# Patient Record
Sex: Female | Born: 1990 | Race: White | Hispanic: No | Marital: Married | Smoking: Current every day smoker
Health system: Southern US, Community
[De-identification: ages and names within clinical notes are randomized; demographics above are authoritative.]

## PROBLEM LIST (undated history)

## (undated) ENCOUNTER — Inpatient Hospital Stay (HOSPITAL_COMMUNITY): Payer: Self-pay

## (undated) DIAGNOSIS — F419 Anxiety disorder, unspecified: Secondary | ICD-10-CM

## (undated) DIAGNOSIS — A609 Anogenital herpesviral infection, unspecified: Secondary | ICD-10-CM

## (undated) DIAGNOSIS — N809 Endometriosis, unspecified: Secondary | ICD-10-CM

## (undated) DIAGNOSIS — F32A Depression, unspecified: Secondary | ICD-10-CM

## (undated) DIAGNOSIS — N390 Urinary tract infection, site not specified: Secondary | ICD-10-CM

## (undated) DIAGNOSIS — F4310 Post-traumatic stress disorder, unspecified: Secondary | ICD-10-CM

## (undated) DIAGNOSIS — F329 Major depressive disorder, single episode, unspecified: Secondary | ICD-10-CM

## (undated) DIAGNOSIS — F319 Bipolar disorder, unspecified: Secondary | ICD-10-CM

## (undated) HISTORY — PX: LAPAROSCOPY: SHX197

## (undated) HISTORY — PX: COLONOSCOPY: SHX174

## (undated) HISTORY — PX: DRUG INDUCED ENDOSCOPY: SHX6808

---

## 2012-11-05 HISTORY — PX: DIAGNOSTIC LAPAROSCOPY: SUR761

## 2013-07-30 ENCOUNTER — Emergency Department (HOSPITAL_COMMUNITY)
Admission: EM | Admit: 2013-07-30 | Discharge: 2013-07-30 | Disposition: A | Discharge status: Home / Self Care | Payer: BC Managed Care – PPO | Attending: Emergency Medicine | Admitting: Emergency Medicine

## 2013-07-30 ENCOUNTER — Encounter (HOSPITAL_COMMUNITY): Payer: Self-pay

## 2013-07-30 DIAGNOSIS — Z792 Long term (current) use of antibiotics: Secondary | ICD-10-CM | POA: Insufficient documentation

## 2013-07-30 DIAGNOSIS — Z79899 Other long term (current) drug therapy: Secondary | ICD-10-CM | POA: Insufficient documentation

## 2013-07-30 DIAGNOSIS — F17200 Nicotine dependence, unspecified, uncomplicated: Secondary | ICD-10-CM | POA: Insufficient documentation

## 2013-07-30 DIAGNOSIS — Z3202 Encounter for pregnancy test, result negative: Secondary | ICD-10-CM | POA: Insufficient documentation

## 2013-07-30 DIAGNOSIS — N390 Urinary tract infection, site not specified: Secondary | ICD-10-CM | POA: Insufficient documentation

## 2013-07-30 DIAGNOSIS — Z8742 Personal history of other diseases of the female genital tract: Secondary | ICD-10-CM | POA: Insufficient documentation

## 2013-07-30 HISTORY — DX: Urinary tract infection, site not specified: N39.0

## 2013-07-30 HISTORY — DX: Endometriosis, unspecified: N80.9

## 2013-07-30 LAB — BASIC METABOLIC PANEL
Calcium: 9.1 mg/dL (ref 8.4–10.5)
Creatinine, Ser: 0.74 mg/dL (ref 0.50–1.10)
GFR calc Af Amer: >90 mL/min (ref >90)
GFR calc non Af Amer: >90 mL/min (ref >90)

## 2013-07-30 LAB — URINALYSIS, ROUTINE W REFLEX MICROSCOPIC
Bilirubin Urine: NEGATIVE
Glucose, UA: NEGATIVE mg/dL
Hgb urine dipstick: NEGATIVE
Protein, ur: NEGATIVE mg/dL
Specific Gravity, Urine: 1.024 (ref 1.005–1.030)
Urobilinogen, UA: 0.2 mg/dL (ref 0.0–1.0)

## 2013-07-30 LAB — URINE MICROSCOPIC-ADD ON

## 2013-07-30 LAB — CBC WITH DIFFERENTIAL/PLATELET
Basophils Absolute: 0 10*3/uL (ref 0.0–0.1)
Basophils Relative: 1 % (ref 0–1)
Eosinophils Absolute: 0.5 10*3/uL (ref 0.0–0.7)
Eosinophils Relative: 7 % — ABNORMAL HIGH (ref 0–5)
HCT: 39.4 % (ref 36.0–46.0)
MCH: 30.5 pg (ref 26.0–34.0)
MCHC: 35 g/dL (ref 30.0–36.0)
MCV: 87 fL (ref 78.0–100.0)
Monocytes Absolute: 0.5 10*3/uL (ref 0.1–1.0)
RDW: 12 % (ref 11.5–15.5)

## 2013-07-30 LAB — POCT PREGNANCY, URINE: Preg Test, Ur: NEGATIVE

## 2013-07-30 MED ORDER — PHENAZOPYRIDINE HCL 200 MG PO TABS: 200.0000 mg | ORAL_TABLET | Freq: Three times a day (TID) | ORAL | Status: DC

## 2013-07-30 NOTE — Progress Notes (Signed)
   CARE MANAGEMENT ED NOTE 07/30/2013  Patient:  Karen [name],Karen [name]   Account Number:  [id number]  Date Initiated:  07/30/2013  Documentation initiated by:  Edd Arbour  Subjective/Objective Assessment:     Subjective/Objective Assessment Detail:     Action/Plan:   Action/Plan Detail:   [facility] ED CM noted pt without a pcp listed in EPIC.  CM spoke with pt who confirms she has bcbs coverage but no pcp Stated she recently moved to the area [facility] ED CM spoke with pt on how to obtain an in network pcp with insurance coverage via the   Anticipated DC Date:  07/30/2013     Status Recommendation to Physician:   Result of Recommendation:    Other ED Services  Consult Working Plan    DC Planning Services  Other  PCP issues  Outpatient Services - Pt will follow up    Choice offered to / List presented to:         Status of service:  Completed, signed off  ED Comments:   ED Comments Detail:  [facility] ED CM noted pt without a pcp listed in EPIC.  CM spoke with pt who confirms she has bcbs coverage but no pcp Stated she recently moved to the area [facility] ED CM spoke with pt on how to obtain an in network pcp with insurance coverage via the customer service number or web site Cm reviewed ED level of care for crisis/emergent services and community pcp level of care to manage continuous or chronic medical concerns.  The pt voiced understanding CM encouraged pt and discussed pt's responsibility to verify with pt's insurance carrier that any recommended medical provider offered by any emergency room or a hospital provider is within the carrier's network. The pt voiced understanding

## 2013-07-30 NOTE — ED Notes (Signed)
Negative CVA tenderness, pain bilateral flank. The abnormal lab that prompted urgent care to refer pt to ED was concerning for severe infection. Hematuria present as well.

## 2013-07-30 NOTE — ED Provider Notes (Signed)
CSN: [id number]     Arrival date & time 07/30/13  1135 History   First MD Initiated Contact with Patient 07/30/13 1220     Chief Complaint  Patient presents with  . Urinary Tract Infection   (Consider location/radiation/quality/duration/timing/severity/associated sxs/prior Treatment) HPI Comments: Patient presents with a chief complaint of dysuria and increased urinary frequency that has been present for the past 2 weeks.  Dysuria gradually worsening.  She was seen at Urgent Care yesterday and was diagnosed with a UTI.  She was given a IM injection of Rocephin and discharged home with a prescription for Bactrim DS, which she reports that she is taking.  She presents today because her symptoms have not improved after being on the Bactrim DS for one day.  She reports that she was told at Urgent Care to come to the ED if no improvement in symptoms.  She also reports that she was called by the staff of Urgent Care today and was told that her labs were abnormal.  She is not sure what labs were abnormal.  Patient denies fever, chills, flank pain, nausea, vomiting, or vaginal discharge.  She reports that she is not sexually active.  The history is provided by the patient.    Past Medical History  Diagnosis Date  . Chronic UTI   . Endometriosis    History reviewed. No pertinent past surgical history. History reviewed. No pertinent family history. History  Substance Use Topics  . Smoking status: Current Every Day Smoker -- 1.00 packs/day    Types: Cigarettes  . Smokeless tobacco: Never Used  . Alcohol Use: No   OB History   Grav Para Term Preterm Abortions TAB SAB Ect Mult Living                 Review of Systems  Genitourinary: Positive for dysuria and frequency.  All other systems reviewed and are negative.    Allergies  Ciprofloxacin  Home Medications   Current Outpatient Rx  Name  Route  Sig  Dispense  Refill  . norethindrone (AYGESTIN) 5 MG tablet   Oral   Take 5 mg by  mouth daily.         Marland Kitchen sulfamethoxazole-trimethoprim (BACTRIM DS) 800-160 MG per tablet   Oral   Take 1 tablet by mouth 2 (two) times daily.          BP 119/65  Pulse 81  Temp(Src) 97.9 F (36.6 C) (Oral)  Resp 16  SpO2 100% Physical Exam  Nursing note and vitals reviewed. Constitutional: She appears well-developed and well-nourished.  HENT:  Head: Normocephalic and atraumatic.  Mouth/Throat: Oropharynx is clear and moist.  Neck: Normal range of motion. Neck supple.  Cardiovascular: Normal rate, regular rhythm and normal heart sounds.   Pulmonary/Chest: Effort normal and breath sounds normal.  Abdominal: Soft. Normal appearance and bowel sounds are normal. She exhibits no distension and no mass. There is no tenderness. There is no rebound, no guarding and no CVA tenderness.  Neurological: She is alert.  Skin: Skin is warm and dry.  Psychiatric: She has a normal mood and affect.    ED Course  Procedures (including critical care time) Labs Review Labs Reviewed  URINALYSIS, ROUTINE W REFLEX MICROSCOPIC  CBC WITH DIFFERENTIAL  BASIC METABOLIC PANEL  POCT PREGNANCY, URINE   Imaging Review No results found.  MDM  No diagnosis found. Patient presenting with a chief complaint of dysuria and increased urinary frequency.  UA showing UTI.  Urine sent for culture.  Urine pregnancy negative.  Patient currently on Bactrim DS.  Patient is afebrile with no CVA tenderness.  Therefore, doubt Pyelonephritis.  Labs unremarkable.  Feel that the patient is stable for discharge.  Return precautions given.     Pascal Lux [place], PA-C 07/30/13 2146

## 2013-07-30 NOTE — ED Notes (Signed)
Pt sts she was diagnosed with a UTI and flank pain x 2 weeks ago.  Pain score 7/10.  Sts she has been seen at an Urgent care twice for same complaint.  Sts she has taken amoxicillin and was given a Rocephin injection yesterday.  Urgent care followed up today, informed her of an abnormal lab value and directed her to come to the ED, if not feeling better.

## 2013-07-31 LAB — URINE CULTURE

## 2013-08-01 NOTE — ED Provider Notes (Signed)
Medical screening examination/treatment/procedure(s) were performed by non-physician practitioner and as supervising physician I was immediately available for consultation/collaboration.   Dagmar Hait, MD 08/01/13 [phone]

## 2013-11-05 HISTORY — PX: TONSILLECTOMY: SUR1361

## 2013-12-11 ENCOUNTER — Encounter (HOSPITAL_COMMUNITY): Payer: Self-pay | Admitting: Emergency Medicine

## 2013-12-11 ENCOUNTER — Emergency Department (HOSPITAL_COMMUNITY)
Admission: EM | Admit: 2013-12-11 | Discharge: 2013-12-11 | Disposition: A | Discharge status: Home / Self Care | Payer: BC Managed Care – PPO | Source: Home / Self Care | Attending: Family Medicine | Admitting: Family Medicine

## 2013-12-11 DIAGNOSIS — E860 Dehydration: Secondary | ICD-10-CM

## 2013-12-11 DIAGNOSIS — R6889 Other general symptoms and signs: Secondary | ICD-10-CM

## 2013-12-11 DIAGNOSIS — R1110 Vomiting, unspecified: Secondary | ICD-10-CM

## 2013-12-11 DIAGNOSIS — R197 Diarrhea, unspecified: Secondary | ICD-10-CM

## 2013-12-11 LAB — POCT URINALYSIS DIP (DEVICE)
Bilirubin Urine: NEGATIVE
Glucose, UA: NEGATIVE mg/dL
Ketones, ur: NEGATIVE mg/dL
Leukocytes, UA: NEGATIVE
Nitrite: NEGATIVE
PROTEIN: NEGATIVE mg/dL
Urobilinogen, UA: 0.2 mg/dL (ref 0.0–1.0)
pH: 6.5 (ref 5.0–8.0)

## 2013-12-11 MED ORDER — SODIUM CHLORIDE 0.9 % IV BOLUS (SEPSIS)
1000.0000 mL | Freq: Once | INTRAVENOUS | Status: AC
  Administered 2013-12-11: 1000 mL via INTRAVENOUS

## 2013-12-11 MED ORDER — ONDANSETRON 4 MG PO TBDP: 4.0000 mg | ORAL_TABLET | Freq: Three times a day (TID) | ORAL | Status: DC | PRN

## 2013-12-11 MED ORDER — ONDANSETRON HCL 4 MG/2ML IJ SOLN
4.0000 mg | Freq: Four times a day (QID) | INTRAMUSCULAR | Status: DC | PRN
  Administered 2013-12-11: 4 mg via INTRAVENOUS

## 2013-12-11 MED ORDER — OSELTAMIVIR PHOSPHATE 75 MG PO CAPS: 75.0000 mg | ORAL_CAPSULE | Freq: Two times a day (BID) | ORAL | Status: DC

## 2013-12-11 MED ORDER — ONDANSETRON HCL 4 MG/2ML IJ SOLN
INTRAMUSCULAR | Status: AC
  Filled 2013-12-11: qty 2

## 2013-12-11 NOTE — Discharge Instructions (Signed)
Take Tamiflu as directed. Rest, drink plenty of liquids and use Zofran as directed for nausea. If you feel like eating keep it very bland (BRAT=Bananas, Rice, Applesauce and Toast). See other suggestions below.   Influenza, Adult Influenza (flu) is an infection in the mouth, nose, and throat (respiratory tract) caused by a virus. The flu can make you feel very ill. Influenza spreads easily from person to person (contagious).  HOME CARE   Only take medicines as told by your doctor.  Use a cool mist humidifier to make breathing easier.  Get plenty of rest until your fever goes away. This usually takes 3 to 4 days.  Drink enough fluids to keep your pee (urine) clear or pale yellow.  Cover your mouth and nose when you cough or sneeze.  Wash your hands well to avoid spreading the flu.  Stay home from work or school until your fever has been gone for at least 1 full day.  Get a flu shot every year. GET HELP RIGHT AWAY IF:   You have trouble breathing or feel short of breath.  Your skin or nails turn blue.  You have severe neck pain or stiffness.  You have a severe headache, facial pain, or earache.  Your fever gets worse or keeps coming back.  You feel sick to your stomach (nauseous), throw up (vomit), or have watery poop (diarrhea).  You have chest pain.  You have a deep cough that gets worse, or you cough up more thick spit (mucus). MAKE SURE YOU:   Understand these instructions.  Will watch your condition.  Will get help right away if you are not doing well or get worse. Document Released: 07/31/2008 Document Revised: 04/22/2012 Document Reviewed: 01/21/2012 [facility] Patient Information 2014 [place], [place].  Nausea and Vomiting Nausea means you feel sick to your stomach. Throwing up (vomiting) is a reflex where stomach contents come out of your mouth. HOME CARE   Take medicine as told by your doctor.  Do not force yourself to eat. However, you do need to drink  fluids.  If you feel like eating, eat a normal diet as told by your doctor.  Eat rice, wheat, potatoes, bread, lean meats, yogurt, fruits, and vegetables.  Avoid high-fat foods.  Drink enough fluids to keep your pee (urine) clear or pale yellow.  Ask your doctor how to replace body fluid losses (rehydrate). Signs of body fluid loss (dehydration) include:  Feeling very thirsty.  Dry lips and mouth.  Feeling dizzy.  Dark pee.  Peeing less than normal.  Feeling confused.  Fast breathing or heart rate. GET HELP RIGHT AWAY IF:   You have blood in your throw up.  You have black or bloody poop (stool).  You have a bad headache or stiff neck.  You feel confused.  You have bad belly (abdominal) pain.  You have chest pain or trouble breathing.  You do not pee at least once every 8 hours.  You have cold, clammy skin.  You keep throwing up after 24 to 48 hours.  You have a fever. MAKE SURE YOU:   Understand these instructions.  Will watch your condition.  Will get help right away if you are not doing well or get worse. Document Released: 04/09/2008 Document Revised: 01/14/2012 Document Reviewed: 03/23/2011 [facility] Patient Information 2014 [place], [place].  Dehydration, Adult Dehydration means your body does not have as much fluid as it needs. Your kidneys, brain, and heart will not work properly without the right amount of  fluids and salt.  HOME CARE  Ask your doctor how to replace body fluid losses (rehydrate).  Drink enough fluids to keep your pee (urine) clear or pale yellow.  Drink small amounts of fluids often if you feel sick to your stomach (nauseous) or throw up (vomit).  Eat like you normally do.  Avoid:  Foods or drinks high in sugar.  Bubbly (carbonated) drinks.  Juice.  Very hot or cold fluids.  Drinks with caffeine.  Fatty, greasy foods.  Alcohol.  Tobacco.  Eating too much.  Gelatin desserts.  Wash your hands to avoid  spreading germs (bacteria, viruses).  Only take medicine as told by your doctor.  Keep all doctor visits as told. GET HELP RIGHT AWAY IF:   You cannot drink something without throwing up.  You get worse even with treatment.  Your vomit has blood in it or looks greenish.  Your poop (stool) has blood in it or looks black and tarry.  You have not peed in 6 to 8 hours.  You pee a small amount of very dark pee.  You have a fever.  You pass out (faint).  You have belly (abdominal) pain that gets worse or stays in one spot (localizes).  You have a rash, stiff neck, or bad headache.  You get easily annoyed, sleepy, or are hard to wake up.  You feel weak, dizzy, or very thirsty. MAKE SURE YOU:   Understand these instructions.  Will watch your condition.  Will get help right away if you are not doing well or get worse. Document Released: 08/18/2009 Document Revised: 01/14/2012 Document Reviewed: 06/11/2011 [facility] Patient Information 2014 [place], [place].  Diarrhea Diarrhea is watery poop (stool). It can make you feel weak, tired, thirsty, or give you a dry mouth (signs of dehydration). Watery poop is a sign of another problem, most often an infection. It often lasts 2 3 days. It can last longer if it is a sign of something serious. Take care of yourself as told by your doctor. HOME CARE   Drink 1 cup (8 ounces) of fluid each time you have watery poop.  Do not drink the following fluids:  Those that contain simple sugars (fructose, glucose, galactose, lactose, sucrose, maltose).  Sports drinks.  Fruit juices.  Whole milk products.  Sodas.  Drinks with caffeine (coffee, tea, soda) or alcohol.  Oral rehydration solution may be used if the doctor says it is okay. You may make your own solution. Follow this recipe:    teaspoon table salt.   teaspoon baking soda.   teaspoon salt substitute containing potassium chloride.  1 tablespoons sugar.  1 liter (34  ounces) of water.  Avoid the following foods:  High fiber foods, such as raw fruits and vegetables.  Nuts, seeds, and whole grain breads and cereals.   Those that are sweetened with sugar alcohols (xylitol, sorbitol, mannitol).  Try eating the following foods:  Starchy foods, such as rice, toast, pasta, low-sugar cereal, oatmeal, baked potatoes, crackers, and bagels.  Bananas.  Applesauce.  Eat probiotic-rich foods, such as yogurt and milk products that are fermented.  Wash your hands well after each time you have watery poop.  Only take medicine as told by your doctor.  Take a warm bath to help lessen burning or pain from having watery poop. GET HELP RIGHT AWAY IF:   You cannot drink fluids without throwing up (vomiting).  You keep throwing up.  You have blood in your poop, or your poop looks  black and tarry.  You do not pee (urinate) in 6 8 hours, or there is only a small amount of very dark pee.  You have belly (abdominal) pain that gets worse or stays in the same spot (localizes).  You are weak, dizzy, confused, or lightheaded.  You have a very bad headache.  Your watery poop gets worse or does not get better.  You have a fever or lasting symptoms for more than 2 3 days.  You have a fever and your symptoms suddenly get worse. MAKE SURE YOU:   Understand these instructions.  Will watch your condition.  Will get help right away if you are not doing well or get worse. Document Released: 04/09/2008 Document Revised: 07/16/2012 Document Reviewed: 06/29/2012 [facility] Patient Information 2014 [place], [place].  Diet for Diarrhea, Adult Frequent, runny stools (diarrhea) may be caused or worsened by food or drink. Diarrhea may be relieved by changing your diet. Since diarrhea can last up to 7 days, it is easy for you to lose too much fluid from the body and become dehydrated. Fluids that are lost need to be replaced. Along with a modified diet, make sure you drink  enough fluids to keep your urine clear or pale yellow. DIET INSTRUCTIONS  Ensure adequate fluid intake (hydration): have 1 cup (8 oz) of fluid for each diarrhea episode. Avoid fluids that contain simple sugars or sports drinks, fruit juices, whole milk products, and sodas. Your urine should be clear or pale yellow if you are drinking enough fluids. Hydrate with an oral rehydration solution that you can purchase at pharmacies, retail stores, and online. You can prepare an oral rehydration solution at home by mixing the following ingredients together:    tsp table salt.   tsp baking soda.   tsp salt substitute containing potassium chloride.  1  tablespoons sugar.  1 L (34 oz) of water.  Certain foods and beverages may increase the speed at which food moves through the gastrointestinal (GI) tract. These foods and beverages should be avoided and include:  Caffeinated and alcoholic beverages.  High-fiber foods, such as raw fruits and vegetables, nuts, seeds, and whole grain breads and cereals.  Foods and beverages sweetened with sugar alcohols, such as xylitol, sorbitol, and mannitol.  Some foods may be well tolerated and may help thicken stool including:  Starchy foods, such as rice, toast, pasta, low-sugar cereal, oatmeal, grits, baked potatoes, crackers, and bagels.   Bananas.   Applesauce.  Add probiotic-rich foods to help increase healthy bacteria in the GI tract, such as yogurt and fermented milk products. RECOMMENDED FOODS AND BEVERAGES Starches Choose foods with less than 2 g of fiber per serving.  Recommended:  White, [place], and pita breads, plain rolls, buns, bagels. Plain muffins, matzo. Soda, saltine, or graham crackers. Pretzels, melba toast, zwieback. Cooked cereals made with water: cornmeal, farina, cream cereals. Dry cereals: refined corn, wheat, rice. Potatoes prepared any way without skins, refined macaroni, spaghetti, noodles, refined rice.  Avoid:  Bread,  rolls, or crackers made with whole wheat, multi-grains, rye, bran seeds, nuts, or coconut. Corn tortillas or taco shells. Cereals containing whole grains, multi-grains, bran, coconut, nuts, raisins. Cooked or dry oatmeal. Coarse wheat cereals, granola. Cereals advertised as "high-fiber." Potato skins. Whole grain pasta, wild or brown rice. Popcorn. Sweet potatoes, yams. Sweet rolls, doughnuts, waffles, pancakes, sweet breads. Vegetables  Recommended: Strained tomato and vegetable juices. Most well-cooked and canned vegetables without seeds. Fresh: Tender lettuce, cucumber without the skin, cabbage, spinach, bean sprouts.  Avoid: Fresh, cooked, or canned: Artichokes, baked beans, beet greens, broccoli, Brussels sprouts, corn, kale, legumes, peas, sweet potatoes. Cooked: Green or red cabbage, spinach. Avoid large servings of any vegetables because vegetables shrink when cooked, and they contain more fiber per serving than fresh vegetables. Fruit  Recommended: Cooked or canned: Apricots, applesauce, cantaloupe, cherries, fruit cocktail, grapefruit, grapes, kiwi, mandarin oranges, peaches, pears, plums, watermelon. Fresh: Apples without skin, ripe banana, grapes, cantaloupe, cherries, grapefruit, peaches, oranges, plums. Keep servings limited to  cup or 1 piece.  Avoid: Fresh: Apples with skin, apricots, mangoes, pears, raspberries, strawberries. Prune juice, stewed or dried prunes. Dried fruits, raisins, dates. Large servings of all fresh fruits. Protein  Recommended: Ground or well-cooked tender beef, ham, veal, lamb, pork, or poultry. Eggs. Fish, oysters, shrimp, lobster, other seafoods. Liver, organ meats.  Avoid: Tough, fibrous meats with gristle. Peanut butter, smooth or chunky. Cheese, nuts, seeds, legumes, dried peas, beans, lentils. Dairy  Recommended: Yogurt, lactose-free milk, kefir, drinkable yogurt, buttermilk, soy milk, or plain hard cheese.  Avoid: Milk, chocolate milk, beverages made  with milk, such as milkshakes. Soups  Recommended: Bouillon, broth, or soups made from allowed foods. Any strained soup.  Avoid: Soups made from vegetables that are not allowed, cream or milk-based soups. Desserts and Sweets  Recommended: Sugar-free gelatin, sugar-free frozen ice pops made without sugar alcohol.  Avoid: Plain cakes and cookies, pie made with fruit, pudding, custard, cream pie. Gelatin, fruit, ice, sherbet, frozen ice pops. Ice cream, ice milk without nuts. Plain hard candy, honey, jelly, molasses, syrup, sugar, chocolate syrup, gumdrops, marshmallows. Fats and Oils  Recommended: Limit fats to less than 8 tsp per day.  Avoid: Seeds, nuts, olives, avocados. Margarine, butter, cream, mayonnaise, salad oils, plain salad dressings. Plain gravy, crisp bacon without rind. Beverages  Recommended: Water, decaffeinated teas, oral rehydration solutions, sugar-free beverages not sweetened with sugar alcohols.  Avoid: Fruit juices, caffeinated beverages (coffee, tea, soda), alcohol, sports drinks, or lemon-lime soda. Condiments  Recommended: Ketchup, mustard, horseradish, vinegar, cocoa powder. Spices in moderation: allspice, basil, bay leaves, celery powder or leaves, cinnamon, cumin powder, curry powder, ginger, mace, marjoram, onion or garlic powder, oregano, paprika, parsley flakes, ground pepper, rosemary, sage, savory, tarragon, thyme, turmeric.  Avoid: Coconut, honey. Document Released: 01/12/2004 Document Revised: 07/16/2012 Document Reviewed: 03/07/2012 [facility] Patient Information 2014 [place].

## 2013-12-11 NOTE — ED Notes (Signed)
Works as a [employer] manager, w the public a lot. Was reportedly sent home from work yesterday because she could not keep down anything, and has had flu like symptoms. NAD at present

## 2013-12-11 NOTE — ED Provider Notes (Signed)
CSN: [id number]     Arrival date & time 12/11/13  1210 History   First MD Initiated Contact with Patient 12/11/13 1422     Chief Complaint  Patient presents with  . Influenza   Patient is a 23 y.o. female presenting with flu symptoms. The history is provided by the patient.  Influenza Presenting symptoms: cough, diarrhea, fatigue, headache, myalgias, nausea, sore throat and vomiting   Presenting symptoms: no fever, no rhinorrhea and no shortness of breath   Severity:  Moderate Onset quality:  Sudden Progression:  Worsening Chronicity:  New Relieved by:  Nothing Ineffective treatments:  OTC medications Associated symptoms: chills and nasal congestion   Associated symptoms: no decrease in physical activity, no ear pain, no mental status change, no neck stiffness and no witnessed syncope   Risk factors: sick contacts   Risk factors: not elderly, no diabetes problem and no kidney disease   Pt states she began to feel bad Wednesday afternoon w/ bodyaches and general malaise. Her symptoms worsened and she became very nauseated and started having multiple episodes of V/D. Reports > 10 episodes of vomiting and 2 episodes of diarrhea in the last 24 hours. She has also developed harsh dry cough, and headaches. She also reports mild sinus congestion and throat irritation from coughing and is now somewhat hoarse. She has had no known fever though has had chills. States several people where she works have had the flu. Pt has not had the flu vaccine this year.   Past Medical History  Diagnosis Date  . Chronic UTI   . Endometriosis    History reviewed. No pertinent past surgical history. History reviewed. No pertinent family history. History  Substance Use Topics  . Smoking status: Current Every Day Smoker -- 1.00 packs/day    Types: Cigarettes  . Smokeless tobacco: Never Used  . Alcohol Use: No   OB History   Grav Para Term Preterm Abortions TAB SAB Ect Mult Living                 Review of  Systems  Constitutional: Positive for chills and fatigue. Negative for fever.  HENT: Positive for congestion, sinus pressure, sore throat and voice change. Negative for ear pain, postnasal drip, rhinorrhea and trouble swallowing.   Eyes: Negative.   Respiratory: Positive for cough. Negative for shortness of breath.   Cardiovascular: Negative.  Negative for chest pain, palpitations and leg swelling.  Gastrointestinal: Positive for nausea, vomiting and diarrhea. Negative for abdominal pain.  Endocrine: Negative.   Genitourinary: Negative.   Musculoskeletal: Positive for myalgias. Negative for neck stiffness.  Skin: Negative.   Allergic/Immunologic: Negative.   Neurological: Positive for headaches.  Hematological: Negative.   Psychiatric/Behavioral: Negative.     Allergies  Ciprofloxacin  Home Medications   Current Outpatient Rx  Name  Route  Sig  Dispense  Refill  . norethindrone (AYGESTIN) 5 MG tablet   Oral   Take 5 mg by mouth daily.         . phenazopyridine (PYRIDIUM) 200 MG tablet   Oral   Take 1 tablet (200 mg total) by mouth 3 (three) times daily.   6 tablet   0   . sulfamethoxazole-trimethoprim (BACTRIM DS) 800-160 MG per tablet   Oral   Take 1 tablet by mouth 2 (two) times daily.          BP 107/67  Pulse 54  Temp(Src) 98.7 F (37.1 C) (Oral)  Resp 12  SpO2 100% Physical Exam  Constitutional: She is oriented to person, place, and time. She appears well-developed and well-nourished.  Non-toxic appearance. She has a sickly appearance. She does not appear ill. No distress.  HENT:  Head: Normocephalic and atraumatic.  Right Ear: Tympanic membrane, external ear and ear canal normal.  Left Ear: Tympanic membrane and ear canal normal.  Nose: Nose normal. Right sinus exhibits no maxillary sinus tenderness and no frontal sinus tenderness. Left sinus exhibits no maxillary sinus tenderness and no frontal sinus tenderness.  Mouth/Throat: Uvula is midline,  oropharynx is clear and moist and mucous membranes are normal.  Eyes: Conjunctivae are normal.  Neck: Neck supple.  Cardiovascular: Regular rhythm.  Bradycardia present.   W/ rate of 54  Pulmonary/Chest: Effort normal and breath sounds normal. No respiratory distress. She has no wheezes. She has no rales.  Abdominal: Soft. Bowel sounds are normal. There is no tenderness.  Musculoskeletal: Normal range of motion.  Neurological: She is alert and oriented to person, place, and time.  Skin: Skin is warm and dry.    ED Course  Procedures (including critical care time) Labs Review Labs Reviewed - No data to display Imaging Review No results found.    MDM  No diagnosis found.  Flu-like symptoms x < 48 hours that has included significant n/v/d. Pt appeared clinically dehydrated. Rec'd 1L NS here w/ Zofran was feeling better at time of d/c. Will treat w/ Tamiflu and Zofran and encourage rest and fluids. Pt agreeable w/ plan.    Jeryl Columbia, NP 12/11/13 [phone]

## 2013-12-16 NOTE — ED Provider Notes (Signed)
Medical screening examination/treatment/procedure(s) were performed by resident physician or non-physician practitioner and as supervising physician I was immediately available for consultation/collaboration.   Sarah Zerby DOUGLAS MD.   Carynn Felling D Hamsini Verrilli, MD 12/16/13 1805 

## 2014-04-01 ENCOUNTER — Emergency Department (HOSPITAL_COMMUNITY): Payer: BC Managed Care – PPO

## 2014-04-01 ENCOUNTER — Encounter (HOSPITAL_COMMUNITY): Payer: Self-pay | Admitting: Emergency Medicine

## 2014-04-01 ENCOUNTER — Emergency Department (HOSPITAL_COMMUNITY)
Admission: EM | Admit: 2014-04-01 | Discharge: 2014-04-01 | Disposition: A | Discharge status: Home / Self Care | Payer: BC Managed Care – PPO | Attending: Emergency Medicine | Admitting: Emergency Medicine

## 2014-04-01 DIAGNOSIS — Z79899 Other long term (current) drug therapy: Secondary | ICD-10-CM | POA: Insufficient documentation

## 2014-04-01 DIAGNOSIS — Y92410 Unspecified street and highway as the place of occurrence of the external cause: Secondary | ICD-10-CM | POA: Insufficient documentation

## 2014-04-01 DIAGNOSIS — Y9389 Activity, other specified: Secondary | ICD-10-CM | POA: Insufficient documentation

## 2014-04-01 DIAGNOSIS — Z87440 Personal history of urinary (tract) infections: Secondary | ICD-10-CM | POA: Insufficient documentation

## 2014-04-01 DIAGNOSIS — H9319 Tinnitus, unspecified ear: Secondary | ICD-10-CM | POA: Insufficient documentation

## 2014-04-01 DIAGNOSIS — S0990XA Unspecified injury of head, initial encounter: Secondary | ICD-10-CM

## 2014-04-01 DIAGNOSIS — Z8742 Personal history of other diseases of the female genital tract: Secondary | ICD-10-CM | POA: Insufficient documentation

## 2014-04-01 DIAGNOSIS — F17200 Nicotine dependence, unspecified, uncomplicated: Secondary | ICD-10-CM | POA: Insufficient documentation

## 2014-04-01 MED ORDER — IBUPROFEN 600 MG PO TABS: 600.0000 mg | ORAL_TABLET | Freq: Four times a day (QID) | ORAL | Status: DC | PRN
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
## 2014-04-05 NOTE — ED Provider Notes (Signed)
Medical screening examination/treatment/procedure(s) were performed by non-physician practitioner and as supervising physician I was immediately available for consultation/collaboration.   EKG Interpretation None        Tanna Furry, MD 04/05/14 1624

## 2014-04-05 NOTE — ED Provider Notes (Signed)
Medical screening examination/treatment/procedure(s) were performed by non-physician practitioner and as supervising physician I was immediately available for consultation/collaboration.   EKG Interpretation None        Tanna Furry, MD 04/05/14 2334

## 2014-08-02 ENCOUNTER — Encounter (HOSPITAL_COMMUNITY): Payer: Self-pay | Admitting: Emergency Medicine

## 2014-08-02 ENCOUNTER — Emergency Department (HOSPITAL_COMMUNITY)
Admission: EM | Admit: 2014-08-02 | Discharge: 2014-08-02 | Disposition: A | Discharge status: Home / Self Care | Payer: BC Managed Care – PPO | Source: Home / Self Care | Attending: Family Medicine | Admitting: Family Medicine

## 2014-08-02 DIAGNOSIS — N390 Urinary tract infection, site not specified: Secondary | ICD-10-CM

## 2014-08-02 LAB — POCT URINALYSIS DIP (DEVICE)
Bilirubin Urine: NEGATIVE
GLUCOSE, UA: NEGATIVE mg/dL
Hgb urine dipstick: NEGATIVE
KETONES UR: NEGATIVE mg/dL
Leukocytes, UA: NEGATIVE
Nitrite: POSITIVE — AB
PROTEIN: NEGATIVE mg/dL
SPECIFIC GRAVITY, URINE: 1.025 (ref 1.005–1.030)
Urobilinogen, UA: 0.2 mg/dL (ref 0.0–1.0)
pH: 6 (ref 5.0–8.0)

## 2014-08-02 LAB — POCT PREGNANCY, URINE: PREG TEST UR: NEGATIVE

## 2014-08-02 MED ORDER — CEPHALEXIN 500 MG PO CAPS: 500.0000 mg | ORAL_CAPSULE | Freq: Four times a day (QID) | ORAL | Status: DC

## 2014-08-02 NOTE — Discharge Instructions (Signed)
Take all of medicine as directed, drink lots of fluids, see your doctor if further problems. °

## 2014-08-02 NOTE — ED Provider Notes (Signed)
CSN: [id number]     Arrival date & time 08/02/14  1935 History   First MD Initiated Contact with Patient 08/02/14 1951     No chief complaint on file.  (Consider location/radiation/quality/duration/timing/severity/associated sxs/prior Treatment) Patient is a 23 y.o. female presenting with frequency. The history is provided by the patient.  Urinary Frequency This is a chronic problem. The current episode started more than 1 week ago (chronic problem, treated and eval by gyn and urologist, last episode was in may 2015.). The problem has been gradually worsening.    Past Medical History  Diagnosis Date  . Chronic UTI   . Endometriosis    No past surgical history on file. No family history on file. History  Substance Use Topics  . Smoking status: Current Every Day Smoker -- 1.00 packs/day    Types: Cigarettes  . Smokeless tobacco: Never Used  . Alcohol Use: No   OB History   Grav Para Term Preterm Abortions TAB SAB Ect Mult Living                 Review of Systems  Constitutional: Negative.   Gastrointestinal: Negative.   Genitourinary: Positive for dysuria, urgency, frequency and pelvic pain. Negative for vaginal discharge.    Allergies  Ciprofloxacin  Home Medications   Prior to Admission medications   Medication Sig Start Date End Date Taking? Authorizing Provider  cephALEXin (KEFLEX) 500 MG capsule Take 1 capsule (500 mg total) by mouth 4 (four) times daily. Take all of medicine and drink lots of fluids 08/02/14   Billy Fischer, MD  ibuprofen (ADVIL,MOTRIN) 600 MG tablet Take 1 tablet (600 mg total) by mouth every 6 (six) hours as needed. 04/01/14   Antonietta Breach, PA-C  norethindrone (AYGESTIN) 5 MG tablet Take 5 mg by mouth daily.    Historical Provider, MD  ondansetron (ZOFRAN ODT) 4 MG disintegrating tablet Take 1 tablet (4 mg total) by mouth every 8 (eight) hours as needed for nausea or vomiting. 12/11/13   Rhetta Mura Schorr, NP  oseltamivir (TAMIFLU) 75 MG capsule Take  1 capsule (75 mg total) by mouth every 12 (twelve) hours. 12/11/13   Rhetta Mura Schorr, NP  phenazopyridine (PYRIDIUM) 200 MG tablet Take 1 tablet (200 mg total) by mouth 3 (three) times daily. 07/30/13   Heather Laisure, PA-C  sulfamethoxazole-trimethoprim (BACTRIM DS) 800-160 MG per tablet Take 1 tablet by mouth 2 (two) times daily.    Historical Provider, MD   There were no vitals taken for this visit. Physical Exam  Nursing note and vitals reviewed. Constitutional: She is oriented to person, place, and time. She appears well-developed and well-nourished.  Abdominal: Soft. Bowel sounds are normal. She exhibits no distension and no mass. There is no tenderness. There is no rebound and no guarding.  Neurological: She is alert and oriented to person, place, and time.  Skin: Skin is warm and dry.    ED Course  Procedures (including critical care time) Labs Review Labs Reviewed  POCT URINALYSIS DIP (DEVICE) - Abnormal; Notable for the following:    Nitrite POSITIVE (*)    All other components within normal limits  POCT PREGNANCY, URINE    Imaging Review No results found.   MDM   1. UTI (lower urinary tract infection)        Billy Fischer, MD 08/02/14 2001

## 2014-08-02 NOTE — ED Notes (Signed)
UTI syx; seen by MD only

## 2014-08-05 LAB — URINE CULTURE: Special Requests: NORMAL

## 2014-08-05 NOTE — ED Notes (Signed)
Urine culture: >100,000 colonies E. Coli.  Pt. adequately treated with Keflex. Roselyn Meier 08/05/2014

## 2014-08-06 ENCOUNTER — Emergency Department (HOSPITAL_COMMUNITY): Payer: BC Managed Care – PPO

## 2014-08-06 ENCOUNTER — Encounter (HOSPITAL_COMMUNITY): Payer: Self-pay | Admitting: Emergency Medicine

## 2014-08-06 ENCOUNTER — Emergency Department (HOSPITAL_COMMUNITY)
Admission: EM | Admit: 2014-08-06 | Discharge: 2014-08-06 | Disposition: A | Discharge status: Home / Self Care | Payer: BC Managed Care – PPO | Attending: Emergency Medicine | Admitting: Emergency Medicine

## 2014-08-06 DIAGNOSIS — Z79899 Other long term (current) drug therapy: Secondary | ICD-10-CM | POA: Insufficient documentation

## 2014-08-06 DIAGNOSIS — Z720 Tobacco use: Secondary | ICD-10-CM | POA: Diagnosis not present

## 2014-08-06 DIAGNOSIS — R109 Unspecified abdominal pain: Secondary | ICD-10-CM | POA: Diagnosis present

## 2014-08-06 DIAGNOSIS — K5900 Constipation, unspecified: Secondary | ICD-10-CM

## 2014-08-06 DIAGNOSIS — Z87440 Personal history of urinary (tract) infections: Secondary | ICD-10-CM | POA: Diagnosis not present

## 2014-08-06 DIAGNOSIS — Z3202 Encounter for pregnancy test, result negative: Secondary | ICD-10-CM | POA: Insufficient documentation

## 2014-08-06 DIAGNOSIS — B9689 Other specified bacterial agents as the cause of diseases classified elsewhere: Secondary | ICD-10-CM

## 2014-08-06 DIAGNOSIS — N760 Acute vaginitis: Secondary | ICD-10-CM | POA: Insufficient documentation

## 2014-08-06 DIAGNOSIS — Z792 Long term (current) use of antibiotics: Secondary | ICD-10-CM | POA: Diagnosis not present

## 2014-08-06 LAB — CBC WITH DIFFERENTIAL/PLATELET
BASOS ABS: 0 10*3/uL (ref 0.0–0.1)
BASOS PCT: 0 % (ref 0–1)
EOS PCT: 3 % (ref 0–5)
Eosinophils Absolute: 0.4 10*3/uL (ref 0.0–0.7)
HCT: 42.1 % (ref 36.0–46.0)
Hemoglobin: 14.9 g/dL (ref 12.0–15.0)
Lymphocytes Relative: 12 % (ref 12–46)
Lymphs Abs: 1.9 10*3/uL (ref 0.7–4.0)
MCH: 30.8 pg (ref 26.0–34.0)
MCHC: 35.4 g/dL (ref 30.0–36.0)
MCV: 87.2 fL (ref 78.0–100.0)
Monocytes Absolute: 1.2 10*3/uL — ABNORMAL HIGH (ref 0.1–1.0)
Monocytes Relative: 8 % (ref 3–12)
Neutro Abs: 11.8 10*3/uL — ABNORMAL HIGH (ref 1.7–7.7)
Neutrophils Relative %: 77 % (ref 43–77)
Platelets: 291 10*3/uL (ref 150–400)
RBC: 4.83 MIL/uL (ref 3.87–5.11)
RDW: 11.7 % (ref 11.5–15.5)
WBC: 15.3 10*3/uL — ABNORMAL HIGH (ref 4.0–10.5)

## 2014-08-06 LAB — RAPID URINE DRUG SCREEN, HOSP PERFORMED
Amphetamines: NOT DETECTED
BARBITURATES: NOT DETECTED
BENZODIAZEPINES: NOT DETECTED
COCAINE: NOT DETECTED
Opiates: NOT DETECTED
Tetrahydrocannabinol: NOT DETECTED

## 2014-08-06 LAB — URINALYSIS, ROUTINE W REFLEX MICROSCOPIC
BILIRUBIN URINE: NEGATIVE
Glucose, UA: NEGATIVE mg/dL
HGB URINE DIPSTICK: NEGATIVE
Ketones, ur: NEGATIVE mg/dL
Leukocytes, UA: NEGATIVE
Nitrite: NEGATIVE
Protein, ur: NEGATIVE mg/dL
Specific Gravity, Urine: 1.027 (ref 1.005–1.030)
UROBILINOGEN UA: 0.2 mg/dL (ref 0.0–1.0)
pH: 5 (ref 5.0–8.0)

## 2014-08-06 LAB — COMPREHENSIVE METABOLIC PANEL
ALT: 26 U/L (ref 0–35)
AST: 27 U/L (ref 0–37)
Albumin: 4.5 g/dL (ref 3.5–5.2)
Alkaline Phosphatase: 66 U/L (ref 39–117)
Anion gap: 13 (ref 5–15)
BUN: 13 mg/dL (ref 6–23)
CO2: 22 mEq/L (ref 19–32)
Calcium: 9.1 mg/dL (ref 8.4–10.5)
Chloride: 103 mEq/L (ref 96–112)
Creatinine, Ser: 0.56 mg/dL (ref 0.50–1.10)
GFR calc non Af Amer: >90 mL/min (ref >90)
Glucose, Bld: 94 mg/dL (ref 70–99)
Potassium: 4.6 mEq/L (ref 3.7–5.3)
SODIUM: 138 meq/L (ref 137–147)
TOTAL PROTEIN: 7.7 g/dL (ref 6.0–8.3)
Total Bilirubin: 0.6 mg/dL (ref 0.3–1.2)

## 2014-08-06 LAB — WET PREP, GENITAL
Trich, Wet Prep: NONE SEEN
Yeast Wet Prep HPF POC: NONE SEEN

## 2014-08-06 LAB — POC URINE PREG, ED: Preg Test, Ur: NEGATIVE

## 2014-08-06 LAB — LIPASE, BLOOD: Lipase: 16 U/L (ref 11–59)

## 2014-08-06 MED ORDER — IOHEXOL 300 MG/ML  SOLN
50.0000 mL | Freq: Once | INTRAMUSCULAR | Status: AC | PRN
  Administered 2014-08-06: 50 mL via ORAL

## 2014-08-06 MED ORDER — IOHEXOL 300 MG/ML  SOLN
80.0000 mL | Freq: Once | INTRAMUSCULAR | Status: AC | PRN
  Administered 2014-08-06: 80 mL via INTRAVENOUS

## 2014-08-06 MED ORDER — METRONIDAZOLE 500 MG PO TABS: 500.0000 mg | ORAL_TABLET | Freq: Two times a day (BID) | ORAL | Status: DC

## 2014-08-06 MED ORDER — SODIUM CHLORIDE 0.9 % IV BOLUS (SEPSIS): 1000.0000 mL | Freq: Once | INTRAVENOUS | Status: DC

## 2014-08-06 MED ORDER — ONDANSETRON HCL 4 MG PO TABS: 4.0000 mg | ORAL_TABLET | Freq: Three times a day (TID) | ORAL | Status: DC | PRN

## 2014-08-06 MED ORDER — ONDANSETRON HCL 4 MG/2ML IJ SOLN: 4.0000 mg | Freq: Once | INTRAMUSCULAR | Status: DC

## 2014-08-06 MED ORDER — ONDANSETRON 4 MG PO TBDP
4.0000 mg | ORAL_TABLET | Freq: Once | ORAL | Status: AC
  Administered 2014-08-06: 4 mg via ORAL
  Filled 2014-08-06: qty 1

## 2014-08-06 MED ORDER — SODIUM CHLORIDE 0.9 % IV BOLUS (SEPSIS)
1000.0000 mL | Freq: Once | INTRAVENOUS | Status: AC
  Administered 2014-08-06: 1000 mL via INTRAVENOUS

## 2014-08-06 MED ORDER — ONDANSETRON HCL 4 MG/2ML IJ SOLN
4.0000 mg | Freq: Once | INTRAMUSCULAR | Status: DC
  Filled 2014-08-06: qty 2

## 2014-08-06 NOTE — ED Notes (Signed)
IV team paged.  

## 2014-08-06 NOTE — Discharge Instructions (Signed)
Please call your doctor for a followup appointment within 24-48 hours. When you talk to your doctor please let them know that you were seen in the emergency department and have them acquire all of your records so that they can discuss the findings with you and formulate a treatment plan to fully care for your new and ongoing problems. Please call and set-up an appointment with [facility]  Please rest and stay hydrated Please increase fiber intake for constipation  If abdominal pain continues within 24-48 hours please report back to the ED immediately  Please continue to monitor symptoms closely and if symptoms are to worsen or change (fever greater than 101, chills, sweating, nausea, vomiting, chest pain, shortness of breathe, difficulty breathing, weakness, numbness, tingling, worsening or changes to pain pattern, blood in the stools, black tarry stools, inability to keep any food or fluids down) please report back to the Emergency Department immediately.    Abdominal Pain, Women Abdominal (stomach, pelvic, or belly) pain can be caused by many things. It is important to tell your doctor:  The location of the pain.  Does it come and go or is it present all the time?  Are there things that start the pain (eating certain foods, exercise)?  Are there other symptoms associated with the pain (fever, nausea, vomiting, diarrhea)? All of this is helpful to know when trying to find the cause of the pain. CAUSES   Stomach: virus or bacteria infection, or ulcer.  Intestine: appendicitis (inflamed appendix), regional ileitis (Crohn's disease), ulcerative colitis (inflamed colon), irritable bowel syndrome, diverticulitis (inflamed diverticulum of the colon), or cancer of the stomach or intestine.  Gallbladder disease or stones in the gallbladder.  Kidney disease, kidney stones, or infection.  Pancreas infection or cancer.  Fibromyalgia (pain disorder).  Diseases of the female  organs:  Uterus: fibroid (non-cancerous) tumors or infection.  Fallopian tubes: infection or tubal pregnancy.  Ovary: cysts or tumors.  Pelvic adhesions (scar tissue).  Endometriosis (uterus lining tissue growing in the pelvis and on the pelvic organs).  Pelvic congestion syndrome (female organs filling up with blood just before the menstrual period).  Pain with the menstrual period.  Pain with ovulation (producing an egg).  Pain with an IUD (intrauterine device, birth control) in the uterus.  Cancer of the female organs.  Functional pain (pain not caused by a disease, may improve without treatment).  Psychological pain.  Depression. DIAGNOSIS  Your doctor will decide the seriousness of your pain by doing an examination.  Blood tests.  X-rays.  Ultrasound.  CT scan (computed tomography, special type of X-ray).  MRI (magnetic resonance imaging).  Cultures, for infection.  Barium enema (dye inserted in the large intestine, to better view it with X-rays).  Colonoscopy (looking in intestine with a lighted tube).  Laparoscopy (minor surgery, looking in abdomen with a lighted tube).  Major abdominal exploratory surgery (looking in abdomen with a large incision). TREATMENT  The treatment will depend on the cause of the pain.   Many cases can be observed and treated at home.  Over-the-counter medicines recommended by your caregiver.  Prescription medicine.  Antibiotics, for infection.  Birth control pills, for painful periods or for ovulation pain.  Hormone treatment, for endometriosis.  Nerve blocking injections.  Physical therapy.  Antidepressants.  Counseling with a psychologist or psychiatrist.  Minor or major surgery. HOME CARE INSTRUCTIONS   Do not take laxatives, unless directed by your caregiver.  Take over-the-counter pain medicine only if ordered by  your caregiver. Do not take aspirin because it can cause an upset stomach or  bleeding.  Try a clear liquid diet (broth or water) as ordered by your caregiver. Slowly move to a bland diet, as tolerated, if the pain is related to the stomach or intestine.  Have a thermometer and take your temperature several times a day, and record it.  Bed rest and sleep, if it helps the pain.  Avoid sexual intercourse, if it causes pain.  Avoid stressful situations.  Keep your follow-up appointments and tests, as your caregiver orders.  If the pain does not go away with medicine or surgery, you may try:  Acupuncture.  Relaxation exercises (yoga, meditation).  Group therapy.  Counseling. SEEK MEDICAL CARE IF:   You notice certain foods cause stomach pain.  Your home care treatment is not helping your pain.  You need stronger pain medicine.  You want your IUD removed.  You feel faint or lightheaded.  You develop nausea and vomiting.  You develop a rash.  You are having side effects or an allergy to your medicine. SEEK IMMEDIATE MEDICAL CARE IF:   Your pain does not go away or gets worse.  You have a fever.  Your pain is felt only in portions of the abdomen. The right side could possibly be appendicitis. The left lower portion of the abdomen could be colitis or diverticulitis.  You are passing blood in your stools (bright red or black tarry stools, with or without vomiting).  You have blood in your urine.  You develop chills, with or without a fever.  You pass out. MAKE SURE YOU:   Understand these instructions.  Will watch your condition.  Will get help right away if you are not doing well or get worse. Document Released: 08/19/2007 Document Revised: 03/08/2014 Document Reviewed: 09/08/2009 [facility] Patient Information 2015 [place], [place]. This information is not intended to replace advice given to you by your health care provider. Make sure you discuss any questions you have with your health care provider. High-Fiber Diet Fiber is found in  fruits, vegetables, and grains. A high-fiber diet encourages the addition of more whole grains, legumes, fruits, and vegetables in your diet. The recommended amount of fiber for adult males is 38 g per day. For adult females, it is 25 g per day. Pregnant and lactating women should get 28 g of fiber per day. If you have a digestive or bowel problem, ask your caregiver for advice before adding high-fiber foods to your diet. Eat a variety of high-fiber foods instead of only a select few type of foods.  PURPOSE  To increase stool bulk.  To make bowel movements more regular to prevent constipation.  To lower cholesterol.  To prevent overeating. WHEN IS THIS DIET USED?  It may be used if you have constipation and hemorrhoids.  It may be used if you have uncomplicated diverticulosis (intestine condition) and irritable bowel syndrome.  It may be used if you need help with weight management.  It may be used if you want to add it to your diet as a protective measure against atherosclerosis, diabetes, and cancer. SOURCES OF FIBER  Whole-grain breads and cereals.  Fruits, such as apples, oranges, bananas, berries, prunes, and pears.  Vegetables, such as green peas, carrots, sweet potatoes, beets, broccoli, cabbage, spinach, and artichokes.  Legumes, such split peas, soy, lentils.  Almonds. FIBER CONTENT IN FOODS Starches and Grains / Dietary Fiber (g)  Cheerios, 1 cup / 3 g  Corn  Flakes cereal, 1 cup / 0.7 g  Rice crispy treat cereal, 1 cup / 0.3 g  Instant oatmeal (cooked),  cup / 2 g  Frosted wheat cereal, 1 cup / 5.1 g  Brown, long-grain rice (cooked), 1 cup / 3.5 g  White, long-grain rice (cooked), 1 cup / 0.6 g  Enriched macaroni (cooked), 1 cup / 2.5 g Legumes / Dietary Fiber (g)  Baked beans (canned, plain, or vegetarian),  cup / 5.2 g  Kidney beans (canned),  cup / 6.8 g  Pinto beans (cooked),  cup / 5.5 g Breads and Crackers / Dietary Fiber (g)  Plain or  honey graham crackers, 2 squares / 0.7 g  Saltine crackers, 3 squares / 0.3 g  Plain, salted pretzels, 10 pieces / 1.8 g  Whole-wheat bread, 1 slice / 1.9 g  White bread, 1 slice / 0.7 g  Raisin bread, 1 slice / 1.2 g  Plain bagel, 3 oz / 2 g  Flour tortilla, 1 oz / 0.9 g  Corn tortilla, 1 small / 1.5 g  Hamburger or hotdog bun, 1 small / 0.9 g Fruits / Dietary Fiber (g)  Apple with skin, 1 medium / 4.4 g  Sweetened applesauce,  cup / 1.5 g  Banana,  medium / 1.5 g  Grapes, 10 grapes / 0.4 g  Orange, 1 small / 2.3 g  Raisin, 1.5 oz / 1.6 g  Melon, 1 cup / 1.4 g Vegetables / Dietary Fiber (g)  Green beans (canned),  cup / 1.3 g  Carrots (cooked),  cup / 2.3 g  Broccoli (cooked),  cup / 2.8 g  Peas (cooked),  cup / 4.4 g  Mashed potatoes,  cup / 1.6 g  Lettuce, 1 cup / 0.5 g  Corn (canned),  cup / 1.6 g  Tomato,  cup / 1.1 g Document Released: 10/22/2005 Document Revised: 04/22/2012 Document Reviewed: 01/24/2012 ExitCare Patient Information 2015 [place], [place]. This information is not intended to replace advice given to you by your health care provider. Make sure you discuss any questions you have with your health care provider. Constipation Constipation is when a person has fewer than three bowel movements a week, has difficulty having a bowel movement, or has stools that are dry, hard, or larger than normal. As people grow older, constipation is more common. If you try to fix constipation with medicines that make you have a bowel movement (laxatives), the problem may get worse. Long-term laxative use may cause the muscles of the colon to become weak. A low-fiber diet, not taking in enough fluids, and taking certain medicines may make constipation worse.  CAUSES   Certain medicines, such as antidepressants, pain medicine, iron supplements, antacids, and water pills.   Certain diseases, such as diabetes, irritable bowel syndrome (IBS), thyroid  disease, or depression.   Not drinking enough water.   Not eating enough fiber-rich foods.   Stress or travel.   Lack of physical activity or exercise.   Ignoring the urge to have a bowel movement.   Using laxatives too much.  SIGNS AND SYMPTOMS   Having fewer than three bowel movements a week.   Straining to have a bowel movement.   Having stools that are hard, dry, or larger than normal.   Feeling full or bloated.   Pain in the lower abdomen.   Not feeling relief after having a bowel movement.  DIAGNOSIS  Your health care provider will take a medical history and perform a physical exam.  Further testing may be done for severe constipation. Some tests may include:  A barium enema X-ray to examine your rectum, colon, and, sometimes, your small intestine.   A sigmoidoscopy to examine your lower colon.   A colonoscopy to examine your entire colon. TREATMENT  Treatment will depend on the severity of your constipation and what is causing it. Some dietary treatments include drinking more fluids and eating more fiber-rich foods. Lifestyle treatments may include regular exercise. If these diet and lifestyle recommendations do not help, your health care provider may recommend taking over-the-counter laxative medicines to help you have bowel movements. Prescription medicines may be prescribed if over-the-counter medicines do not work.  HOME CARE INSTRUCTIONS   Eat foods that have a lot of fiber, such as fruits, vegetables, whole grains, and beans.  Limit foods high in fat and processed sugars, such as french fries, hamburgers, cookies, candies, and soda.   A fiber supplement may be added to your diet if you cannot get enough fiber from foods.   Drink enough fluids to keep your urine clear or pale yellow.   Exercise regularly or as directed by your health care provider.   Go to the restroom when you have the urge to go. Do not hold it.   Only take  over-the-counter or prescription medicines as directed by your health care provider. Do not take other medicines for constipation without talking to your health care provider first.  [place] IF:   You have bright red blood in your stool.   Your constipation lasts for more than 4 days or gets worse.   You have abdominal or rectal pain.   You have thin, pencil-like stools.   You have unexplained weight loss. MAKE SURE YOU:   Understand these instructions.  Will watch your condition.  Will get help right away if you are not doing well or get worse. Document Released: 07/20/2004 Document Revised: 10/27/2013 Document Reviewed: 08/03/2013 [facility] Patient Information 2015 [place], [place]. This information is not intended to replace advice given to you by your health care provider. Make sure you discuss any questions you have with your health care provider.   Emergency Department Resource Guide 1) Find a Doctor and Pay Out of Pocket Although you won't have to find out who is covered by your insurance plan, it is a good idea to ask around and get recommendations. You will then need to call the office and see if the doctor you have chosen will accept you as a new patient and what types of options they offer for patients who are self-pay. Some doctors offer discounts or will set up payment plans for their patients who do not have insurance, but you will need to ask so you aren't surprised when you get to your appointment.  2) Contact Your Local Health Department Not all health departments have doctors that can see patients for sick visits, but many do, so it is worth a call to see if yours does. If you don't know where your local health department is, you can check in your phone book. The CDC also has a tool to help you locate your state's health department, and many state websites also have listings of all of their local health departments.  3) Find a [facility] If  your illness is not likely to be very severe or complicated, you may want to try a walk in clinic. These are popping up all over the country in pharmacies, drugstores, and shopping centers.  They're usually staffed by nurse practitioners or physician assistants that have been trained to treat common illnesses and complaints. They're usually fairly quick and inexpensive. However, if you have serious medical issues or chronic medical problems, these are probably not your best option.  No Primary Care Doctor: - Call Health Connect at  [phone] - they can help you locate a primary care doctor that  accepts your insurance, provides certain services, etc. - Physician Referral Service- [phone]  Chronic Pain Problems: Organization         Address  Phone   Notes  [facility]  [phone] Patients need to be referred by their primary care doctor.   Medication Assistance: Organization         Address  Phone   Notes  [place] Medication [facility] [address] [place], [place] [address] [phone] --Must be a resident of [facility] -- Must have NO insurance coverage whatsoever (no Medicaid/ Medicare, etc.) -- The pt. MUST have a primary care doctor that directs their care regularly and follows them in the community   MedAssist  [phone]   [employer]  [phone]    Agencies that provide inexpensive medical care: Organization         Address  Phone   Notes  [place]  [phone]   [facility]    [phone]   [facility] [place],  [address] [phone]   [place] [address], [place] [phone]   Planned Parenthood    [phone]   [facility]    [phone]   [place] and [place] [address], [place] Phone:  [phone], Fax:  [phone] Hours of  Operation:  9 am - 6 pm, M-F.  Also accepts Medicaid/Medicare and self-pay.  [facility] for [place] [employer], [address], [place] Phone: [phone], Fax: [phone]. Hours of Operation:  8:30 am - 5:30 pm, M-F.  Also accepts Medicaid and self-pay.  [facility] [place] [address], [place] Phone: [phone]   [place], [place], [place] [phone], Ext. [phone] Mondays & Thursdays: 7-9 AM.  First 15 patients are seen on a first come, first serve basis.    [place] Providers:  Organization         Address  Phone   Notes  [facility] [address], [place] [phone] Also accepts self-pay patients.  [facility] [address], [place]  [phone]   [place], [address], [place] [phone]   [facility] [address] [place] [phone]   Lucianne Lei [address], [place]   [phone] Only accepts [place] Access [place] patients after they have their name applied to their card.   Self-Pay (no insurance) in [facility]:  Organization         Address  Phone   Notes  Sickle Cell Patients, [facility] Internal [facility] [place] [phone]   [facility] Urgent Care [place] [phone]   [facility]gent [place]  [place], [address], [place] [phone]   [facility]/Dr. Osei-Bonsu  [address] [place] or [address], [place] [phone] Phone number for both [place] and [place] locations is the same.  Urgent Medical and [facility] [address], [place] [phone]   [facility] [address], [place] or [address] [phone]   [facility] [address], [place] [phone], phone; [phone], fax Sees patients 1st and 3rd Saturday of every month.  Must not qualify for public or private insurance (i.e. Medicaid, Medicare, [place] Health Choice, Veterans' Benefits)  Household income should be no more than 200% of the poverty level The clinic cannot treat you if you are pregnant or think you are pregnant  Sexually transmitted diseases are not treated at the clinic.    Dental Care: Organization         Address  Phone  Notes  [facility] of [facility] [place] [phone] Accepts children up to age 24 who are enrolled in [place] or [place]; pregnant women with a Medicaid card; and children who have applied for Medicaid or [place] Health Choice, but were declined, whose parents can pay a reduced fee at time of service.  [facility] Department of [facility]  [address], [place] [phone] Accepts children up to age 66 who are enrolled in [place] or [place]; pregnant women with a Medicaid card; and children who have applied for Medicaid or [place] Health Choice, but were declined, whose parents can pay a reduced fee at time of service.  [place] Adult Dental Access PROGRAM  [place] [phone] Patients are seen by appointment only. Walk-ins are not accepted. [place] will see patients 44 years of age and older. Monday - Tuesday (8am-5pm) Most Wednesdays (8:30-5pm) $30 per visit, cash only  [facility] Adult Dental Access PROGRAM  [address], [facility] [phone] Patients are seen by appointment only. Walk-ins are not accepted. [place] will see patients 15 years of age and older. One Wednesday Evening (Monthly: Volunteer Based).  $30 per visit, cash only  [place]  [phone] for adults; Children under age 69, call Graduate Pediatric  Dentistry at [phone]. Children aged 75-14, please call [phone] to request a pediatric application.  Dental services are provided in all areas of dental care including fillings, crowns and bridges, complete and partial dentures, implants, gum treatment, root canals, and extractions. Preventive care is also provided. Treatment is provided to both adults and children. Patients are selected via a lottery and there is often a waiting list.   [facility] [address] [place]  [phone] [email or web address]   [facility] [address] [place], [place] [phone], Ext. [phone] Second and Fourth Thursday of each month, opens at 6:30 AM; Clinic ends at 9 AM.  Patients are seen on a first-come first-served basis, and a limited number are seen during each clinic.   [facility]  [address] Hillard Danker [place], [place] [phone]   Eligibility Requirements You must have lived in [place], [place], or [place] counties for at least the last three months.   You cannot be eligible for state or federal sponsored [employer], including [employer], [place], or [employer].   You generally cannot be eligible for healthcare insurance through  your employer.    How to apply: Eligibility screenings are held every Tuesday and Wednesday afternoon from 1:00 pm until 4:00 pm. You do not need an appointment for the interview!  [facility] [address] [place], [place]   [place]  [place] Department  [place]  [phone]    Behavioral Health Resources in the Community: Intensive Outpatient Programs Organization         Address  Phone  Notes  [place]. [address] [place], [place] [phone]   [facility] [address] [place], [place]   ADS:  Alcohol & Drug Svcs [address], [place], [place]   [place] [address],  [place], [place] or [phone]   Substance Abuse Resources Organization         Address  Phone  Notes  [facility] Drug Services  [phone]   [place]  [phone]   [facility] [place]   Chinita Pester  [phone]   Residential & Outpatient Substance Abuse Program  [phone]   Psychological Services Organization         Address  Phone  Notes  [facility] [place]  [place]  [phone]   [place] [address] [place] or [phone]    Mobile Crisis Teams Organization         Address  Phone  Notes  [facility], Mobile Crisis Care Unit  [phone]   [facility] Psychotherapeutic [facility]  [address] [place], [place]   [place] [address], [place] [phone]    Self-Help/Support Groups Organization         Address  Phone             Notes  [place]. of [place] - variety of support groups  [place] Call for more information  Narcotics Anonymous (NA), [facility] [address], [employer] [place]  2 meetings at this location   Special educational needs teacher         Address  Phone  Notes  ASAP Residential Treatment [place],    [place]  [phone]   [facility]  [address] [place] [id number], [place], [place]   [place], [place] [phone] Admissions: 8am-3pm M-F  Incentives Substance [place] [address],    [place], [place] [phone]   [facility] [address] [place], [place], [place]   [facility] [address],  [place], [place]   [facility] - Intensive Outpatient [address]een [address],  [place], [place]   [facility] ([place] [address].,  [place], [place] [phone] or [phone]   [facility] Services (RTS) [address], [place], [place] Accepts Medicaid  [facility] [place] [address]  [place] [phone] Substance Abuse/Addiction Treatment   [facility] Organization         Address  Phone  Notes  [facility]  [phone]   Domenic Schwab, PhD [address] [place], [place]   [phone] or [phone]   [place], [place] [phone]  [facility] [address], [place], [place] [phone] Insurance/Medicaid/sponsorship through [facility]  °[facility] [address]                                    [place], [place] [phone] Therapy/tele-psych/case  °[facility] [address]  ° [place], [place] [phone]    °Dr. Arfeen  [phone]   °[facility] [place]  [employer] [place] [facility] 1) [address], [place] °2) [address], [place] °3)  [address], [place] [phone] °([phone]) [phone] ° °([phone]) [phone]   °[place] Child Abuse Hotline ([phone]) [phone] or ([phone]) [phone] (After Hours)    ° ° ° °

## 2014-08-06 NOTE — ED Notes (Signed)
Patient informed of orders for PIV access and labs Patient became anxious and restless while lying on the stretcher Patient then stated to this nurse, "You're going to have to go in my hands, because I don't have shit in my arms." Patient became noticeably more anxious while looking for optimal PIV site This nurse then left the patient room to get a smaller gauge IV ED tech returned to patient's room with this nurse to help locate optimal PIV site Venipuncture with 22 gauge achieved when patient began to tense up and pulled right arm back towards her Patient then began glaring at this nurse This nurse immediately left room and informed charge nurse and EDP of the above

## 2014-08-06 NOTE — ED Provider Notes (Signed)
CSN: [id number]     Arrival date & time 08/06/14  1508 History   First MD Initiated Contact with Patient 08/06/14 1522     Chief Complaint  Patient presents with  . Abdominal Pain  . Emesis     (Consider location/radiation/quality/duration/timing/severity/associated sxs/prior Treatment) The history is provided by the patient. No language interpreter was used.  Karen [name] is a 23 y/o F with PMHx of chronic UTI and endometriosis with laparoscopic surgery performed in January 2013, presenting to the ED with abdominal pain that started this morning at approximately 11:30 AM this morning. Patient reported that the abdominal pain started in her upper epigastric region at first, but then became generalized described as a constant pain with sharp intermittent discomfort with radiation to the lower back described as a sharp pain, dull aching. Reported that she has been having nausea and emesis, reported at least 3 episodes of emesis today - NB/NB. Stated that she has been having vaginal discomfort with some vaginal discharge - stated that she has been having this vaginal discomfort since she started on her birth control. Reported that she is currently on Lupron shots and Aygestin. Stated that she is sexually active and uses protection. Patient also reported that she is currently being treated for a UTI - Keflex and Bactrim, reported that she was diagnosed with UTI on 08/02/2014 at Urgent Care. Stated that she does not want narcotics - stated that she is 60 days clean. Denied fever, chills, hematuria, diarrhea, fainting, hematochezia, chest pain, shortness of breath, difficulty breathing, sick contacts. [facility]  Past Medical History  Diagnosis Date  . Chronic UTI   . Endometriosis    History reviewed. No pertinent past surgical history. History reviewed. No pertinent family history. History  Substance Use Topics  . Smoking status: Current Every Day Smoker -- 1.00 packs/day     Types: Cigarettes  . Smokeless tobacco: Never Used  . Alcohol Use: No   OB History   Grav Para Term Preterm Abortions TAB SAB Ect Mult Living                 Review of Systems  Constitutional: Positive for chills. Negative for fever.  Respiratory: Negative for chest tightness and shortness of breath.   Cardiovascular: Negative for chest pain.  Gastrointestinal: Positive for nausea, vomiting and abdominal pain. Negative for diarrhea, constipation, blood in stool and anal bleeding.  Genitourinary: Positive for dysuria, vaginal discharge and vaginal pain. Negative for urgency, decreased urine volume and vaginal bleeding.  Musculoskeletal: Positive for back pain. Negative for neck pain.  Neurological: Negative for weakness.      Allergies  Ciprofloxacin  Home Medications   Prior to Admission medications   Medication Sig Start Date End Date Taking? Authorizing Provider  cephALEXin (KEFLEX) 500 MG capsule Take 1 capsule (500 mg total) by mouth 4 (four) times daily. Take all of medicine and drink lots of fluids 08/02/14  Yes Billy Fischer, MD  ibuprofen (ADVIL,MOTRIN) 600 MG tablet Take 1,200 mg by mouth every 6 (six) hours as needed for mild pain, moderate pain or cramping (cramping).   Yes Historical Provider, MD  norethindrone (AYGESTIN) 5 MG tablet Take 5 mg by mouth daily.   Yes Historical Provider, MD  metroNIDAZOLE (FLAGYL) 500 MG tablet Take 1 tablet (500 mg total) by mouth 2 (two) times daily. 08/06/14   Suliman Termini, PA-C  ondansetron (ZOFRAN) 4 MG tablet Take 1 tablet (4 mg total) by mouth every 8 (eight)  hours as needed for nausea or vomiting. 08/06/14   Jessejames Steelman, PA-C   BP 91/45  Pulse 69  Temp(Src) 98.3 F (36.8 C) (Oral)  Resp 20  SpO2 96% Physical Exam  Nursing note and vitals reviewed. Constitutional: She is oriented to person, place, and time. She appears well-developed and well-nourished. No distress.  HENT:  Head: Normocephalic and atraumatic.   Mouth/Throat: Oropharynx is clear and moist. No oropharyngeal exudate.  Eyes: Conjunctivae and EOM are normal. Right eye exhibits no discharge. Left eye exhibits no discharge.  Neck: Normal range of motion. Neck supple. No tracheal deviation present.  Cardiovascular: Normal rate, regular rhythm and normal heart sounds.  Exam reveals no friction rub.   No murmur heard. Pulses:      Radial pulses are 2+ on the right side, and 2+ on the left side.  Pulmonary/Chest: Effort normal and breath sounds normal. No respiratory distress. She has no wheezes. She has no rales.  Abdominal: Soft. Normal appearance and bowel sounds are normal. She exhibits no distension. There is generalized tenderness. There is guarding. There is no rebound.  Negative abdominal distention Bowel sounds normal active in all 4 quadrants Abdomen soft upon palpation Discomfort upon palpation to the abdominal wall-generalized. Most discomfort upon palpation to the right lower quadrant. Voluntary guarding upon examination Negative peritoneal signs  Genitourinary: Vaginal discharge found.  Pelvic exam: negative swelling, erythema, inflammation, lesions, sores, deformities noted to the external genitalia and vaginal canal. Negative lesions, sores, polyps noted. Thick white discharge identified negative odor. Cervix identified negative friability. Negative CMT. Negative left adnexal tenderness. Right adnexal tenderness noted on exam. Exam chaperoned with tech  Musculoskeletal: Normal range of motion. She exhibits no tenderness.  Full ROM to upper and lower extremities without difficulty noted, negative ataxia noted.  Lymphadenopathy:    She has no cervical adenopathy.  Neurological: She is alert and oriented to person, place, and time. No cranial nerve deficit. She exhibits normal muscle tone. Coordination normal.  Skin: Skin is warm and dry. No rash noted. She is not diaphoretic. No erythema.  Psychiatric: She has a normal mood and  affect. Her behavior is normal. Thought content normal.    ED Course  Procedures (including critical care time)  7:49 PM This provider re-assessed the patient. Patient reported that her abdominal pain has improved. Reported that she is feeling better and would like to go home. Patient able to tolerate fluids PO without difficulty, negative episodes of emesis while in the ED setting.   Results for orders placed during the hospital encounter of 08/06/14  WET PREP, GENITAL      Result Value Ref Range   Yeast Wet Prep HPF POC NONE SEEN  NONE SEEN   Trich, Wet Prep NONE SEEN  NONE SEEN   Clue Cells Wet Prep HPF POC FEW (*) NONE SEEN   WBC, Wet Prep HPF POC FEW (*) NONE SEEN  CBC WITH DIFFERENTIAL      Result Value Ref Range   WBC 15.3 (*) 4.0 - 10.5 K/uL   RBC 4.83  3.87 - 5.11 MIL/uL   Hemoglobin 14.9  12.0 - 15.0 g/dL   HCT 42.1  36.0 - 46.0 %   MCV 87.2  78.0 - 100.0 fL   MCH 30.8  26.0 - 34.0 pg   MCHC 35.4  30.0 - 36.0 g/dL   RDW 11.7  11.5 - 15.5 %   Platelets 291  150 - 400 K/uL   Neutrophils Relative % 77  43 -  77 %   Neutro Abs 11.8 (*) 1.7 - 7.7 K/uL   Lymphocytes Relative 12  12 - 46 %   Lymphs Abs 1.9  0.7 - 4.0 K/uL   Monocytes Relative 8  3 - 12 %   Monocytes Absolute 1.2 (*) 0.1 - 1.0 K/uL   Eosinophils Relative 3  0 - 5 %   Eosinophils Absolute 0.4  0.0 - 0.7 K/uL   Basophils Relative 0  0 - 1 %   Basophils Absolute 0.0  0.0 - 0.1 K/uL  COMPREHENSIVE METABOLIC PANEL      Result Value Ref Range   Sodium 138  137 - 147 mEq/L   Potassium 4.6  3.7 - 5.3 mEq/L   Chloride 103  96 - 112 mEq/L   CO2 22  19 - 32 mEq/L   Glucose, Bld 94  70 - 99 mg/dL   BUN 13  6 - 23 mg/dL   Creatinine, Ser 0.56  0.50 - 1.10 mg/dL   Calcium 9.1  8.4 - 10.5 mg/dL   Total Protein 7.7  6.0 - 8.3 g/dL   Albumin 4.5  3.5 - 5.2 g/dL   AST 27  0 - 37 U/L   ALT 26  0 - 35 U/L   Alkaline Phosphatase 66  39 - 117 U/L   Total Bilirubin 0.6  0.3 - 1.2 mg/dL   GFR calc non Af Amer >90  >90  mL/min   GFR calc Af Amer >90  >90 mL/min   Anion gap 13  5 - 15  LIPASE, BLOOD      Result Value Ref Range   Lipase 16  11 - 59 U/L  URINALYSIS, ROUTINE W REFLEX MICROSCOPIC      Result Value Ref Range   Color, Urine AMBER (*) YELLOW   APPearance CLOUDY (*) CLEAR   Specific Gravity, Urine 1.027  1.005 - 1.030   pH 5.0  5.0 - 8.0   Glucose, UA NEGATIVE  NEGATIVE mg/dL   Hgb urine dipstick NEGATIVE  NEGATIVE   Bilirubin Urine NEGATIVE  NEGATIVE   Ketones, ur NEGATIVE  NEGATIVE mg/dL   Protein, ur NEGATIVE  NEGATIVE mg/dL   Urobilinogen, UA 0.2  0.0 - 1.0 mg/dL   Nitrite NEGATIVE  NEGATIVE   Leukocytes, UA NEGATIVE  NEGATIVE  URINE RAPID DRUG SCREEN (HOSP PERFORMED)      Result Value Ref Range   Opiates NONE DETECTED  NONE DETECTED   Cocaine NONE DETECTED  NONE DETECTED   Benzodiazepines NONE DETECTED  NONE DETECTED   Amphetamines NONE DETECTED  NONE DETECTED   Tetrahydrocannabinol NONE DETECTED  NONE DETECTED   Barbiturates NONE DETECTED  NONE DETECTED  POC URINE PREG, ED      Result Value Ref Range   Preg Test, Ur NEGATIVE  NEGATIVE    Labs Review Labs Reviewed  WET PREP, GENITAL - Abnormal; Notable for the following:    Clue Cells Wet Prep HPF POC FEW (*)    WBC, Wet Prep HPF POC FEW (*)    All other components within normal limits  CBC WITH DIFFERENTIAL - Abnormal; Notable for the following:    WBC 15.3 (*)    Neutro Abs 11.8 (*)    Monocytes Absolute 1.2 (*)    All other components within normal limits  URINALYSIS, ROUTINE W REFLEX MICROSCOPIC - Abnormal; Notable for the following:    Color, Urine AMBER (*)    APPearance CLOUDY (*)    All other components within  normal limits  GC/CHLAMYDIA PROBE AMP  COMPREHENSIVE METABOLIC PANEL  LIPASE, BLOOD  URINE RAPID DRUG SCREEN (HOSP PERFORMED)  POC URINE PREG, ED    Imaging Review US Transvaginal Non-ob  08/06/2014   CLINICAL DATA:  Acute right-sided pelvic pain. Evaluate for ovarian torsion.  EXAM: TRANSABDOMINAL  AND TRANSVAGINAL ULTRASOUND OF PELVIS  DOPPLER ULTRASOUND OF OVARIES  TECHNIQUE: Both transabdominal and transvaginal ultrasound examinations of the pelvis were performed. Transabdominal technique was performed for global imaging of the pelvis including uterus, ovaries, adnexal regions, and pelvic cul-de-sac.  It was necessary to proceed with endovaginal exam following the transabdominal exam to visualize the ovaries. Color and duplex Doppler ultrasound was utilized to evaluate blood flow to the ovaries.  COMPARISON:  None.  FINDINGS: Uterus  Measurements: 7.6 x 3.5 x 5.0 cm. Parenchyma is somewhat heterogeneous, nonspecific. No mass or fibroid.  Endometrium  Thickness: 4 mm.  No focal abnormality visualized.  Right ovary  Measurements: 3.3 x 2.0 x 2.4 cm. Normal appearance/no adnexal mass.  Left ovary  Measurements: 3.7 x 2.0 x 2.2 cm. Normal appearance/no adnexal mass.  Pulsed Doppler evaluation of both ovaries demonstrates normal low-resistance arterial and venous waveforms.  Other findings  No free fluid.  IMPRESSION: No evidence of ovarian torsion. No acute findings to explain the patient's right-sided pelvic pain.   Electronically Signed   By: Lorin Picket M.D.   On: 08/06/2014 17:55   US Pelvis Complete  08/06/2014   CLINICAL DATA:  Acute right-sided pelvic pain. Evaluate for ovarian torsion.  EXAM: TRANSABDOMINAL AND TRANSVAGINAL ULTRASOUND OF PELVIS  DOPPLER ULTRASOUND OF OVARIES  TECHNIQUE: Both transabdominal and transvaginal ultrasound examinations of the pelvis were performed. Transabdominal technique was performed for global imaging of the pelvis including uterus, ovaries, adnexal regions, and pelvic cul-de-sac.  It was necessary to proceed with endovaginal exam following the transabdominal exam to visualize the ovaries. Color and duplex Doppler ultrasound was utilized to evaluate blood flow to the ovaries.  COMPARISON:  None.  FINDINGS: Uterus  Measurements: 7.6 x 3.5 x 5.0 cm. Parenchyma is  somewhat heterogeneous, nonspecific. No mass or fibroid.  Endometrium  Thickness: 4 mm.  No focal abnormality visualized.  Right ovary  Measurements: 3.3 x 2.0 x 2.4 cm. Normal appearance/no adnexal mass.  Left ovary  Measurements: 3.7 x 2.0 x 2.2 cm. Normal appearance/no adnexal mass.  Pulsed Doppler evaluation of both ovaries demonstrates normal low-resistance arterial and venous waveforms.  Other findings  No free fluid.  IMPRESSION: No evidence of ovarian torsion. No acute findings to explain the patient's right-sided pelvic pain.   Electronically Signed   By: Lorin Picket M.D.   On: 08/06/2014 17:55   Ct Abdomen Pelvis W Contrast  08/06/2014   CLINICAL DATA:  Diffuse abdominal pain since 11:30 a.m. Patient recently treated for urinary tract infection. Additionally the patient states they have been vomiting.  EXAM: CT ABDOMEN AND PELVIS WITH CONTRAST  TECHNIQUE: Multidetector CT imaging of the abdomen and pelvis was performed using the standard protocol following bolus administration of intravenous contrast.  CONTRAST:  32mL OMNIPAQUE IOHEXOL 300 MG/ML SOLN, 56mL OMNIPAQUE IOHEXOL 300 MG/ML SOLN  COMPARISON:  Pelvic ultrasound earlier same date.  FINDINGS: Visualization of the lower thorax demonstrates no consolidative or nodular pulmonary opacities. Normal heart size.  The liver is normal in size and contour without focal hepatic lesion identified. The gallbladder is unremarkable. The spleen, pancreas and bilateral adrenal glands are unremarkable. Kidneys enhance symmetrically with contrast.  Normal caliber abdominal aorta.  Urinary bladder is unremarkable. Uterus is unremarkable.  No abnormal bowel wall thickening or evidence for bowel obstruction. There is stool throughout the colon as can be seen with constipation. The appendix is not definitely identified however there is no significant inflammatory change within the right lower quadrant. There is no free fluid or free intraperitoneal air.  No  aggressive or acute appearing osseous lesions.  IMPRESSION: Stool throughout the colon as can be seen with constipation.  The appendix is not definitely identified however there is no significant right lower quadrant inflammatory stranding.  Otherwise no cause for acute abdominal pain identified.   Electronically Signed   By: Lovey Newcomer M.D.   On: 08/06/2014 18:42   [place] Art/ven Flow Abd Pelv Doppler  08/06/2014   CLINICAL DATA:  Acute right-sided pelvic pain. Evaluate for ovarian torsion.  EXAM: TRANSABDOMINAL AND TRANSVAGINAL ULTRASOUND OF PELVIS  DOPPLER ULTRASOUND OF OVARIES  TECHNIQUE: Both transabdominal and transvaginal ultrasound examinations of the pelvis were performed. Transabdominal technique was performed for global imaging of the pelvis including uterus, ovaries, adnexal regions, and pelvic cul-de-sac.  It was necessary to proceed with endovaginal exam following the transabdominal exam to visualize the ovaries. Color and duplex Doppler ultrasound was utilized to evaluate blood flow to the ovaries.  COMPARISON:  None.  FINDINGS: Uterus  Measurements: 7.6 x 3.5 x 5.0 cm. Parenchyma is somewhat heterogeneous, nonspecific. No mass or fibroid.  Endometrium  Thickness: 4 mm.  No focal abnormality visualized.  Right ovary  Measurements: 3.3 x 2.0 x 2.4 cm. Normal appearance/no adnexal mass.  Left ovary  Measurements: 3.7 x 2.0 x 2.2 cm. Normal appearance/no adnexal mass.  Pulsed Doppler evaluation of both ovaries demonstrates normal low-resistance arterial and venous waveforms.  Other findings  No free fluid.  IMPRESSION: No evidence of ovarian torsion. No acute findings to explain the patient's right-sided pelvic pain.   Electronically Signed   By: Lorin Picket M.D.   On: 08/06/2014 17:55     EKG Interpretation None      MDM   Final diagnoses:  Abdominal pain, unspecified abdominal location  Constipation, unspecified constipation type  BV (bacterial vaginosis)    Medications  sodium  chloride 0.9 % bolus 1,000 mL (0 mLs Intravenous Stopped 08/06/14 1850)  ondansetron (ZOFRAN-ODT) disintegrating tablet 4 mg (4 mg Oral Given 08/06/14 1641)  iohexol (OMNIPAQUE) 300 MG/ML solution 50 mL (50 mLs Oral Contrast Given 08/06/14 1658)  iohexol (OMNIPAQUE) 300 MG/ML solution 80 mL (80 mLs Intravenous Contrast Given 08/06/14 1754)   Filed Vitals:   08/06/14 1520 08/06/14 1949  BP: 132/47 91/45  Pulse: 78 69  Temp: 97.8 F (36.6 C) 98.3 F (36.8 C)  TempSrc: Oral Oral  Resp: 20 20  SpO2: 100% 96%   CBC noted elevated white blood cell count of 15.3. CMP unremarkable. Lipase negative elevation. Urine pregnancy negative. Urinalysis negative for nitrites or leukocytes-negative signs of infection. Wet prep noted few clue cells a few white blood cells. Urine drug screen negative. GC Chlamydia probe pending. Pelvic ultrasound negative for acute abnormalities. No evidence of torsion. CT abdomen and pelvis with contrast noted stool throughout the colon-suspicion to be constipation. Negative stranding in the right lower quadrant, no signs of inflammatory changes. Unremarkable CT of abdomen and pelvis, no acute abnormalities noted. Doubt appendicitis. Doubt pancreatitis. Doubt cholecystitis/cholangitis. Doubt SBO. Doubt tubo-ovarian abscess. Doubt ovarian torsion. Doubt pyelonephritis. Doubt trichomonal infection. Doubt UTI - patient responded well to antibiotics by mouth. Labs noted leukocytosis with unknown source. Definitive  etiology of abdominal pain unknown. Patient stable, afebrile. Patient not septic appearing. Patient tolerated fluids by mouth-negative episodes of emesis while in ED setting. Discharged patient. Discharge patient with referral to [facility]. Discharge patient with Flagyl and Zofran. Discussed with patient to increase hydration and fiber. Discussed with patient to closely monitor symptoms and if symptoms are to worsen or change to report back to the ED -  strict return instructions given.  Patient agreed to plan of care, understood, all questions answered.   Jamse Mead, PA-C 08/06/14 2018

## 2014-08-06 NOTE — ED Notes (Signed)
Per pt, abdominal pain since 11:30 am. Vomiting.  No fever.  Pt recently treated for UTI and still taking antibiotics.

## 2014-08-06 NOTE — ED Notes (Signed)
Patient returned from [place] and then was taken to [place] Patient in NAD upon going for further testing

## 2014-08-06 NOTE — ED Notes (Signed)
EDP at bedside  

## 2014-08-07 LAB — GC/CHLAMYDIA PROBE AMP
CT PROBE, AMP APTIMA: NEGATIVE
GC PROBE AMP APTIMA: NEGATIVE

## 2014-08-07 NOTE — ED Provider Notes (Signed)
Medical screening examination/treatment/procedure(s) were performed by non-physician practitioner and as supervising physician I was immediately available for consultation/collaboration.    Dorie Rank, MD 08/07/14 [phone]

## 2015-03-10 ENCOUNTER — Encounter (HOSPITAL_COMMUNITY): Payer: Self-pay | Admitting: *Deleted

## 2015-03-10 ENCOUNTER — Inpatient Hospital Stay (HOSPITAL_COMMUNITY): Payer: BLUE CROSS/BLUE SHIELD

## 2015-03-10 ENCOUNTER — Inpatient Hospital Stay (HOSPITAL_COMMUNITY)
Admission: AD | Admit: 2015-03-10 | Discharge: 2015-03-11 | Disposition: A | Discharge status: Home / Self Care | Payer: BLUE CROSS/BLUE SHIELD | Source: Ambulatory Visit | Attending: Obstetrics and Gynecology | Admitting: Obstetrics and Gynecology

## 2015-03-10 DIAGNOSIS — Z3A08 8 weeks gestation of pregnancy: Secondary | ICD-10-CM | POA: Insufficient documentation

## 2015-03-10 DIAGNOSIS — F32A Depression, unspecified: Secondary | ICD-10-CM | POA: Diagnosis not present

## 2015-03-10 DIAGNOSIS — O99341 Other mental disorders complicating pregnancy, first trimester: Secondary | ICD-10-CM | POA: Insufficient documentation

## 2015-03-10 DIAGNOSIS — O9989 Other specified diseases and conditions complicating pregnancy, childbirth and the puerperium: Secondary | ICD-10-CM | POA: Diagnosis not present

## 2015-03-10 DIAGNOSIS — Z889 Allergy status to unspecified drugs, medicaments and biological substances status: Secondary | ICD-10-CM

## 2015-03-10 DIAGNOSIS — F1911 Other psychoactive substance abuse, in remission: Secondary | ICD-10-CM

## 2015-03-10 DIAGNOSIS — F418 Other specified anxiety disorders: Secondary | ICD-10-CM | POA: Diagnosis not present

## 2015-03-10 DIAGNOSIS — Z87440 Personal history of urinary (tract) infections: Secondary | ICD-10-CM | POA: Insufficient documentation

## 2015-03-10 DIAGNOSIS — F419 Anxiety disorder, unspecified: Secondary | ICD-10-CM | POA: Diagnosis not present

## 2015-03-10 DIAGNOSIS — F17210 Nicotine dependence, cigarettes, uncomplicated: Secondary | ICD-10-CM | POA: Insufficient documentation

## 2015-03-10 DIAGNOSIS — N390 Urinary tract infection, site not specified: Secondary | ICD-10-CM | POA: Diagnosis present

## 2015-03-10 DIAGNOSIS — O99331 Smoking (tobacco) complicating pregnancy, first trimester: Secondary | ICD-10-CM | POA: Insufficient documentation

## 2015-03-10 DIAGNOSIS — F329 Major depressive disorder, single episode, unspecified: Secondary | ICD-10-CM | POA: Diagnosis not present

## 2015-03-10 DIAGNOSIS — M549 Dorsalgia, unspecified: Secondary | ICD-10-CM | POA: Diagnosis present

## 2015-03-10 DIAGNOSIS — F319 Bipolar disorder, unspecified: Secondary | ICD-10-CM | POA: Diagnosis not present

## 2015-03-10 LAB — COMPREHENSIVE METABOLIC PANEL
ALT: 41 U/L (ref 14–54)
AST: 28 U/L (ref 15–41)
Albumin: 4.2 g/dL (ref 3.5–5.0)
Alkaline Phosphatase: 52 U/L (ref 38–126)
Anion gap: 8 (ref 5–15)
BUN: 7 mg/dL (ref 6–20)
CO2: 24 mmol/L (ref 22–32)
Calcium: 9 mg/dL (ref 8.9–10.3)
Chloride: 102 mmol/L (ref 101–111)
Creatinine, Ser: 0.43 mg/dL — ABNORMAL LOW (ref 0.44–1.00)
GFR calc Af Amer: >60 mL/min (ref >60)
Glucose, Bld: 84 mg/dL (ref 70–99)
POTASSIUM: 3.5 mmol/L (ref 3.5–5.1)
SODIUM: 134 mmol/L — AB (ref 135–145)
Total Bilirubin: 0.4 mg/dL (ref 0.3–1.2)
Total Protein: 6.7 g/dL (ref 6.5–8.1)

## 2015-03-10 LAB — CBC WITH DIFFERENTIAL/PLATELET
BASOS ABS: 0 10*3/uL (ref 0.0–0.1)
Basophils Relative: 0 % (ref 0–1)
EOS PCT: 1 % (ref 0–5)
Eosinophils Absolute: 0.1 10*3/uL (ref 0.0–0.7)
HCT: 36.5 % (ref 36.0–46.0)
Hemoglobin: 13.2 g/dL (ref 12.0–15.0)
LYMPHS PCT: 20 % (ref 12–46)
Lymphs Abs: 1.1 10*3/uL (ref 0.7–4.0)
MCH: 30.2 pg (ref 26.0–34.0)
MCHC: 36.2 g/dL — ABNORMAL HIGH (ref 30.0–36.0)
MCV: 83.5 fL (ref 78.0–100.0)
Monocytes Absolute: 0.7 10*3/uL (ref 0.1–1.0)
Monocytes Relative: 12 % (ref 3–12)
NEUTROS PCT: 67 % (ref 43–77)
Neutro Abs: 3.9 10*3/uL (ref 1.7–7.7)
PLATELETS: 220 10*3/uL (ref 150–400)
RBC: 4.37 MIL/uL (ref 3.87–5.11)
RDW: 11.8 % (ref 11.5–15.5)
WBC: 5.7 10*3/uL (ref 4.0–10.5)

## 2015-03-10 LAB — URINALYSIS, ROUTINE W REFLEX MICROSCOPIC
Bilirubin Urine: NEGATIVE
Glucose, UA: NEGATIVE mg/dL
HGB URINE DIPSTICK: NEGATIVE
Ketones, ur: 40 mg/dL — AB
LEUKOCYTES UA: NEGATIVE
Nitrite: NEGATIVE
PROTEIN: NEGATIVE mg/dL
SPECIFIC GRAVITY, URINE: 1.025 (ref 1.005–1.030)
UROBILINOGEN UA: 0.2 mg/dL (ref 0.0–1.0)
pH: 5.5 (ref 5.0–8.0)

## 2015-03-10 MED ORDER — LACTATED RINGERS IV SOLN: INTRAVENOUS | Status: DC

## 2015-03-10 MED ORDER — HYDROMORPHONE HCL 1 MG/ML IJ SOLN: 1.0000 mg | INTRAMUSCULAR | Status: DC | PRN

## 2015-03-10 MED ORDER — LACTATED RINGERS IV BOLUS (SEPSIS)
500.0000 mL | Freq: Once | INTRAVENOUS | Status: AC
  Administered 2015-03-10: 1000 mL via INTRAVENOUS

## 2015-03-10 MED ORDER — CEFTRIAXONE SODIUM IN DEXTROSE 20 MG/ML IV SOLN
1.0000 g | Freq: Once | INTRAVENOUS | Status: AC
  Administered 2015-03-11: 1 g via INTRAVENOUS
  Filled 2015-03-10: qty 50

## 2015-03-10 NOTE — MAU Provider Note (Signed)
History   24 yo G1P0 at 8 6/7 weeks presented after calling to report severe back pain and fever--has been treated x 2 for UTI, with current Macrobid since earlier today.  Had >100K Ecoli on NOB urine on 4/25--Rx'd Macrobid, but did not start until today due to pharmacy having to order the medication.  Reports onset of N/V, fever, chills, and low back pain since this am.  Hx chronic UTIs, endometriosis, chronic pelvic pain.  Had [place] at [place] at 5 6/7 weeks, with + FHR seen.  Patient in substance abuse recovery for several years, currently living in supportive group home.  Declines any use of narcotics.  Patient Active Problem List   Diagnosis Date Noted  . Bipolar 1 disorder 03/10/2015  . Chronic depression 03/10/2015  . Chronic anxiety 03/10/2015  . Recurrent UTI 03/10/2015  . Allergy history, drug--Cipro 03/10/2015  . Hx of substance abuse--staying at [facility] 03/10/2015    Chief Complaint  Patient presents with  . Back Pain  . Leg Pain   HPI:  As above  OB History    Gravida Para Term Preterm AB TAB SAB Ectopic Multiple Living   1               Past Medical History  Diagnosis Date  . Chronic UTI   . Endometriosis     No past surgical history on file.  No family history on file.  History  Substance Use Topics  . Smoking status: Current Every Day Smoker -- 1.00 packs/day    Types: Cigarettes  . Smokeless tobacco: Never Used  . Alcohol Use: No    Allergies:  Allergies  Allergen Reactions  . Ciprofloxacin Hives    Prescriptions prior to admission  Medication Sig Dispense Refill Last Dose  . acetaminophen (TYLENOL) 500 MG tablet Take 1,000 mg by mouth every 6 (six) hours as needed for moderate pain.   03/10/2015 at Unknown time  . nitrofurantoin (MACRODANTIN) 100 MG capsule Take 100 mg by mouth 2 (two) times daily.   03/10/2015 at Unknown time  . Prenatal Vit-Fe Fumarate-FA (PRENATAL MULTIVITAMIN) TABS tablet Take 1 tablet by mouth daily at 12 noon.    03/10/2015 at Unknown time  . cephALEXin (KEFLEX) 500 MG capsule Take 1 capsule (500 mg total) by mouth 4 (four) times daily. Take all of medicine and drink lots of fluids (Patient not taking: Reported on 03/10/2015) 20 capsule 0 08/06/2014 at Unknown time  . metroNIDAZOLE (FLAGYL) 500 MG tablet Take 1 tablet (500 mg total) by mouth 2 (two) times daily. (Patient not taking: Reported on 03/10/2015) 14 tablet 0   . ondansetron (ZOFRAN) 4 MG tablet Take 1 tablet (4 mg total) by mouth every 8 (eight) hours as needed for nausea or vomiting. (Patient not taking: Reported on 03/10/2015) 5 tablet 0 more than one month    ROS:  Back pain, chills Physical Exam   Blood pressure 106/43, pulse 86, temperature 98.4 F (36.9 C), temperature source Oral, resp. rate 16, height 5\' 1"  (1.549 m), weight 50.349 kg (111 lb), SpO2 100 %.   Physical Exam  Deferred at present--will be performed by Farrel Gordon, CNM  ED Course  Assessment: Early pregnancy Back pain Hx fever Hx recurrent UTI Bipolar/anxiety/depression In recovery from substance abuse  Plan: IV hydration CBC, diff CMP [place] for viability Patient declines IV med for pain--will give Tylenol 650 mg po now. Farrel Gordon, CNM, will assume care of patient.   Donnel Saxon CNM, MSN 03/10/2015  10:54 PM

## 2015-03-10 NOTE — MAU Note (Signed)
Pt statestes she woke this morning to pain about 0945 and pain has been non stop

## 2015-03-10 NOTE — MAU Note (Signed)
Pt reports lower back pain and nausea/vomiting. States temp was 100.0

## 2015-03-10 NOTE — Progress Notes (Signed)
Pt states she gets headaches from throwing up

## 2015-03-10 NOTE — MAU Note (Signed)
Pt states she has been "clean since August 2,2015" and does not use" narcotics" for pain management. Pt states she can not use anything that may be flagged on a "Drug Test"

## 2015-03-10 NOTE — MAU Note (Signed)
Pt states she is having pain that she rates a 8. Pt states she had Tylenol @ 1945

## 2015-03-11 ENCOUNTER — Ambulatory Visit (HOSPITAL_COMMUNITY): Payer: BLUE CROSS/BLUE SHIELD | Admitting: Anesthesiology

## 2015-03-11 ENCOUNTER — Encounter (HOSPITAL_COMMUNITY)
Admission: AD | Disposition: A | Discharge status: Home / Self Care | Payer: Self-pay | Source: Ambulatory Visit | Attending: Obstetrics and Gynecology

## 2015-03-11 ENCOUNTER — Encounter (HOSPITAL_COMMUNITY): Payer: Self-pay

## 2015-03-11 ENCOUNTER — Ambulatory Visit (HOSPITAL_COMMUNITY)
Admission: AD | Admit: 2015-03-11 | Discharge: 2015-03-11 | Disposition: A | Discharge status: Home / Self Care | Payer: BLUE CROSS/BLUE SHIELD | Source: Ambulatory Visit | Attending: Obstetrics & Gynecology | Admitting: Obstetrics & Gynecology

## 2015-03-11 ENCOUNTER — Encounter (HOSPITAL_COMMUNITY)
Admission: AD | Disposition: A | Discharge status: Home / Self Care | Payer: Self-pay | Source: Ambulatory Visit | Attending: Obstetrics & Gynecology

## 2015-03-11 DIAGNOSIS — Z87440 Personal history of urinary (tract) infections: Secondary | ICD-10-CM | POA: Diagnosis not present

## 2015-03-11 DIAGNOSIS — Z881 Allergy status to other antibiotic agents status: Secondary | ICD-10-CM | POA: Insufficient documentation

## 2015-03-11 DIAGNOSIS — F17210 Nicotine dependence, cigarettes, uncomplicated: Secondary | ICD-10-CM | POA: Insufficient documentation

## 2015-03-11 DIAGNOSIS — F329 Major depressive disorder, single episode, unspecified: Secondary | ICD-10-CM | POA: Insufficient documentation

## 2015-03-11 DIAGNOSIS — F419 Anxiety disorder, unspecified: Secondary | ICD-10-CM | POA: Insufficient documentation

## 2015-03-11 DIAGNOSIS — F319 Bipolar disorder, unspecified: Secondary | ICD-10-CM | POA: Diagnosis not present

## 2015-03-11 DIAGNOSIS — F1910 Other psychoactive substance abuse, uncomplicated: Secondary | ICD-10-CM | POA: Insufficient documentation

## 2015-03-11 DIAGNOSIS — O021 Missed abortion: Secondary | ICD-10-CM | POA: Insufficient documentation

## 2015-03-11 HISTORY — PX: DILATION AND EVACUATION: SHX1459

## 2015-03-11 LAB — ABO/RH: ABO/RH(D): A POS

## 2015-03-11 LAB — TYPE AND SCREEN
ABO/RH(D): A POS
ANTIBODY SCREEN: NEGATIVE

## 2015-03-11 SURGERY — DILATION AND EVACUATION, UTERUS
Anesthesia: Monitor Anesthesia Care | Site: Vagina

## 2015-03-11 SURGERY — DILATION AND EVACUATION, UTERUS
Anesthesia: Choice

## 2015-03-11 MED ORDER — MEPERIDINE HCL 25 MG/ML IJ SOLN: 6.2500 mg | INTRAMUSCULAR | Status: DC | PRN

## 2015-03-11 MED ORDER — KETOROLAC TROMETHAMINE 30 MG/ML IJ SOLN
INTRAMUSCULAR | Status: DC | PRN
  Administered 2015-03-11: 30 mg via INTRAVENOUS

## 2015-03-11 MED ORDER — LIDOCAINE HCL 2 % IJ SOLN
INTRAMUSCULAR | Status: DC | PRN
  Administered 2015-03-11: 10 mL

## 2015-03-11 MED ORDER — PROPOFOL 10 MG/ML IV BOLUS
INTRAVENOUS | Status: DC | PRN
  Administered 2015-03-11: 20 mg via INTRAVENOUS
  Administered 2015-03-11: 30 mg via INTRAVENOUS
  Administered 2015-03-11: 20 mg via INTRAVENOUS
  Administered 2015-03-11: 10 mg via INTRAVENOUS
  Administered 2015-03-11 (×3): 20 mg via INTRAVENOUS

## 2015-03-11 MED ORDER — DEXAMETHASONE SODIUM PHOSPHATE 4 MG/ML IJ SOLN
INTRAMUSCULAR | Status: AC
Start: 2015-03-11 — End: 2015-03-11
  Filled 2015-03-11: qty 1

## 2015-03-11 MED ORDER — FENTANYL CITRATE (PF) 250 MCG/5ML IJ SOLN
INTRAMUSCULAR | Status: AC
Start: 2015-03-11 — End: 2015-03-11
  Filled 2015-03-11: qty 5

## 2015-03-11 MED ORDER — DEXTROSE 5 % IV SOLN
100.0000 mg | Freq: Once | INTRAVENOUS | Status: AC
  Administered 2015-03-11: 100 mg via INTRAVENOUS
  Filled 2015-03-11: qty 100

## 2015-03-11 MED ORDER — SCOPOLAMINE 1 MG/3DAYS TD PT72
MEDICATED_PATCH | TRANSDERMAL | Status: AC
  Filled 2015-03-11: qty 1

## 2015-03-11 MED ORDER — ACETAMINOPHEN 10 MG/ML IV SOLN
1000.0000 mg | Freq: Once | INTRAVENOUS | Status: AC
  Administered 2015-03-11: 1000 mg via INTRAVENOUS
  Filled 2015-03-11: qty 100

## 2015-03-11 MED ORDER — DEXAMETHASONE SODIUM PHOSPHATE 10 MG/ML IJ SOLN
INTRAMUSCULAR | Status: DC | PRN
  Administered 2015-03-11: 4 mg via INTRAVENOUS

## 2015-03-11 MED ORDER — FENTANYL CITRATE (PF) 100 MCG/2ML IJ SOLN
INTRAMUSCULAR | Status: DC | PRN
  Administered 2015-03-11 (×2): 50 ug via INTRAVENOUS

## 2015-03-11 MED ORDER — PROMETHAZINE HCL 25 MG/ML IJ SOLN: 6.2500 mg | INTRAMUSCULAR | Status: DC | PRN

## 2015-03-11 MED ORDER — MIDAZOLAM HCL 2 MG/2ML IJ SOLN
INTRAMUSCULAR | Status: AC
  Filled 2015-03-11: qty 2

## 2015-03-11 MED ORDER — DOXYCYCLINE HYCLATE 100 MG PO TABS
200.0000 mg | ORAL_TABLET | Freq: Once | ORAL | Status: AC
  Administered 2015-03-11: 200 mg via ORAL

## 2015-03-11 MED ORDER — LIDOCAINE HCL (CARDIAC) 20 MG/ML IV SOLN
INTRAVENOUS | Status: AC
  Filled 2015-03-11: qty 5

## 2015-03-11 MED ORDER — LIDOCAINE HCL 2 % IJ SOLN
INTRAMUSCULAR | Status: AC
  Filled 2015-03-11: qty 20

## 2015-03-11 MED ORDER — METHYLERGONOVINE MALEATE 0.2 MG PO TABS: 0.2000 mg | ORAL_TABLET | Freq: Once | ORAL | Status: DC

## 2015-03-11 MED ORDER — FENTANYL CITRATE (PF) 100 MCG/2ML IJ SOLN
INTRAMUSCULAR | Status: AC
  Filled 2015-03-11: qty 2

## 2015-03-11 MED ORDER — ONDANSETRON HCL 4 MG/2ML IJ SOLN
INTRAMUSCULAR | Status: AC
  Filled 2015-03-11: qty 2

## 2015-03-11 MED ORDER — PROPOFOL 10 MG/ML IV BOLUS
INTRAVENOUS | Status: AC
  Filled 2015-03-11: qty 20

## 2015-03-11 MED ORDER — ZOLPIDEM TARTRATE 10 MG PO TABS: 10.0000 mg | ORAL_TABLET | Freq: Every evening | ORAL | Status: DC | PRN

## 2015-03-11 MED ORDER — LACTATED RINGERS IV SOLN
INTRAVENOUS | Status: DC
  Administered 2015-03-11 (×3): via INTRAVENOUS

## 2015-03-11 MED ORDER — LACTATED RINGERS IV SOLN: INTRAVENOUS | Status: DC

## 2015-03-11 MED ORDER — IBUPROFEN 800 MG PO TABS: 800.0000 mg | ORAL_TABLET | Freq: Three times a day (TID) | ORAL | Status: DC | PRN

## 2015-03-11 MED ORDER — FENTANYL CITRATE (PF) 100 MCG/2ML IJ SOLN
25.0000 ug | INTRAMUSCULAR | Status: DC | PRN
  Administered 2015-03-11: 50 ug via INTRAVENOUS

## 2015-03-11 MED ORDER — DOXYCYCLINE HYCLATE 100 MG PO TABS
ORAL_TABLET | ORAL | Status: AC
  Filled 2015-03-11: qty 2

## 2015-03-11 MED ORDER — KETOROLAC TROMETHAMINE 30 MG/ML IJ SOLN
INTRAMUSCULAR | Status: AC
  Filled 2015-03-11: qty 1

## 2015-03-11 MED ORDER — ONDANSETRON HCL 4 MG/2ML IJ SOLN
INTRAMUSCULAR | Status: DC | PRN
  Administered 2015-03-11: 4 mg via INTRAVENOUS

## 2015-03-11 MED ORDER — SCOPOLAMINE 1 MG/3DAYS TD PT72
1.0000 | MEDICATED_PATCH | Freq: Once | TRANSDERMAL | Status: DC
  Administered 2015-03-11: 1.5 mg via TRANSDERMAL

## 2015-03-11 MED ORDER — MIDAZOLAM HCL 2 MG/2ML IJ SOLN
1.0000 mg | INTRAMUSCULAR | Status: AC | PRN
  Administered 2015-03-11 (×2): 1 mg via INTRAVENOUS

## 2015-03-11 SURGICAL SUPPLY — 22 items
CATH ROBINSON RED A/P 16FR (CATHETERS) ×3 IMPLANT
CLOTH BEACON ORANGE TIMEOUT ST (SAFETY) ×3 IMPLANT
DECANTER SPIKE VIAL GLASS SM (MISCELLANEOUS) ×3 IMPLANT
GLOVE BIO SURGEON STRL SZ 6.5 (GLOVE) ×2 IMPLANT
GLOVE BIO SURGEONS STRL SZ 6.5 (GLOVE) ×1
GLOVE BIOGEL PI IND STRL 7.0 (GLOVE) ×1 IMPLANT
GLOVE BIOGEL PI INDICATOR 7.0 (GLOVE) ×2
GOWN STRL REUS W/TWL LRG LVL3 (GOWN DISPOSABLE) ×6 IMPLANT
KIT BERKELEY 1ST TRIMESTER 3/8 (MISCELLANEOUS) ×3 IMPLANT
NEEDLE SPNL 22GX3.5 QUINCKE BK (NEEDLE) ×3 IMPLANT
NS IRRIG 1000ML POUR BTL (IV SOLUTION) ×3 IMPLANT
PACK VAGINAL MINOR WOMEN LF (CUSTOM PROCEDURE TRAY) ×3 IMPLANT
PAD OB MATERNITY 4.3X12.25 (PERSONAL CARE ITEMS) ×3 IMPLANT
PAD PREP 24X48 CUFFED NSTRL (MISCELLANEOUS) ×3 IMPLANT
SET BERKELEY SUCTION TUBING (SUCTIONS) ×3 IMPLANT
SYR 20CC LL (SYRINGE) ×3 IMPLANT
SYR CONTROL 10ML LL (SYRINGE) ×3 IMPLANT
TOWEL OR 17X24 6PK STRL BLUE (TOWEL DISPOSABLE) ×6 IMPLANT
VACURETTE 10 RIGID CVD (CANNULA) IMPLANT
VACURETTE 7MM CVD STRL WRAP (CANNULA) IMPLANT
VACURETTE 8 RIGID CVD (CANNULA) IMPLANT
VACURETTE 9 RIGID CVD (CANNULA) ×3 IMPLANT

## 2015-03-11 NOTE — Anesthesia Postprocedure Evaluation (Signed)
  Anesthesia Post-op Note  Patient: Karen [name]  Procedure(s) Performed: Procedure(s): DILATATION AND EVACUATION (N/A)  Patient Location: PACU  Anesthesia Type:General  Level of Consciousness: awake, alert  and oriented  Airway and Oxygen Therapy: Patient Spontanous Breathing  Post-op Pain: mild  Post-op Assessment: Post-op Vital signs reviewed, Patient's Cardiovascular Status Stable, Respiratory Function Stable, Patent Airway and No signs of Nausea or vomiting  Post-op Vital Signs: Reviewed and stable  Last Vitals:  Filed Vitals:   03/11/15 0728  BP: 95/58  Pulse: 93  Temp: 36.7 C  Resp: 18    Complications: No apparent anesthesia complications

## 2015-03-11 NOTE — Op Note (Addendum)
Patient's name: Karen [name], Karen [name]  Date of birth: 1991/06/19  Date of procedure: 03/11/2015  Preop diagnosis: Missed abortion at [redacted] weeks EGA.  Postop diagnosis: Same as above.  Surgeon: Dr. Waymon Amato.  Assistants: Merchandiser, retail.   Anesthesia: MAC  Complications: None  Input: 1000CC LR  Output: EBL: 200cc (including products of conception)  Urine: 250cc.   Findings: 9 week size anteverted uterus. No palpable adnexal masses bilaterally. Cervix closed.   Indications: 24 year-old G1 para 0 who was found to have a missed abortion by an ultrasound that showed absent fetal heart beat in fetus that measured [redacted] weeks EGA.  The patient's desired surgical management of the missed abortion. We discussed risks benefits and alternatives of the procedure. She is known blood group A positive  Procedure:  Informed consent was obtained from the patient to undergo the procedure. She was taken to the operating room anesthesia was administered without difficulty. Doxycycline 100 mg IV was given preoperatively.  She was placed in the dorsal lithotomy position and prepped and draped in the usual sterile fashion. Straight catheterization was performed.  A speculum wAS used to view the cervix. The Allis clamp was used to hold the cervix in place.  The cervix was dilated serially with the [facility] dilators up to #27 dilator. The suction curette #9 curved was then advanced and the uterus cleared of the products of conception with 3 passes of the suction curette.  Gentle sharp curettage was then performed and the 4 quadrants were noted to be gritty.  The suction curette was once again passed into the uterus to clear it of the remaining products of conception. There was minimal bleeding at the end of this procedure. The instruments were then removed and the patient was then awoken from anesthesia she was cleaned and taken to recovery room in stable condition  Specimen: Products of conception.

## 2015-03-11 NOTE — OR Nursing (Signed)
fentnyl 50 mcg wasted in sink witness tamika hester rn

## 2015-03-11 NOTE — Brief Op Note (Signed)
03/11/2015  10:11 AM  PATIENT:  Karen [name]  24 y.o. female  PRE-OPERATIVE DIAGNOSIS:  missed abortion at [redacted] weeks EGA  POST-OPERATIVE DIAGNOSIS:  missed abortion at [redacted] weeks EGA  PROCEDURE:  Procedure(s): DILATATION AND EVACUATION (N/A)  SURGEON:  Surgeon(s) and Role:    * Waymon Amato, MD - Primary  PHYSICIAN ASSISTANT:   ASSISTANTS: Scrub Technician   ANESTHESIA:   general  EBL:  Total I/O In: 1200 [I.V.:1200] Out: 320 [Urine:300; Blood:20] EBL 250 CC (INCLUDING PRODUCTS OF CONCEPTION)  BLOOD ADMINISTERED:none  DRAINS: none   LOCAL MEDICATIONS USED:  LIDOCAINE  and NONE  SPECIMEN:  Source of Specimen:  products of conception  DISPOSITION OF SPECIMEN:  PATHOLOGY  COUNTS:  YES  TOURNIQUET:  * No tourniquets in log *  DICTATION: .Dragon Dictation  PLAN OF CARE: Discharge to home after PACU  PATIENT DISPOSITION:  PACU - hemodynamically stable.   Delay start of Pharmacological VTE agent (>24hrs) due to surgical blood loss or risk of bleeding: not applicable

## 2015-03-11 NOTE — Discharge Instructions (Signed)
DISCHARGE INSTRUCTIONS: D&C / D&E The following instructions have been prepared to help you care for yourself upon your return home.   Personal hygiene:  Use sanitary pads for vaginal drainage, not tampons.  Shower the day after your procedure.  NO tub baths, pools or Jacuzzis for 2-3 weeks.  Wipe front to back after using the bathroom.  Activity and limitations:  Do NOT drive or operate any equipment for 24 hours. The effects of anesthesia are still present and drowsiness may result.  Do NOT rest in bed all day.  Walking is encouraged.  Walk up and down stairs slowly.  You may resume your normal activity in one to two days or as indicated by your physician.  Sexual activity: NO intercourse for at least 2 weeks after the procedure, or as indicated by your physician.  Diet: Eat a light meal as desired this evening. You may resume your usual diet tomorrow.  Return to work: You may resume your work activities in one to two days or as indicated by your doctor.  What to expect after your surgery: Expect to have vaginal bleeding/discharge for 2-3 days and spotting for up to 10 days. It is not unusual to have soreness for up to 1-2 weeks. You may have a slight burning sensation when you urinate for the first day. Mild cramps may continue for a couple of days. You may have a regular period in 2-6 weeks.  NO IBUPROFEN PRODUCTS (MOTRIN, ADVIL) OR ALEVE UNTIL 3:50PM TODAY.   Call your doctor for any of the following:  Excessive vaginal bleeding, saturating and changing one pad every hour.  Inability to urinate 6 hours after discharge from hospital.  Pain not relieved by pain medication.  Fever of 100.4 F or greater.  Unusual vaginal discharge or odor.   Call for an appointment:    Patients signature: ______________________  Nurses signature ________________________  Support person's signature_______________________

## 2015-03-11 NOTE — Transfer of Care (Signed)
Immediate Anesthesia Transfer of Care Note  Patient: Karen [name]  Procedure(s) Performed: Procedure(s): DILATATION AND EVACUATION (N/A)  Patient Location: PACU  Anesthesia Type:MAC  Level of Consciousness: sedated  Airway & Oxygen Therapy: Patient Spontanous Breathing  Post-op Assessment: Report given to RN  Post vital signs: Reviewed and stable  Last Vitals:  Filed Vitals:   03/11/15 0728  BP: 95/58  Pulse: 93  Temp: 36.7 C  Resp: 18    Complications: No apparent anesthesia complications

## 2015-03-11 NOTE — Progress Notes (Signed)
Karen [name] and her boyfriend, Karen [name], were very much looking forward to having a baby and they are grieving this loss.  In addition to feelings of grief, Karen [name] is experiencing a lot of anxiety.  She reported that she has been off of her medication for bipolar and anxiety for several months.  She had been attempting to work with her doctor to change medications, but then went through a move and just as she was starting to look for a doctor to go back on her medication, she found out she was pregnant.  She asked for grief counseling referrals.  I am providing her with information on therapists that can provide her with grief counseling and support for bipolar.  I am also providing her with information for crisis mental health support.  Additionally, I am giving her information for [employer] support for her and her boyfriend together.  I consulted with Lucita Ferrara, [name] as well.  [place], Bcc Pager, [phone] 11:17 AM

## 2015-03-11 NOTE — MAU Provider Note (Signed)
Continuation of care.  Call from patient's primary care nurse advising that pt is still in pain despite Tylenol. In to examine pt when u/s called for her. In light of persistent pain, discussed briefly w/ pt that we could give her a single dose of IV antibiotics to see if that might alleviate her pain. She agreed. Informed couple that I would be back to examine her after she returned from u/s.  Farrel Gordon, CNM 03/11/15, 01:28 AM  ADDENDUM: Returned from u/s. Reports ongoing pain despite Rocephin dose - declined additional medication.  Physical exam benign. No CVAT elicited. Pelvic exam declined by pt. She continued to deny bleeding or abdominal pain.  Results of Missed AB reviewed w/ pt and boyfriend. Pt screaming hysterically (appropriate stage of grief) w/ boyfriend by side and very supportive. Allowed couple time to process before reviewing options.  ADDENDUM: After ~ 10 minutes of alone time, was called back to room by couple to discuss next step. Reviewed management options. D&C elected by pt with request that it be done as soon as possible.  Dr. Nelda Marseille notified. Pt scheduled for D&E w/ Dr. Alesia Richards at 9:15 AM today. Pt. Instructed to remain NPO and to return to the hospital at 07:30 AM. All questions answered to couple's satisfaction - both verbalized understanding of pre procedure instructions. Support to couple for loss - informed that various resources for support would be discussed post procedure.   Farrel Gordon, CNM 03/11/15, 02:30 AM

## 2015-03-11 NOTE — Anesthesia Preprocedure Evaluation (Addendum)
Anesthesia Evaluation  Patient identified by MRN, date of birth, ID band Patient awake    Reviewed: Allergy & Precautions, NPO status , Patient's Chart, lab work & pertinent test results, reviewed documented beta blocker date and time   Airway Mallampati: II   Neck ROM: Full    Dental  (+) Dental Advisory Given, Teeth Intact   Pulmonary Current Smoker,  breath sounds clear to auscultation        Cardiovascular negative cardio ROS  Rhythm:Regular     Neuro/Psych Anxiety Depression Bipolar Disorder    GI/Hepatic negative GI ROS, Neg liver ROS,   Endo/Other  negative endocrine ROS  Renal/GU negative Renal ROS     Musculoskeletal   Abdominal (+)  Abdomen: soft.    Peds  Hematology   Anesthesia Other Findings   Reproductive/Obstetrics                            Anesthesia Physical Anesthesia Plan  ASA: II  Anesthesia Plan: MAC   Post-op Pain Management:    Induction: Intravenous  Airway Management Planned: Natural Airway  Additional Equipment:   Intra-op Plan:   Post-operative Plan:   Informed Consent: I have reviewed the patients History and Physical, chart, labs and discussed the procedure including the risks, benefits and alternatives for the proposed anesthesia with the patient or authorized representative who has indicated his/her understanding and acceptance.     Plan Discussed with:   Anesthesia Plan Comments:         Anesthesia Quick Evaluation

## 2015-03-11 NOTE — H&P (Addendum)
History   24 yo G1P0 at 8 6/7 weeks presented after calling to report severe back pain and fever--has been treated x 2 for UTI, with current Macrobid since earlier today.  Had >100K Ecoli on NOB urine on 4/25--Rx'd Macrobid, but did not start until today due to pharmacy having to order the medication.  Reports onset of N/V, fever, chills, and low back pain since this am.  Hx chronic UTIs, endometriosis, chronic pelvic pain.  Had [place] at [facility] at 5 6/7 weeks, with + FHR seen.  Patient in substance abuse recovery for several years, currently living in supportive group home.  Declines any use of narcotics.  Patient Active Problem List   Diagnosis Date Noted  . Bipolar 1 disorder 03/10/2015  . Chronic depression 03/10/2015  . Chronic anxiety 03/10/2015  . Recurrent UTI 03/10/2015  . Allergy history, drug--Cipro 03/10/2015  . Hx of substance abuse--staying at [facility] 03/10/2015  . Back pain 03/10/2015    No chief complaint on file.  HPI:  As above  OB History    Gravida Para Term Preterm AB TAB SAB Ectopic Multiple Living   1               Past Medical History  Diagnosis Date  . Chronic UTI   . Endometriosis     No past surgical history on file.  No family history on file.  History  Substance Use Topics  . Smoking status: Current Every Day Smoker -- 1.00 packs/day    Types: Cigarettes  . Smokeless tobacco: Never Used  . Alcohol Use: No    Allergies:  Allergies  Allergen Reactions  . Ciprofloxacin Hives    Prescriptions prior to admission  Medication Sig Dispense Refill Last Dose  . nitrofurantoin (MACRODANTIN) 100 MG capsule Take 100 mg by mouth 2 (two) times daily.   03/10/2015 at Unknown time  . ondansetron (ZOFRAN) 4 MG tablet Take 1 tablet (4 mg total) by mouth every 8 (eight) hours as needed for nausea or vomiting. (Patient not taking: Reported on 03/10/2015) 5 tablet 0 more than one month  . Prenatal Vit-Fe Fumarate-FA (PRENATAL MULTIVITAMIN) TABS  tablet Take 1 tablet by mouth daily at 12 noon.   03/10/2015 at Unknown time    ROS:  Back pain, chills Physical Exam  CLINICAL DATA: Back pain only today  EXAM: OBSTETRIC <14 WK [place] AND TRANSVAGINAL OB US  TECHNIQUE: Both transabdominal and transvaginal ultrasound examinations were performed for complete evaluation of the gestation as well as the maternal uterus, adnexal regions, and pelvic cul-de-sac. Transvaginal technique was performed to assess early pregnancy.  COMPARISON: None.  FINDINGS: Intrauterine gestational sac: Visualized/normal in shape.  Yolk sac: Yes  Embryo: Yes  Cardiac Activity: No  CRL: 23 mm 9 w is a d [place] EDC: 10/13/2015  Maternal uterus/adnexae: Normal  IMPRESSION: Intrauterine demise- single intrauterine gestation measuring 9 weeks 0 days by crown-rump length, with no visible cardiac activity.   Electronically Signed  By: Andreas Newport M.D.  On: 03/11/2015 00:44  Results for orders placed or performed during the hospital encounter of 03/10/15 (from the past 24 hour(s))  Urinalysis, Routine w reflex microscopic     Status: Abnormal   Collection Time: 03/10/15 10:09 PM  Result Value Ref Range   Color, Urine YELLOW YELLOW   APPearance CLEAR CLEAR   Specific Gravity, Urine 1.025 1.005 - 1.030   pH 5.5 5.0 - 8.0   Glucose, UA NEGATIVE NEGATIVE mg/dL   Hgb urine  dipstick NEGATIVE NEGATIVE   Bilirubin Urine NEGATIVE NEGATIVE   Ketones, ur 40 (A) NEGATIVE mg/dL   Protein, ur NEGATIVE NEGATIVE mg/dL   Urobilinogen, UA 0.2 0.0 - 1.0 mg/dL   Nitrite NEGATIVE NEGATIVE   Leukocytes, UA NEGATIVE NEGATIVE  CBC with Differential/Platelet     Status: Abnormal   Collection Time: 03/10/15 10:31 PM  Result Value Ref Range   WBC 5.7 4.0 - 10.5 K/uL   RBC 4.37 3.87 - 5.11 MIL/uL   Hemoglobin 13.2 12.0 - 15.0 g/dL   HCT 36.5 36.0 - 46.0 %   MCV 83.5 78.0 - 100.0 fL   MCH 30.2 26.0 - 34.0 pg   MCHC 36.2 (H)  30.0 - 36.0 g/dL   RDW 11.8 11.5 - 15.5 %   Platelets 220 150 - 400 K/uL   Neutrophils Relative % 67 43 - 77 %   Neutro Abs 3.9 1.7 - 7.7 K/uL   Lymphocytes Relative 20 12 - 46 %   Lymphs Abs 1.1 0.7 - 4.0 K/uL   Monocytes Relative 12 3 - 12 %   Monocytes Absolute 0.7 0.1 - 1.0 K/uL   Eosinophils Relative 1 0 - 5 %   Eosinophils Absolute 0.1 0.0 - 0.7 K/uL   Basophils Relative 0 0 - 1 %   Basophils Absolute 0.0 0.0 - 0.1 K/uL  Comprehensive metabolic panel     Status: Abnormal   Collection Time: 03/10/15 10:31 PM  Result Value Ref Range   Sodium 134 (L) 135 - 145 mmol/L   Potassium 3.5 3.5 - 5.1 mmol/L   Chloride 102 101 - 111 mmol/L   CO2 24 22 - 32 mmol/L   Glucose, Bld 84 70 - 99 mg/dL   BUN 7 6 - 20 mg/dL   Creatinine, Ser 0.43 (L) 0.44 - 1.00 mg/dL   Calcium 9.0 8.9 - 10.3 mg/dL   Total Protein 6.7 6.5 - 8.1 g/dL   Albumin 4.2 3.5 - 5.0 g/dL   AST 28 15 - 41 U/L   ALT 41 14 - 54 U/L   Alkaline Phosphatase 52 38 - 126 U/L   Total Bilirubin 0.4 0.3 - 1.2 mg/dL   GFR calc non Af Amer >60 >60 mL/min   GFR calc Af Amer >60 >60 mL/min   Anion gap 8 5 - 15     Physical Exam: Gen: Tearful Lungs: CTAB CV: RRR w/o murmur Abdomen: soft, NT, no guarding or rebound Back: Neg CVAT bilaterally Ext: WNL  ED Course  Assessment: IUFD at 9.0 wks.  Recurrent UTI - given Rocephin H/O bipolar/anxiety/depression H/O substance abuse, currently in recovery. Declines narcotics. Pt A positive as of 02/28/15.  Plan: Reviewed options of management for IUFD. Pt strongly desires D&E. Dr. Nelda Marseille notified. Case scheduled for 03/11/15 @ 09:15 AM with Dr. Alesia Richards. Support to patient for loss.   Farrel Gordon CNM, MS 03/11/2015 7:18 AM  Patient was advised to continue her stay at [facility] where she can continue to get support that's needed through this difficult time.  She was also given at least of other alternative sources of support. Patient's here with the boyfriend and all of this  information was also given to the boyfriend.  Dr. Alesia Richards.

## 2015-03-14 ENCOUNTER — Encounter (HOSPITAL_COMMUNITY): Payer: Self-pay | Admitting: Obstetrics & Gynecology

## 2016-01-13 ENCOUNTER — Encounter (HOSPITAL_COMMUNITY): Payer: Self-pay | Admitting: *Deleted

## 2017-02-05 ENCOUNTER — Other Ambulatory Visit: Payer: Self-pay | Admitting: Sports Medicine

## 2017-02-05 DIAGNOSIS — M545 Low back pain: Secondary | ICD-10-CM

## 2017-02-05 DIAGNOSIS — M25551 Pain in right hip: Secondary | ICD-10-CM

## 2017-02-22 ENCOUNTER — Ambulatory Visit
Admission: RE | Admit: 2017-02-22 | Discharge: 2017-02-22 | Disposition: A | Discharge status: Home / Self Care | Payer: BLUE CROSS/BLUE SHIELD | Source: Ambulatory Visit | Attending: Sports Medicine | Admitting: Sports Medicine

## 2017-02-22 DIAGNOSIS — M25551 Pain in right hip: Secondary | ICD-10-CM

## 2017-02-22 DIAGNOSIS — M545 Low back pain: Secondary | ICD-10-CM

## 2017-02-22 MED ORDER — IOPAMIDOL (ISOVUE-M 200) INJECTION 41%
15.0000 mL | Freq: Once | INTRAMUSCULAR | Status: AC
  Administered 2017-02-22: 15 mL via INTRA_ARTICULAR

## 2017-03-23 ENCOUNTER — Ambulatory Visit (HOSPITAL_COMMUNITY)
Admission: EM | Admit: 2017-03-23 | Discharge: 2017-03-23 | Disposition: A | Discharge status: Home / Self Care | Payer: BLUE CROSS/BLUE SHIELD | Attending: Internal Medicine | Admitting: Internal Medicine

## 2017-03-23 ENCOUNTER — Encounter (HOSPITAL_COMMUNITY): Payer: Self-pay | Admitting: Family Medicine

## 2017-03-23 DIAGNOSIS — L237 Allergic contact dermatitis due to plants, except food: Secondary | ICD-10-CM | POA: Diagnosis not present

## 2017-03-23 NOTE — ED Provider Notes (Signed)
[facility]    CSN: [id number] Arrival date & time: 03/23/17  1557     History   Chief Complaint Chief Complaint  Patient presents with  . Rash  . Fatigue    HPI Karen [name] is a 26 y.o. female. She presents today with some very small spots around her navel, started about 3 days ago. Not itchy or sore. Works as a Industrial/product designer, and her boss was concerned that this might be shingles. Has not been exposed to ticks. She has been outside in the sun, and actually has a rash on her right lateral abdomen which she splashed lime juice on herself and was in the sun. No fever, no new malaise. Also has a flat purplish spot on her dorsal right foot, slowly enlarging. This has been treated with antifungal medications in the past, without success.      HPI  Past Medical History:  Diagnosis Date  . Chronic UTI   . Endometriosis     Patient Active Problem List   Diagnosis Date Noted  . Bipolar 1 disorder ([place]) 03/10/2015  . Chronic depression 03/10/2015  . Chronic anxiety 03/10/2015  . Recurrent UTI 03/10/2015  . Allergy history, drug--Cipro 03/10/2015  . Hx of substance abuse--staying at [facility] 03/10/2015  . Back pain 03/10/2015    Past Surgical History:  Procedure Laterality Date  . DIAGNOSTIC LAPAROSCOPY  2014  . DILATION AND EVACUATION N/A 03/11/2015   Procedure: DILATATION AND EVACUATION;  Surgeon: Waymon Amato, MD;  Location: [facility];  Service: Gynecology;  Laterality: N/A;  . TONSILLECTOMY  2015    OB History    Gravida Para Term Preterm AB Living   1             SAB TAB Ectopic Multiple Live Births                   Home Medications    Prior to Admission medications   Medication Sig Start Date End Date Taking? Authorizing Provider  hydrOXYzine (ATARAX/VISTARIL) 10 MG tablet Take 10 mg by mouth 3 (three) times daily as needed.   Yes [provider]  lamoTRIgine (LAMICTAL) 25 MG tablet Take 50 mg by mouth daily.   Yes [provider]  naproxen (NAPROSYN) 500 MG tablet Take 500 mg by mouth 2 (two) times daily with a meal.   Yes [provider]  ibuprofen (ADVIL,MOTRIN) 800 MG tablet Take 1 tablet (800 mg total) by mouth every 8 (eight) hours as needed. 03/11/15   Waymon Amato, MD  Prenatal Vit-Fe Fumarate-FA (PRENATAL MULTIVITAMIN) TABS tablet Take 1 tablet by mouth daily at 12 noon.    [provider]  zolpidem (AMBIEN) 10 MG tablet Take 1 tablet (10 mg total) by mouth at bedtime as needed for sleep. 03/11/15 04/10/15  Waymon Amato, MD    Family History History reviewed. No pertinent family history.  Social History Social History  Substance Use Topics  . Smoking status: Current Every Day Smoker    Packs/day: 1.00    Types: Cigarettes  . Smokeless tobacco: Never Used  . Alcohol use No     Allergies   Ciprofloxacin   Review of Systems Review of Systems  All other systems reviewed and are negative.    Physical Exam Triage Vital Signs ED Triage Vitals [03/23/17 1633]  Enc Vitals Group     BP (!) 100/48     Pulse Rate 60     Resp 18  Temp 98.4 F (36.9 C)     Temp Source Oral     SpO2 100 %     Weight      Height      Pain Score      Pain Loc    Updated Vital Signs BP (!) 100/48   Pulse 60   Temp 98.4 F (36.9 C) (Oral)   Resp 18   LMP 03/23/2017   SpO2 100%   Physical Exam  Constitutional: She is oriented to person, place, and time. No distress.  HENT:  Head: Atraumatic.  Eyes:  Conjugate gaze observed, no eye redness/discharge  Neck: Neck supple.  Cardiovascular: Normal rate.   Pulmonary/Chest: No respiratory distress.  Abdominal: She exhibits no distension.  Musculoskeletal: Normal range of motion.  Neurological: She is alert and oriented to person, place, and time.  Skin: Skin is warm and dry.  2 mm partially blanching papules, reddish brown, scattered around the navel. No vesicles or pustules. Not tender. Dorsal right foot with a purplish blanching 4 cm area, not  palpable. Appears vascular. No scale. Not indurated.  Nursing note and vitals reviewed.    UC Treatments / Results   Procedures Procedures (including critical care time) None today  Final Clinical Impressions(s) / UC Diagnoses   Final diagnoses:  Acute phytophotodermatitis   Rash on abdomen is suggestive of irritation from combination of sun/heat/plant juice, although insect bites (mosquito) can also play a role.  Anticipate gradual improvement over the next couple weeks.  Would not expect it to get markedly worse.  Not shingles.  Does not appear contagious.   Foot rash may be a collection of small blood vessels, like a hemangioma.  Sometimes seen as birthmarks.  A dermatologist can help with more definitive diagnosis and management.  Appearance today is not consistent with a fungal rash.      Sherlene Shams, MD 03/23/17 2134

## 2017-03-23 NOTE — Discharge Instructions (Addendum)
Rash on abdomen is suggestive of irritation from combination of sun/heat/plant juice, although insect bites (mosquito) can also play a role.  Anticipate gradual improvement over the next couple weeks.  Would not expect it to get markedly worse.  Not shingles.  Does not appear contagious.   Foot rash may be a collection of small blood vessels, like a hemangioma.  Sometimes seen as birthmarks.  A dermatologist can help with more definitive diagnosis and management.  Appearance today is not consistent with a fungal rash.

## 2017-03-23 NOTE — ED Triage Notes (Signed)
Pt here for rash top abdomen that is spreading x 3 days. Denies itching. sts some fatigue. Denies any bug bites.

## 2017-06-19 ENCOUNTER — Encounter (HOSPITAL_COMMUNITY): Payer: Self-pay | Admitting: Emergency Medicine

## 2017-06-19 ENCOUNTER — Ambulatory Visit (HOSPITAL_COMMUNITY)
Admission: EM | Admit: 2017-06-19 | Discharge: 2017-06-19 | Disposition: A | Discharge status: Home / Self Care | Payer: BLUE CROSS/BLUE SHIELD | Attending: Family Medicine | Admitting: Family Medicine

## 2017-06-19 DIAGNOSIS — Z3201 Encounter for pregnancy test, result positive: Secondary | ICD-10-CM | POA: Diagnosis not present

## 2017-06-19 LAB — POCT PREGNANCY, URINE: PREG TEST UR: POSITIVE — AB

## 2017-06-19 NOTE — ED Triage Notes (Signed)
PT is here for a pregnancy test. LMP was 7/14

## 2017-06-20 NOTE — ED Provider Notes (Signed)
  [place]   [id number] 06/19/17 Arrival Time: 1859  ASSESSMENT & PLAN:  1. Positive pregnancy test    Copy of + test given to pt. Will schedule OB follow up. May f/u here as needed.  Reviewed expectations re: course of current medical issues. Questions answered. Outlined signs and symptoms indicating need for more acute intervention. Patient verbalized understanding. After Visit Summary given.   SUBJECTIVE:  Karen [name] is a 26 y.o. female who presents requesting pregnancy test. Has been trying to become pregnant for quite some time. Miscarriage a few years ago. Positive test at home. Patient's last menstrual period was 05/18/2017. No other concerns.  ROS: As per HPI.   OBJECTIVE:  Vitals:   06/19/17 1916 06/19/17 1918 06/19/17 1920  BP:   103/62  Pulse:  73   Resp:  16   Temp:  98.5 F (36.9 C)   TempSrc:  Oral   SpO2:  99%   Weight: 104 lb (47.2 kg)    Height: 5\' 1"  (1.549 m)       General appearance: alert; no distress Psychological:  alert and cooperative; normal mood and affect  Results for orders placed or performed during the hospital encounter of 06/19/17  Pregnancy, urine POC  Result Value Ref Range   Preg Test, Ur POSITIVE (A) NEGATIVE    Labs Reviewed  POCT PREGNANCY, URINE - Abnormal; Notable for the following:       Result Value   Preg Test, Ur POSITIVE (*)    All other components within normal limits     Allergies  Allergen Reactions  . Ciprofloxacin Hives    PMHx, SurgHx, SocialHx, Medications, and Allergies were reviewed in the Visit Navigator and updated as appropriate.      Vanessa Kick, MD 06/20/17 [phone]

## 2017-07-04 LAB — OB RESULTS CONSOLE GC/CHLAMYDIA
CHLAMYDIA, DNA PROBE: NEGATIVE
GC PROBE AMP, GENITAL: NEGATIVE

## 2017-07-04 LAB — OB RESULTS CONSOLE ABO/RH: RH TYPE: POSITIVE

## 2017-07-04 LAB — OB RESULTS CONSOLE HEPATITIS B SURFACE ANTIGEN: HEP B S AG: NEGATIVE

## 2017-07-04 LAB — OB RESULTS CONSOLE RUBELLA ANTIBODY, IGM: RUBELLA: IMMUNE

## 2017-07-04 LAB — OB RESULTS CONSOLE ANTIBODY SCREEN: ANTIBODY SCREEN: NEGATIVE

## 2017-07-04 LAB — OB RESULTS CONSOLE HIV ANTIBODY (ROUTINE TESTING): HIV: NONREACTIVE

## 2017-07-04 LAB — OB RESULTS CONSOLE RPR: RPR: NONREACTIVE

## 2017-07-16 ENCOUNTER — Inpatient Hospital Stay (HOSPITAL_COMMUNITY): Payer: BLUE CROSS/BLUE SHIELD

## 2017-07-16 ENCOUNTER — Inpatient Hospital Stay (HOSPITAL_COMMUNITY)
Admission: AD | Admit: 2017-07-16 | Discharge: 2017-07-16 | Disposition: A | Discharge status: Home / Self Care | Payer: BLUE CROSS/BLUE SHIELD | Source: Ambulatory Visit | Attending: Obstetrics and Gynecology | Admitting: Obstetrics and Gynecology

## 2017-07-16 ENCOUNTER — Encounter (HOSPITAL_COMMUNITY): Payer: Self-pay | Admitting: *Deleted

## 2017-07-16 DIAGNOSIS — O26891 Other specified pregnancy related conditions, first trimester: Secondary | ICD-10-CM | POA: Diagnosis not present

## 2017-07-16 DIAGNOSIS — F17210 Nicotine dependence, cigarettes, uncomplicated: Secondary | ICD-10-CM | POA: Diagnosis not present

## 2017-07-16 DIAGNOSIS — Z3A08 8 weeks gestation of pregnancy: Secondary | ICD-10-CM | POA: Diagnosis not present

## 2017-07-16 DIAGNOSIS — R102 Pelvic and perineal pain: Secondary | ICD-10-CM | POA: Insufficient documentation

## 2017-07-16 DIAGNOSIS — Z79899 Other long term (current) drug therapy: Secondary | ICD-10-CM | POA: Insufficient documentation

## 2017-07-16 DIAGNOSIS — R109 Unspecified abdominal pain: Secondary | ICD-10-CM | POA: Diagnosis present

## 2017-07-16 DIAGNOSIS — O26899 Other specified pregnancy related conditions, unspecified trimester: Secondary | ICD-10-CM

## 2017-07-16 DIAGNOSIS — O99331 Smoking (tobacco) complicating pregnancy, first trimester: Secondary | ICD-10-CM | POA: Diagnosis not present

## 2017-07-16 LAB — CBC
HEMATOCRIT: 35.8 % — AB (ref 36.0–46.0)
Hemoglobin: 12.6 g/dL (ref 12.0–15.0)
MCH: 30.7 pg (ref 26.0–34.0)
MCHC: 35.2 g/dL (ref 30.0–36.0)
MCV: 87.1 fL (ref 78.0–100.0)
Platelets: 238 10*3/uL (ref 150–400)
RBC: 4.11 MIL/uL (ref 3.87–5.11)
RDW: 12.8 % (ref 11.5–15.5)
WBC: 9.6 10*3/uL (ref 4.0–10.5)

## 2017-07-16 LAB — HCG, QUANTITATIVE, PREGNANCY: hCG, Beta Chain, Quant, S: 103485 m[IU]/mL — ABNORMAL HIGH (ref <5)

## 2017-07-16 NOTE — MAU Note (Signed)
Having abd tightness, pain in abd  sicne early this morning.  Seen in office, they sent her here.

## 2017-07-16 NOTE — MAU Note (Signed)
Chief Complaint: Abdominal Pain   None    SUBJECTIVE HPI: Karen [name] is a 26 y.o. G2P0 at [redacted]w[redacted]d who presents to Maternity Admissions reporting abdominal cramping in pregnancy.  Denies bleeding or urinary frequency.  Was seen in office this am and cultures sent.  Currently being treated for UTI.  On Augmentin 875mg  BID last day tomorrow.  Had viability [place] a [facility] at 6.5 with heart beat.  Has hx of MAB at 9 weeks.  Location: lower abd Quality: cramping Severity: 4/10 on pain scale Duration: one day Modifying factors: Has not taken anything Associated signs and symptoms: None  Past Medical History:  Diagnosis Date  . Chronic UTI   . Endometriosis    OB History  Gravida Para Term Preterm AB Living  2            SAB TAB Ectopic Multiple Live Births               # Outcome Date GA Lbr Len/2nd Weight Sex Delivery Anes PTL Lv  2 Current           1 Gravida              Past Surgical History:  Procedure Laterality Date  . DIAGNOSTIC LAPAROSCOPY  2014  . DILATION AND EVACUATION N/A 03/11/2015   Procedure: DILATATION AND EVACUATION;  Surgeon: Waymon Amato, MD;  Location: [facility];  Service: Gynecology;  Laterality: N/A;  . TONSILLECTOMY  2015   Social History   Social History  . Marital status: Single    Spouse name: N/A  . Number of children: N/A  . Years of education: N/A   Occupational History  . Not on file.   Social History Main Topics  . Smoking status: Current Every Day Smoker    Packs/day: 1.00    Types: Cigarettes  . Smokeless tobacco: Never Used  . Alcohol use No  . Drug use: Yes    Types: Heroin, Cocaine, Marijuana     Comment: Clean since Aug 2015  . Sexual activity: Yes    Birth control/ protection: Pill, Injection   Other Topics Concern  . Not on file   Social History Narrative  . No narrative on file   History reviewed. No pertinent family history. No current facility-administered medications on file prior to encounter.    Current  Outpatient Prescriptions on File Prior to Encounter  Medication Sig Dispense Refill  . hydrOXYzine (ATARAX/VISTARIL) 10 MG tablet Take 10 mg by mouth 3 (three) times daily as needed for anxiety.     . Prenatal Vit-Fe Fumarate-FA (PRENATAL MULTIVITAMIN) TABS tablet Take 1 tablet by mouth daily at 12 noon.     Allergies  Allergen Reactions  . Ciprofloxacin Hives    I have reviewed patient's Past Medical Hx, Surgical Hx, Family Hx, Social Hx, medications and allergies.   Review of Systems  OBJECTIVE Patient Vitals for the past 24 hrs:  BP Temp Temp src Pulse Resp SpO2 Weight  07/16/17 1836 (!) 92/50 98.5 F (36.9 C) Oral 65 18 100 % 48.5 kg (107 lb)   Constitutional: Well-developed, well-nourished female in no acute distress.  Cardiovascular: normal rate Respiratory: normal rate and effort.  GI: Abd soft, non-tender MS: Extremities nontender, no edema, normal ROM Neurologic: Alert and oriented x 4.  GU: Deferred done in office LAB RESULTS Results for orders placed or performed during the hospital encounter of 07/16/17 (from the past 24 hour(s))  CBC     Status: Abnormal  Collection Time: 07/16/17  7:17 PM  Result Value Ref Range   WBC 9.6 4.0 - 10.5 K/uL   RBC 4.11 3.87 - 5.11 MIL/uL   Hemoglobin 12.6 12.0 - 15.0 g/dL   HCT 35.8 (L) 36.0 - 46.0 %   MCV 87.1 78.0 - 100.0 fL   MCH 30.7 26.0 - 34.0 pg   MCHC 35.2 30.0 - 36.0 g/dL   RDW 12.8 11.5 - 15.5 %   Platelets 238 150 - 400 K/uL    IMAGING 7.6 week IUP Small subchorionic hemorrhage  MAU COURSE Orders Placed This Encounter  Procedures  . US OB Comp Less 14 Wks  . CBC  . hCG, quantitative, pregnancy   Meds ordered this encounter  Medications  . lamoTRIgine (LAMICTAL) 200 MG tablet    Sig: Take 200 mg by mouth daily.  . ValACYclovir HCl (VALTREX PO)    Sig: Take 1 tablet by mouth daily as needed (flare up).  Marland Kitchen amoxicillin (AMOXIL) 500 MG capsule    Sig: Take 500 mg by mouth 2 (two) times daily.  Marland Kitchen  acetaminophen (TYLENOL) 500 MG tablet    Sig: Take 1,000 mg by mouth every 6 (six) hours as needed for moderate pain.    MDM Labs and [place] ASSESSMENT 1. Pelvic pain affecting pregnancy     PLAN Discharge home in stable condition. SAB precautions  PNV   Starla Link, CNM 07/16/2017  8:19 PM

## 2017-08-15 ENCOUNTER — Ambulatory Visit: Payer: BLUE CROSS/BLUE SHIELD | Attending: Sports Medicine

## 2017-08-15 DIAGNOSIS — G8929 Other chronic pain: Secondary | ICD-10-CM | POA: Diagnosis present

## 2017-08-15 DIAGNOSIS — M62830 Muscle spasm of back: Secondary | ICD-10-CM | POA: Insufficient documentation

## 2017-08-15 DIAGNOSIS — M545 Low back pain: Secondary | ICD-10-CM | POA: Insufficient documentation

## 2017-08-15 NOTE — Patient Instructions (Signed)
From cabinet posterior pelvic tilt  And clam and ball squeeze 2x/day  10-15 reps cued to og easy. Blue baND issued

## 2017-08-16 NOTE — Therapy (Signed)
[place], [place], [address] Phone: [phone]   Fax:  [phone]  Physical Therapy Evaluation  Patient Details  Name: Karen [name] MRN: [id number] Date of Birth: [birth year] Referring Provider: Vickki Hearing, MD  Encounter Date: 08/15/2017      PT End of Session - 08/15/17 1632    Visit Number 1   Number of Visits 12   Date for PT Re-Evaluation 10/11/17   Authorization Type BCBS   PT Start Time 0335   PT Stop Time 0425   PT Time Calculation (min) 50 min   Activity Tolerance Patient tolerated treatment well;No increased pain   Behavior During Therapy WFL for tasks assessed/performed      Past Medical History:  Diagnosis Date  . Chronic UTI   . Endometriosis     Past Surgical History:  Procedure Laterality Date  . DIAGNOSTIC LAPAROSCOPY  2014  . DILATION AND EVACUATION N/A 03/11/2015   Procedure: DILATATION AND EVACUATION;  Surgeon: Waymon Amato, MD;  Location: [facility];  Service: Gynecology;  Laterality: N/A;  . TONSILLECTOMY  2015    There were no vitals filed for this visit.       Subjective Assessment - 08/15/17 1537    Subjective She reports chronic back and hip pain.  PT prior to pregnancy.   She was doing traction and exercises.  She received 18 weeks of PT prior to stopping.   Wants exercise to strengthen back for added weight during pregnacy. ..   She report 3 years of LBP  and hip pain .     She felt she    Pertinent History Steroid injections in May 2018.  No benefit.       Pregnant [redacted] weeks   Limitations --  getting out of low chairs . causes tingling in  feeling like leg feel asleep/, walking stairs,  limits lifting,   limits bending   , problems sleeping   How long can you sit comfortably? 15 min    How long can you stand comfortably? 6 hours   How long can you walk comfortably? 1.5 miles   Diagnostic tests Xrays and MRI and arthrogram;   Negative for all.       Patient Stated  Goals She wants to get stronger.    Currently in Pain? Yes   Pain Score 7    Pain Location Back   Pain Orientation Right;Left;Lower   Pain Descriptors / Indicators Aching;Sharp   Pain Type Chronic pain   Pain Radiating Towards Whole RT leg    Pain Onset More than a month ago   Pain Frequency Constant   Aggravating Factors  See above   Pain Relieving Factors ice, change position   Multiple Pain Sites No            [facility] PT Assessment - 08/15/17 0001      Assessment   Medical Diagnosis Chronic hip and back pain   Referring Provider Vickki Hearing, MD   Onset Date/Surgical Date --  3 years ago   Next MD Visit 3 weeks    Prior Therapy 18 weeks of therapy within last year     Precautions   Precaution Comments PT IS [redacted] WEEKS PREGNANT     Restrictions   Weight Bearing Restrictions No     Balance Screen   Has the patient fallen in the past 6 months No   Has the patient had a decrease in activity level because of a  fear of falling?  No   Is the patient reluctant to leave their home because of a fear of falling?  No     Prior Function   Level of Independence Independent     Cognition   Overall Cognitive Status Within Functional Limits for tasks assessed     Posture/Postural Control   Posture/Postural Control No significant limitations     ROM / Strength   AROM / PROM / Strength AROM;Strength     AROM   Overall AROM Comments prone hip IR LT 45 degrees,  RT 38 degrees,    hip ext 5 inches LT   3 inches RT.    AROM Assessment Site Lumbar   Lumbar Flexion 90   Lumbar Extension 30   Lumbar - Right Side Bend 30   Lumbar - Left Side Bend 30     Strength   Overall Strength Comments RT and LT leg with verbal encouragement were WNL     Flexibility   Soft Tissue Assessment /Muscle Length yes   Hamstrings assisted to 80 degrees     Palpation   Palpation comment RT ASIS lower than Lt in supine            Objective measurements completed on examination: See above  findings.          [facility] Adult PT Treatment/Exercise - 08/15/17 0001      Lumbar Exercises: Stretches   Pelvic Tilt Limitations 5 reps x 5 sec      Lumbar Exercises: Supine   Other Supine Lumbar Exercises ball sqeeze x 5 , 5 sec then clams into blue band x 5   , 5 sec.       Manual Therapy   Manual Therapy Muscle Energy Technique;Joint mobilization   Joint Mobilization sustained with deep breathing  Pa pressure into LT sid eof sacrum  GR 4                 PT Education - 08/15/17 1632    Education provided Yes   Education Details POC, HEP   Person(s) Educated Patient   Methods Explanation;Tactile cues;Verbal cues;Handout   Comprehension Returned demonstration;Verbalized understanding          PT Short Term Goals - 08/16/17 0607      PT SHORT TERM GOAL #1   Title She will be independent with initial HEP for strength and stabilization   Time 3   Period Weeks   Status New           PT Long Term Goals - 08/16/17 0607      PT LONG TERM GOAL #1   Title She will be independent with all HEP issued    Time 3   Period Weeks   Status New     PT LONG TERM GOAL #2   Title She will report pain decreased 30% or more with sitting with support for more than 30 min   Time 6   Period Weeks   Status New     PT LONG TERM GOAL #3   Title she will report able to work with 30 % less pain.    Time 6   Period Weeks   Status New                Plan - 08/15/17 1633    Clinical Impression Statement Karen [name] presents with chronic lower back pain RT > LT with RT hip and sometimes RT LE pain and parasthesisas.  She  report whole RT leg "going to sleep".  She reports  no one knows why she is having this pain.   She has had extensive PT this past year with minimal to no benefit   She is now pregnant.  Will educatte on use of SI/pregnacy support with braceing   Clinical Presentation Stable   Clinical Presentation due to: chronic hip and back pain   Clinical Decision  Making Low   Rehab Potential Good   PT Frequency 1x / week   PT Duration 4 weeks  then 2x/week for 4 weeks if improving   PT Treatment/Interventions Cryotherapy;Patient/family education;Manual techniques;Taping;Therapeutic exercise;Therapeutic activities   PT Next Visit Plan Begin Pilates based stabilization exercies   PT Home Exercise Plan PPT and  ball squeeze and clam blue band   Consulted and Agree with Plan of Care Patient      Patient will benefit from skilled therapeutic intervention in order to improve the following deficits and impairments:  Pain, Decreased strength, Decreased activity tolerance, Decreased range of motion, Increased muscle spasms  Visit Diagnosis: Chronic right-sided low back pain, with sciatica presence unspecified - Plan: PT plan of care cert/re-cert  Muscle spasm of back - Plan: PT plan of care cert/re-cert     Problem List Patient Active Problem List   Diagnosis Date Noted  . Bipolar 1 disorder ([place]) 03/10/2015  . Chronic depression 03/10/2015  . Chronic anxiety 03/10/2015  . Recurrent UTI 03/10/2015  . Allergy history, drug--Cipro 03/10/2015  . Hx of substance abuse--staying at [facility] 03/10/2015  . Back pain 03/10/2015    Darrel Hoover  PT 08/16/2017, 6:21 AM  [facility] [address] [place], [place], [address] Phone: [phone]   Fax:  [phone]  Name: Karen [name] MRN: [id number] Date of Birth: 06/25/1991

## 2017-08-27 ENCOUNTER — Ambulatory Visit: Payer: BLUE CROSS/BLUE SHIELD

## 2017-08-27 DIAGNOSIS — M545 Low back pain: Secondary | ICD-10-CM | POA: Diagnosis not present

## 2017-08-27 DIAGNOSIS — M62830 Muscle spasm of back: Secondary | ICD-10-CM

## 2017-08-27 DIAGNOSIS — G8929 Other chronic pain: Secondary | ICD-10-CM

## 2017-08-27 NOTE — Patient Instructions (Signed)
Issued clamshell and shoulder bridge L1  10 reps for home RTT/LT

## 2017-08-27 NOTE — Therapy (Signed)
[place], [place], [address] Phone: [phone]   Fax:  [phone]  Physical Therapy Treatment  Patient Details  Name: Karen [name] MRN: [id number] Date of Birth: [birth year] Referring Provider: Vickki Hearing, MD  Encounter Date: 08/27/2017      PT End of Session - 08/27/17 1545    Visit Number 2   Number of Visits 12   Date for PT Re-Evaluation 10/11/17   Authorization Type BCBS   PT Start Time 0345   PT Stop Time 0430   PT Time Calculation (min) 45 min   Activity Tolerance Patient tolerated treatment well   Behavior During Therapy [facility] for tasks assessed/performed      Past Medical History:  Diagnosis Date  . Chronic UTI   . Endometriosis     Past Surgical History:  Procedure Laterality Date  . DIAGNOSTIC LAPAROSCOPY  2014  . DILATION AND EVACUATION N/A 03/11/2015   Procedure: DILATATION AND EVACUATION;  Surgeon: Waymon Amato, MD;  Location: [facility];  Service: Gynecology;  Laterality: N/A;  . TONSILLECTOMY  2015    There were no vitals filed for this visit.      Subjective Assessment - 08/27/17 1547    Subjective She report no changes.  Pain worse this AM Left work early. Eventually worked it out    Currently in Pain? Yes   Pain Score 5    Pain Location Back   Pain Orientation Right;Left;Lower   Pain Descriptors / Indicators Aching;Sharp   Pain Type Chronic pain   Pain Radiating Towards noo leg pain now   Pain Onset More than a month ago   Pain Frequency Constant   Multiple Pain Sites No                         [facility] Adult PT Treatment/Exercise - 08/27/17 0001      Lumbar Exercises: Stretches   Pelvic Tilt Limitations 5 reps x 5 sec      Lumbar Exercises: Supine   Bridge Limitations Shoulder bridge  x10 cued for technique   Other Supine Lumbar Exercises ball sqeeze x 5 , 5 sec then clams into blue band x 5   , 5 sec.     Other Supine Lumbar Exercises  Stabilization with hip 90 degrees abd/add      Lumbar Exercises: Sidelying   Clam 15 reps   Clam Limitations RT/Lt mixed with feet on table and in air.      Lumbar Exercises: Quadruped   Opposite Arm/Leg Raise Right arm/Left leg;Left arm/Right leg;10 reps   Plank on all 4's x 6 hold 3 sec lifting knees 2-3 inches off mat.     Knee/Hip Exercises: Aerobic   Stationary Bike L2 6 min     Manual Therapy   Manual Therapy Muscle Energy Technique;Joint mobilization   Muscle Energy Technique For anterior ilia on RT , level post technique                 PT Education - 08/27/17 1632    Education provided Yes   Education Details shoulder bridge, clamshell   Person(s) Educated Patient   Methods Explanation;Demonstration;Tactile cues;Verbal cues;Handout   Comprehension Returned demonstration;Verbalized understanding          PT Short Term Goals - 08/16/17 0607      PT SHORT TERM GOAL #1   Title She will be independent with initial HEP for strength and stabilization   Time  3   Period Weeks   Status New           PT Long Term Goals - 08/16/17 0607      PT LONG TERM GOAL #1   Title She will be independent with all HEP issued    Time 3   Period Weeks   Status New     PT LONG TERM GOAL #2   Title She will report pain decreased 30% or more with sitting with support for more than 30 min   Time 6   Period Weeks   Status New     PT LONG TERM GOAL #3   Title she will report able to work with 30 % less pain.    Time 6   Period Weeks   Status New               Plan - 08/27/17 1545    Clinical Impression Statement Asymetric pelvis leveled. She has difficulty with all exercises but was able to do all with reasonable technique.  Agreed to HEP . Progress HEP    PT Treatment/Interventions Cryotherapy;Patient/family education;Manual techniques;Taping;Therapeutic exercise;Therapeutic activities   PT Next Visit Plan Progress Pilates based stabilization exercises,  possible reformer next visit   PT Home Exercise Plan PPT and  ball squeeze and clam blue band   Consulted and Agree with Plan of Care Patient      Patient will benefit from skilled therapeutic intervention in order to improve the following deficits and impairments:  Pain, Decreased strength, Decreased activity tolerance, Decreased range of motion, Increased muscle spasms  Visit Diagnosis: Chronic right-sided low back pain, with sciatica presence unspecified  Muscle spasm of back     Problem List Patient Active Problem List   Diagnosis Date Noted  . Bipolar 1 disorder ([place]) 03/10/2015  . Chronic depression 03/10/2015  . Chronic anxiety 03/10/2015  . Recurrent UTI 03/10/2015  . Allergy history, drug--Cipro 03/10/2015  . Hx of substance abuse--staying at [facility] 03/10/2015  . Back pain 03/10/2015    Darrel Hoover  PT 08/27/2017, 4:34 PM  [facility] [address] [place], [place], [address] Phone: [phone]   Fax:  [phone]  Name: Jarelis Ehlert MRN: [id number] Date of Birth: 09-Nov-1990

## 2017-09-03 ENCOUNTER — Encounter: Payer: Self-pay | Admitting: Physical Therapy

## 2017-09-03 ENCOUNTER — Ambulatory Visit: Payer: BLUE CROSS/BLUE SHIELD | Admitting: Physical Therapy

## 2017-09-03 DIAGNOSIS — M545 Low back pain: Secondary | ICD-10-CM | POA: Diagnosis not present

## 2017-09-03 DIAGNOSIS — G8929 Other chronic pain: Secondary | ICD-10-CM

## 2017-09-03 DIAGNOSIS — M62830 Muscle spasm of back: Secondary | ICD-10-CM

## 2017-09-03 NOTE — Therapy (Signed)
[place], [place], [address] Phone: [phone]   Fax:  [phone]  Physical Therapy Treatment  Patient Details  Name: Karen [name] MRN: [id number] Date of Birth: 30-Jul-1991 Referring Provider: Vickki Hearing, MD  Encounter Date: 09/03/2017      PT End of Session - 09/03/17 1115    Visit Number 3   Number of Visits 12   Date for PT Re-Evaluation 10/11/17   Authorization Type BCBS   PT Start Time 1545   PT Stop Time 1632   PT Time Calculation (min) 47 min   Activity Tolerance Patient tolerated treatment well   Behavior During Therapy [facility] for tasks assessed/performed      Past Medical History:  Diagnosis Date  . Chronic UTI   . Endometriosis     Past Surgical History:  Procedure Laterality Date  . DIAGNOSTIC LAPAROSCOPY  2014  . DILATION AND EVACUATION N/A 03/11/2015   Procedure: DILATATION AND EVACUATION;  Surgeon: Waymon Amato, MD;  Location: [facility];  Service: Gynecology;  Laterality: N/A;  . TONSILLECTOMY  2015    There were no vitals filed for this visit.      Subjective Assessment - 09/03/17 1925    Subjective Pain continues, was very sore after last visit.  Limited to 6 hours per day at work.  Standing and exercises make her hurt more.     Patient Stated Goals Wishes she can exercise but doesnt know how to do so safely.    Currently in Pain? Yes   Pain Score 5    Pain Location Hip   Pain Orientation Right;Lateral;Anterior   Pain Descriptors / Indicators Aching;Sore   Pain Type Chronic pain   Pain Onset More than a month ago   Pain Frequency Constant   Aggravating Factors  see above   Pain Relieving Factors ice, change position , traction helped before              [facility] Adult PT Treatment/Exercise - 09/03/17 0001      Self-Care   Self-Care Posture;Heat/Ice Application;Other Self-Care Comments   Posture alignment, pelvis    Heat/Ice Application either is OK, prefer ice  post ex   Other Self-Care Comments  HEP corrections, safety and lying supine for stretching     Lumbar Exercises: Stretches   Single Knee to Chest Stretch 2 reps;30 seconds   Single Knee to Chest Stretch Limitations opposite shoulder    Lower Trunk Rotation 2 reps;30 seconds   Pelvic Tilt 5 reps;10 seconds   Piriformis Stretch 2 reps;60 seconds     Lumbar Exercises: Supine   Clam 20 reps     Knee/Hip Exercises: Stretches   Hip Flexor Stretch Right;2 reps     Knee/Hip Exercises: Supine   Hip Adduction Isometric Strengthening;Both;1 set;10 reps                PT Education - 09/03/17 1927    Education provided Yes   Education Details MET for pelvis asymmetry, safe core, avoid lifting legs up at the same time from the bed, proper stretching, HEP   Person(s) Educated Patient   Methods Explanation;Handout;Verbal cues;Tactile cues;Demonstration   Comprehension Verbalized understanding;Returned demonstration;Need further instruction          PT Short Term Goals - 09/03/17 1609      PT SHORT TERM GOAL #1   Title She will be independent with initial HEP for strength and stabilization   Baseline needed cues today  Status On-going           PT Long Term Goals - 08/16/17 0375      PT LONG TERM GOAL #1   Title She will be independent with all HEP issued    Time 3   Period Weeks   Status New     PT LONG TERM GOAL #2   Title She will report pain decreased 30% or more with sitting with support for more than 30 min   Time 6   Period Weeks   Status New     PT LONG TERM GOAL #3   Title she will report able to work with 30 % less pain.    Time 6   Period Weeks   Status New               Plan - 09/03/17 1928    Clinical Impression Statement Pain today in Rt. lower back increased with simple hip add and hip abd.  Stretching in [place] test position felt good as did lateral hip with trunk rotation and long axis distraction to Rt. LE. Pt had been continuing  her core routine from prepregnancy but that is no longer appropriate.  She needs more visits in order to learn proper body mechanics, precautions and exercise considerations for pregnancy.    PT Frequency 2x / week   PT Duration 4 weeks   PT Next Visit Plan Progress Pilates based stabilization exercises, possible reformer next visit, manual to R.t hip and lumbar    PT Home Exercise Plan PPT and  ball squeeze and clam blue band   Consulted and Agree with Plan of Care Patient      Patient will benefit from skilled therapeutic intervention in order to improve the following deficits and impairments:  Pain, Decreased strength, Decreased activity tolerance, Decreased range of motion, Increased muscle spasms  Visit Diagnosis: Chronic right-sided low back pain, with sciatica presence unspecified  Muscle spasm of back     Problem List Patient Active Problem List   Diagnosis Date Noted  . Bipolar 1 disorder ([place]) 03/10/2015  . Chronic depression 03/10/2015  . Chronic anxiety 03/10/2015  . Recurrent UTI 03/10/2015  . Allergy history, drug--Cipro 03/10/2015  . Hx of substance abuse--staying at [facility] 03/10/2015  . Back pain 03/10/2015    Genavieve Mangiapane 09/03/2017, 7:36 PM  [place], [place], [address] Phone: [phone]   Fax:  [phone]  Name: Cherith Tewell MRN: [id number] Date of Birth: 11-Apr-1991   Raeford Razor, PT 09/03/17 7:37 PM Phone: [phone] Fax: [phone]

## 2017-09-16 ENCOUNTER — Encounter: Payer: Self-pay | Admitting: Physical Therapy

## 2017-09-16 ENCOUNTER — Ambulatory Visit: Payer: BLUE CROSS/BLUE SHIELD | Attending: Sports Medicine | Admitting: Physical Therapy

## 2017-09-16 DIAGNOSIS — M62830 Muscle spasm of back: Secondary | ICD-10-CM

## 2017-09-16 DIAGNOSIS — M545 Low back pain: Secondary | ICD-10-CM | POA: Insufficient documentation

## 2017-09-16 DIAGNOSIS — G8929 Other chronic pain: Secondary | ICD-10-CM | POA: Insufficient documentation

## 2017-09-16 NOTE — Therapy (Signed)
[place], [place], [address] Phone: [phone]   Fax:  [phone]  Physical Therapy Treatment  Patient Details  Name: Karen [name] MRN: [id number] Date of Birth: Feb 18, 1991 Referring Provider: Vickki Hearing, MD   Encounter Date: 09/16/2017  PT End of Session - 09/16/17 1201    Visit Number  4    Number of Visits  12    Date for PT Re-Evaluation  10/11/17    Authorization Type  BCBS    PT Start Time  1145    PT Stop Time  1230    PT Time Calculation (min)  45 min    Activity Tolerance  Patient tolerated treatment well    Behavior During Therapy  [facility] for tasks assessed/performed       Past Medical History:  Diagnosis Date  . Chronic UTI   . Endometriosis     Past Surgical History:  Procedure Laterality Date  . DIAGNOSTIC LAPAROSCOPY  2014  . TONSILLECTOMY  2015    There were no vitals filed for this visit.  Subjective Assessment - 09/16/17 1149    Subjective  Sees dr. Delilah Shan after this.  Got a new body pillow last night so im feeling good.      Pertinent History  18 weeks     Currently in Pain?  Yes    Pain Score  3     Pain Location  Hip    Pain Orientation  Right;Lateral    Pain Descriptors / Indicators  Aching    Pain Type  Chronic pain    Pain Onset  More than a month ago    Pain Frequency  Constant    Aggravating Factors   mostly work and standing  being busy at work     Pain Relieving Factors  ice , sitting , stretching          [facility] Adult PT Treatment/Exercise - 09/16/17 0001      Lumbar Exercises: Supine   Bent Knee Raise  10 reps      Knee/Hip Exercises: Stretches   Hip Flexor Stretch  Right;1 rep;60 seconds       Pilates Reformer used for LE/core strength, postural strength, lumbopelvic disassociation and core control.  Exercises included: Footwork 2 Red 1 blue for double leg press out with variations of heels, parallel and turnout   Single leg 2 red in  parallel and hip ER x 10 each leg   Mermaid x 5 press out and add rotation for trunk/hip stretching   Sidelying legs for hip and knee strength, stability, 2 red and modified for hip ER to 1 Red 1 blue     PT Education - 09/16/17 1245    Education provided  Yes    Education Details  Pilates , HEP     Person(s) Educated  Patient    Methods  Explanation;Tactile cues;Demonstration;Verbal cues    Comprehension  Verbalized understanding       PT Short Term Goals - 09/16/17 1235      PT SHORT TERM GOAL #1   Title  She will be independent with initial HEP for strength and stabilization    Status  Achieved        PT Long Term Goals - 09/16/17 1230      PT LONG TERM GOAL #1   Title  She will be independent with all HEP issued     Status  On-going      PT LONG  TERM GOAL #2   Title  She will report pain decreased 30% or more with sitting with support for more than 30 min    Baseline  pressure in low back     Status  On-going      PT LONG TERM GOAL #3   Title  Pt will be able to stand for work for 2 hours consecutively with min pain increase in hip pain from baseline.     Status  On-going      PT LONG TERM GOAL #4   Title  Pt will report less instances of waking from hip pain (improved 50%)     Baseline  wakes q 1.5 hours and has to reposition    Status  On-going            Plan - 09/16/17 1202    Clinical Impression Statement  Initiated Pilates Reformer without increase in pain.  Med springs almost too heavy for her, very slow but decent control. Fatigued, felt a good muscle burn but no pain during exercise.      PT Treatment/Interventions  Cryotherapy;Patient/family education;Manual techniques;Taping;Therapeutic exercise;Therapeutic activities    PT Next Visit Plan  Progress Pilates based stabilization exercises, possible reformer next visit, manual to Rt hip and lumbar     PT Home Exercise Plan  PPT and  ball squeeze and clam blue band, anterior hip stretch , march and  lateral hip (mod Piriformis)     Consulted and Agree with Plan of Care  Patient       Patient will benefit from skilled therapeutic intervention in order to improve the following deficits and impairments:  Pain, Decreased strength, Decreased activity tolerance, Decreased range of motion, Increased muscle spasms  Visit Diagnosis: Chronic right-sided low back pain, with sciatica presence unspecified  Muscle spasm of back     Problem List Patient Active Problem List   Diagnosis Date Noted  . Bipolar 1 disorder ([place]) 03/10/2015  . Chronic depression 03/10/2015  . Chronic anxiety 03/10/2015  . Recurrent UTI 03/10/2015  . Allergy history, drug--Cipro 03/10/2015  . Hx of substance abuse--staying at [facility] 03/10/2015  . Back pain 03/10/2015    Karen [name] 09/16/2017, 12:47 PM  [facility] [address] [place], [place], [address] Phone: [phone]   Fax:  [phone]  Name: Karen [name] MRN: [id number] Date of Birth: 01/20/1991  Raeford Razor, PT 09/16/17 12:48 PM Phone: [phone] Fax: [phone]

## 2017-09-23 ENCOUNTER — Inpatient Hospital Stay (HOSPITAL_COMMUNITY)
Admission: AD | Admit: 2017-09-23 | Discharge: 2017-09-23 | Disposition: A | Discharge status: Home / Self Care | Payer: BLUE CROSS/BLUE SHIELD | Source: Ambulatory Visit | Attending: Obstetrics and Gynecology | Admitting: Obstetrics and Gynecology

## 2017-09-23 ENCOUNTER — Encounter (HOSPITAL_COMMUNITY): Payer: Self-pay

## 2017-09-23 ENCOUNTER — Other Ambulatory Visit: Payer: Self-pay

## 2017-09-23 DIAGNOSIS — O26899 Other specified pregnancy related conditions, unspecified trimester: Secondary | ICD-10-CM

## 2017-09-23 DIAGNOSIS — Z87891 Personal history of nicotine dependence: Secondary | ICD-10-CM | POA: Insufficient documentation

## 2017-09-23 DIAGNOSIS — Z3A18 18 weeks gestation of pregnancy: Secondary | ICD-10-CM | POA: Diagnosis not present

## 2017-09-23 DIAGNOSIS — R102 Pelvic and perineal pain: Secondary | ICD-10-CM

## 2017-09-23 DIAGNOSIS — R109 Unspecified abdominal pain: Secondary | ICD-10-CM | POA: Diagnosis present

## 2017-09-23 DIAGNOSIS — Z87440 Personal history of urinary (tract) infections: Secondary | ICD-10-CM | POA: Diagnosis not present

## 2017-09-23 DIAGNOSIS — O26892 Other specified pregnancy related conditions, second trimester: Secondary | ICD-10-CM | POA: Diagnosis not present

## 2017-09-23 DIAGNOSIS — Z881 Allergy status to other antibiotic agents status: Secondary | ICD-10-CM | POA: Insufficient documentation

## 2017-09-23 DIAGNOSIS — Z9889 Other specified postprocedural states: Secondary | ICD-10-CM | POA: Insufficient documentation

## 2017-09-23 DIAGNOSIS — Z79899 Other long term (current) drug therapy: Secondary | ICD-10-CM | POA: Diagnosis not present

## 2017-09-23 LAB — URINALYSIS, ROUTINE W REFLEX MICROSCOPIC
BILIRUBIN URINE: NEGATIVE
Glucose, UA: NEGATIVE mg/dL
HGB URINE DIPSTICK: NEGATIVE
KETONES UR: NEGATIVE mg/dL
LEUKOCYTES UA: NEGATIVE
NITRITE: NEGATIVE
PROTEIN: 30 mg/dL — AB
Specific Gravity, Urine: 1.021 (ref 1.005–1.030)
pH: 7 (ref 5.0–8.0)

## 2017-09-23 LAB — CBC WITH DIFFERENTIAL/PLATELET
Basophils Absolute: 0 10*3/uL (ref 0.0–0.1)
Basophils Relative: 0 %
Eosinophils Absolute: 0.4 10*3/uL (ref 0.0–0.7)
Eosinophils Relative: 4 %
HCT: 37.6 % (ref 36.0–46.0)
Hemoglobin: 13 g/dL (ref 12.0–15.0)
Lymphocytes Relative: 24 %
Lymphs Abs: 2.2 10*3/uL (ref 0.7–4.0)
MCH: 30.7 pg (ref 26.0–34.0)
MCHC: 34.6 g/dL (ref 30.0–36.0)
MCV: 88.7 fL (ref 78.0–100.0)
Monocytes Absolute: 0.3 10*3/uL (ref 0.1–1.0)
Monocytes Relative: 3 %
Neutro Abs: 6.3 10*3/uL (ref 1.7–7.7)
Neutrophils Relative %: 69 %
Platelets: 242 10*3/uL (ref 150–400)
RBC: 4.24 MIL/uL (ref 3.87–5.11)
RDW: 12.3 % (ref 11.5–15.5)
WBC: 9.2 10*3/uL (ref 4.0–10.5)

## 2017-09-23 MED ORDER — IBUPROFEN 400 MG PO TABS
400.0000 mg | ORAL_TABLET | Freq: Once | ORAL | Status: AC
  Administered 2017-09-23: 400 mg via ORAL
  Filled 2017-09-23: qty 1

## 2017-09-23 MED ORDER — OXYCODONE-ACETAMINOPHEN 5-325 MG PO TABS: 2.0000 | ORAL_TABLET | Freq: Once | ORAL | Status: DC

## 2017-09-23 MED ORDER — COMFORT FIT MATERNITY SUPP SM MISC: 1.0000 | Freq: Every day | 0 refills | Status: DC

## 2017-09-23 NOTE — Discharge Instructions (Signed)

## 2017-09-23 NOTE — MAU Provider Note (Signed)
Chief Complaint: Abdominal Pain   First Provider Initiated Contact with Patient 09/23/17 1155      SUBJECTIVE HPI: Karen [name] is a 26 y.o. G2P0010 at [redacted]w[redacted]d by LMP who presents to maternity admissions reporting abdominal pain. Abdominal pain is specified to left side. It is described as a sharp stabbing pain that started at 0530 this morning. She reports that it get worse when she moves and is unable to lay down horizontal whether she lays on her left side or not. Taken tylenol 1000mg  2-3 hours ago with no relief. Currently rating pain 10/10. She denies vaginal bleeding, vaginal itching/burning, urinary symptoms, h/a, dizziness, n/v, or fever/chills.      Past Medical History:  Diagnosis Date  . Chronic UTI   . Endometriosis    Past Surgical History:  Procedure Laterality Date  . DIAGNOSTIC LAPAROSCOPY  2014  . DILATATION AND EVACUATION N/A 03/11/2015   Performed by Waymon Amato, MD at [facility]  . TONSILLECTOMY  2015   Social History   Socioeconomic History  . Marital status: Single    Spouse name: Not on file  . Number of children: Not on file  . Years of education: Not on file  . Highest education level: Not on file  Social Needs  . Financial resource strain: Not on file  . Food insecurity - worry: Not on file  . Food insecurity - inability: Not on file  . Transportation needs - medical: Not on file  . Transportation needs - non-medical: Not on file  Occupational History  . Not on file  Tobacco Use  . Smoking status: Former Smoker    Packs/day: 1.00    Types: Cigarettes    Last attempt to quit: 03/05/2017    Years since quitting: 0.5  . Smokeless tobacco: Never Used  Substance and Sexual Activity  . Alcohol use: No  . Drug use: Yes    Types: Heroin, Cocaine, Marijuana    Comment: Clean since Aug 2015  . Sexual activity: Yes  Other Topics Concern  . Not on file  Social History Narrative  . Not on file   No current facility-administered medications on file prior to  encounter.    Current Outpatient Medications on File Prior to Encounter  Medication Sig Dispense Refill  . acetaminophen (TYLENOL) 500 MG tablet Take 1,000 mg by mouth every 6 (six) hours as needed for moderate pain.    . diphenhydramine-acetaminophen (TYLENOL PM) 25-500 MG TABS tablet Take 1 tablet at bedtime as needed by mouth.    . hydrOXYzine (ATARAX/VISTARIL) 10 MG tablet Take 10 mg by mouth 3 (three) times daily as needed for anxiety.     . lamoTRIgine (LAMICTAL) 200 MG tablet Take 200 mg by mouth daily.    . Prenatal Vit-Fe Fumarate-FA (PRENATAL MULTIVITAMIN) TABS tablet Take 1 tablet by mouth daily at 12 noon.    Marland Kitchen amoxicillin (AMOXIL) 500 MG capsule Take 500 mg by mouth 2 (two) times daily.    . valACYclovir (VALTREX) 500 MG tablet Take 500 mg daily as needed by mouth (flare up).     Allergies  Allergen Reactions  . Ciprofloxacin Hives    ROS:  Review of Systems  Constitutional: Negative for chills, diaphoresis and fever.  Respiratory: Negative for cough, chest tightness, shortness of breath and wheezing.   Cardiovascular: Negative for chest pain and leg swelling.  Gastrointestinal: Positive for abdominal pain. Negative for abdominal distention, constipation, diarrhea, nausea and vomiting.  Genitourinary: Negative for difficulty urinating, dysuria, flank pain,  frequency, vaginal bleeding and vaginal discharge.  Musculoskeletal: Negative for back pain and neck pain.  Neurological: Negative for headaches.   I have reviewed patient's Past Medical Hx, Surgical Hx, Family Hx, Social Hx, medications and allergies.   Physical Exam   Patient Vitals for the past 24 hrs:  BP Temp Temp src Pulse Resp SpO2 Height Weight  09/23/17 1125 (!) 97/53 98.2 F (36.8 C) Oral 78 20 99 % 5\' 1"  (1.549 m) 119 lb (54 kg)   Constitutional: Well-developed, well-nourished female in no acute distress.  Cardiovascular: normal rate Respiratory: normal effort GI: Abd soft, non-tender- patient states  it is sore with pressure from pushing. Not rigid and no guarding present. Pos BS x 4 MS: Extremities nontender, no edema, normal ROM Neurologic: Alert and oriented x 4.  GU: Neg CVAT.  Cervical Exam: Dilation: Closed Effacement (%): Thick Cervical Position: Posterior Exam by:: v [name] cnm  FHT 158 by doppler  LAB RESULTS Results for orders placed or performed during the hospital encounter of 09/23/17 (from the past 24 hour(s))  Urinalysis, Routine w reflex microscopic     Status: Abnormal   Collection Time: 09/23/17 11:35 AM  Result Value Ref Range   Color, Urine YELLOW YELLOW   APPearance CLOUDY (A) CLEAR   Specific Gravity, Urine 1.021 1.005 - 1.030   pH 7.0 5.0 - 8.0   Glucose, UA NEGATIVE NEGATIVE mg/dL   Hgb urine dipstick NEGATIVE NEGATIVE   Bilirubin Urine NEGATIVE NEGATIVE   Ketones, ur NEGATIVE NEGATIVE mg/dL   Protein, ur 30 (A) NEGATIVE mg/dL   Nitrite NEGATIVE NEGATIVE   Leukocytes, UA NEGATIVE NEGATIVE   RBC / HPF 0-5 0 - 5 RBC/hpf   WBC, UA 0-5 0 - 5 WBC/hpf   Bacteria, UA RARE (A) NONE SEEN   Squamous Epithelial / LPF 6-30 (A) NONE SEEN   Mucus PRESENT   CBC with Differential     Status: None   Collection Time: 09/23/17 12:21 PM  Result Value Ref Range   WBC 9.2 4.0 - 10.5 K/uL   RBC 4.24 3.87 - 5.11 MIL/uL   Hemoglobin 13.0 12.0 - 15.0 g/dL   HCT 37.6 36.0 - 46.0 %   MCV 88.7 78.0 - 100.0 fL   MCH 30.7 26.0 - 34.0 pg   MCHC 34.6 30.0 - 36.0 g/dL   RDW 12.3 11.5 - 15.5 %   Platelets 242 150 - 400 K/uL   Neutrophils Relative % 69 %   Neutro Abs 6.3 1.7 - 7.7 K/uL   Lymphocytes Relative 24 %   Lymphs Abs 2.2 0.7 - 4.0 K/uL   Monocytes Relative 3 %   Monocytes Absolute 0.3 0.1 - 1.0 K/uL   Eosinophils Relative 4 %   Eosinophils Absolute 0.4 0.0 - 0.7 K/uL   Basophils Relative 0 %   Basophils Absolute 0.0 0.0 - 0.1 K/uL      MAU Management/MDM: Ordered labs and reviewed results of urinalysis and CBC with diff to rule out underlying causes of  abdominal pain such as appendicitis or pyelo. Possible round ligament pain affecting pregnancy.  OB urine Culture    Consulted Dr Charlesetta Garibaldi on assessment and plan.   Treatments in MAU included 400mg  Ibuprofen- Percocet ordered by Dr Charlesetta Garibaldi but patient declined due to being a recovering addict.  Pt discharged with close follow up in office.   ASSESSMENT 1. Pain of round ligament affecting pregnancy, antepartum   2. Abdominal pain during pregnancy in second trimester  PLAN Discharge home Follow up in office in 1 to 2 weeks (already has appointment scheduled for November 30th in office) and return to MAU as needed for emergencies  OTC Tylenol as needed for pain control   RX for COMFORT FIT MATERNITY SUPP SM Misc  1 Device daily by Does not apply route.  Next dose due:   Darrol Poke  Certified Nurse-Midwife 09/23/2017  12:03 PM

## 2017-09-23 NOTE — MAU Note (Signed)
Pt presents with c/o constant left lower abdominal pain that began 0530 this morning.  Denies VB.  Reports took Tylenol @ 2-3 hours ago without relief.

## 2017-09-24 LAB — CULTURE, OB URINE: Culture: <10000 — AB

## 2017-09-30 ENCOUNTER — Ambulatory Visit: Payer: BLUE CROSS/BLUE SHIELD | Admitting: Physical Therapy

## 2017-09-30 ENCOUNTER — Encounter: Payer: Self-pay | Admitting: Physical Therapy

## 2017-09-30 DIAGNOSIS — G8929 Other chronic pain: Secondary | ICD-10-CM

## 2017-09-30 DIAGNOSIS — M545 Low back pain: Principal | ICD-10-CM

## 2017-09-30 DIAGNOSIS — M62830 Muscle spasm of back: Secondary | ICD-10-CM

## 2017-09-30 NOTE — Therapy (Signed)
[place], [place], [address] Phone: [phone]   Fax:  [phone]  Physical Therapy Treatment  Patient Details  Name: Karen [name] MRN: [id number] Date of Birth: June 10, 1991 Referring Provider: Vickki Hearing, MD   Encounter Date: 09/30/2017  PT End of Session - 09/30/17 1410    Visit Number  5    Number of Visits  12    Date for PT Re-Evaluation  10/11/17    Authorization Type  BCBS    PT Start Time  1347 pt on time, checked in late    PT Stop Time  1432    PT Time Calculation (min)  45 min    Activity Tolerance  Patient tolerated treatment well    Behavior During Therapy  [facility] for tasks assessed/performed       Past Medical History:  Diagnosis Date  . Chronic UTI   . Endometriosis     Past Surgical History:  Procedure Laterality Date  . DIAGNOSTIC LAPAROSCOPY  2014  . DILATION AND EVACUATION N/A 03/11/2015   Procedure: DILATATION AND EVACUATION;  Surgeon: Waymon Amato, MD;  Location: [facility];  Service: Gynecology;  Laterality: N/A;  . TONSILLECTOMY  2015    There were no vitals filed for this visit.  Subjective Assessment - 09/30/17 1349    Subjective  The Ortho was unable to see the note you wrote me. Had round ligament pain, had to go to [facility] the other day.  they have me a brace.      Currently in Pain?  Yes    Pain Score  7     Pain Location  Back into hips     Pain Orientation  Left;Right    Pain Descriptors / Indicators  Aching;Sharp    Pain Type  Chronic pain    Pain Onset  More than a month ago    Pain Frequency  Constant    Aggravating Factors   standing, working    Pain Relieving Factors  bracce, hot bath, ice, tylenol sitting           [facility] Adult PT Treatment/Exercise - 09/30/17 0001      Lumbar Exercises: Stretches   Lower Trunk Rotation  10 seconds    Pelvic Tilt  5 reps      Lumbar Exercises: Supine   Clam  20 reps    Bent Knee Raise  20 reps      Knee/Hip  Exercises: Stretches   Other Knee/Hip Stretches  on Reformer see note       Manual Therapy   Manual Therapy  Taping    McConnell  "X" across sacrum for stability          Pilates Reformer used for LE/core strength, postural strength, lumbopelvic disassociation and core control.  Exercises included: Quadruped 1 Red UE x 10, 1 blue for LE , cues for full hip ext and support from the front  Bird dog x 5 UE and LE for core stability  Supine hip stretching 1 Red spring hamstring and ITB   Rt side tighter in both hamstring and hip flexors     PT Education - 09/30/17 1410    Education provided  Yes    Education Details  spinal mobility , Reformer , Geographical information systems officer) Educated  Patient    Methods  Explanation    Comprehension  Verbalized understanding       PT Short Term Goals - 09/16/17 1235  PT SHORT TERM GOAL #1   Title  She will be independent with initial HEP for strength and stabilization    Status  Achieved        PT Long Term Goals - 09/30/17 1440      PT LONG TERM GOAL #1   Title  She will be independent with all HEP issued     Status  On-going      PT LONG TERM GOAL #2   Title  She will report pain decreased 30% or more with sitting with support for more than 30 min    Status  On-going      PT LONG TERM GOAL #3   Title  Pt will be able to stand for work for 2 hours consecutively with min pain increase in hip pain from baseline.     Status  On-going      PT LONG TERM GOAL #4   Title  Pt will report less instances of waking from hip pain (improved 50%)     Baseline  varies, last night was really bad, awake since 3:30     Status  Partially Met            Plan - 09/30/17 1440    Clinical Impression Statement  Patient saw Dr. Delilah Shan but he was not able to see our notes, he recommended she continue PT.  Trial of tape for SIJ support.  Less pain post session.  Rt. hip tighter than Lt LE with feet in strap stretching.     PT Next Visit Plan   Progress Pilates based stabilization exercises, possible reformer next visit, manual to Rt hip and lumbar     PT Home Exercise Plan  PPT and  ball squeeze and clam blue band, anterior hip stretch , march and lateral hip (mod Piriformis)     Consulted and Agree with Plan of Care  Patient       Patient will benefit from skilled therapeutic intervention in order to improve the following deficits and impairments:  Pain, Decreased strength, Decreased activity tolerance, Decreased range of motion, Increased muscle spasms  Visit Diagnosis: Chronic right-sided low back pain, with sciatica presence unspecified  Muscle spasm of back     Problem List Patient Active Problem List   Diagnosis Date Noted  . Bipolar 1 disorder ([place]) 03/10/2015  . Chronic depression 03/10/2015  . Chronic anxiety 03/10/2015  . Recurrent UTI 03/10/2015  . Allergy history, drug--Cipro 03/10/2015  . Hx of substance abuse--staying at [facility] 03/10/2015  . Back pain 03/10/2015    Rewa Weissberg 09/30/2017, 2:50 PM  [facility] [address] [place], [place], [address] Phone: [phone]   Fax:  [phone]  Name: Karen [name] MRN: [id number] Date of Birth: 1991-11-03  Raeford Razor, PT 09/30/17 2:50 PM Phone: [phone] Fax: [phone]

## 2017-10-07 ENCOUNTER — Ambulatory Visit: Payer: BLUE CROSS/BLUE SHIELD | Attending: Sports Medicine | Admitting: Physical Therapy

## 2017-10-07 ENCOUNTER — Encounter: Payer: Self-pay | Admitting: Physical Therapy

## 2017-10-07 DIAGNOSIS — M62830 Muscle spasm of back: Secondary | ICD-10-CM | POA: Diagnosis present

## 2017-10-07 DIAGNOSIS — M545 Low back pain: Secondary | ICD-10-CM | POA: Diagnosis not present

## 2017-10-07 DIAGNOSIS — G8929 Other chronic pain: Secondary | ICD-10-CM | POA: Diagnosis present

## 2017-10-07 NOTE — Patient Instructions (Signed)
Hamstring Stretch, Reclined (Strap, Doorframe)    Lengthen bottom leg on floor. Extend top leg along edge of doorframe or press foot up into yoga strap. Hold for __3__ breaths. Repeat __3__ times each leg.  Copyright  VHI. All rights reserved.  Use blue band for resistance, move leg up and down Also try circles to release hips

## 2017-10-07 NOTE — Therapy (Signed)
[place], [place], [address] Phone: [phone]   Fax:  [phone]  Physical Therapy Treatment  Patient Details  Name: Karen [name] MRN: [id number] Date of Birth: 06/07/1991 Referring Provider: Vickki Hearing, MD   Encounter Date: 10/07/2017  PT End of Session - 10/07/17 1229    Visit Number  6    Number of Visits  12    Date for PT Re-Evaluation  10/11/17    Authorization Type  BCBS    PT Start Time  1100    PT Stop Time  1145    PT Time Calculation (min)  45 min    Activity Tolerance  Patient tolerated treatment well    Behavior During Therapy  [facility] for tasks assessed/performed       Past Medical History:  Diagnosis Date  . Chronic UTI   . Endometriosis     Past Surgical History:  Procedure Laterality Date  . DIAGNOSTIC LAPAROSCOPY  2014  . DILATION AND EVACUATION N/A 03/11/2015   Procedure: DILATATION AND EVACUATION;  Surgeon: Waymon Amato, MD;  Location: [facility];  Service: Gynecology;  Laterality: N/A;  . TONSILLECTOMY  2015    There were no vitals filed for this visit.  Subjective Assessment - 10/07/17 1100    Subjective  Pinches constantly in L side of low back and wrapping into ant. hip. Can't get comfortable at night.      Currently in Pain?  Yes    Pain Score  6     Pain Location  Back    Pain Orientation  Left;Lower    Pain Descriptors / Indicators  Pressure    Pain Type  Chronic pain    Pain Onset  More than a month ago    Pain Frequency  Constant    Aggravating Factors   lying on back, ant. pelvic tilt     Pain Relieving Factors  not much right now, repositioning, ex feels good but does not last           [facility] Adult PT Treatment/Exercise - 10/07/17 0001      Lumbar Exercises: Sidelying   Other Sidelying Lumbar Exercises  stretching to L trunk leg off table arm overhead       Lumbar Exercises: Quadruped   Other Quadruped Lumbar Exercises  childs pose for MFR and IASTM        Knee/Hip Exercises: Stretches   Active Hamstring Stretch  Both used theraband blue     ITB Stretch  Left;2 reps    Piriformis Stretch  Left;3 reps    Other Knee/Hip Stretches  active hip flex/ext for dynamic stretching, very tight on L side , also hip circles     Other Knee/Hip Stretches  flex/abd x 3 , 30 sec       Manual Therapy   Manual Therapy  Soft tissue mobilization;Myofascial release;Taping    Joint Mobilization  LAD Lt gentle     Soft tissue mobilization  L glute med, piriformis and around gr troch. with IASTM and also rectus femoris with IASTM     Myofascial Release  L QL , L trunk in sidelying     Kinesiotex  Inhibit Muscle      Kinesiotix   Inhibit Muscle   2 longitudinal strips and 1 horizontal decompression on L. L5             PT Education - 10/07/17 1229    Education provided  Yes  Education Details  anatomy , pelvic position, K tape     Person(s) Educated  Patient    Methods  Explanation    Comprehension  Verbalized understanding       PT Short Term Goals - 09/16/17 1235      PT SHORT TERM GOAL #1   Title  She will be independent with initial HEP for strength and stabilization    Status  Achieved        PT Long Term Goals - 09/30/17 1440      PT LONG TERM GOAL #1   Title  She will be independent with all HEP issued     Status  On-going      PT LONG TERM GOAL #2   Title  She will report pain decreased 30% or more with sitting with support for more than 30 min    Status  On-going      PT LONG TERM GOAL #3   Title  Pt will be able to stand for work for 2 hours consecutively with min pain increase in hip pain from baseline.     Status  On-going      PT LONG TERM GOAL #4   Title  Pt will report less instances of waking from hip pain (improved 50%)     Baseline  varies, last night was really bad, awake since 3:30     Status  Partially Met            Plan - 10/07/17 1231    Clinical Impression Statement  Able to reduce pain after  session, focused on manual today with some dynamic stretching. Pain when spine is in extension (supine).  Pelvis appeared symmetrical , L hamstring. tighter than Rt. Glute med and piriformis.      PT Next Visit Plan  Repeat manual, how was tape. Progress Pilates based stabilization exercises, possible reformer next visit, manual to Rt hip and lumbar     PT Home Exercise Plan  PPT and  ball squeeze and clam blue band, anterior hip stretch , march and lateral hip (mod Piriformis)     Consulted and Agree with Plan of Care  Patient       Patient will benefit from skilled therapeutic intervention in order to improve the following deficits and impairments:     Visit Diagnosis: Chronic right-sided low back pain, with sciatica presence unspecified  Muscle spasm of back     Problem List Patient Active Problem List   Diagnosis Date Noted  . Bipolar 1 disorder ([place]) 03/10/2015  . Chronic depression 03/10/2015  . Chronic anxiety 03/10/2015  . Recurrent UTI 03/10/2015  . Allergy history, drug--Cipro 03/10/2015  . Hx of substance abuse--staying at [facility] 03/10/2015  . Back pain 03/10/2015    PAA,JENNIFER 10/07/2017, 12:43 PM  [facility] [address] [place], [place], [address] Phone: [phone]   Fax:  [phone]  Name: Karen [name] MRN: [id number] Date of Birth: 1991-03-23   Raeford Razor, PT 10/07/17 12:48 PM Phone: [phone] Fax: [phone]

## 2017-10-10 ENCOUNTER — Encounter: Payer: Self-pay | Admitting: Physical Therapy

## 2017-10-10 ENCOUNTER — Ambulatory Visit: Payer: BLUE CROSS/BLUE SHIELD | Admitting: Physical Therapy

## 2017-10-10 DIAGNOSIS — M545 Low back pain: Secondary | ICD-10-CM | POA: Diagnosis not present

## 2017-10-10 DIAGNOSIS — G8929 Other chronic pain: Secondary | ICD-10-CM

## 2017-10-10 DIAGNOSIS — M62830 Muscle spasm of back: Secondary | ICD-10-CM

## 2017-10-10 NOTE — Addendum Note (Signed)
Addended by: Raeford Razor L on: 10/10/2017 04:36 PM   Modules accepted: Orders

## 2017-10-10 NOTE — Therapy (Signed)
[place], [place], [address] Phone: [phone]   Fax:  [phone]  Physical Therapy Treatment and Renewal  Patient Details  Name: Karen [name] MRN: [id number] Date of Birth: [birth year] Referring Provider: Vickki Hearing, MD   Encounter Date: 10/10/2017  PT End of Session - 10/10/17 1547    Visit Number  7    Number of Visits  15    Date for PT Re-Evaluation  11/07/17    PT Start Time  4854    PT Stop Time  1624    PT Time Calculation (min)  39 min    Activity Tolerance  Patient tolerated treatment well    Behavior During Therapy  [facility] for tasks assessed/performed       Past Medical History:  Diagnosis Date  . Chronic UTI   . Endometriosis     Past Surgical History:  Procedure Laterality Date  . DIAGNOSTIC LAPAROSCOPY  2014  . DILATION AND EVACUATION N/A 03/11/2015   Procedure: DILATATION AND EVACUATION;  Surgeon: Waymon Amato, MD;  Location: [facility];  Service: Gynecology;  Laterality: N/A;  . TONSILLECTOMY  2015    There were no vitals filed for this visit.  Subjective Assessment - 10/10/17 1549    Subjective  Whatever you did last time really helped.  Stood today for 7 hours and my pain is 3/10.  Sees Dr. Delilah Shan 12/18    Currently in Pain?  Yes    Pain Score  3     Pain Location  Back    Pain Descriptors / Indicators  Aching    Pain Type  Chronic pain    Pain Onset  More than a month ago         [facility] PT Assessment - 10/10/17 0001      AROM   Lumbar Flexion  WFL goes over Rt. hip     Lumbar Extension  discomfort    Lumbar - Right Side [facility] with stretch on L     Lumbar - Left Side Bend  [facility]        [facility] Adult PT Treatment/Exercise - 10/10/17 0001      Lumbar Exercises: Supine   Bridge  10 reps    Other Supine Lumbar Exercises  Stability ball exercises: pelvic tilts FW and back, lateral.  rotation, toe tape, knee lifts and alternating arm/leg lifts (eyes open and closed)    Other Supine Lumbar Exercises  red band diagonal pull and horiz pull x 10 each       Manual Therapy   Soft tissue mobilization  L glute med, piriformis and around gr troch. with IASTM and also rectus femoris with IASTM     Myofascial Release  L QL , L trunk in sidelying              PT Education - 10/10/17 1609    Education provided  Yes    Education Details  ball exercises     Person(s) Educated  Patient    Methods  Explanation    Comprehension  Verbalized understanding       PT Short Term Goals - 09/16/17 1235      PT SHORT TERM GOAL #1   Title  She will be independent with initial HEP for strength and stabilization    Status  Achieved        PT Long Term Goals - 10/10/17 1548      PT LONG TERM GOAL #1  Title  She will be independent with all HEP issued     Status  On-going      PT LONG TERM GOAL #2   Title  She will report pain decreased 30% or more with sitting with support for more than 30 min      PT LONG TERM GOAL #3   Title  Pt will be able to stand for work for 2 hours consecutively with min pain increase in hip pain from baseline.     Baseline  improving     Status  Partially Met      PT LONG TERM GOAL #4   Title  Pt will report less instances of waking from hip pain (improved 50%)     Baseline  slept great this week     Status  On-going            Plan - 10/10/17 1632    Clinical Impression Statement  Patient feeling much better since last session.  She has slept well this week.  She will benefit from more time in PT to maintain improvements.  Cont with L sided tightness in lateal hip and lumbar.      PT Frequency  2x / week    PT Duration  4 weeks    PT Treatment/Interventions  Cryotherapy;Patient/family education;Manual techniques;Taping;Therapeutic exercise;Therapeutic activities;Moist Heat    PT Next Visit Plan  Repeat manual, Progress Pilates based stabilization exercises, possible reformer next visit, manual to Rt hip and lumbar     PT  Home Exercise Plan  PPT and  ball squeeze and clam blue band, anterior hip stretch , march and lateral hip (mod Piriformis)     Consulted and Agree with Plan of Care  Patient       Patient will benefit from skilled therapeutic intervention in order to improve the following deficits and impairments:  Pain, Decreased strength, Decreased activity tolerance, Decreased range of motion, Increased muscle spasms  Visit Diagnosis: Chronic right-sided low back pain, with sciatica presence unspecified  Muscle spasm of back     Problem List Patient Active Problem List   Diagnosis Date Noted  . Bipolar 1 disorder ([place]) 03/10/2015  . Chronic depression 03/10/2015  . Chronic anxiety 03/10/2015  . Recurrent UTI 03/10/2015  . Allergy history, drug--Cipro 03/10/2015  . Hx of substance abuse--staying at [facility] 03/10/2015  . Back pain 03/10/2015    Tai Skelly 10/10/2017, 4:34 PM  [facility] [address] [place], [place], [address] Phone: [phone]   Fax:  [phone]  Name: Karen [name] MRN: [id number] Date of Birth: [birth year]  Raeford Razor, PT 10/10/17 4:34 PM Phone: [phone] Fax: [phone]

## 2017-10-22 ENCOUNTER — Ambulatory Visit: Payer: BLUE CROSS/BLUE SHIELD | Admitting: Physical Therapy

## 2017-10-22 ENCOUNTER — Encounter: Payer: Self-pay | Admitting: Physical Therapy

## 2017-10-22 DIAGNOSIS — M545 Low back pain: Principal | ICD-10-CM

## 2017-10-22 DIAGNOSIS — M62830 Muscle spasm of back: Secondary | ICD-10-CM

## 2017-10-22 DIAGNOSIS — G8929 Other chronic pain: Secondary | ICD-10-CM

## 2017-10-22 NOTE — Patient Instructions (Signed)
Bird Dog Pose - Intermediate    Keep pelvis stable. From table pose, step one leg back to plank pose. Reach opposite arm forward, back foot off floor.  Hold for __5 sec __ breaths. Return arm and leg at same rate. Repeat __10__ times, alternating sides.  Back flat, head in line with your spine  Can also do just 1 leg reaching back, hips level   Copyright  VHI. All rights reserved.

## 2017-10-22 NOTE — Therapy (Signed)
Karen [name], [place], [address] Phone: [phone]   Fax:  [phone]  Physical Therapy Treatment  Patient Details  Name: Karen [name] MRN: [id number] Date of Birth: February 16, 1991 Referring Provider: Vickki Hearing, MD   Encounter Date: 10/22/2017  PT End of Session - 10/22/17 1421    Visit Number  8    Number of Visits  15    Date for PT Re-Evaluation  11/07/17    PT Start Time  1331    PT Stop Time  1420    PT Time Calculation (min)  49 min    Activity Tolerance  Patient tolerated treatment well    Behavior During Therapy  [facility] for tasks assessed/performed       Past Medical History:  Diagnosis Date  . Chronic UTI   . Endometriosis     Past Surgical History:  Procedure Laterality Date  . DIAGNOSTIC LAPAROSCOPY  2014  . DILATION AND EVACUATION N/A 03/11/2015   Procedure: DILATATION AND EVACUATION;  Surgeon: Waymon Amato, MD;  Location: [facility];  Service: Gynecology;  Laterality: N/A;  . TONSILLECTOMY  2015    There were no vitals filed for this visit.  Subjective Assessment - 10/22/17 1331    Subjective  Sees Dr. Delilah Shan in about 3 weeks. Missed an appt due to work. Had been sleeping well but then the past 2 nights were not good.     Currently in Pain?  Yes    Pain Score  7     Pain Location  Back    Pain Orientation  Right;Lower    Pain Descriptors / Indicators  Aching;Sore    Pain Type  Chronic pain    Pain Radiating Towards  lateral hip     Pain Onset  More than a month ago    Pain Frequency  Intermittent    Aggravating Factors   standing, work    Pain Relieving Factors  reposition, massage, heat and stretching          Pilates Reformer used for LE/core strength, postural strength, lumbopelvic disassociation and core control.  Exercises included:  Footwork in seated on short box, 2 red 1 blue, cues for spine elongation.  Parallel and heels, wide in turn out.  Stretch calf unilaterally.   Seated Arms facing back 1 red extension x 10, row, x 10  Quadruped short box:  LE press out 1 Red x 10 each leg     [facility] Adult PT Treatment/Exercise - 10/22/17 0001      Lumbar Exercises: Supine   Heel Slides  10 reps used therapy ball     Other Supine Lumbar Exercises  lower trunk rotation x 10 each       Lumbar Exercises: Sidelying   Other Sidelying Lumbar Exercises  stretching to R trunk leg off table arm overhead       Lumbar Exercises: Quadruped   Madcat/Old Horse  5 reps    Straight Leg Raise  5 reps    Opposite Arm/Leg Raise  Right arm/Left leg;Left arm/Right leg;5 reps;5 seconds      Knee/Hip Exercises: Stretches   ITB Stretch  Left;3 reps    Piriformis Stretch  Left;3 reps      Manual Therapy   Soft tissue mobilization  R glute med, piriformis and around gr troch. with IASTM and also rectus femoris with IASTM     Myofascial Release  Rt. lumbar , trunk  PT Education - 10/22/17 2028    Education provided  Yes    Education Details  quadruped    Person(s) Educated  Patient    Methods  Explanation;Handout    Comprehension  Returned demonstration;Verbalized understanding       PT Short Term Goals - 09/16/17 1235      PT SHORT TERM GOAL #1   Title  She will be independent with initial HEP for strength and stabilization    Status  Achieved        PT Long Term Goals - 10/22/17 2028      PT LONG TERM GOAL #1   Title  She will be independent with all HEP issued     Status  On-going      PT LONG TERM GOAL #2   Title  She will report pain decreased 30% or more with sitting with support for more than 30 min    Baseline  improved 30-50%     Status  Partially Met      PT LONG TERM GOAL #3   Title  Pt will be able to stand for work for 2 hours consecutively with min pain increase in hip pain from baseline.     Baseline  improving     Status  Partially Met      PT LONG TERM GOAL #4   Title  Pt will report less instances of waking from hip  pain (improved 50%)     Baseline  continued to sleep great until about 2 nights ago hip pain returns    Status  On-going            Plan - 10/22/17 2029    Clinical Impression Statement  Pt has had almost 2 weeks between appts and has been doing well for about 10 days post last session.  Pain intermittent, interrupted sleep the past 2 nights.  She uses pillows to support.  She had increased tightness in glute med and piriformis today, pain less when she left.  Declined heat.     PT Next Visit Plan  Repeat manual, Progress Pilates based stabilization exercises, possible reformer next visit, manual to Rt hip and lumbar     PT Home Exercise Plan  PPT and  ball squeeze and clam blue band, anterior hip stretch , march and lateral hip (mod Piriformis) , quadruped     Consulted and Agree with Plan of Care  Patient       Patient will benefit from skilled therapeutic intervention in order to improve the following deficits and impairments:  Pain, Decreased strength, Decreased activity tolerance, Decreased range of motion, Increased muscle spasms  Visit Diagnosis: Chronic right-sided low back pain, with sciatica presence unspecified  Muscle spasm of back     Problem List Patient Active Problem List   Diagnosis Date Noted  . Bipolar 1 disorder ([place]) 03/10/2015  . Chronic depression 03/10/2015  . Chronic anxiety 03/10/2015  . Recurrent UTI 03/10/2015  . Allergy history, drug--Cipro 03/10/2015  . Hx of substance abuse--staying at [facility] 03/10/2015  . Back pain 03/10/2015    Karen [name] 10/22/2017, 8:34 PM  [facility] [address] [place], [place], [address] Phone: [phone]   Fax:  [phone]  Name: Karen [name] MRN: [id number] Date of Birth: 06/06/1991   Raeford Razor, PT 10/22/17 8:35 PM Phone: [phone] Fax: [phone]

## 2017-10-24 ENCOUNTER — Ambulatory Visit: Payer: BLUE CROSS/BLUE SHIELD | Admitting: Physical Therapy

## 2017-10-24 DIAGNOSIS — G8929 Other chronic pain: Secondary | ICD-10-CM

## 2017-10-24 DIAGNOSIS — M62830 Muscle spasm of back: Secondary | ICD-10-CM

## 2017-10-24 DIAGNOSIS — M545 Low back pain: Principal | ICD-10-CM

## 2017-10-24 NOTE — Therapy (Signed)
[place], [place], [address] Phone: [phone]   Fax:  [phone]  Physical Therapy Treatment  Patient Details  Name: Karen [name] MRN: [id number] Date of Birth: 10-01-1991 Referring Provider: Vickki Hearing, MD   Encounter Date: 10/24/2017  PT End of Session - 10/24/17 1526    Visit Number  9    Number of Visits  15    Date for PT Re-Evaluation  11/07/17    Authorization Type  [employer]    PT Start Time  1520    PT Stop Time  1608    PT Time Calculation (min)  48 min    Activity Tolerance  Patient tolerated treatment well    Behavior During Therapy  [facility] for tasks assessed/performed       Past Medical History:  Diagnosis Date  . Chronic UTI   . Endometriosis     Past Surgical History:  Procedure Laterality Date  . DIAGNOSTIC LAPAROSCOPY  2014  . DILATION AND EVACUATION N/A 03/11/2015   Procedure: DILATATION AND EVACUATION;  Surgeon: Waymon Amato, MD;  Location: [facility];  Service: Gynecology;  Laterality: N/A;  . TONSILLECTOMY  2015    There were no vitals filed for this visit.  Subjective Assessment - 10/24/17 1524    Subjective  was a 2/10 this am.  Has not sat down since 8:30 this AM.  Plans to do prenatal yoga class on New Yr Day.     Currently in Pain?  Yes    Pain Score  8     Pain Location  Back    Pain Orientation  Right;Lower    Pain Radiating Towards  lateral hip                       [facility] Adult PT Treatment/Exercise - 10/24/17 0001      Self-Care   Heat/Ice Application  heat vs ice, heat will not harm if applied correctly    Other Self-Care Comments   Yoga, QL stretch       Lumbar Exercises: Stretches   Piriformis Stretch  3 reps;30 seconds      Lumbar Exercises: Quadruped   Madcat/Old Horse  10 reps    Other Quadruped Lumbar Exercises  childs pose forward and lateral each side x 3 , 30 sec     Other Quadruped Lumbar Exercises  flex/ext, sidebending ("wags")        Knee/Hip Exercises: Aerobic   Stationary Bike  6 min , L2       Modalities   Modalities  Moist Heat      Moist Heat Therapy   Number Minutes Moist Heat  8 Minutes    Moist Heat Location  Lumbar Spine;Hip      Manual Therapy   Soft tissue mobilization  Rt. quadratus lumborum, Rt glute med, lateral hip     Myofascial Release  Rt. trunk /hip              PT Education - 10/24/17 1526    Education provided  Yes    Education Details  Free Prenatal yoga class     Person(s) Educated  Patient    Methods  Explanation;Handout    Comprehension  Verbalized understanding       PT Short Term Goals - 09/16/17 1235      PT SHORT TERM GOAL #1   Title  She will be independent with initial HEP for strength and stabilization  Status  Achieved        PT Long Term Goals - 10/22/17 2028      PT LONG TERM GOAL #1   Title  She will be independent with all HEP issued     Status  On-going      PT LONG TERM GOAL #2   Title  She will report pain decreased 30% or more with sitting with support for more than 30 min    Baseline  improved 30-50%     Status  Partially Met      PT LONG TERM GOAL #3   Title  Pt will be able to stand for work for 2 hours consecutively with min pain increase in hip pain from baseline.     Baseline  improving     Status  Partially Met      PT LONG TERM GOAL #4   Title  Pt will report less instances of waking from hip pain (improved 50%)     Baseline  continued to sleep great until about 2 nights ago hip pain returns    Status  On-going          FOTO next visit   Plan - 10/24/17 1559    Clinical Impression Statement  Pt with 8/10 pain in Rt. back up into ribs.  She was happy fro info on Prenatal yoga class.  Rt. rib attachment of QL muscle very tender and painful.  Able to report pain ,3/10 to 4/10 after treatment.     PT Next Visit Plan  check goals, repeat manual ,cont stabilization, consider tape FOTO   PT Home Exercise Plan  PPT and  ball  squeeze and clam blue band, anterior hip stretch , march and lateral hip (mod Piriformis) , quadruped     Consulted and Agree with Plan of Care  Patient       Patient will benefit from skilled therapeutic intervention in order to improve the following deficits and impairments:  Pain, Decreased strength, Decreased activity tolerance, Decreased range of motion, Increased muscle spasms  Visit Diagnosis: Chronic right-sided low back pain, with sciatica presence unspecified  Muscle spasm of back     Problem List Patient Active Problem List   Diagnosis Date Noted  . Bipolar 1 disorder ([place]) 03/10/2015  . Chronic depression 03/10/2015  . Chronic anxiety 03/10/2015  . Recurrent UTI 03/10/2015  . Allergy history, drug--Cipro 03/10/2015  . Hx of substance abuse--staying at [facility] 03/10/2015  . Back pain 03/10/2015    Karen [name] 10/24/2017, 4:11 PM  [facility] [address] [place], [place], [address] Phone: [phone]   Fax:  [phone]  Name: Karen [name] MRN: [id number] Date of Birth: December 05, 1990  Raeford Razor, PT 10/24/17 4:12 PM Phone: [phone] Fax: [phone]

## 2017-10-24 NOTE — Patient Instructions (Signed)
Issued sidelying stretch for Rt. Trunk, hip from cabinet

## 2017-11-08 ENCOUNTER — Encounter: Payer: Self-pay | Admitting: Physical Therapy

## 2017-11-08 ENCOUNTER — Ambulatory Visit: Payer: BLUE CROSS/BLUE SHIELD | Attending: Sports Medicine | Admitting: Physical Therapy

## 2017-11-08 DIAGNOSIS — G8929 Other chronic pain: Secondary | ICD-10-CM | POA: Diagnosis present

## 2017-11-08 DIAGNOSIS — M545 Low back pain: Secondary | ICD-10-CM | POA: Insufficient documentation

## 2017-11-08 DIAGNOSIS — M62830 Muscle spasm of back: Secondary | ICD-10-CM | POA: Insufficient documentation

## 2017-11-08 NOTE — Therapy (Signed)
[place], [place], [address] Phone: [phone]   Fax:  [phone]  Physical Therapy Treatment  Patient Details  Name: Karen [name] MRN: [id number] Date of Birth: 12/24/1990 Referring Provider: Vickki Hearing, MD   Encounter Date: 11/08/2017  PT End of Session - 11/08/17 1105    Visit Number  10    Number of Visits  15    Date for PT Re-Evaluation  12/06/17    PT Start Time  1104    PT Stop Time  1150    PT Time Calculation (min)  46 min    Activity Tolerance  Patient tolerated treatment well;Patient limited by pain    Behavior During Therapy  [facility] for tasks assessed/performed       Past Medical History:  Diagnosis Date  . Chronic UTI   . Endometriosis     Past Surgical History:  Procedure Laterality Date  . DIAGNOSTIC LAPAROSCOPY  2014  . DILATION AND EVACUATION N/A 03/11/2015   Procedure: DILATATION AND EVACUATION;  Surgeon: Waymon Amato, MD;  Location: [facility];  Service: Gynecology;  Laterality: N/A;  . TONSILLECTOMY  2015    There were no vitals filed for this visit.  Subjective Assessment - 11/08/17 1104    Subjective  Hasnt been here in a couple weeks.  Went to Prenatal yoga.  Will start going Tuesdays .  Last night pain was 9/10.  I just cried.     Pertinent History  25 weeks     Currently in Pain?  Yes    Pain Score  7     Pain Location  Back    Pain Orientation  Right;Lower    Pain Descriptors / Indicators  Aching;Tiring    Pain Onset  More than a month ago    Pain Frequency  Intermittent    Aggravating Factors   standing, work, PM, sleep    Pain Relieving Factors  pillows, heat, ice, massage and stretching         [facility] Adult PT Treatment/Exercise - 11/08/17 0001      Lumbar Exercises: Quadruped   Madcat/Old Horse  10 reps    Other Quadruped Lumbar Exercises  childs pose forward and lateral each side x 3 , 30 sec     Other Quadruped Lumbar Exercises  sidelying Rt. trunk  stretching       Moist Heat Therapy   Number Minutes Moist Heat  10 Minutes    Moist Heat Location  Lumbar Spine;Hip      Manual Therapy   Manual therapy comments  IASTM to thoracolumabr fascia in childs pose     Soft tissue mobilization  Rt. quadratus lumborum, Rt glute med, lateral hip     Myofascial Release  Rt. trunk /hip              PT Education - 11/08/17 1143    Education provided  Yes    Education Details  tips for nighttime pain, repositioning     Person(s) Educated  Patient    Methods  Explanation    Comprehension  Verbalized understanding       PT Short Term Goals - 11/08/17 1106      PT SHORT TERM GOAL #1   Title  She will be independent with initial HEP for strength and stabilization    Status  Achieved        PT Long Term Goals - 11/08/17 1106      PT LONG TERM  GOAL #1   Title  She will be independent with all HEP issued     Status  On-going      PT LONG TERM GOAL #2   Title  She will report pain decreased 30% or more with sitting with support for more than 30 min    Baseline  was better, now back to severe at times     Status  Partially Met      PT LONG TERM GOAL #3   Title  Pt will be able to stand for work for 2 hours consecutively with min pain increase in hip pain from baseline.     Baseline  improving has been more difficult lately     Status  Partially Met      PT LONG TERM GOAL #4   Title  Pt will report less instances of waking from hip pain (improved 50%)     Baseline  pain severe last night, varies .  Up since 3 am     Status  On-going            Plan - 11/08/17 1111    Clinical Impression Statement  Patient has had an increase in pain, unsure if It is because she has not been in PT.  Sees MD Monday.  Would like to finish POC. Advised her to try her maternity belt, ice/heat.     PT Frequency  2x / week 5 more visits     PT Duration  3 weeks    PT Treatment/Interventions  Cryotherapy;Patient/family education;Manual  techniques;Taping;Therapeutic exercise;Therapeutic activities;Moist Heat    PT Next Visit Plan  Quadruped stretching, gentle core ,tape vs belt     PT Home Exercise Plan  PPT and  ball squeeze and clam blue band, anterior hip stretch , march and lateral hip (mod Piriformis) , quadruped     Consulted and Agree with Plan of Care  Patient       Patient will benefit from skilled therapeutic intervention in order to improve the following deficits and impairments:  Pain, Decreased strength, Decreased activity tolerance, Decreased range of motion, Increased muscle spasms  Visit Diagnosis: Chronic right-sided low back pain, with sciatica presence unspecified  Muscle spasm of back     Problem List Patient Active Problem List   Diagnosis Date Noted  . Bipolar 1 disorder ([place]) 03/10/2015  . Chronic depression 03/10/2015  . Chronic anxiety 03/10/2015  . Recurrent UTI 03/10/2015  . Allergy history, drug--Cipro 03/10/2015  . Hx of substance abuse--staying at [facility] 03/10/2015  . Back pain 03/10/2015    PAA,JENNIFER 11/08/2017, 11:47 AM  [facility] [address] [place], [place], [address] Phone: [phone]   Fax:  [phone]  Name: Karen [name] MRN: [id number] Date of Birth: [birth year]   Raeford Razor, PT 11/08/17 11:56 AM Phone: [phone] Fax: [phone]

## 2017-11-08 NOTE — Addendum Note (Signed)
Addended by: Raeford Razor L on: 11/08/2017 12:23 PM   Modules accepted: Orders

## 2017-11-11 ENCOUNTER — Ambulatory Visit: Payer: BLUE CROSS/BLUE SHIELD | Admitting: Physical Therapy

## 2017-11-11 DIAGNOSIS — G8929 Other chronic pain: Secondary | ICD-10-CM

## 2017-11-11 DIAGNOSIS — M545 Low back pain: Secondary | ICD-10-CM | POA: Diagnosis not present

## 2017-11-11 DIAGNOSIS — M62830 Muscle spasm of back: Secondary | ICD-10-CM

## 2017-11-11 NOTE — Therapy (Signed)
[place], [place], [address] Phone: [phone]   Fax:  [phone]  Physical Therapy Treatment  Patient Details  Name: Karen [name] MRN: [id number] Date of Birth: [birth year] Referring Provider: Vickki Hearing, MD   Encounter Date: 11/11/2017  PT End of Session - 11/11/17 1356    Visit Number  11    Number of Visits  15    Date for PT Re-Evaluation  12/06/17    Authorization Type  BCBS    PT Start Time  1332    PT Stop Time  1411    PT Time Calculation (min)  39 min    Activity Tolerance  Patient tolerated treatment well    Behavior During Therapy  [facility] for tasks assessed/performed       Past Medical History:  Diagnosis Date  . Chronic UTI   . Endometriosis     Past Surgical History:  Procedure Laterality Date  . DIAGNOSTIC LAPAROSCOPY  2014  . DILATION AND EVACUATION N/A 03/11/2015   Procedure: DILATATION AND EVACUATION;  Surgeon: Waymon Amato, MD;  Location: [facility];  Service: Gynecology;  Laterality: N/A;  . TONSILLECTOMY  2015    There were no vitals filed for this visit.  Subjective Assessment - 11/11/17 1338    Subjective  Has been off today, pain is better.  Belt helped her.      Currently in Pain?  Yes    Pain Score  5     Pain Location  Back          [facility] Adult PT Treatment/Exercise - 11/11/17 0001      Lumbar Exercises: Standing   Wall Slides  15 reps    Other Standing Lumbar Exercises  single arm lat pull down blue band x 10     Other Standing Lumbar Exercises  squat hold (work towards 1 min) ableto do 20 sec x 3 low deep squat       Lumbar Exercises: Supine   Clam  10 reps bilateral and unilateral     Heel Slides  10 reps      Lumbar Exercises: Sidelying   Clam  20 reps    Hip Abduction  20 reps      Knee/Hip Exercises: Stretches   Piriformis Stretch  3 reps seated     Gastroc Stretch  3 reps slant board       Knee/Hip Exercises: Supine   Hip Adduction Isometric   Strengthening;Both;1 set;10 reps               PT Short Term Goals - 11/11/17 1357      PT SHORT TERM GOAL #1   Title  She will be independent with initial HEP for strength and stabilization    Status  Achieved        PT Long Term Goals - 11/08/17 1106      PT LONG TERM GOAL #1   Title  She will be independent with all HEP issued     Status  On-going      PT LONG TERM GOAL #2   Title  She will report pain decreased 30% or more with sitting with support for more than 30 min    Baseline  was better, now back to severe at times     Status  Partially Met      PT LONG TERM GOAL #3   Title  Pt will be able to stand for work for 2 hours  consecutively with min pain increase in hip pain from baseline.     Baseline  improving has been more difficult lately     Status  Partially Met      PT LONG TERM GOAL #4   Title  Pt will report less instances of waking from hip pain (improved 50%)     Baseline  pain severe last night, varies .  Up since 3 am     Status  On-going            Plan - 11/11/17 1358    Clinical Impression Statement  Saw MD today. Will follow up with him if needed if pain persists post delivery. Patient was able to exercise for the majority of the session.  Worked on squat hold for up to 20 sec at a time.     PT Next Visit Plan  Quadruped stretching, gentle core ,tape vs belt     PT Home Exercise Plan  PPT and  ball squeeze and clam blue band, anterior hip stretch , march and lateral hip (mod Piriformis) , quadruped     Consulted and Agree with Plan of Care  Patient       Patient will benefit from skilled therapeutic intervention in order to improve the following deficits and impairments:  Pain, Decreased strength, Decreased activity tolerance, Decreased range of motion, Increased muscle spasms  Visit Diagnosis: Chronic right-sided low back pain, with sciatica presence unspecified  Muscle spasm of back     Problem List Patient Active Problem List    Diagnosis Date Noted  . Bipolar 1 disorder ([place]) 03/10/2015  . Chronic depression 03/10/2015  . Chronic anxiety 03/10/2015  . Recurrent UTI 03/10/2015  . Allergy history, drug--Cipro 03/10/2015  . Hx of substance abuse--staying at [facility] 03/10/2015  . Back pain 03/10/2015    Kynan Peasley 11/11/2017, 2:13 PM  [facility] [address] [place], [place], [address] Phone: [phone]   Fax:  [phone]  Name: Karen [name] MRN: [id number] Date of Birth: 02-Dec-1990  Raeford Razor, PT 11/11/17 2:13 PM Phone: [phone] Fax: [phone]

## 2017-11-15 ENCOUNTER — Encounter: Payer: Self-pay | Admitting: Physical Therapy

## 2017-11-15 ENCOUNTER — Ambulatory Visit: Payer: BLUE CROSS/BLUE SHIELD | Admitting: Physical Therapy

## 2017-11-15 DIAGNOSIS — M545 Low back pain: Secondary | ICD-10-CM | POA: Diagnosis not present

## 2017-11-15 DIAGNOSIS — M62830 Muscle spasm of back: Secondary | ICD-10-CM

## 2017-11-15 DIAGNOSIS — G8929 Other chronic pain: Secondary | ICD-10-CM

## 2017-11-15 NOTE — Therapy (Signed)
[place], [place], [address] Phone: [phone]   Fax:  [phone]  Physical Therapy Treatment  Patient Details  Name: Karen [name] MRN: [id number] Date of Birth: 16-Jan-1991 Referring Provider: Vickki Hearing, MD   Encounter Date: 11/15/2017  PT End of Session - 11/15/17 1144    Visit Number  12    Number of Visits  15    Date for PT Re-Evaluation  12/06/17    Authorization Type  BCBS    PT Start Time  1105    PT Stop Time  1150    PT Time Calculation (min)  45 min    Activity Tolerance  Patient tolerated treatment well    Behavior During Therapy  [facility] for tasks assessed/performed       Past Medical History:  Diagnosis Date  . Chronic UTI   . Endometriosis     Past Surgical History:  Procedure Laterality Date  . DIAGNOSTIC LAPAROSCOPY  2014  . DILATION AND EVACUATION N/A 03/11/2015   Procedure: DILATATION AND EVACUATION;  Surgeon: Waymon Amato, MD;  Location: [facility];  Service: Gynecology;  Laterality: N/A;  . TONSILLECTOMY  2015    There were no vitals filed for this visit.  Subjective Assessment - 11/15/17 1109    Subjective  Has been unable to release her Rt. middle back.  Went to yoga Tues.     Currently in Pain?  Yes    Pain Score  6     Pain Location  Back    Pain Orientation  Right    Pain Type  Chronic pain    Pain Onset  More than a month ago    Pain Frequency  Intermittent    Aggravating Factors   sleeping, positioning     Pain Relieving Factors  pillows, heat, massage                      [facility] Adult PT Treatment/Exercise - 11/15/17 0001      Self-Care   Other Self-Care Comments   lumbar brace, options for stretching at work       Lumbar Exercises: Stretches   Hip Flexor Stretch  3 reps    Pelvic Tilt  10 reps    Piriformis Stretch  2 reps      Lumbar Exercises: Sidelying   Other Sidelying Lumbar Exercises  sidelying trunk stretch over bolster       Lumbar Exercises: Quadruped   Madcat/Old Horse  5 reps    Opposite Arm/Leg Raise  Right arm/Left leg;Left arm/Right leg;10 reps    Other Quadruped Lumbar Exercises  childs pose forward and lateral each side x 3 , 30 sec       Knee/Hip Exercises: Aerobic   Stationary Bike  5 min L1 for conditioning       Moist Heat Therapy   Number Minutes Moist Heat  8 Minutes    Moist Heat Location  Lumbar Spine      Manual Therapy   Soft tissue mobilization  Rt. quadratus lumborum, Rt glute med, lateral hip     Myofascial Release  Rt. trunk /hip              PT Education - 11/15/17 1144    Education provided  Yes    Education Details  stretching     Person(s) Educated  Patient    Methods  Explanation    Comprehension  Verbalized understanding;Returned demonstration  PT Short Term Goals - 11/11/17 1357      PT SHORT TERM GOAL #1   Title  She will be independent with initial HEP for strength and stabilization    Status  Achieved        PT Long Term Goals - 11/15/17 1145      PT LONG TERM GOAL #1   Title  She will be independent with all HEP issued     Status  On-going      PT LONG TERM GOAL #2   Title  She will report pain decreased 30% or more with sitting with support for more than 30 min    Baseline  was better, now back to severe at times     Status  Partially Met      PT LONG TERM GOAL #3   Title  Pt will be able to stand for work for 2 hours consecutively with min pain increase in hip pain from baseline.     Baseline  improving has been more difficult lately     Status  Partially Met      PT LONG TERM GOAL #4   Title  Pt will report less instances of waking from hip pain (improved 50%)     Status  On-going            Plan - 11/15/17 1145    Clinical Impression Statement  Pt with Rt. sided pain in middle nad low back today.  Options given for self care, stretching , using tennis ball and positioning.  Rt. QL spasm and very painful , improved post session  with [facility] .Marland Kitchen Plans to get a therapy ball for home and use prior to delivery. Recommend she wear the pregnancy brace for work     PT Next Visit Plan  Quadruped stretching, gentle core manual, check HEP from inital HEP     PT Home Exercise Plan  PPT and  ball squeeze and clam blue band, anterior hip stretch , march and lateral hip (mod Piriformis) , quadruped     Consulted and Agree with Plan of Care  Patient       Patient will benefit from skilled therapeutic intervention in order to improve the following deficits and impairments:  Pain, Decreased strength, Decreased activity tolerance, Decreased range of motion, Increased muscle spasms  Visit Diagnosis: Chronic right-sided low back pain, with sciatica presence unspecified  Muscle spasm of back     Problem List Patient Active Problem List   Diagnosis Date Noted  . Bipolar 1 disorder ([place]) 03/10/2015  . Chronic depression 03/10/2015  . Chronic anxiety 03/10/2015  . Recurrent UTI 03/10/2015  . Allergy history, drug--Cipro 03/10/2015  . Hx of substance abuse--staying at [facility] 03/10/2015  . Back pain 03/10/2015    Laurelyn Terrero 11/15/2017, 11:48 AM  [facility] [address] [place], [place], [address] Phone: [phone]   Fax:  [phone]  Name: Karen [name] MRN: [id number] Date of Birth: 07/04/1991  Raeford Razor, PT 11/15/17 11:48 AM Phone: [phone] Fax: [phone]

## 2017-11-18 ENCOUNTER — Encounter: Payer: Self-pay | Admitting: Physical Therapy

## 2017-11-18 ENCOUNTER — Ambulatory Visit: Payer: BLUE CROSS/BLUE SHIELD | Admitting: Physical Therapy

## 2017-11-18 DIAGNOSIS — M62830 Muscle spasm of back: Secondary | ICD-10-CM

## 2017-11-18 DIAGNOSIS — G8929 Other chronic pain: Secondary | ICD-10-CM

## 2017-11-18 DIAGNOSIS — M545 Low back pain: Principal | ICD-10-CM

## 2017-11-18 NOTE — Therapy (Signed)
[place], [place], [address] Phone: [phone]   Fax:  [phone]  Physical Therapy Treatment  Patient Details  Name: Karen [name] MRN: [id number] Date of Birth: Jun 11, 1991 Referring Provider: Vickki Hearing, MD   Encounter Date: 11/18/2017  PT End of Session - 11/18/17 1105    Visit Number  13    Number of Visits  15    Date for PT Re-Evaluation  12/06/17    Authorization Type  BCBS    PT Start Time  1102    PT Stop Time  1146    PT Time Calculation (min)  44 min    Activity Tolerance  Patient tolerated treatment well    Behavior During Therapy  [facility] for tasks assessed/performed       Past Medical History:  Diagnosis Date  . Chronic UTI   . Endometriosis     Past Surgical History:  Procedure Laterality Date  . DIAGNOSTIC LAPAROSCOPY  2014  . DILATION AND EVACUATION N/A 03/11/2015   Procedure: DILATATION AND EVACUATION;  Surgeon: Waymon Amato, MD;  Location: [facility];  Service: Gynecology;  Laterality: N/A;  . TONSILLECTOMY  2015    There were no vitals filed for this visit.  Subjective Assessment - 11/18/17 1110    Subjective  MOved in this weekend , stood to unpack. Rt. side 8/10.     Currently in Pain?  Yes    Pain Score  8     Pain Location  Hip    Pain Orientation  Right    Pain Descriptors / Indicators  Aching;Sore    Pain Type  Chronic pain    Pain Onset  More than a month ago    Pain Frequency  Intermittent    Aggravating Factors   standing, sleeping on R, overactivity     Pain Relieving Factors  Tylenol PM, heat, massage stretching         [facility] Adult PT Treatment/Exercise - 11/18/17 0001      Lumbar Exercises: Quadruped   Madcat/Old Horse  5 reps    Opposite Arm/Leg Raise  Right arm/Left leg;Left arm/Right leg;10 reps      Knee/Hip Exercises: Stretches   Active Hamstring Stretch  2 reps    ITB Stretch  2 reps    Piriformis Stretch  Both;2 reps;30 seconds      Knee/Hip  Exercises: Aerobic   Stationary Bike  5 min L1 for conditioning       Manual Therapy   Manual therapy comments  IASTM along R lumbar paraspinals and ITB     Soft tissue mobilization  Rt. quadratus lumborum, Rt glute med, lateral hip     Myofascial Release  Rt. trunk /hip              PT Education - 11/18/17 1115    Education provided  Yes    Education Details  HEP guidance     Person(s) Educated  Patient    Methods  Explanation    Comprehension  Verbalized understanding       PT Short Term Goals - 11/11/17 1357      PT SHORT TERM GOAL #1   Title  She will be independent with initial HEP for strength and stabilization    Status  Achieved        PT Long Term Goals - 11/15/17 1145      PT LONG TERM GOAL #1   Title  She will be independent with  all HEP issued     Status  On-going      PT LONG TERM GOAL #2   Title  She will report pain decreased 30% or more with sitting with support for more than 30 min    Baseline  was better, now back to severe at times     Status  Partially Met      PT LONG TERM GOAL #3   Title  Pt will be able to stand for work for 2 hours consecutively with min pain increase in hip pain from baseline.     Baseline  improving has been more difficult lately     Status  Partially Met      PT LONG TERM GOAL #4   Title  Pt will report less instances of waking from hip pain (improved 50%)     Status  On-going            Plan - 11/18/17 1321    Clinical Impression Statement  Pt with intense pain last night and today which she attributes to prolonged standing, bending, squatting as she unpacked her new home.  She was I with HEP today.  She will be ready for DC and knows she "just needs to do  her stuff at home".     PT Next Visit Plan  Quadruped stretching, gentle core manual, check HEP from inital HEP     PT Home Exercise Plan  PPT and  ball squeeze and clam blue band, anterior hip stretch , march and lateral hip (mod Piriformis) , quadruped      Consulted and Agree with Plan of Care  Patient       Patient will benefit from skilled therapeutic intervention in order to improve the following deficits and impairments:  Pain, Decreased strength, Decreased activity tolerance, Decreased range of motion, Increased muscle spasms  Visit Diagnosis: Chronic right-sided low back pain, with sciatica presence unspecified  Muscle spasm of back     Problem List Patient Active Problem List   Diagnosis Date Noted  . Bipolar 1 disorder ([place]) 03/10/2015  . Chronic depression 03/10/2015  . Chronic anxiety 03/10/2015  . Recurrent UTI 03/10/2015  . Allergy history, drug--Cipro 03/10/2015  . Hx of substance abuse--staying at [facility] 03/10/2015  . Back pain 03/10/2015    Claudie Brickhouse 11/18/2017, 1:29 PM  [facility] [address] [place], [place], [address] Phone: [phone]   Fax:  [phone]  Name: Karen [name] MRN: [id number] Date of Birth: [birth year]  Raeford Razor, PT 11/18/17 1:29 PM Phone: [phone] Fax: [phone]

## 2017-11-22 ENCOUNTER — Encounter: Payer: Self-pay | Admitting: Physical Therapy

## 2017-11-22 ENCOUNTER — Ambulatory Visit: Payer: BLUE CROSS/BLUE SHIELD | Admitting: Physical Therapy

## 2017-11-22 DIAGNOSIS — M545 Low back pain: Principal | ICD-10-CM

## 2017-11-22 DIAGNOSIS — G8929 Other chronic pain: Secondary | ICD-10-CM

## 2017-11-22 DIAGNOSIS — M62830 Muscle spasm of back: Secondary | ICD-10-CM

## 2017-11-22 NOTE — Therapy (Addendum)
[place], [place], [address] Phone: [phone]   Fax:  [phone]  Physical Therapy Treatment/Discharge  Patient Details  Name: Karen [name] MRN: [id number] Date of Birth: [birth year] Referring Provider: Vickki Hearing, MD   Encounter Date: 11/22/2017  PT End of Session - 11/22/17 0929    Visit Number  14    Authorization Type  BCBS    PT Start Time  0845    PT Stop Time  0938    PT Time Calculation (min)  53 min    Activity Tolerance  Patient tolerated treatment well    Behavior During Therapy  [facility] for tasks assessed/performed       Past Medical History:  Diagnosis Date  . Chronic UTI   . Endometriosis     Past Surgical History:  Procedure Laterality Date  . DIAGNOSTIC LAPAROSCOPY  2014  . DILATION AND EVACUATION N/A 03/11/2015   Procedure: DILATATION AND EVACUATION;  Surgeon: Waymon Amato, MD;  Location: [facility];  Service: Gynecology;  Laterality: N/A;  . TONSILLECTOMY  2015    There were no vitals filed for this visit.  Subjective Assessment - 11/22/17 0846    Subjective  Its early in the day so its a 4/10.  New mattress has helped.  its my last day.     Currently in Pain?  Yes    Pain Score  4          [facility] PT Assessment - 11/22/17 0001      AROM   Lumbar Flexion  WFL    Lumbar Extension  WFL pulling anteriorly    Lumbar - Right Side Bend  [facility]    Lumbar - Left Side Bend  [facility]         [facility] Adult PT Treatment/Exercise - 11/22/17 0001      Lumbar Exercises: Stretches   Pelvic Tilt  10 reps    Piriformis Stretch  Right;2 reps      Lumbar Exercises: Supine   Ab Set  10 reps    Clam  20 reps blue band     Bent Knee Raise  10 reps      Knee/Hip Exercises: Stretches   Active Hamstring Stretch  2 reps      Moist Heat Therapy   Number Minutes Moist Heat  10 Minutes    Moist Heat Location  Lumbar Spine      Manual Therapy   Manual therapy comments  IASTM along R lumbar  paraspinals and ITB     Soft tissue mobilization  Rt. quadratus lumborum, Rt glute med, lateral hip     Myofascial Release  Rt. trunk /hip              PT Education - 11/22/17 0929    Education provided  No       PT Short Term Goals - 11/22/17 0859      PT SHORT TERM GOAL #1   Title  She will be independent with initial HEP for strength and stabilization    Status  Achieved        PT Long Term Goals - 11/22/17 0859      PT LONG TERM GOAL #1   Title  She will be independent with all HEP issued     Status  Achieved      PT LONG TERM GOAL #2   Title  She will report pain decreased 30% or more with sitting with support for more  than 30 min    Status  Achieved      PT LONG TERM GOAL #3   Title  Pt will be able to stand for work for 2 hours consecutively with min pain increase in hip pain from baseline.     Baseline  met, relatively better considering pregnancy status    Status  Achieved      PT LONG TERM GOAL #4   Title  Pt will report less instances of waking from hip pain (improved 50%)     Baseline  not necessarily due to hip pain , improved but not 50%    Status  Partially Met            Plan - 11/22/17 0924    Clinical Impression Statement  Patient is ready for discharge.  She knows how to reduce her hip and back pain when she needs to.  The natural progression of pregnancy is causing her general discomfort especially with sleeping and working.  She will continue HEP and weekly yoga class, may return if needed post partum if symptoms worsen.      PT Next Visit Plan  NA     PT Home Exercise Plan  PPT and  ball squeeze and clam blue band, anterior hip stretch , march and lateral hip (mod Piriformis) , quadruped     Consulted and Agree with Plan of Care  Patient       Patient will benefit from skilled therapeutic intervention in order to improve the following deficits and impairments:  Pain, Decreased strength, Decreased activity tolerance, Decreased range of  motion, Increased muscle spasms  Visit Diagnosis: Chronic right-sided low back pain, with sciatica presence unspecified  Muscle spasm of back     Problem List Patient Active Problem List   Diagnosis Date Noted  . Bipolar 1 disorder ([place]) 03/10/2015  . Chronic depression 03/10/2015  . Chronic anxiety 03/10/2015  . Recurrent UTI 03/10/2015  . Allergy history, drug--Cipro 03/10/2015  . Hx of substance abuse--staying at [facility] 03/10/2015  . Back pain 03/10/2015    Jeromy Borcherding 11/22/2017, 9:30 AM  [facility] [address] [place], [place], [address] Phone: [phone]   Fax:  [phone]  Name: Karen [name] MRN: [id number] Date of Birth: 1991-09-23  PHYSICAL THERAPY DISCHARGE SUMMARY  Visits from Start of Care: 14  Current functional level related to goals / functional outcomes: See above    Remaining deficits: Hip and back discomfort   Education / Equipment: HEP, posture and self care, prenatal care   Plan: Patient agrees to discharge.  Patient goals were partially met. Patient is being discharged due to being pleased with the current functional level.  ?????    Raeford Razor, PT 11/22/17 9:31 AM Phone: [phone] Fax: [phone]

## 2018-01-27 LAB — OB RESULTS CONSOLE GBS: STREP GROUP B AG: NEGATIVE

## 2018-01-28 ENCOUNTER — Other Ambulatory Visit: Payer: Self-pay | Admitting: Obstetrics & Gynecology

## 2018-02-04 ENCOUNTER — Encounter (HOSPITAL_COMMUNITY): Payer: Self-pay

## 2018-02-05 ENCOUNTER — Encounter (HOSPITAL_COMMUNITY): Payer: Self-pay

## 2018-02-13 NOTE — Patient Instructions (Addendum)
Louanna Vanliew  02/13/2018   Your procedure is scheduled on:  02/17/2018  Enter through the Main Entrance of [facility] up the phone at the desk and dial [phone]  Call this number if you have problems the morning of [phone]  Remember:   Do not eat food:(After Midnight) Desps de medianoche.  Do not drink clear liquids: (After Midnight) Desps de medianoche.  Take these medicines the morning of surgery with A SIP OF WATER: lamictal and valtrex   Do not wear jewelry, make-up or nail polish.  Do not wear lotions, powders, or perfumes. Do not wear deodorant.  Do not shave 48 hours prior to surgery.  Do not bring valuables to the hospital.  [facility] is not   responsible for any belongings or valuables brought to the hospital.  Contacts, dentures or bridgework may not be worn into surgery.  Leave suitcase in the car. After surgery it may be brought to your room.  For patients admitted to the hospital, checkout time is 11:00 AM the day of              discharge.    N/A   Please read over the following fact sheets that you were given:   Surgical Site Infection Prevention

## 2018-02-14 ENCOUNTER — Encounter (HOSPITAL_COMMUNITY)
Admission: RE | Admit: 2018-02-14 | Discharge: 2018-02-14 | Disposition: A | Discharge status: Home / Self Care | Payer: Medicaid Other | Source: Ambulatory Visit | Attending: Obstetrics & Gynecology | Admitting: Obstetrics & Gynecology

## 2018-02-14 HISTORY — DX: Depression, unspecified: F32.A

## 2018-02-14 HISTORY — DX: Anxiety disorder, unspecified: F41.9

## 2018-02-14 HISTORY — DX: Major depressive disorder, single episode, unspecified: F32.9

## 2018-02-14 HISTORY — DX: Bipolar disorder, unspecified: F31.9

## 2018-02-14 HISTORY — DX: Anogenital herpesviral infection, unspecified: A60.9

## 2018-02-14 LAB — TYPE AND SCREEN
ABO/RH(D): A POS
ANTIBODY SCREEN: NEGATIVE

## 2018-02-14 LAB — CBC
HCT: 35.1 % — ABNORMAL LOW (ref 36.0–46.0)
HEMOGLOBIN: 12.1 g/dL (ref 12.0–15.0)
MCH: 29.4 pg (ref 26.0–34.0)
MCHC: 34.5 g/dL (ref 30.0–36.0)
MCV: 85.2 fL (ref 78.0–100.0)
PLATELETS: 261 10*3/uL (ref 150–400)
RBC: 4.12 MIL/uL (ref 3.87–5.11)
RDW: 12.7 % (ref 11.5–15.5)
WBC: 6.8 10*3/uL (ref 4.0–10.5)

## 2018-02-15 LAB — RPR: RPR: NONREACTIVE

## 2018-02-16 NOTE — Anesthesia Preprocedure Evaluation (Addendum)
Anesthesia Evaluation  Patient identified by MRN, date of birth, ID band Patient awake    Reviewed: Allergy & Precautions, NPO status , Patient's Chart, lab work & pertinent test results  History of Anesthesia Complications (+) history of anesthetic complications  Airway Mallampati: II  TM Distance: >3 FB Neck ROM: Full    Dental no notable dental hx. (+) Dental Advisory Given   Pulmonary former smoker,    Pulmonary exam normal        Cardiovascular negative cardio ROS Normal cardiovascular exam     Neuro/Psych PSYCHIATRIC DISORDERS Anxiety Depression Bipolar Disorder negative neurological ROS     GI/Hepatic negative GI ROS, Neg liver ROS,   Endo/Other  negative endocrine ROS  Renal/GU negative Renal ROS     Musculoskeletal negative musculoskeletal ROS (+)   Abdominal   Peds  Hematology negative hematology ROS (+)   Anesthesia Other Findings Day of surgery medications reviewed with the patient.  Reproductive/Obstetrics (+) Pregnancy                            Anesthesia Physical Anesthesia Plan  ASA: II  Anesthesia Plan: Spinal   Post-op Pain Management:    Induction:   PONV Risk Score and Plan: 3 and Ondansetron, Dexamethasone and Scopolamine patch - Pre-op  Airway Management Planned: Natural Airway  Additional Equipment:   Intra-op Plan:   Post-operative Plan:   Informed Consent: I have reviewed the patients History and Physical, chart, labs and discussed the procedure including the risks, benefits and alternatives for the proposed anesthesia with the patient or authorized representative who has indicated his/her understanding and acceptance.   Dental advisory given  Plan Discussed with: CRNA, Anesthesiologist and Surgeon  Anesthesia Plan Comments:        Anesthesia Quick Evaluation

## 2018-02-17 ENCOUNTER — Encounter (HOSPITAL_COMMUNITY)
Admission: AD | Disposition: A | Discharge status: Home / Self Care | Payer: Self-pay | Source: Ambulatory Visit | Attending: Obstetrics & Gynecology

## 2018-02-17 ENCOUNTER — Inpatient Hospital Stay (HOSPITAL_COMMUNITY)
Admission: AD | Admit: 2018-02-17 | Discharge: 2018-02-20 | DRG: 788 | Disposition: A | Discharge status: Home / Self Care | Payer: Medicaid Other | Source: Ambulatory Visit | Attending: Obstetrics & Gynecology | Admitting: Obstetrics & Gynecology

## 2018-02-17 ENCOUNTER — Encounter (HOSPITAL_COMMUNITY): Payer: Self-pay | Admitting: *Deleted

## 2018-02-17 ENCOUNTER — Inpatient Hospital Stay (HOSPITAL_COMMUNITY): Payer: Medicaid Other | Admitting: Anesthesiology

## 2018-02-17 DIAGNOSIS — F329 Major depressive disorder, single episode, unspecified: Secondary | ICD-10-CM | POA: Diagnosis present

## 2018-02-17 DIAGNOSIS — Z87891 Personal history of nicotine dependence: Secondary | ICD-10-CM | POA: Diagnosis not present

## 2018-02-17 DIAGNOSIS — A6000 Herpesviral infection of urogenital system, unspecified: Secondary | ICD-10-CM | POA: Diagnosis present

## 2018-02-17 DIAGNOSIS — F419 Anxiety disorder, unspecified: Secondary | ICD-10-CM | POA: Diagnosis present

## 2018-02-17 DIAGNOSIS — Z3A39 39 weeks gestation of pregnancy: Secondary | ICD-10-CM | POA: Diagnosis not present

## 2018-02-17 DIAGNOSIS — O99344 Other mental disorders complicating childbirth: Secondary | ICD-10-CM | POA: Diagnosis present

## 2018-02-17 DIAGNOSIS — F32A Depression, unspecified: Secondary | ICD-10-CM | POA: Diagnosis present

## 2018-02-17 DIAGNOSIS — O9832 Other infections with a predominantly sexual mode of transmission complicating childbirth: Secondary | ICD-10-CM | POA: Diagnosis present

## 2018-02-17 DIAGNOSIS — Z98891 History of uterine scar from previous surgery: Secondary | ICD-10-CM

## 2018-02-17 DIAGNOSIS — F319 Bipolar disorder, unspecified: Secondary | ICD-10-CM | POA: Diagnosis present

## 2018-02-17 SURGERY — Surgical Case
Anesthesia: Spinal

## 2018-02-17 MED ORDER — SIMETHICONE 80 MG PO CHEW
80.0000 mg | CHEWABLE_TABLET | Freq: Three times a day (TID) | ORAL | Status: DC
  Administered 2018-02-18 – 2018-02-19 (×6): 80 mg via ORAL
  Filled 2018-02-17 (×6): qty 1

## 2018-02-17 MED ORDER — LACTATED RINGERS IV SOLN
INTRAVENOUS | Status: DC | PRN
  Administered 2018-02-17: 12:00:00 via INTRAVENOUS

## 2018-02-17 MED ORDER — SIMETHICONE 80 MG PO CHEW
80.0000 mg | CHEWABLE_TABLET | ORAL | Status: DC
  Administered 2018-02-18 – 2018-02-19 (×3): 80 mg via ORAL
  Filled 2018-02-17 (×3): qty 1

## 2018-02-17 MED ORDER — PROMETHAZINE HCL 25 MG/ML IJ SOLN: 6.2500 mg | INTRAMUSCULAR | Status: DC | PRN

## 2018-02-17 MED ORDER — MORPHINE SULFATE (PF) 0.5 MG/ML IJ SOLN
INTRAMUSCULAR | Status: AC
  Filled 2018-02-17: qty 10

## 2018-02-17 MED ORDER — LAMOTRIGINE 200 MG PO TABS
400.0000 mg | ORAL_TABLET | Freq: Every day | ORAL | Status: DC
  Administered 2018-02-17 – 2018-02-19 (×3): 400 mg via ORAL
  Filled 2018-02-17 (×3): qty 2

## 2018-02-17 MED ORDER — FENTANYL CITRATE (PF) 100 MCG/2ML IJ SOLN
INTRAMUSCULAR | Status: AC
  Filled 2018-02-17: qty 2

## 2018-02-17 MED ORDER — MORPHINE SULFATE (PF) 0.5 MG/ML IJ SOLN
INTRAMUSCULAR | Status: DC | PRN
  Administered 2018-02-17: .2 mg via INTRATHECAL

## 2018-02-17 MED ORDER — ONDANSETRON HCL 4 MG/2ML IJ SOLN
INTRAMUSCULAR | Status: AC
  Filled 2018-02-17: qty 2

## 2018-02-17 MED ORDER — NALOXONE HCL 4 MG/10ML IJ SOLN
1.0000 ug/kg/h | INTRAVENOUS | Status: DC | PRN
  Filled 2018-02-17: qty 5

## 2018-02-17 MED ORDER — FENTANYL CITRATE (PF) 100 MCG/2ML IJ SOLN
INTRAMUSCULAR | Status: DC | PRN
  Administered 2018-02-17: 10 ug via INTRATHECAL

## 2018-02-17 MED ORDER — COCONUT OIL OIL: 1.0000 "application " | TOPICAL_OIL | Status: DC | PRN

## 2018-02-17 MED ORDER — BUPIVACAINE HCL 0.25 % IJ SOLN
INTRAMUSCULAR | Status: DC | PRN
  Administered 2018-02-17: 30 mL

## 2018-02-17 MED ORDER — DIPHENHYDRAMINE HCL 25 MG PO CAPS: 25.0000 mg | ORAL_CAPSULE | ORAL | Status: DC | PRN

## 2018-02-17 MED ORDER — SODIUM CHLORIDE 0.9% FLUSH: 3.0000 mL | INTRAVENOUS | Status: DC | PRN

## 2018-02-17 MED ORDER — ZOLPIDEM TARTRATE 5 MG PO TABS: 5.0000 mg | ORAL_TABLET | Freq: Every evening | ORAL | Status: DC | PRN

## 2018-02-17 MED ORDER — OXYTOCIN 40 UNITS IN LACTATED RINGERS INFUSION - SIMPLE MED: 2.5000 [IU]/h | INTRAVENOUS | Status: AC

## 2018-02-17 MED ORDER — DIPHENHYDRAMINE HCL 50 MG/ML IJ SOLN: 12.5000 mg | INTRAMUSCULAR | Status: DC | PRN

## 2018-02-17 MED ORDER — TETANUS-DIPHTH-ACELL PERTUSSIS 5-2.5-18.5 LF-MCG/0.5 IM SUSP: 0.5000 mL | Freq: Once | INTRAMUSCULAR | Status: DC

## 2018-02-17 MED ORDER — LACTATED RINGERS IV SOLN
INTRAVENOUS | Status: DC
  Administered 2018-02-17 – 2018-02-18 (×2): via INTRAVENOUS

## 2018-02-17 MED ORDER — IBUPROFEN 600 MG PO TABS
600.0000 mg | ORAL_TABLET | Freq: Four times a day (QID) | ORAL | Status: DC
  Administered 2018-02-18 – 2018-02-20 (×10): 600 mg via ORAL
  Filled 2018-02-17 (×11): qty 1

## 2018-02-17 MED ORDER — PRENATAL MULTIVITAMIN CH
1.0000 | ORAL_TABLET | Freq: Every day | ORAL | Status: DC
  Administered 2018-02-18 – 2018-02-19 (×2): 1 via ORAL
  Filled 2018-02-17 (×2): qty 1

## 2018-02-17 MED ORDER — ONDANSETRON HCL 4 MG/2ML IJ SOLN: 4.0000 mg | Freq: Three times a day (TID) | INTRAMUSCULAR | Status: DC | PRN

## 2018-02-17 MED ORDER — CEFAZOLIN SODIUM-DEXTROSE 2-4 GM/100ML-% IV SOLN
2.0000 g | INTRAVENOUS | Status: AC
  Administered 2018-02-17: 2 g via INTRAVENOUS

## 2018-02-17 MED ORDER — PHENYLEPHRINE 8 MG IN D5W 100 ML (0.08MG/ML) PREMIX OPTIME
INJECTION | INTRAVENOUS | Status: AC
  Filled 2018-02-17: qty 100

## 2018-02-17 MED ORDER — MENTHOL 3 MG MT LOZG: 1.0000 | LOZENGE | OROMUCOSAL | Status: DC | PRN

## 2018-02-17 MED ORDER — HYDROXYZINE HCL 10 MG PO TABS
20.0000 mg | ORAL_TABLET | Freq: Two times a day (BID) | ORAL | Status: DC | PRN
  Filled 2018-02-17: qty 2

## 2018-02-17 MED ORDER — SIMETHICONE 80 MG PO CHEW
80.0000 mg | CHEWABLE_TABLET | ORAL | Status: DC | PRN
Start: 2018-02-17 — End: 2018-02-20

## 2018-02-17 MED ORDER — NALBUPHINE HCL 10 MG/ML IJ SOLN: 5.0000 mg | INTRAMUSCULAR | Status: DC | PRN

## 2018-02-17 MED ORDER — ONDANSETRON HCL 4 MG/2ML IJ SOLN
INTRAMUSCULAR | Status: DC | PRN
  Administered 2018-02-17: 4 mg via INTRAVENOUS

## 2018-02-17 MED ORDER — OXYCODONE-ACETAMINOPHEN 5-325 MG PO TABS
2.0000 | ORAL_TABLET | ORAL | Status: DC | PRN
Start: 2018-02-17 — End: 2018-02-20
  Administered 2018-02-18 (×4): 2 via ORAL
  Filled 2018-02-17 (×4): qty 2

## 2018-02-17 MED ORDER — LACTATED RINGERS IV SOLN
INTRAVENOUS | Status: DC | PRN
  Administered 2018-02-17 (×3): via INTRAVENOUS

## 2018-02-17 MED ORDER — DIPHENHYDRAMINE HCL 25 MG PO CAPS: 25.0000 mg | ORAL_CAPSULE | Freq: Four times a day (QID) | ORAL | Status: DC | PRN

## 2018-02-17 MED ORDER — NALOXONE HCL 0.4 MG/ML IJ SOLN: 0.4000 mg | INTRAMUSCULAR | Status: DC | PRN

## 2018-02-17 MED ORDER — SCOPOLAMINE 1 MG/3DAYS TD PT72
MEDICATED_PATCH | TRANSDERMAL | Status: AC
  Filled 2018-02-17: qty 1

## 2018-02-17 MED ORDER — NALBUPHINE HCL 10 MG/ML IJ SOLN: 5.0000 mg | Freq: Once | INTRAMUSCULAR | Status: DC | PRN

## 2018-02-17 MED ORDER — ACETAMINOPHEN 325 MG PO TABS
650.0000 mg | ORAL_TABLET | ORAL | Status: DC | PRN
  Administered 2018-02-19 – 2018-02-20 (×3): 650 mg via ORAL
  Filled 2018-02-17 (×5): qty 2

## 2018-02-17 MED ORDER — DIBUCAINE 1 % RE OINT: 1.0000 "application " | TOPICAL_OINTMENT | RECTAL | Status: DC | PRN

## 2018-02-17 MED ORDER — MEPERIDINE HCL 25 MG/ML IJ SOLN: 6.2500 mg | INTRAMUSCULAR | Status: DC | PRN

## 2018-02-17 MED ORDER — FENTANYL CITRATE (PF) 100 MCG/2ML IJ SOLN
25.0000 ug | INTRAMUSCULAR | Status: DC | PRN
  Administered 2018-02-17: 50 ug via INTRAVENOUS
  Administered 2018-02-17: 25 ug via INTRAVENOUS

## 2018-02-17 MED ORDER — SCOPOLAMINE 1 MG/3DAYS TD PT72
MEDICATED_PATCH | TRANSDERMAL | Status: AC
  Filled 2018-02-17: qty 2

## 2018-02-17 MED ORDER — DEXAMETHASONE SODIUM PHOSPHATE 4 MG/ML IJ SOLN
INTRAMUSCULAR | Status: AC
  Filled 2018-02-17: qty 1

## 2018-02-17 MED ORDER — OXYCODONE-ACETAMINOPHEN 5-325 MG PO TABS
1.0000 | ORAL_TABLET | ORAL | Status: DC | PRN
  Administered 2018-02-18 – 2018-02-19 (×2): 1 via ORAL
  Filled 2018-02-17 (×2): qty 1

## 2018-02-17 MED ORDER — DEXAMETHASONE SODIUM PHOSPHATE 10 MG/ML IJ SOLN
INTRAMUSCULAR | Status: DC | PRN
  Administered 2018-02-17: 10 mg via INTRAVENOUS

## 2018-02-17 MED ORDER — LACTATED RINGERS IV SOLN
INTRAVENOUS | Status: DC
  Administered 2018-02-17: 50 mL/h via INTRAVENOUS
  Administered 2018-02-17: 11:00:00 via INTRAVENOUS

## 2018-02-17 MED ORDER — BUPIVACAINE HCL (PF) 0.25 % IJ SOLN
INTRAMUSCULAR | Status: AC
  Filled 2018-02-17: qty 30

## 2018-02-17 MED ORDER — OXYTOCIN 10 UNIT/ML IJ SOLN
INTRAVENOUS | Status: DC | PRN
  Administered 2018-02-17: 40 [IU] via INTRAVENOUS

## 2018-02-17 MED ORDER — OXYTOCIN 10 UNIT/ML IJ SOLN
INTRAMUSCULAR | Status: AC
  Filled 2018-02-17: qty 4

## 2018-02-17 MED ORDER — MEPERIDINE HCL 25 MG/ML IJ SOLN
INTRAMUSCULAR | Status: AC
  Filled 2018-02-17: qty 1

## 2018-02-17 MED ORDER — SCOPOLAMINE 1 MG/3DAYS TD PT72
1.0000 | MEDICATED_PATCH | Freq: Once | TRANSDERMAL | Status: DC
  Filled 2018-02-17: qty 1

## 2018-02-17 MED ORDER — SENNOSIDES-DOCUSATE SODIUM 8.6-50 MG PO TABS
2.0000 | ORAL_TABLET | ORAL | Status: DC
  Administered 2018-02-18 – 2018-02-19 (×3): 2 via ORAL
  Filled 2018-02-17 (×3): qty 2

## 2018-02-17 MED ORDER — SODIUM CHLORIDE 0.9 % IR SOLN
Status: DC | PRN
  Administered 2018-02-17: 1

## 2018-02-17 MED ORDER — HYDROXYZINE HCL 10 MG PO TABS
20.0000 mg | ORAL_TABLET | Freq: Once | ORAL | Status: AC
  Administered 2018-02-17: 20 mg via ORAL
  Filled 2018-02-17: qty 2

## 2018-02-17 MED ORDER — WITCH HAZEL-GLYCERIN EX PADS: 1.0000 "application " | MEDICATED_PAD | CUTANEOUS | Status: DC | PRN

## 2018-02-17 MED ORDER — BUPIVACAINE IN DEXTROSE 0.75-8.25 % IT SOLN
INTRATHECAL | Status: DC | PRN
  Administered 2018-02-17: 1.4 mL via INTRATHECAL

## 2018-02-17 MED ORDER — SCOPOLAMINE 1 MG/3DAYS TD PT72
MEDICATED_PATCH | TRANSDERMAL | Status: DC | PRN
  Administered 2018-02-17: 1 via TRANSDERMAL

## 2018-02-17 MED ORDER — MEPERIDINE HCL 25 MG/ML IJ SOLN
INTRAMUSCULAR | Status: DC | PRN
  Administered 2018-02-17: 12.5 mg via INTRAVENOUS

## 2018-02-17 MED ORDER — HYDROXYZINE HCL 10 MG PO TABS: 10.0000 mg | ORAL_TABLET | Freq: Two times a day (BID) | ORAL | Status: DC | PRN

## 2018-02-17 MED ORDER — PHENYLEPHRINE 8 MG IN D5W 100 ML (0.08MG/ML) PREMIX OPTIME
INJECTION | INTRAVENOUS | Status: DC | PRN
  Administered 2018-02-17: 60 ug/min via INTRAVENOUS

## 2018-02-17 SURGICAL SUPPLY — 39 items
BENZOIN TINCTURE PRP APPL 2/3 (GAUZE/BANDAGES/DRESSINGS) ×2 IMPLANT
CHLORAPREP W/TINT 26ML (MISCELLANEOUS) ×2 IMPLANT
CLAMP CORD UMBIL (MISCELLANEOUS) IMPLANT
CLOTH BEACON ORANGE TIMEOUT ST (SAFETY) ×2 IMPLANT
DRSG OPSITE POSTOP 4X10 (GAUZE/BANDAGES/DRESSINGS) ×2 IMPLANT
DRSG PAD ABDOMINAL 8X10 ST (GAUZE/BANDAGES/DRESSINGS) ×2 IMPLANT
ELECT REM PT RETURN 9FT ADLT (ELECTROSURGICAL) ×2
ELECTRODE REM PT RTRN 9FT ADLT (ELECTROSURGICAL) ×1 IMPLANT
EXTRACTOR VACUUM BELL STYLE (SUCTIONS) ×2 IMPLANT
EXTRACTOR VACUUM KIWI (MISCELLANEOUS) IMPLANT
GLOVE BIO SURGEON STRL SZ 6.5 (GLOVE) ×2 IMPLANT
GLOVE BIOGEL PI IND STRL 7.0 (GLOVE) ×2 IMPLANT
GLOVE BIOGEL PI INDICATOR 7.0 (GLOVE) ×2
GOWN STRL REUS W/TWL LRG LVL3 (GOWN DISPOSABLE) ×6 IMPLANT
KIT ABG SYR 3ML LUER SLIP (SYRINGE) IMPLANT
NEEDLE HYPO 22GX1.5 SAFETY (NEEDLE) ×4 IMPLANT
NEEDLE HYPO 25X5/8 SAFETYGLIDE (NEEDLE) IMPLANT
NS IRRIG 1000ML POUR BTL (IV SOLUTION) ×2 IMPLANT
PACK C SECTION WH (CUSTOM PROCEDURE TRAY) ×2 IMPLANT
PAD OB MATERNITY 4.3X12.25 (PERSONAL CARE ITEMS) ×2 IMPLANT
PENCIL SMOKE EVAC W/HOLSTER (ELECTROSURGICAL) ×2 IMPLANT
RTRCTR C-SECT PINK 25CM LRG (MISCELLANEOUS) ×2 IMPLANT
SPONGE GAUZE 4X4 12PLY STER LF (GAUZE/BANDAGES/DRESSINGS) ×4 IMPLANT
SPONGE LAP 18X18 X RAY DECT (DISPOSABLE) ×2 IMPLANT
STRIP CLOSURE SKIN 1/2X4 (GAUZE/BANDAGES/DRESSINGS) ×2 IMPLANT
SUT CHROMIC 2 0 CT 1 (SUTURE) ×4 IMPLANT
SUT MNCRL 0 VIOLET CTX 36 (SUTURE) ×2 IMPLANT
SUT MONOCRYL 0 CTX 36 (SUTURE) ×2
SUT PDS AB 0 CTX 36 PDP370T (SUTURE) IMPLANT
SUT PLAIN 2 0 (SUTURE)
SUT PLAIN ABS 2-0 CT1 27XMFL (SUTURE) IMPLANT
SUT VIC AB 0 CT1 27 (SUTURE) ×1
SUT VIC AB 0 CT1 27XBRD ANBCTR (SUTURE) ×1 IMPLANT
SUT VIC AB 0 CTX 36 (SUTURE) ×2
SUT VIC AB 0 CTX36XBRD ANBCTRL (SUTURE) ×2 IMPLANT
SUT VIC AB 4-0 KS 27 (SUTURE) ×2 IMPLANT
SYR CONTROL 10ML LL (SYRINGE) ×2 IMPLANT
TOWEL OR 17X24 6PK STRL BLUE (TOWEL DISPOSABLE) ×2 IMPLANT
TRAY FOLEY W/BAG SLVR 14FR LF (SET/KITS/TRAYS/PACK) ×2 IMPLANT

## 2018-02-17 NOTE — Anesthesia Postprocedure Evaluation (Signed)
Anesthesia Post Note  Patient: Karen [name]  Procedure(s) Performed: CESAREAN SECTION (N/A )     Patient location during evaluation: PACU Anesthesia Type: Spinal Level of consciousness: awake and alert Pain management: pain level controlled Vital Signs Assessment: post-procedure vital signs reviewed and stable Respiratory status: spontaneous breathing and respiratory function stable Cardiovascular status: blood pressure returned to baseline and stable Postop Assessment: spinal receding Anesthetic complications: no    Last Vitals:  Vitals:   02/17/18 1330 02/17/18 1345  BP: 115/73   Pulse: 61 69  Resp: 19 16  Temp:  36.6 C  SpO2: 98%     Last Pain:  Vitals:   02/17/18 1345  TempSrc: Oral  PainSc: 3    Pain Goal:                 Kue Fox DANIEL

## 2018-02-17 NOTE — Anesthesia Procedure Notes (Signed)
Spinal  Patient location during procedure: OR Start time: 02/17/2018 11:29 AM End time: 02/17/2018 11:29 AM Staffing Anesthesiologist: Duane Boston, MD Performed: other anesthesia staff  Preanesthetic Checklist Completed: patient identified, site marked, surgical consent, pre-op evaluation, timeout performed, IV checked, risks and benefits discussed and monitors and equipment checked Spinal Block Patient position: sitting Prep: ChloraPrep Patient monitoring: heart rate, continuous pulse ox, blood pressure and cardiac monitor Approach: midline Location: L3-4 Injection technique: single-shot Needle Needle type: Pencan  Needle gauge: 24 G Needle length: 10 cm Assessment Sensory level: T6 Additional Notes Spinal performed by Tawanna Sat. Dr. Tobias Alexander at bedside

## 2018-02-17 NOTE — H&P (Signed)
Karen [name] is a 27 y.o. female G2P0010 at 39 weeks 2 days presents for scheduled primary cesarean section.  Patient requested to have a primary cesarean section as she has had several bad HSV outbreaks during the pregnancy and she is anxious about transmission to the fetus.   OB History    Gravida  2   Para      Term      Preterm      AB  1   Living        SAB  1   TAB      Ectopic      Multiple      Live Births             Past Medical History:  Diagnosis Date  . Anxiety   . Bipolar disorder ([place])   . Chronic UTI   . Depression   . Endometriosis   . HSV (herpes simplex virus) anogenital infection    Past Surgical History:  Procedure Laterality Date  . DIAGNOSTIC LAPAROSCOPY  2014  . DILATION AND EVACUATION N/A 03/11/2015   Procedure: DILATATION AND EVACUATION;  Surgeon: Waymon Amato, MD;  Location: [facility];  Service: Gynecology;  Laterality: N/A;  . TONSILLECTOMY  2015   Family History: family history is not on file. Social History:  reports that she quit smoking about a year ago. Her smoking use included cigarettes. She smoked 1.00 pack per day. She has never used smokeless tobacco. She reports that she has current or past drug history. Drugs: Heroin, Cocaine, and Marijuana. She reports that she does not drink alcohol.     Maternal Diabetes: No Genetic Screening: Normal Maternal Ultrasounds/Referrals: Normal Fetal Ultrasounds or other Referrals:  None Maternal Substance Abuse:  No Significant Maternal Medications:  Meds include: Protonix Other: Lamictal, Valtrex Significant Maternal Lab Results:  Lab values include: Group B Strep negative Other Comments:  None  Review of Systems  Constitutional: Negative.   Eyes: Negative.   Gastrointestinal: Negative.   Skin: Negative.   All other systems reviewed and are negative.  History   Last menstrual period 05/18/2017, unknown if currently breastfeeding. Exam Physical Exam  Prenatal labs: ABO, Rh:  --/--/A POS (04/12 1039) Antibody: NEG (04/12 1039) Rubella: Immune (08/30 0000) RPR: Non Reactive (04/12 1039)  HBsAg: Negative (08/30 0000)  HIV: Non-reactive (08/30 0000)  GBS: Negative (03/25 0000)   Assessment/Plan: 27 yo G2P0010 for elective primary cesarean delivery Ancef 2GM IV on call to OR Discussed R/B/A/I for the surgery with the patient in the office   [place], [place] 02/17/2018, 9:23 AM

## 2018-02-17 NOTE — Transfer of Care (Signed)
Immediate Anesthesia Transfer of Care Note  Patient: Karen [name]  Procedure(s) Performed: CESAREAN SECTION (N/A )  Patient Location: PACU  Anesthesia Type:Spinal  Level of Consciousness: awake, alert  and oriented  Airway & Oxygen Therapy: Patient Spontanous Breathing  Post-op Assessment: Report given to RN and Post -op Vital signs reviewed and stable  Post vital signs: Reviewed and stable   HR 68 resp 14 sats 100% BP 114/71  Last Vitals:  Vitals Value Taken Time  BP 114/71 02/17/2018 12:53 PM  Temp    Pulse 66 02/17/2018 12:57 PM  Resp 12 02/17/2018 12:57 PM  SpO2 100 % 02/17/2018 12:57 PM  Vitals shown include unvalidated device data.  Last Pain:  Vitals:   02/17/18 0951  PainSc: 0-No pain         Complications: No apparent anesthesia complications

## 2018-02-17 NOTE — Op Note (Signed)
Cesarean Section Procedure Note  Karen [name]  DOB:    Apr 04, 1991  MRN:    [id number]  Date of Surgery:  02/17/2018  Indication: 38 week 2 day SIUP brought to operating room for elective primary cesarean section due to maternal request for fear of HSV transmission  Pre-operative Diagnosis: Elective primary cesarean section  Post-operative Diagnosis: same  Procedure:  Primary cesarean delivery                         Surgeon: Karen Damme, MD  Assistants: none  Anesthesia: Spinal anesthesia  ASA Class: 2  Procedure Details   The patient was counseled about the risks, benefits, complications of the cesarean section. The patient concurred with the proposed plan, giving informed consent.   The patient was taken to Operating Room # 9, identified as Karen [name] and the procedure verified as C-Section Delivery. A Time Out was held and the above information confirmed.  After spinal was found to adequate , the patient was placed in the dorsal supine position with a leftward tilt, draped and prepped in the usual sterile manner. A Pfannenstiel incision was made with a 10 blade scalpel and the incision carried down through the subcutaneous tissue to the fascia.  The fascia was incised in the midline and the fascial incision was extended laterally with Mayo scissors. The superior aspect of the fascial incision was grasped with Coker clamps x2, tented up and the rectus muscles dissected off sharply with the bovie.  The rectus was then dissected off with blunt dissection and the bovie inferiorly. The rectus muscles were separated in the midline. The abdominal peritoneum was identified, and bluntly entered using surgeons fingers. The peritoneal opening was bluntly extended with gentle pulling.   The Alexis retractor was then deployed. The vesicouterine peritoneum was identified, tented up, entered sharply with Metzenbaum scissors, and the bladder flap was created digitally. Scalpel was then  used to make a low transverse incision on the uterus which was extended laterally with  blunt dissection. There was difficulty delivering the fetal vertex so a Mity Vac vacuum was placed twice and the uterine and skin incisions extended. The [employer] was removed and the fetal vertex was finally delivered from cephalic presentation Female with Apgar scores of 8 at one minute and 9 at five minutes.   After the umbilical cord was clamped and cut the baby was handed off to the waiting Neonatal team. The cord blood was obtained for evaluation. The placenta was spontaneously removed intact.   The uterus was cleared of all clot and debris. The bladder blade was placed inferiorly and the Richardson retractor anteriorly. The uterine incision was repaired with #0 Monocryl in running locked fashion. A second imbricating suture was performed using the same suture. The incision was hemostatic. Ovaries and tubes were inspected and normal. The abdominal cavity was cleared of all clot and debris. The abdominal peritoneum was reapproximated with 2-0 chromic  in a running fashion, the rectus muscles was reapproximated with #2 chromic in interrupted fashion. The fascia was closed with 0 Vicryl in a running fashion. The subcuticular layer was irrigated and all bleeders cauterized.  30 mL of 0.25% Marcaine was injected into the subcutaneous layer.  The Scarpas fascia was re-approximated with interrupted sutures of 2-0 plain.   The skin was closed with 4-0 vicryl in a subcuticular fashion using a Lanny Hurst needle. The incision was dressed with benzoine, steri strips and pressure dressing. All sponge lap  and needle counts were correct x3.   Patient tolerated the procedure well and recovered in stable condition following the procedure.  Instrument, sponge, and needle counts were correct prior the abdominal closure and at the conclusion of the case.   Findings: Live female infant, Apgars 8/9, clear amniotic fluid, placenta normal  3 vessels, normal uterus, bilateral tubes and ovaries  Estimated Blood Loss: 1413 mL  IVF:  3400 mL LR         Drains: Foley catheter  Urine output: 750 mL clear         Specimens: Placenta to L&D          Implants: none         Complications:  None; patient tolerated the procedure well.         Disposition: PACU - hemodynamically stable.   Karen [name]

## 2018-02-18 LAB — CBC
HEMATOCRIT: 29.2 % — AB (ref 36.0–46.0)
HEMOGLOBIN: 10.2 g/dL — AB (ref 12.0–15.0)
MCH: 29.8 pg (ref 26.0–34.0)
MCHC: 34.9 g/dL (ref 30.0–36.0)
MCV: 85.4 fL (ref 78.0–100.0)
Platelets: 254 10*3/uL (ref 150–400)
RBC: 3.42 MIL/uL — ABNORMAL LOW (ref 3.87–5.11)
RDW: 13 % (ref 11.5–15.5)
WBC: 13.7 10*3/uL — AB (ref 4.0–10.5)

## 2018-02-18 MED ORDER — ONDANSETRON 4 MG PO TBDP
4.0000 mg | ORAL_TABLET | Freq: Three times a day (TID) | ORAL | Status: DC | PRN
  Administered 2018-02-18: 4 mg via ORAL
  Filled 2018-02-18 (×2): qty 1

## 2018-02-18 NOTE — Addendum Note (Signed)
Addendum  created 02/18/18 1331 by Flossie Dibble, CRNA   Sign clinical note

## 2018-02-18 NOTE — Progress Notes (Signed)
Subjective: Postpartum Day 1: Cesarean Delivery elective primary due to concern about HSV transmission Patient reports incisional pain, tolerating PO and + flatus.  Pt would like her anxiety medication  Pt would like Equertro restarted.  States on 100mg  once per day.  Objective: Vital signs in last 24 hours: Temp:  [97.6 F (36.4 C)-98.9 F (37.2 C)] 98.1 F (36.7 C) (04/16 0524) Pulse Rate:  [61-77] 77 (04/16 0524) Resp:  [16-20] 16 (04/16 0524) BP: (105-123)/(53-90) 114/90 (04/16 0524) SpO2:  [97 %-100 %] 97 % (04/16 0524) Weight:  [69.9 kg (154 lb)] 69.9 kg (154 lb) (04/15 1410)  Physical Exam:  General: alert, cooperative and no distress Lochia: appropriate Uterine Fundus: firm Incision: covered with pressure dressing DVT Evaluation: No evidence of DVT seen on physical exam.  Recent Labs    02/18/18 0600  HGB 10.2*  HCT 29.2*    Assessment/Plan: Status post Cesarean section.  Need to verify medication before ordering Equetro. Continue current care.  Plans on out pt circ.  Pleas Koch Kelia Gibbon 02/18/2018, 8:38 AM

## 2018-02-18 NOTE — Clinical Social Work Maternal (Signed)
CLINICAL SOCIAL WORK MATERNAL/CHILD NOTE  Patient Details  Name: Karen [name] MRN: [id number] Date of Birth: 04/18/1991  Date:  2018-02-20  Clinical Social Worker Initiating Note:  Terri Piedra, [name] Date/Time: Initiated:  02/18/18/1430     Child's Name:  Cameron Sprang   Biological Parents:  Mother, Father(Karen [name] and Alroy Bailiff)   Need for Interpreter:  None   Reason for Referral:  Behavioral Health Concerns   Address:  [place] [address]    Phone number:  [phone] (home)     Additional phone number:   Household Members/Support Persons (HM/SP):   Household Member/Support Person 1   HM/SP Name Relationship DOB or Age  HM/SP -1 Evan Dicola FOB/boyfriend 40  HM/SP -2        HM/SP -3        HM/SP -4        HM/SP -5        HM/SP -6        HM/SP -7        HM/SP -8          Natural Supports (not living in the home):  Friends, Parent(MOB reports that they have good supports.  She states her father is here from [place] now and her mother will come for a month from [facility] once her father leaves.  )   Professional Supports: Therapist, Other (Comment)(Psychiatrist: Dr. Beryle Lathe.  Counselor: Posey Rea)   Employment:     Type of Work:     Education:      Homebound arranged:    Museum/gallery curator Resources:  Medicaid   Other Resources:      Cultural/Religious Considerations Which May Impact Care: None stated.  Strengths:  Ability to meet basic needs , Home prepared for child , Psychotropic Medications, Pediatrician chosen   Psychotropic Medications:  Lamictal, Other meds(Vistaril and Landscape architect)      Pediatrician:    [place] area  Pediatrician List:   [facility]  [place]      Pediatrician Fax Number:    Risk Factors/Current Problems:  Mental Health Concerns    Cognitive State:  Able to Concentrate ,  Alert , Linear Thinking , Insightful , Goal Oriented    Mood/Affect:  Calm , Interested    CSW Assessment: CSW met with MOB in her first floor room/148 to offer support and complete assessment due to dx of Bipolar.  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  68 UDS has already been collected and sent and is negative, but CSW feels CDS should be cancelled.  There is no documented substance use during pregnancy. MOB was pleasant and welcoming of CSW's visit.  Baby had just had his bath and RN student placed baby skin to skin on MOB's chest.  She appeared happy to hold baby, though slightly awkward in her positioning.  She felt baby needed to eat, though he didn't appear to be showing signs of hunger once MOB attempted to give him a bottle.  He spit up and then became very calm and sleepy.  She insisted that he was hungry and CSW was concerned that she was actually force feeding  him.  CSW suggested that he may be tired from the bath and may just want to rest on her chest for a while before trying to eat.  She stated it had been a few hours since he last ate and he was probably hungry.  (After completing assessment, CSW discussed concerns with bedside RN and asked that she help assist with feeding education).   MOB states the c-section went well and that this was the plan for delivery.  She and FOB are excited about baby.  She states they have a good relationship and live together in a home they just moved into on [address]  She reports that FOB has a 15 year old son and that this is her first child.  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  She states she has almost 4 years of sobriety and FOB has over two.   MOB states "we have too much to lost to go back to that (drugs)."  She states they both go to NA meetings and support each other in recovery.  She states that they went to couple's counseling "not because we were having problems, but just to better prepare us for having a baby."   FOB came in to the room and CSW introduced self to him.  He offered to step back out for MOB's privacy, but MOB welcomed him to join the conversation.  CSW provided education regarding PMADs, to which both parents were engaged and attentive.  MOB reports that she is currently in mental health treatment with Dr. Brewington, whom she calls Leslie, and with Latasha Carter for counseling.  She takes Lamictal and Vistaril and states these medications work well for her.  She stopped taking Equetro when she got pregnant because it is not approved for pregnancy and breast feeding, but she states it helps stabilize her mood better than the Lamictal alone, so she allowed baby to breast feed yesterday to get some colostrum, but plans to restart this medication now that she has delivered.  CSW commends her for making her mental health care of utmost importance.  Parents seemed appreciative of the PMAD information and for CSW's concern for their mental health and emotional wellbeing.  CSW Plan/Description:  No Further Intervention Required/No Barriers to Discharge, Perinatal Mood and Anxiety Disorder (PMADs) Education    Serina Nichter Elizabeth, [name] 02/18/2018, 4:23 PM 

## 2018-02-18 NOTE — Anesthesia Postprocedure Evaluation (Signed)
Anesthesia Post Note  Patient: Charlaine Eppard  Procedure(s) Performed: CESAREAN SECTION (N/A )     Patient location during evaluation: Mother Baby Anesthesia Type: Spinal Level of consciousness: awake and alert and oriented Pain management: satisfactory to patient Vital Signs Assessment: post-procedure vital signs reviewed and stable Respiratory status: respiratory function stable and spontaneous breathing Cardiovascular status: blood pressure returned to baseline Postop Assessment: no headache, no backache, spinal receding, patient able to bend at knees and adequate PO intake Anesthetic complications: no    Last Vitals:  Vitals:   02/18/18 0524 02/18/18 1014  BP: 114/90 119/72  Pulse: 77 69  Resp: 16 14  Temp: 36.7 C 37.1 C  SpO2: 97% 99%    Last Pain:  Vitals:   02/18/18 1211  TempSrc:   PainSc: 7    Pain Goal:                 Etherine Mackowiak

## 2018-02-19 NOTE — Progress Notes (Signed)
Subjective: Postpartum Day 2: Cesarean Delivery elective for concern with HSV treatment Patient reports tolerating PO, + flatus and no problems voiding.    Objective: Vital signs in last 24 hours: Temp:  [98.2 F (36.8 C)-98.7 F (37.1 C)] 98.4 F (36.9 C) (04/17 0529) Pulse Rate:  [69-75] 75 (04/17 0529) Resp:  [14-16] 16 (04/16 1855) BP: (115-128)/(61-77) 115/61 (04/17 0529) SpO2:  [98 %-99 %] 98 % (04/16 1855)  Physical Exam:  General: alert, cooperative and no distress Lochia: appropriate Uterine Fundus: firm Incision: healing well DVT Evaluation: No evidence of DVT seen on physical exam. Negative Homan's sign.  Recent Labs    02/18/18 0600  HGB 10.2*  HCT 29.2*    Assessment/Plan: Status post Cesarean section. Doing well postoperatively.  Continue current care.  Pt wishes to stay until tomorrow.  Plans on vasectomy for birth control.  Pleas Koch Locklan Canoy 02/19/2018, 8:15 AM

## 2018-02-20 ENCOUNTER — Other Ambulatory Visit: Payer: Self-pay

## 2018-02-20 MED ORDER — IBUPROFEN 600 MG PO TABS: 600.0000 mg | ORAL_TABLET | Freq: Four times a day (QID) | ORAL | 2 refills | Status: DC | PRN

## 2018-02-20 NOTE — Discharge Summary (Addendum)
OB Discharge Summary     Patient Name: Karen [name] DOB: Jun 14, 1991 MRN: [id number]  Date of admission: 02/17/2018 Delivering MD: Sanjuana Kava   Date of discharge: 02/20/2018  Admitting diagnosis: Anogenital Herpes Intrauterine pregnancy: [redacted]w[redacted]d     Secondary diagnosis:  Principal Problem:   S/P cesarean section Active Problems:   Bipolar 1 disorder ([employer])   Chronic depression   Chronic anxiety  Additional problems: HSV exposure     Discharge diagnosis: Term Pregnancy Delivered and Primary LTCS due to HSV exposure                                                                                                Post partum procedures:NA  Augmentation: NA  Complications: None  Hospital course:  Sceduled C/S   27 y.o. yo G2P1011 at [redacted]w[redacted]d was admitted to the hospital 02/17/2018 for scheduled cesarean section with the following indication:Elective Primary due to HSV exposure. Membrane Rupture Time/Date: 11:53 AM ,02/17/2018   Patient delivered a Viable infant.02/17/2018  Details of operation can be found in separate operative note.  Pateint had an uncomplicated postpartum course.  She is ambulating, tolerating a regular diet, passing flatus, and urinating well. Patient is discharged home in stable condition on  02/20/18  She will restart her anxiolytic medication, Equetro, and has continued her Lamictal during her hospitalization.  She is bottlefeeding and plans outpatient circumcision.  Mood is stable, and patient denies any acute issues.         Physical exam  Vitals:   02/18/18 1855 02/19/18 0529 02/19/18 1852 02/20/18 0621  BP: 128/77 115/61 112/71 116/70  Pulse: 74 75 67 66  Resp: 16  18 16   Temp: 98.2 F (36.8 C) 98.4 F (36.9 C) 97.9 F (36.6 C) 97.6 F (36.4 C)  TempSrc: Oral Oral Oral Oral  SpO2: 98%     Weight:      Height:       General: alert Lochia: appropriate Uterine Fundus: firm Incision: Healing well, old drainage noted on [place]comb, with new dressing  applied prior to d/c. DVT Evaluation: No evidence of DVT seen on physical exam. Negative Homan's sign. Labs: Lab Results  Component Value Date   WBC 13.7 (H) 02/18/2018   HGB 10.2 (L) 02/18/2018   HCT 29.2 (L) 02/18/2018   MCV 85.4 02/18/2018   PLT 254 02/18/2018   CMP Latest Ref Rng & Units 03/10/2015  Glucose 70 - 99 mg/dL 84  BUN 6 - 20 mg/dL 7  Creatinine 0.44 - 1.00 mg/dL 0.43(L)  Sodium 135 - 145 mmol/L 134(L)  Potassium 3.5 - 5.1 mmol/L 3.5  Chloride 101 - 111 mmol/L 102  CO2 22 - 32 mmol/L 24  Calcium 8.9 - 10.3 mg/dL 9.0  Total Protein 6.5 - 8.1 g/dL 6.7  Total Bilirubin 0.3 - 1.2 mg/dL 0.4  Alkaline Phos 38 - 126 U/L 52  AST 15 - 41 U/L 28  ALT 14 - 54 U/L 41    Discharge instruction: per After Visit Summary and "Baby and Me Booklet".  After visit meds:  Allergies as of 02/20/2018  Reactions   Adhesive [tape] Hives   Ciprofloxacin Hives      Medication List    STOP taking these medications   valACYclovir 500 MG tablet Commonly known as:  VALTREX     TAKE these medications   acetaminophen 500 MG tablet Commonly known as:  TYLENOL Take 500-1,000 mg by mouth 2 (two) times daily as needed for moderate pain.   COMFORT FIT MATERNITY SUPP SM Misc 1 Device daily by Does not apply route.   diphenhydramine-acetaminophen 25-500 MG Tabs tablet Commonly known as:  TYLENOL PM Take 1 tablet by mouth at bedtime as needed (sleep).   EQUETRO 100 MG Cp12 12 hr capsule Generic drug:  Carbamazepine Take 100 mg by mouth daily.   hydrOXYzine 10 MG tablet Commonly known as:  ATARAX/VISTARIL Take 10-20 mg by mouth 2 (two) times daily as needed for anxiety.   ibuprofen 600 MG tablet Commonly known as:  ADVIL,MOTRIN Take 1 tablet (600 mg total) by mouth every 6 (six) hours as needed.   lamoTRIgine 200 MG tablet Commonly known as:  LAMICTAL Take 400 mg by mouth every evening.   prenatal multivitamin Tabs tablet Take 1 tablet by mouth daily.      Restart  Equetro (already has RX).  Diet: routine diet  Activity: Advance as tolerated. Pelvic rest for 6 weeks.   Outpatient follow up:6 weeks Follow up Appt:No future appointments. Follow up Visit:No follow-ups on file.  Postpartum contraception: Vasectomy  Newborn Data: Live born female  Birth Weight: 6 lb 13.4 oz (3100 g) APGAR: 8, 9  Newborn Delivery   Birth date/time:  02/17/2018 11:56:00 Delivery type:  C-Section, Low Transverse Trial of labor:  No C-section categorization:  Primary     Baby Feeding: Bottle Disposition:home with mother   02/20/2018 Donnel Saxon, CNM

## 2018-02-20 NOTE — Discharge Instructions (Signed)

## 2018-02-20 NOTE — Lactation Note (Signed)
This note was copied from a baby's chart. Lactation Consultation Note Mom has decided to formula feed only. Patient Name: Karen [name] [id number] Date: 02/20/2018 Reason for consult: Follow-up assessment   Maternal Data    Feeding Feeding Type: Bottle Fed - Formula Nipple Type: Other(NICU green nipple)  LATCH Score                   Interventions    Lactation Tools Discussed/Used     Consult Status Consult Status: Complete Date: 02/20/18    Theodoro Kalata 02/20/2018, 1:17 AM

## 2018-03-29 ENCOUNTER — Encounter (HOSPITAL_COMMUNITY): Payer: Self-pay | Admitting: Obstetrics & Gynecology

## 2018-12-19 ENCOUNTER — Ambulatory Visit: Payer: Medicaid Other | Admitting: Family

## 2019-01-10 ENCOUNTER — Encounter (HOSPITAL_COMMUNITY): Payer: Self-pay | Admitting: Family Medicine

## 2019-01-10 ENCOUNTER — Ambulatory Visit (HOSPITAL_COMMUNITY)
Admission: EM | Admit: 2019-01-10 | Discharge: 2019-01-10 | Disposition: A | Discharge status: Home / Self Care | Payer: Self-pay | Attending: Family Medicine | Admitting: Family Medicine

## 2019-01-10 DIAGNOSIS — J111 Influenza due to unidentified influenza virus with other respiratory manifestations: Secondary | ICD-10-CM | POA: Insufficient documentation

## 2019-01-10 LAB — POCT RAPID STREP A: Streptococcus, Group A Screen (Direct): NEGATIVE

## 2019-01-10 MED ORDER — OSELTAMIVIR PHOSPHATE 75 MG PO CAPS: 75.0000 mg | ORAL_CAPSULE | Freq: Two times a day (BID) | ORAL | 0 refills | Status: DC

## 2019-01-10 MED ORDER — ACETAMINOPHEN 325 MG PO TABS
650.0000 mg | ORAL_TABLET | Freq: Once | ORAL | Status: AC
  Administered 2019-01-10: 650 mg via ORAL

## 2019-01-10 MED ORDER — ACETAMINOPHEN 325 MG PO TABS
ORAL_TABLET | ORAL | Status: AC
  Filled 2019-01-10: qty 2

## 2019-01-10 MED ORDER — IBUPROFEN 800 MG PO TABS: 800.0000 mg | ORAL_TABLET | Freq: Three times a day (TID) | ORAL | 0 refills | Status: DC

## 2019-01-10 NOTE — ED Provider Notes (Signed)
[place]    CSN: [id number] Arrival date & time: 01/10/19  1310     History   Chief Complaint Chief Complaint  Patient presents with  . Back Pain    HPI Karen [name] is a 28 y.o. female.   28 yo woman with flu symptoms and back pain.  The myalgias are worsening over the past 12 hours.  Patient's 56 month old was recently febrile but is better now three days later.  Patient complains of sore throat as well.     Past Medical History:  Diagnosis Date  . Anxiety   . Bipolar disorder ([place])   . Chronic UTI   . Depression   . Endometriosis   . HSV (herpes simplex virus) anogenital infection     Patient Active Problem List   Diagnosis Date Noted  . S/P cesarean section 02/17/2018  . Bipolar 1 disorder ([place]) 03/10/2015  . Chronic depression 03/10/2015  . Chronic anxiety 03/10/2015  . Recurrent UTI 03/10/2015  . Allergy history, drug--Cipro 03/10/2015  . Hx of substance abuse--staying at [facility] 03/10/2015  . Back pain 03/10/2015    Past Surgical History:  Procedure Laterality Date  . CESAREAN SECTION N/A 02/17/2018   Procedure: CESAREAN SECTION;  Surgeon: Sanjuana Kava, MD;  Location: [place];  Service: Obstetrics;  Laterality: N/A;  . DIAGNOSTIC LAPAROSCOPY  2014  . DILATION AND EVACUATION N/A 03/11/2015   Procedure: DILATATION AND EVACUATION;  Surgeon: Waymon Amato, MD;  Location: [facility];  Service: Gynecology;  Laterality: N/A;  . TONSILLECTOMY  2015    OB History    Gravida  2   Para  1   Term  1   Preterm      AB  1   Living  1     SAB  1   TAB      Ectopic      Multiple  0   Live Births  1            Home Medications    Prior to Admission medications   Medication Sig Start Date End Date Taking? Authorizing Provider  Carbamazepine (EQUETRO) 100 MG CP12 12 hr capsule Take 100 mg by mouth daily.   Yes [provider]  hydrOXYzine (ATARAX/VISTARIL) 10 MG tablet Take 10-20 mg by mouth 2 (two) times  daily as needed for anxiety.    Yes [provider]  lamoTRIgine (LAMICTAL) 200 MG tablet Take 400 mg by mouth every evening.    Yes [provider]  acetaminophen (TYLENOL) 500 MG tablet Take 500-1,000 mg by mouth 2 (two) times daily as needed for moderate pain.     [provider]  diphenhydramine-acetaminophen (TYLENOL PM) 25-500 MG TABS tablet Take 1 tablet by mouth at bedtime as needed (sleep).     [provider]  Elastic Bandages & Supports (COMFORT FIT MATERNITY SUPP SM) MISC 1 Device daily by Does not apply route. 09/23/17   Lajean Manes, CNM  ibuprofen (ADVIL,MOTRIN) 800 MG tablet Take 1 tablet (800 mg total) by mouth 3 (three) times daily. 01/10/19   Robyn Haber, MD  oseltamivir (TAMIFLU) 75 MG capsule Take 1 capsule (75 mg total) by mouth every 12 (twelve) hours. 01/10/19   Robyn Haber, MD  Prenatal Vit-Fe Fumarate-FA (PRENATAL MULTIVITAMIN) TABS tablet Take 1 tablet by mouth daily.     [provider]    Family History History reviewed. No pertinent family history.  Social History Social History   Tobacco  Use  . Smoking status: Former Smoker    Packs/day: 1.00    Types: Cigarettes    Last attempt to quit: 03/05/2017    Years since quitting: 1.8  . Smokeless tobacco: Never Used  Substance Use Topics  . Alcohol use: No  . Drug use: Yes    Types: Heroin, Cocaine, Marijuana    Comment: Clean since Aug 2015     Allergies   Adhesive [tape] and Ciprofloxacin   Review of Systems Review of Systems   Physical Exam Triage Vital Signs ED Triage Vitals  Enc Vitals Group     BP      Pulse      Resp      Temp      Temp src      SpO2      Weight      Height      Head Circumference      Peak Flow      Pain Score      Pain Loc      Pain Edu?      Excl. in [place]?    No data found.  Updated Vital Signs BP 111/73 (BP Location: Right Arm)   Pulse 95   Temp (!) 102.6 F (39.2 C) (Oral)   Resp 20   SpO2 97%     Physical Exam Vitals signs and nursing note reviewed.  Constitutional:      General: She is in acute distress.     Appearance: Normal appearance. She is normal weight. She is not ill-appearing, toxic-appearing or diaphoretic.  HENT:     Head: Normocephalic.     Right Ear: Tympanic membrane and external ear normal.     Left Ear: Tympanic membrane and external ear normal.     Nose: Nose normal.     Mouth/Throat:     Mouth: Mucous membranes are moist.     Pharynx: Posterior oropharyngeal erythema present. No oropharyngeal exudate.  Eyes:     Conjunctiva/sclera: Conjunctivae normal.  Neck:     Musculoskeletal: Normal range of motion and neck supple.  Cardiovascular:     Rate and Rhythm: Normal rate and regular rhythm.     Heart sounds: Normal heart sounds.  Pulmonary:     Effort: Pulmonary effort is normal.     Breath sounds: Normal breath sounds.  Musculoskeletal: Normal range of motion.  Skin:    General: Skin is warm and dry.  Neurological:     General: No focal deficit present.     Mental Status: She is alert.  Psychiatric:     Comments: Anxious with rapid mood swings during interview.      UC Treatments / Results  Labs (all labs ordered are listed, but only abnormal results are displayed) Labs Reviewed  CULTURE, GROUP A STREP [facility])  POCT RAPID STREP A    EKG None  Radiology No results found.  Procedures Procedures (including critical care time)  Medications Ordered in UC Medications  acetaminophen (TYLENOL) tablet 650 mg (650 mg Oral Given 01/10/19 1350)    Initial Impression / Assessment and Plan / UC Course  I have reviewed the triage vital signs and the nursing notes.  Pertinent labs & imaging results that were available during my care of the patient were reviewed by me and considered in my medical decision making (see chart for details).    Final Clinical Impressions(s) / UC Diagnoses   Final diagnoses:  Influenza   Discharge Instructions    None  ED Prescriptions    Medication Sig Dispense Auth. Provider   oseltamivir (TAMIFLU) 75 MG capsule Take 1 capsule (75 mg total) by mouth every 12 (twelve) hours. 10 capsule Robyn Haber, MD   ibuprofen (ADVIL,MOTRIN) 800 MG tablet Take 1 tablet (800 mg total) by mouth 3 (three) times daily. 21 tablet Robyn Haber, MD     Controlled Substance Prescriptions [place] Controlled Substance Registry consulted? Not Applicable   Robyn Haber, MD 01/10/19 [phone]

## 2019-01-10 NOTE — ED Triage Notes (Signed)
Back pain and bilateral leg pain onset 2 days ago.  At the same time has had general aches, sore throat, right ear pain, headache.    Patient has seen a physical therapist for back issues.

## 2019-01-12 LAB — CULTURE, GROUP A STREP (THRC)

## 2019-01-13 ENCOUNTER — Encounter: Payer: Self-pay | Admitting: Family

## 2019-03-11 ENCOUNTER — Ambulatory Visit: Payer: Self-pay | Admitting: Family

## 2019-08-19 ENCOUNTER — Other Ambulatory Visit: Payer: Self-pay

## 2019-08-19 DIAGNOSIS — Z20822 Contact with and (suspected) exposure to covid-19: Secondary | ICD-10-CM

## 2019-08-20 LAB — NOVEL CORONAVIRUS, NAA: SARS-CoV-2, NAA: NOT DETECTED

## 2019-11-11 ENCOUNTER — Other Ambulatory Visit: Payer: Self-pay

## 2019-11-12 ENCOUNTER — Inpatient Hospital Stay (HOSPITAL_COMMUNITY)
Admission: RE | Admit: 2019-11-12 | Discharge: 2019-11-16 | DRG: 885 | Disposition: A | Discharge status: Home / Self Care | Payer: 59 | Attending: Psychiatry | Admitting: Psychiatry

## 2019-11-12 ENCOUNTER — Ambulatory Visit: Payer: 59 | Admitting: Internal Medicine

## 2019-11-12 ENCOUNTER — Encounter: Payer: Self-pay | Admitting: Internal Medicine

## 2019-11-12 VITALS — BP 92/62 | HR 97 | Temp 98.4°F | Ht 61.0 in | Wt 102.7 lb

## 2019-11-12 DIAGNOSIS — F419 Anxiety disorder, unspecified: Secondary | ICD-10-CM | POA: Diagnosis not present

## 2019-11-12 DIAGNOSIS — Z888 Allergy status to other drugs, medicaments and biological substances status: Secondary | ICD-10-CM

## 2019-11-12 DIAGNOSIS — F17290 Nicotine dependence, other tobacco product, uncomplicated: Secondary | ICD-10-CM | POA: Diagnosis present

## 2019-11-12 DIAGNOSIS — F319 Bipolar disorder, unspecified: Secondary | ICD-10-CM | POA: Diagnosis not present

## 2019-11-12 DIAGNOSIS — R45851 Suicidal ideations: Secondary | ICD-10-CM | POA: Diagnosis present

## 2019-11-12 DIAGNOSIS — Z818 Family history of other mental and behavioral disorders: Secondary | ICD-10-CM

## 2019-11-12 DIAGNOSIS — Z79899 Other long term (current) drug therapy: Secondary | ICD-10-CM

## 2019-11-12 DIAGNOSIS — F3130 Bipolar disorder, current episode depressed, mild or moderate severity, unspecified: Principal | ICD-10-CM | POA: Diagnosis present

## 2019-11-12 DIAGNOSIS — Z20822 Contact with and (suspected) exposure to covid-19: Secondary | ICD-10-CM | POA: Diagnosis present

## 2019-11-12 DIAGNOSIS — G4700 Insomnia, unspecified: Secondary | ICD-10-CM | POA: Diagnosis present

## 2019-11-12 LAB — RESPIRATORY PANEL BY RT PCR (FLU A&B, COVID)
Influenza A by PCR: NEGATIVE
Influenza B by PCR: NEGATIVE
SARS Coronavirus 2 by RT PCR: NEGATIVE

## 2019-11-12 MED ORDER — ACETAMINOPHEN 325 MG PO TABS
650.0000 mg | ORAL_TABLET | Freq: Four times a day (QID) | ORAL | Status: DC | PRN
  Administered 2019-11-15 – 2019-11-16 (×2): 650 mg via ORAL
  Filled 2019-11-12 (×2): qty 2

## 2019-11-12 MED ORDER — LAMOTRIGINE 200 MG PO TABS: 400.0000 mg | ORAL_TABLET | Freq: Every evening | ORAL | 0 refills | Status: DC

## 2019-11-12 MED ORDER — ALUM & MAG HYDROXIDE-SIMETH 200-200-20 MG/5ML PO SUSP: 30.0000 mL | ORAL | Status: DC | PRN

## 2019-11-12 MED ORDER — TRAZODONE HCL 50 MG PO TABS
50.0000 mg | ORAL_TABLET | Freq: Every evening | ORAL | Status: DC | PRN
  Administered 2019-11-14: 01:00:00 50 mg via ORAL
  Filled 2019-11-12: qty 1

## 2019-11-12 MED ORDER — BUSPIRONE HCL 15 MG PO TABS: 15.0000 mg | ORAL_TABLET | Freq: Two times a day (BID) | ORAL | 0 refills | Status: DC

## 2019-11-12 MED ORDER — HYDROXYZINE HCL 25 MG PO TABS: 25.0000 mg | ORAL_TABLET | Freq: Three times a day (TID) | ORAL | Status: DC | PRN

## 2019-11-12 MED ORDER — MAGNESIUM HYDROXIDE 400 MG/5ML PO SUSP: 30.0000 mL | Freq: Every day | ORAL | Status: DC | PRN

## 2019-11-12 NOTE — H&P (Signed)
[facility] Provider Admission PAA/H&P  Patient Identification: Karen [name] MRN:  [id number] Date of Evaluation:  11/12/2019 Chief Complaint:  Bipolar 1 disorder ([place]) [F31.9] Principal Diagnosis: Bipolar 1 disorder ([place]) Diagnosis:  Principal Problem:   Bipolar 1 disorder ([place])  History of Present Illness:   Karen [name] is an 29 y.o. female who presents voluntarily to [facility] with complaints of passive suicidal ideation for the past few days. Pt reports she has been having worsening depression and anxiety with some passing thoughts to kill herself. States she had appointment with her PCP today for psychiatric medication change due to ineffectiveness but was unable to get her request. Pt reports she also went to [facility] for the same reason but was told the earliest appointment was on 12/10/2019. Pt sates she feels helpless, worthless, anhedonia, irritable, and anxious. She states she sees a therapist Posey Rea and through [facility]. 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 She takes Lamictal, Buspar and Mirapex. She has had 1 inpatient  in 2013 at [facility] for self cutting and overdose. Pt states she feels she is getting to the point where she was before her past hospitalization. She denies HI, AVH or self harm. Pt states she has lost 15lbs in the past 2 months. She gets about 4-8 hours of sleep. Pt states she does not know if she can contract for safety. Collateral was obtained from Pt's husband Pauleen Danton [id number] per pt's request. Husband states that patient has been declining mentally and he is concerned for her safety and her ability to continue to care for their 69 month old son. Per husband, he is unable to contract for patient's safety.  During evaluation pt is sitting;she is alert/oriented x 4; anxious/cooperative; and mood depressed congruent with affect.  Patient is speaking in a clear tone at moderate volume, and normal pace; with fair eye contact. Her thought process is coherent and relevant; There is no indication that she is currently responding to internal/external stimuli or experiencing delusional thought content.  Patient has answered questions appropriately.     Associated Signs/Symptoms: Depression Symptoms:  depressed mood, anhedonia, feelings of worthlessness/guilt, hopelessness, weight loss, (Hypo) Manic Symptoms:  Irritable Mood, Anxiety Symptoms:  Excessive Worry, Psychotic Symptoms:  NA PTSD Symptoms: NA Total Time spent with patient: 30 minutes  Past Psychiatric History: Yes  Is the patient at risk to self? Yes.    Has the patient been a risk to self in the past 6 months? No.  Has the patient been a risk to self within the distant past? No.  Is the patient a risk to others? No.  Has the patient been a risk to others in the past 6 months? No.  Has the patient been a risk to others within the distant past? No.   Prior Inpatient Therapy: Prior Inpatient Therapy: Yes Prior Therapy Dates: 2013 Prior Therapy Facilty/Provider(s): [facility] Reason for Treatment: SI Prior Outpatient Therapy: Prior Outpatient Therapy: Yes Prior Therapy Dates: Since 2017 Prior Therapy Facilty/Provider(s): [facility] Reason for Treatment: med management & counseling Does patient have an ACCT team?: No Does patient have Intensive In-House Services?  : No Does patient have [facility] services? : No Does patient have P4CC services?: No  Alcohol Screening:   Substance Abuse History in the last 12 months:  No. Consequences of Substance Abuse: NA Previous Psychotropic Medications: Lamictal, Buspar and Mirapex  Psychological Evaluations: Yes  Past Medical History:  Past Medical History:  Diagnosis Date  . Anxiety   . Bipolar disorder ([place])   . Chronic UTI   . Depression   . Endometriosis   . HSV (herpes simplex  virus) anogenital infection     Past Surgical History:  Procedure Laterality Date  . CESAREAN SECTION N/A 02/17/2018   Procedure: CESAREAN SECTION;  Surgeon: Sanjuana Kava, MD;  Location: [place];  Service: Obstetrics;  Laterality: N/A;  . DIAGNOSTIC LAPAROSCOPY  2014  . DILATION AND EVACUATION N/A 03/11/2015   Procedure: DILATATION AND EVACUATION;  Surgeon: Waymon Amato, MD;  Location: [facility];  Service: Gynecology;  Laterality: N/A;  . TONSILLECTOMY  2015   Family History: No family history on file. Family Psychiatric History: Unknown Tobacco Screening:   Social History:  Social History   Substance and Sexual Activity  Alcohol Use No     Social History   Substance and Sexual Activity  Drug Use Yes  . Types: Heroin, Cocaine, Marijuana   Comment: Clean since Aug 2015    Additional Social History: Marital status: Married    Pain Medications: None Prescriptions: Mirapex .75mg  in PM; Lamictal 200mg  (using every other day to not run out); Buspar 15mg  2x/ D (ran out a month ago).  Got a refill this AM on Lamictal, Buspar and Mirapex but has not gone to pharmacy yet. 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 Adhesive [Tape] Hives  . Ciprofloxacin Hives   Lab Results: No results found for this or any previous visit (from the past 48 hour(s)).  Blood Alcohol level:  No results found for: [facility]  Metabolic Disorder Labs:  No results found for: HGBA1C, MPG No results found for: PROLACTIN No results found for: CHOL, TRIG, HDL, CHOLHDL, VLDL, LDLCALC  Current Medications: Current Facility-Administered Medications  Medication Dose Route Frequency Provider Last Rate Last Admin  . acetaminophen (TYLENOL) tablet 650 mg  650 mg Oral Q6H PRN Nimra Puccinelli C, NP      . alum & mag  hydroxide-simeth (MAALOX/MYLANTA) 200-200-20 MG/5ML suspension 30 mL  30 mL Oral Q4H PRN Kiondre Grenz C, NP      . hydrOXYzine (ATARAX/VISTARIL) tablet 25 mg  25 mg Oral TID PRN Savir Blanke C, NP      . magnesium hydroxide (MILK OF MAGNESIA) suspension 30 mL  30 mL Oral Daily PRN Arena Lindahl C, NP      . traZODone (DESYREL) tablet 50 mg  50 mg Oral QHS PRN Rakin Lemelle C, NP       PTA Medications: Medications Prior to Admission  Medication Sig Dispense Refill Last Dose  . busPIRone (BUSPAR) 15 MG tablet Take 1 tablet (15 mg total) by mouth 2 (two) times daily. 60 tablet 0   . lamoTRIgine (LAMICTAL) 200 MG tablet Take 2 tablets (400 mg total) by mouth every evening. 60 tablet 0   . pramipexole (MIRAPEX) 0.75 MG tablet Take 0.75 mg by mouth daily.     . valACYclovir (VALTREX) 500 MG tablet Take 500 mg by mouth 2 (two) times daily as needed.       Musculoskeletal: Strength & Muscle Tone: within normal limits Gait &  Station: normal Patient leans: N/A  Psychiatric Specialty Exam: Physical Exam  Constitutional: She is oriented to person, place, and time. She appears well-developed.  HENT:  Head: Normocephalic.  Eyes: Pupils are equal, round, and reactive to light.  Respiratory: Effort normal.  Musculoskeletal:        General: Normal range of motion.     Cervical back: Normal range of motion.  Neurological: She is alert and oriented to person, place, and time.  Skin: Skin is warm and dry.  Psychiatric: Her speech is normal and behavior is normal. Judgment normal. Her mood appears anxious. Cognition and memory are normal. She exhibits a depressed mood. She expresses suicidal (passive) ideation.    Review of Systems  Psychiatric/Behavioral: Positive for dysphoric mood and suicidal ideas. Negative for decreased concentration and hallucinations. The patient is nervous/anxious. The patient is not hyperactive.   All other systems reviewed and are negative.   Blood pressure 111/69, pulse 80,  temperature 98.1 F (36.7 C), temperature source Oral, resp. rate 16, last menstrual period 11/05/2019, SpO2 100 %, not currently breastfeeding.There is no height or weight on file to calculate BMI.  General Appearance: Casual  Eye Contact:  Fair  Speech:  Normal Rate  Volume:  Normal  Mood:  Depressed, Hopeless and Worthless  Affect:  Congruent and Depressed  Thought Process:  Coherent and Descriptions of Associations: Intact  Orientation:  Full (Time, Place, and Person)  Thought Content:  WDL  Suicidal Thoughts:  Patient says its passive  Homicidal Thoughts:  No  Memory:  Recent;   Good  Judgement:  Fair  Insight:  Fair  Psychomotor Activity:  Normal  Concentration:  Concentration: Good  Recall:  Good  Fund of Knowledge:  Good  Language:  Good  Akathisia:  No  Handed:  Right  AIMS (if indicated):     Assets:  Communication Skills Desire for Improvement Financial Resources/Insurance Housing Intimacy Social Support  ADL's:  Intact  Cognition:  WNL  Sleep:       Disposition: Supportive therapy provided about ongoing stressors. Recommend overniht observation for monitoring and stabilization  Treatment Plan Summary: Daily contact with patient to assess and evaluate symptoms and progress in treatment and Medication management  Observation Level/Precautions:  15 minute checks Laboratory:  Chemistry Profile UDS Psychotherapy:   Medications:   Consultations:   Discharge Concerns:   Estimated LOS: Other:      Mliss Fritz, NP 1/7/202111:10 PM

## 2019-11-12 NOTE — Progress Notes (Signed)
New Patient Office Visit     This visit occurred during the SARS-CoV-2 public health emergency.  Safety protocols were in place, including screening questions prior to the visit, additional usage of staff PPE, and extensive cleaning of exam room while observing appropriate contact time as indicated for disinfecting solutions.    CC/Reason for Visit: Discuss psychiatric medication, discuss anxiety Previous PCP: Has not had one in over 10 years which would have made her previous PCP a pediatrician Last Visit: Remotely  HPI: Karen [name] is a 29 y.o. female who is coming in today for the above mentioned reasons. Past Medical History is significant for: History of bipolar disorder followed by psychiatry currently on Lamictal 400 mg daily, BuSpar 50 mg twice daily, Mirapex 0.25 mg 3 tablets daily.  I am not exactly sure what has happened, but she tells me that she went to seek a second opinion from another psychiatrist but it was too expensive and by the time she returned to her original psychiatrist they refused to prescribe her any medication.  She has now been out of Lamictal and BuSpar for 2 weeks.  She displays visible signs of anxiety today, she is very tearful, she has full body tremors.   Past Medical/Surgical History: Past Medical History:  Diagnosis Date  . Anxiety   . Bipolar disorder ([place])   . Chronic UTI   . Depression   . Endometriosis   . HSV (herpes simplex virus) anogenital infection     Past Surgical History:  Procedure Laterality Date  . CESAREAN SECTION N/A 02/17/2018   Procedure: CESAREAN SECTION;  Surgeon: Sanjuana Kava, MD;  Location: [place] [facility];  Service: Obstetrics;  Laterality: N/A;  . DIAGNOSTIC LAPAROSCOPY  2014  . DILATION AND EVACUATION N/A 03/11/2015   Procedure: DILATATION AND EVACUATION;  Surgeon: Waymon Amato, MD;  Location: [facility];  Service: Gynecology;  Laterality: N/A;  . TONSILLECTOMY  2015    Social History:  reports that she quit smoking  about 2 years ago. Her smoking use included cigarettes. She smoked 1.00 pack per day. She has never used smokeless tobacco. She reports current drug use. Drugs: Heroin, Cocaine, and Marijuana. She reports that she does not drink alcohol.  Allergies: Allergies  Allergen Reactions  . Adhesive [Tape] Hives  . Ciprofloxacin Hives    Family History:  No history of heart disease, cancer, stroke that she is aware of.  Current Outpatient Medications:  .  busPIRone (BUSPAR) 15 MG tablet, Take 1 tablet (15 mg total) by mouth 2 (two) times daily., Disp: 60 tablet, Rfl: 0 .  lamoTRIgine (LAMICTAL) 200 MG tablet, Take 2 tablets (400 mg total) by mouth every evening., Disp: 60 tablet, Rfl: 0 .  pramipexole (MIRAPEX) 0.75 MG tablet, Take 0.75 mg by mouth daily., Disp: , Rfl:  .  valACYclovir (VALTREX) 500 MG tablet, Take 500 mg by mouth 2 (two) times daily as needed., Disp: , Rfl:   Review of Systems:  Constitutional: Denies fever, chills, diaphoresis, appetite change and fatigue.  HEENT: Denies photophobia, eye pain, redness, hearing loss, ear pain, congestion, sore throat, rhinorrhea, sneezing, mouth sores, trouble swallowing, neck pain, neck stiffness and tinnitus.   Respiratory: Denies SOB, DOE, cough, chest tightness,  and wheezing.   Cardiovascular: Denies chest pain, palpitations and leg swelling.  Gastrointestinal: Denies nausea, vomiting, abdominal pain, diarrhea, constipation, blood in stool and abdominal distention.  Genitourinary: Denies dysuria, urgency, frequency, hematuria, flank pain and difficulty urinating.  Endocrine: Denies: hot or cold intolerance,  sweats, changes in hair or nails, polyuria, polydipsia. Musculoskeletal: Denies myalgias, back pain, joint swelling, arthralgias and gait problem.  Skin: Denies pallor, rash and wound.  Neurological: Denies dizziness, seizures, syncope, weakness, light-headedness, numbness and headaches.  Hematological: Denies adenopathy. Easy bruising,  personal or family bleeding history  Psychiatric/Behavioral: Denies suicidal ideation, positive for mood changes,  nervousness, sleep changes.    Physical Exam: Vitals:   11/12/19 0855  BP: 92/62  Pulse: 97  Temp: 98.4 F (36.9 C)  TempSrc: Temporal  SpO2: 98%  Weight: 102 lb 11.2 oz (46.6 kg)  Height: 5\' 1"  (1.549 m)   Body mass index is 19.4 kg/m.   Constitutional: NAD, calm, comfortable Eyes: PERRL, lids and conjunctivae normal ENMT: Mucous membranes are moist.  Respiratory: clear to auscultation bilaterally, no wheezing, no crackles. Normal respiratory effort. No accessory muscle use.  Cardiovascular: Regular rate and rhythm, no murmurs / rubs / gallops. No extremity edema.  Neurologic: Grossly intact and nonfocal Psychiatric: Normal judgment and insight. Alert and oriented x 3.  Mood is anxious and tearful     Office Visit from 11/12/2019 in [place] at [facility]  PHQ-9 Total Score  12       Impression and Plan:  Bipolar 1 disorder ([place])  Chronic anxiety  -I have advised her that I will fill her Lamictal and BuSpar for 1 month but that I do not feel comfortable treating bipolar disorder without the help of a mental health professional. -We have given her information on psychiatric clinics that can see her.  We have also given her the contact information for the [facility] walk-in clinic which she assures that she will visit today.  -She will schedule return visit for CPE.       Lelon Frohlich, MD [facility]
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
## 2019-11-13 ENCOUNTER — Other Ambulatory Visit: Payer: Self-pay

## 2019-11-13 ENCOUNTER — Encounter (HOSPITAL_COMMUNITY): Payer: Self-pay | Admitting: Behavioral Health

## 2019-11-13 DIAGNOSIS — F419 Anxiety disorder, unspecified: Secondary | ICD-10-CM | POA: Diagnosis present

## 2019-11-13 DIAGNOSIS — F319 Bipolar disorder, unspecified: Secondary | ICD-10-CM

## 2019-11-13 DIAGNOSIS — Z888 Allergy status to other drugs, medicaments and biological substances status: Secondary | ICD-10-CM | POA: Diagnosis not present

## 2019-11-13 DIAGNOSIS — Z79899 Other long term (current) drug therapy: Secondary | ICD-10-CM | POA: Diagnosis not present

## 2019-11-13 DIAGNOSIS — F3130 Bipolar disorder, current episode depressed, mild or moderate severity, unspecified: Secondary | ICD-10-CM | POA: Diagnosis present

## 2019-11-13 DIAGNOSIS — R45851 Suicidal ideations: Secondary | ICD-10-CM | POA: Diagnosis present

## 2019-11-13 DIAGNOSIS — G4700 Insomnia, unspecified: Secondary | ICD-10-CM | POA: Diagnosis present

## 2019-11-13 DIAGNOSIS — Z20822 Contact with and (suspected) exposure to covid-19: Secondary | ICD-10-CM | POA: Diagnosis present

## 2019-11-13 DIAGNOSIS — Z818 Family history of other mental and behavioral disorders: Secondary | ICD-10-CM | POA: Diagnosis not present

## 2019-11-13 DIAGNOSIS — F17290 Nicotine dependence, other tobacco product, uncomplicated: Secondary | ICD-10-CM | POA: Diagnosis present

## 2019-11-13 LAB — LIPID PANEL
Cholesterol: 136 mg/dL (ref 0–200)
HDL: 39 mg/dL — ABNORMAL LOW (ref >40)
LDL Cholesterol: 90 mg/dL (ref 0–99)
Total CHOL/HDL Ratio: 3.5 RATIO
Triglycerides: 35 mg/dL (ref <150)
VLDL: 7 mg/dL (ref 0–40)

## 2019-11-13 LAB — HEPATIC FUNCTION PANEL
ALT: 14 U/L (ref 0–44)
AST: 12 U/L — ABNORMAL LOW (ref 15–41)
Albumin: 4.7 g/dL (ref 3.5–5.0)
Alkaline Phosphatase: 54 U/L (ref 38–126)
Bilirubin, Direct: 0.1 mg/dL (ref 0.0–0.2)
Indirect Bilirubin: 0.5 mg/dL (ref 0.3–0.9)
Total Bilirubin: 0.6 mg/dL (ref 0.3–1.2)
Total Protein: 7.5 g/dL (ref 6.5–8.1)

## 2019-11-13 LAB — COMPREHENSIVE METABOLIC PANEL
ALT: 12 U/L (ref 0–44)
AST: 13 U/L — ABNORMAL LOW (ref 15–41)
Albumin: 4.5 g/dL (ref 3.5–5.0)
Alkaline Phosphatase: 53 U/L (ref 38–126)
Anion gap: 9 (ref 5–15)
BUN: 11 mg/dL (ref 6–20)
CO2: 26 mmol/L (ref 22–32)
Calcium: 9.2 mg/dL (ref 8.9–10.3)
Chloride: 104 mmol/L (ref 98–111)
Creatinine, Ser: 0.71 mg/dL (ref 0.44–1.00)
GFR calc Af Amer: >60 mL/min (ref >60)
GFR calc non Af Amer: >60 mL/min (ref >60)
Glucose, Bld: 107 mg/dL — ABNORMAL HIGH (ref 70–99)
Potassium: 4.1 mmol/L (ref 3.5–5.1)
Sodium: 139 mmol/L (ref 135–145)
Total Bilirubin: 0.5 mg/dL (ref 0.3–1.2)
Total Protein: 7.6 g/dL (ref 6.5–8.1)

## 2019-11-13 LAB — ETHANOL: Alcohol, Ethyl (B): <10 mg/dL (ref <10)

## 2019-11-13 LAB — TSH: TSH: 0.616 u[IU]/mL (ref 0.350–4.500)

## 2019-11-13 LAB — MAGNESIUM: Magnesium: 2.4 mg/dL (ref 1.7–2.4)

## 2019-11-13 MED ORDER — BREXPIPRAZOLE 0.25 MG PO TABS
0.5000 mg | ORAL_TABLET | Freq: Two times a day (BID) | ORAL | Status: DC
  Administered 2019-11-13 – 2019-11-16 (×7): 0.5 mg via ORAL
  Filled 2019-11-13 (×11): qty 2

## 2019-11-13 MED ORDER — MAGNESIUM HYDROXIDE 400 MG/5ML PO SUSP: 30.0000 mL | Freq: Every day | ORAL | Status: DC | PRN

## 2019-11-13 MED ORDER — LORAZEPAM 1 MG PO TABS
1.0000 mg | ORAL_TABLET | ORAL | Status: DC | PRN
  Administered 2019-11-13: 1 mg via ORAL
  Filled 2019-11-13: qty 1

## 2019-11-13 MED ORDER — TEMAZEPAM 15 MG PO CAPS
30.0000 mg | ORAL_CAPSULE | Freq: Every day | ORAL | Status: DC
  Administered 2019-11-13: 30 mg via ORAL
  Filled 2019-11-13: qty 2

## 2019-11-13 MED ORDER — ACETAMINOPHEN 325 MG PO TABS: 650.0000 mg | ORAL_TABLET | Freq: Four times a day (QID) | ORAL | Status: DC | PRN

## 2019-11-13 MED ORDER — LAMOTRIGINE 100 MG PO TABS
100.0000 mg | ORAL_TABLET | Freq: Two times a day (BID) | ORAL | Status: DC
  Administered 2019-11-13 – 2019-11-16 (×7): 100 mg via ORAL
  Filled 2019-11-13 (×11): qty 1

## 2019-11-13 MED ORDER — FLUOXETINE HCL 20 MG PO CAPS
20.0000 mg | ORAL_CAPSULE | Freq: Every day | ORAL | Status: DC
  Administered 2019-11-13 – 2019-11-16 (×4): 20 mg via ORAL
  Filled 2019-11-13 (×6): qty 1

## 2019-11-13 MED ORDER — ALUM & MAG HYDROXIDE-SIMETH 200-200-20 MG/5ML PO SUSP
30.0000 mL | ORAL | Status: DC | PRN
  Administered 2019-11-14: 30 mL via ORAL

## 2019-11-13 MED ORDER — ENSURE ENLIVE PO LIQD: 237.0000 mL | ORAL | Status: DC

## 2019-11-13 MED ORDER — PRENATAL MULTIVITAMIN CH
1.0000 | ORAL_TABLET | Freq: Every day | ORAL | Status: DC
  Administered 2019-11-13 – 2019-11-16 (×4): 1 via ORAL
  Filled 2019-11-13 (×5): qty 1

## 2019-11-13 MED ORDER — FOLIC ACID 1 MG PO TABS
1.0000 mg | ORAL_TABLET | Freq: Every day | ORAL | Status: DC
  Administered 2019-11-13 – 2019-11-16 (×4): 1 mg via ORAL
  Filled 2019-11-13 (×6): qty 1

## 2019-11-13 MED ORDER — BOOST / RESOURCE BREEZE PO LIQD CUSTOM
1.0000 | Freq: Two times a day (BID) | ORAL | Status: DC
  Filled 2019-11-13 (×10): qty 1

## 2019-11-13 NOTE — Progress Notes (Signed)
NUTRITION ASSESSMENT RD working remotely.   Pt identified as at risk on the Malnutrition Screen Tool  INTERVENTION: - will order Boost Breeze BID, each supplement provides 250 kcal and 9 grams of protein. - will order Ensure Enlive once/day, each supplement provides 350 kcal and 20 grams of protein. - will order daily multivitamin with minerals.    NUTRITION DIAGNOSIS: Unintentional weight loss related to sub-optimal intake as evidenced by pt report.   Goal: Pt to meet >/= 90% of their estimated nutrition needs.  Monitor:  PO intake  Assessment: Patient admitted for medication adjustment. She has hx of bipolar disorder and has been in a manic episode for the past 1 week.   Per chart review, current weight is 101 lb. PTA, the most recently recorded weight was on 02/17/18 when she weighed 154 lb. This indicates 53 lb weight loss (34% body weight) in the past 22 months.    29 y.o. female  Height: Ht Readings from Last 1 Encounters:  11/13/19 5' 2.21" (1.58 m)    Weight: Wt Readings from Last 1 Encounters:  11/13/19 46 kg    Weight Hx: Wt Readings from Last 10 Encounters:  11/13/19 46 kg  11/12/19 46.6 kg  02/17/18 69.9 kg  02/05/18 65.3 kg  09/23/17 54 kg  07/16/17 48.5 kg  06/19/17 47.2 kg  03/10/15 50.3 kg    BMI:  Body mass index is 18.43 kg/m. Pt meets criteria for underweight based on current BMI.  Estimated Nutritional Needs: Kcal: 25-30 kcal/kg Protein: > 1 gram protein/kg Fluid: 1 ml/kcal  Diet Order:  Diet Order            Diet regular Room service appropriate? Yes; Fluid consistency: Thin  Diet effective now             Pt is also offered choice of unit snacks mid-morning and mid-afternoon.  Pt is eating as desired.   Lab results and medications reviewed.     Jarome Matin, MS, RD, LDN, [facility] Inpatient Clinical Dietitian Pager # [phone] After hours/weekend pager # [phone]

## 2019-11-13 NOTE — Tx Team (Signed)
Initial Treatment Plan 11/13/2019 1:06 PM Karen [name] [id number]    PATIENT STRESSORS: Medication change or noncompliance     PATIENT STRENGTHS: Average or above average intelligence Capable of independent living Communication skills General fund of knowledge Motivation for treatment/growth Supportive family/friends   PATIENT IDENTIFIED PROBLEMS:   " medication adjustment"       "panic attacks"     " I want to be able to function normally"         DISCHARGE CRITERIA:  Improved stabilization in mood, thinking, and/or behavior Reduction of life-threatening or endangering symptoms to within safe limits Verbal commitment to aftercare and medication compliance  PRELIMINARY DISCHARGE PLAN: Outpatient therapy Return to previous living arrangement  PATIENT/FAMILY INVOLVEMENT: This treatment plan has been presented to and reviewed with the patient, Karen [name],  The patient has been given the opportunity to ask questions and make suggestions.  Waymond Cera, RN 11/13/2019, 1:06 PM

## 2019-11-13 NOTE — BH Assessment (Signed)
[place] Assessment Progress Note  Per Johnn Hai, MD, this voluntary pt requires psychiatric hospitalization at this time.  Kathalene Frames, RN has assigned pt to [facility] Rm [id number].  Pt's nurse, Benjamine Mola, has been notified.  Jalene Mullet, [place] Coordinator [phone]
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
## 2019-11-13 NOTE — Progress Notes (Signed)
[place] Note: Patient presents with a flat affect and depressed mood.  Currently denies suicidal ideation while in the hospital.  Medications given as prescribed. Routine safety checks maintained every 15 minutes.  Patient is safe on the unit.

## 2019-11-13 NOTE — Plan of Care (Signed)
[place] Observation Crisis Plan  Reason for Crisis Plan:  Medication Management   Plan of Care:  Referral for Inpatient Hospitalization  Family Support:      Current Living Environment:  Living Arrangements: Spouse/significant other, Children  Insurance:   Hospital Account    Name Acct ID Class Status Primary Coverage   Trinidy, Kernell [id number] [place]        Guarantor Account (for Hospital Account [id number])    Name Relation to Pt Service Area Active? Acct Type   Philomena Course Self [facility] Yes [facility]   Address Phone       [place], [place] [address] [phone])          Coverage Information (for Hospital Account [id number])    F/O Payor/Plan Precert #   [place] #   Aerie, [place] [id number]   [place]   [address] [place] [address]       Legal Guardian:     Primary Care Provider:  Isaac Bliss, Rayford Halsted, MD  Current Outpatient Providers:  Beverly Sessions  Psychiatrist:  Name of Psychiatrist: Ottie Glazier [facility]  Counselor/Therapist:  Name of Therapist: Lucillie Garfinkel   Compliant with Medications:  Yes  Additional Information:   Wolfgang Phoenix 1/8/20217:09 AM

## 2019-11-13 NOTE — H&P (Signed)
Psychiatric Admission Assessment Adult  Patient Identification: Karen [name] MRN:  [id number] Date of Evaluation:  11/13/2019 Chief Complaint:  Bipolar 1 disorder ([place]) [F31.9] Principal Diagnosis: Bipolar 1 disorder ([place]) Diagnosis:  Principal Problem:   Bipolar 1 disorder ([place]) Active Problems:   Bipolar depression ([place])  History of Present Illness:   This is the second lifetime psychiatric admission (the first at age 29) for [name], she is 29 she is married she is employed as a Theatre manager and she has been diagnosed with a bipolar type condition.  She presented hoping for medication adjustments, as she reports the past week she had some manic episodes involving excessive spending, "self-defeating behaviors", poor sleep, then followed by more depression this week.  Her usual medication list include lamotrigine, Mirapex for irritability, and BuSpar.  She was unable to be seen yesterday by her regular clinicians and came in for further evaluation and made arranged with her husband to watch their son.  Past medication trials included Abilify and carbamazepine, she felt the carbamazepine made her more irritable and further caused alopecia and bruxism damaging her teeth.  She believes she was on lithium when she got pregnant and that of course had to be discontinued.  She has been on Latuda as well there was not particularly successful-  Current mental status exam is one of being alert, fully oriented cooperative and pleasant, endorsing passive suicidal thoughts without active plans or intent, denying any psychotic symptoms.  According to the nurse practitioner assessment of yesterday, [name] an 29 y.o.female who presents voluntarily to [facility] with complaints of passive suicidal ideation for the past few days. Pt reports she has been having worsening depression and anxiety with some passing thoughts to kill herself. States she had appointment with her PCP today for psychiatric  medication change due to ineffectiveness but was unable to get her request. Pt reports she also went to [facility] for the same reason but was told the earliest appointment was on 12/10/2019. Pt sates she feels helpless, worthless, anhedonia, irritable, and anxious. She states she sees a therapist Posey Rea and through [facility]. 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 She takes Lamictal, Buspar and Mirapex. She has had 1 inpatient  in 2013 at [place] for self cutting and overdose. Pt states she feels she is getting to the point where she was before her past hospitalization. She denies HI, AVH or self harm. Pt states she has lost 15lbs in the past 2 months. She gets about 4-8 hours of sleep. Pt states she does not know if she can contract for safety. Collateral was obtained from Pt's husband [name] [id number] per pt's request. Husband states that patient has been declining mentally and he is concerned for her safety and her ability to continue to care for their 16 month old son. Per husband, he is unable to contract for patient's safety.  During evaluation pt is sitting;she is alert/oriented x 4; anxious/cooperative; and mood depressed congruent with affect. Patient is speaking in a clear tone at moderate volume, and normal pace; with fair eye contact. Her thought process is coherent and relevant; There is no indication that she is currently responding to internal/external stimuli or experiencing delusional thought content.  Patient has answered questions appropriately.    Associated Signs/Symptoms: Depression Symptoms:  depressed mood, anhedonia, insomnia, fatigue, feelings of worthlessness/guilt, difficulty concentrating, hopelessness, suicidal thoughts without plan, anxiety, disturbed sleep,  decreased appetite, (Hypo) Manic Symptoms:  Impulsivity, Anxiety Symptoms:   Excessive Worry, Panic Symptoms, Psychotic Symptoms:  n/a PTSD Symptoms: NA Total Time spent with patient: 45 minutes  Past Psychiatric History: First admission at age 29 "or 29" at [facility] in which the diagnosis of bipolar type I was made.  Is the patient at risk to self? Yes.    Has the patient been a risk to self in the past 6 months? No.  Has the patient been a risk to self within the distant past? Yes.    Is the patient a risk to others? No.  Has the patient been a risk to others in the past 6 months? No.  Has the patient been a risk to others within the distant past? No.   Prior Inpatient Therapy: Prior Inpatient Therapy: Yes Prior Therapy Dates: 2013 Prior Therapy Facilty/Provider(s): [facility] Reason for Treatment: SI Prior Outpatient Therapy: Prior Outpatient Therapy: Yes Prior Therapy Dates: Since 2017 Prior Therapy Facilty/Provider(s): [facility] [facility] Reason for Treatment: med management & counseling Does patient have an ACCT team?: No Does patient have Intensive In-House Services?  : No Does patient have [facility] services? : No Does patient have P4CC services?: No  Alcohol Screening: 1. How often do you have a drink containing alcohol?: Never 2. How many drinks containing alcohol do you have on a typical day when you are drinking?: 1 or 2 3. How often do you have six or more drinks on one occasion?: Never AUDIT-C Score: 0 4. How often during the last year have you found that you were not able to stop drinking once you had started?: Never 5. How often during the last year have you failed to do what was normally expected from you becasue of drinking?: Never 6. How often during the last year have you needed a first drink in the morning to get yourself going after a heavy drinking session?: Never 7. How often during the last year have you had a feeling of guilt of remorse after drinking?: Never 8. How often during the last year have you been unable  to remember what happened the night before because you had been drinking?: Never 9. Have you or someone else been injured as a result of your drinking?: No 10. Has a relative or friend or a doctor or another health worker been concerned about your drinking or suggested you cut down?: No Alcohol Use Disorder Identification Test Final Score (AUDIT): 0 Substance Abuse History in the last 12 months:  No. Consequences of Substance Abuse: NA Previous Psychotropic Medications: Yes  Psychological Evaluations: No  Past Medical History:  Past Medical History:  Diagnosis Date  . Anxiety   . Bipolar disorder ([place])   . Chronic UTI   . Depression   . Endometriosis   . HSV (herpes simplex virus) anogenital infection     Past Surgical History:  Procedure Laterality Date  . CESAREAN SECTION N/A 02/17/2018   Procedure: CESAREAN SECTION;  Surgeon: Sanjuana Kava, MD;  Location: [place];  Service: Obstetrics;  Laterality: N/A;  . DIAGNOSTIC LAPAROSCOPY  2014  . DILATION AND EVACUATION N/A 03/11/2015   Procedure: DILATATION AND EVACUATION;  Surgeon: Waymon Amato, MD;  Location: [facility];  Service: Gynecology;  Laterality: N/A;  . TONSILLECTOMY  2015   Family History: History reviewed. No pertinent family history. Family Psychiatric  History: States she has both an uncle and an aunt who have bipolar disorder that have both also been diagnosed  as "progressing to schizoaffective" which the patient fears  Tobacco Screening:   Social History:  Social History   Substance and Sexual Activity  Alcohol Use No     Social History   Substance and Sexual Activity  Drug Use Yes  . Types: Heroin, Cocaine, Marijuana   Comment: Clean since Aug 2015    Additional Social History: Marital status: Married    Pain Medications: None Prescriptions: Mirapex .75mg  in PM; Lamictal 200mg  (using every other day to not run out); Buspar 15mg  2x/ D (ran out a month ago).  Got a refill this AM on Lamictal, Buspar and  Mirapex but has not gone to pharmacy yet. 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 Adhesive [Tape] Hives  . Ciprofloxacin Hives   Lab Results:  Results for orders placed or performed during the hospital encounter of 11/12/19 (from the past 48 hour(s))  Respiratory Panel by RT PCR (Flu A&B, Covid) - Nasopharyngeal Swab     Status: None   Collection Time: 11/12/19  9:37 PM   Specimen: Nasopharyngeal Swab  Result Value Ref Range   SARS Coronavirus 2 by RT PCR NEGATIVE NEGATIVE    Comment: (NOTE) SARS-CoV-2 target nucleic acids are NOT DETECTED. The SARS-CoV-2 RNA is generally detectable in upper respiratoy specimens during the acute phase of infection. The lowest concentration of SARS-CoV-2 viral copies this assay can detect is 131 copies/mL. A negative result does not preclude SARS-Cov-2 infection and should not be used as the sole basis for treatment or other patient management decisions. A negative result may occur with  improper specimen collection/handling, submission of specimen other than nasopharyngeal swab, presence of viral mutation(s) within the areas targeted by this assay, and inadequate number of viral copies (<131 copies/mL). A negative result must be combined with clinical observations, patient history, and epidemiological information. The expected result is Negative. Fact Sheet for Patients:  PinkCheek.be Fact Sheet for Healthcare Providers:  GravelBags.it This test is not yet ap proved or cleared by the [place] FDA and  has been authorized for detection and/or diagnosis of SARS-CoV-2 by FDA under an Emergency Use Authorization (EUA). This EUA will remain  in effect (meaning this test can be used) for  the duration of the COVID-19 declaration under Section 564(b)(1) of the Act, 21 U.S.C. section 360bbb-3(b)(1), unless the authorization is terminated or revoked sooner.    Influenza A by PCR NEGATIVE NEGATIVE   Influenza B by PCR NEGATIVE NEGATIVE    Comment: (NOTE) The Xpert Xpress SARS-CoV-2/FLU/RSV assay is intended as an aid in  the diagnosis of influenza from Nasopharyngeal swab specimens and  should not be used as a sole basis for treatment. Nasal washings and  aspirates are unacceptable for Xpert Xpress SARS-CoV-2/FLU/RSV  testing. Fact Sheet for Patients: PinkCheek.be Fact Sheet for Healthcare Providers: GravelBags.it This test is not yet approved or cleared by the [place] FDA and  has been authorized for detection and/or diagnosis of SARS-CoV-2 by  FDA under an Emergency Use Authorization (EUA). This EUA will remain  in effect (meaning this test can be used) for the duration of the  Covid-19 declaration under Section 564(b)(1) of the Act, 21  U.S.C. section 360bbb-3(b)(1), unless the  authorization is  terminated or revoked. Performed at [facility], [place] [address], [place], [place] [address]   Comprehensive metabolic panel     Status: Abnormal   Collection Time: 11/13/19  6:59 AM  Result Value Ref Range   Sodium 139 135 - 145 mmol/L   Potassium 4.1 3.5 - 5.1 mmol/L   Chloride 104 98 - 111 mmol/L   CO2 26 22 - 32 mmol/L   Glucose, Bld 107 (H) 70 - 99 mg/dL   BUN 11 6 - 20 mg/dL   Creatinine, Ser 0.71 0.44 - 1.00 mg/dL   Calcium 9.2 8.9 - 10.3 mg/dL   Total Protein 7.6 6.5 - 8.1 g/dL   Albumin 4.5 3.5 - 5.0 g/dL   AST 13 (L) 15 - 41 U/L   ALT 12 0 - 44 U/L   Alkaline Phosphatase 53 38 - 126 U/L   Total Bilirubin 0.5 0.3 - 1.2 mg/dL   GFR calc non Af Amer >60 >60 mL/min   GFR calc Af Amer >60 >60 mL/min   Anion gap 9 5 - 15    Comment: Performed at [facility],  [place] [address], [place], [place] [address]  Magnesium     Status: None   Collection Time: 11/13/19  6:59 AM  Result Value Ref Range   Magnesium 2.4 1.7 - 2.4 mg/dL    Comment: Performed at [facility], [place] [address], [place], [place] [address]  Ethanol     Status: None   Collection Time: 11/13/19  6:59 AM  Result Value Ref Range   Alcohol, Ethyl (B) <10 <10 mg/dL    Comment: (NOTE) Lowest detectable limit for serum alcohol is 10 mg/dL. For medical purposes only. Performed at [facility], [place] [address], [place], [place] [address]   Lipid panel     Status: Abnormal   Collection Time: 11/13/19  6:59 AM  Result Value Ref Range   Cholesterol 136 0 - 200 mg/dL   Triglycerides 35 <150 mg/dL   HDL 39 (L) >40 mg/dL   Total CHOL/HDL Ratio 3.5 RATIO   VLDL 7 0 - 40 mg/dL   LDL Cholesterol 90 0 - 99 mg/dL    Comment:        Total Cholesterol/HDL:CHD Risk Coronary Heart Disease Risk Table                     Men   Women  1/2 Average Risk   3.4   3.3  Average Risk       5.0   4.4  2 X Average Risk   9.6   7.1  3 X Average Risk  23.4   11.0        Use the calculated Patient Ratio above and the CHD Risk Table to determine the patient's CHD Risk.        ATP III CLASSIFICATION (LDL):  <100     mg/dL   Optimal  100-129  mg/dL   Near or Above                    Optimal  130-159  mg/dL   Borderline  160-189  mg/dL   High  >190     mg/dL   Very High Performed at [place] [address], [place], [place] [address]   Hepatic function panel     Status: Abnormal   Collection Time: 11/13/19  6:59 AM  Result Value Ref Range   Total Protein 7.5 6.5 - 8.1 g/dL   Albumin 4.7 3.5 - 5.0 g/dL   AST 12 (L) 15 - 41 U/L   ALT 14 0 - 44 U/L   Alkaline Phosphatase 54 38 - 126 U/L   Total Bilirubin 0.6 0.3 - 1.2 mg/dL   Bilirubin, Direct 0.1 0.0 - 0.2 mg/dL   Indirect Bilirubin 0.5 0.3 - 0.9 mg/dL    Comment: Performed at [facility], [place] [address], [place], [place] [address]  TSH     Status: None   Collection Time: 11/13/19  6:59 AM  Result Value Ref Range   TSH 0.616 0.350 - 4.500 uIU/mL    Comment: Performed by a 3rd Generation assay with a functional sensitivity of <=0.01 uIU/mL. Performed at [facility], [place] [address] [place], [place] [address]     Blood Alcohol level:  Lab Results  Component Value Date   ETH <10 99991111    Metabolic Disorder Labs:  No results found for: HGBA1C, MPG No results found for: PROLACTIN Lab Results  Component Value Date   CHOL 136 11/13/2019   TRIG 35 11/13/2019   HDL 39 (L) 11/13/2019   CHOLHDL 3.5 11/13/2019   VLDL 7 11/13/2019   LDLCALC 90 11/13/2019    Current Medications: Current Facility-Administered Medications  Medication Dose Route Frequency Provider Last Rate Last Admin  . acetaminophen (TYLENOL) tablet 650 mg  650 mg Oral Q6H PRN Anike, Adaku C, NP      . acetaminophen (TYLENOL) tablet 650 mg  650 mg Oral Q6H PRN Johnn Hai, MD      . alum & mag hydroxide-simeth (MAALOX/MYLANTA) 200-200-20 MG/5ML suspension 30 mL  30 mL Oral Q4H PRN Anike, Adaku C, NP      . alum & mag hydroxide-simeth (MAALOX/MYLANTA) 200-200-20 MG/5ML suspension 30 mL  30 mL Oral Q4H PRN Johnn Hai, MD      . brexpiprazole (REXULTI) tablet 0.5 mg  0.5 mg Oral BID Johnn Hai, MD      . FLUoxetine (PROZAC) capsule 20 mg  20 mg Oral Daily Johnn Hai, MD      . hydrOXYzine (ATARAX/VISTARIL) tablet 25 mg  25 mg Oral TID PRN Anike, Adaku C, NP      . lamoTRIgine (LAMICTAL) tablet 100 mg  100 mg Oral BID Johnn Hai, MD      . LORazepam (ATIVAN) tablet 1 mg  1 mg Oral Q4H PRN Johnn Hai, MD      . magnesium hydroxide (MILK OF MAGNESIA) suspension 30 mL  30 mL Oral Daily PRN Anike, Adaku C, NP      . magnesium hydroxide (MILK OF MAGNESIA) suspension 30 mL  30 mL Oral Daily PRN Johnn Hai, MD      . prenatal multivitamin tablet 1 tablet   1 tablet Oral Q1200 Johnn Hai, MD      . temazepam (RESTORIL) capsule 30 mg  30 mg Oral QHS Johnn Hai, MD      . traZODone (DESYREL) tablet 50 mg  50 mg Oral QHS PRN Anike, Adaku C, NP       PTA Medications: Medications Prior to Admission  Medication Sig Dispense Refill Last Dose  . busPIRone (BUSPAR) 15 MG tablet Take 1 tablet (15 mg total) by mouth 2 (two) times daily. 60 tablet 0   . lamoTRIgine (LAMICTAL) 200 MG tablet Take 2 tablets (400 mg total) by mouth every evening. 60 tablet 0   .  pramipexole (MIRAPEX) 0.75 MG tablet Take 0.75 mg by mouth daily.     . valACYclovir (VALTREX) 500 MG tablet Take 500 mg by mouth 2 (two) times daily as needed.       Musculoskeletal: Strength & Muscle Tone: within normal limits Gait & Station: normal Patient leans: N/A  Psychiatric Specialty Exam: Physical Exam  Nursing note and vitals reviewed. Constitutional: She appears well-developed and well-nourished.  Cardiovascular: Normal rate and regular rhythm.    Review of Systems  Constitutional: Negative.   HENT: Negative.   Eyes: Negative.   Cardiovascular: Negative.   Endocrine: Negative.   Genitourinary: Negative.   Allergic/Immunologic: Negative.   Neurological: Negative.     Blood pressure 101/66, pulse 85, temperature 97.9 F (36.6 C), temperature source Oral, resp. rate 16, height 5' 2.21" (1.58 m), weight 46 kg, last menstrual period 11/05/2019, SpO2 99 %, not currently breastfeeding.Body mass index is 18.43 kg/m.  General Appearance: Casual  Eye Contact:  Good  Speech:  Clear and Coherent  Volume:  Normal  Mood:  Anxious and Dysphoric  Affect:  Appropriate and Congruent  Thought Process:  Coherent and Descriptions of Associations: Intact  Orientation:  Full (Time, Place, and Person)  Thought Content:  Logical and Rumination  Suicidal Thoughts:  Yes.  without intent/plan  Homicidal Thoughts:  No  Memory:  Immediate;   Good Recent;   Good  Judgement:  Fair  Insight:   Good  Psychomotor Activity:  nl  Concentration:  Concentration: Fair and Attention Span: Fair  Recall:  [employer] of Knowledge:  Fair  Language:  Fair  Akathisia:  Negative  Handed:  Right  AIMS (if indicated):     Assets:  Communication Skills Desire for Improvement Housing Intimacy Leisure Time Physical Health Resilience Social Support Talents/Skills Transportation Vocational/Educational  ADL's:  Intact  Cognition:  WNL  Sleep:       Treatment Plan Summary: Daily contact with patient to assess and evaluate symptoms and progress in treatment and Medication management  Observation Level/Precautions:  15 minute checks  Laboratory:  UDS  Psychotherapy: Cognitive-based  Medications: See adjustments add Rexulti lower lamotrigine supplement folate  Consultations: None necessary  Discharge Concerns: Correct medication regimen  Estimated LOS: 5-7  Other:   Axis I bipolar depressed, recent manic symptoms last week/in need of inpatient stabilization Add low-dose Rexulti lower the dose of lamotrigine supplement folate add antidepressant monitor for worsening of mania Axis II deferred Axis III medically stable    Physician Treatment Plan for Primary Diagnosis: Bipolar 1 disorder ([place]) Long Term Goal(s): Improvement in symptoms so as ready for discharge  Short Term Goals: Ability to verbalize feelings will improve, Ability to disclose and discuss suicidal ideas, Ability to demonstrate self-control will improve, Ability to identify and develop effective coping behaviors will improve, Ability to maintain clinical measurements within normal limits will improve, Compliance with prescribed medications will improve and Ability to identify triggers associated with substance abuse/mental health issues will improve  Physician Treatment Plan for Secondary Diagnosis: Principal Problem:   Bipolar 1 disorder ([place]) Active Problems:   Bipolar depression ([place])  Long Term Goal(s): Improvement  in symptoms so as ready for discharge  Short Term Goals: Ability to identify changes in lifestyle to reduce recurrence of condition will improve, Ability to verbalize feelings will improve, Ability to disclose and discuss suicidal ideas, Ability to demonstrate self-control will improve, Ability to identify and develop effective coping behaviors will improve and Ability to maintain clinical measurements within normal limits  will improve  I certify that inpatient services furnished can reasonably be expected to improve the patient's condition.    Johnn Hai, MD 1/8/20219:02 AM

## 2019-11-13 NOTE — Tx Team (Signed)
Initial Treatment Plan 11/13/2019 1:52 PM Karen [name] [id number]    PATIENT STRESSORS: Financial difficulties Health problems Medication change or noncompliance   PATIENT STRENGTHS: Ability for insight Average or above average intelligence Communication skills Supportive family/friends   PATIENT IDENTIFIED PROBLEMS: Suicidal ideation  Depression  Anxiety                 DISCHARGE CRITERIA:  Ability to meet basic life and health needs Adequate post-discharge living arrangements Motivation to continue treatment in a less acute level of care  PRELIMINARY DISCHARGE PLAN: Attend aftercare/continuing care group Outpatient therapy Return to previous living arrangement  PATIENT/FAMILY INVOLVEMENT: This treatment plan has been presented to and reviewed with the patient, Karen [name], and/or family member.  The patient and family have been given the opportunity to ask questions and make suggestions.  Vela Prose, RN 11/13/2019, 1:52 PM
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
## 2019-11-13 NOTE — Progress Notes (Signed)
Patient is a 29 year old female transferred from the OBs unit- presents as friendly, yet extremely anxious and tearful at times, stating "I just want to be able to sleep and function normally". Pt denies SI/HI and A/V H  Admission paperwork completed and signed. Pt oriented to unit. Q 15 min checks initiated

## 2019-11-14 LAB — URINALYSIS, COMPLETE (UACMP) WITH MICROSCOPIC
Bilirubin Urine: NEGATIVE
Glucose, UA: NEGATIVE mg/dL
Hgb urine dipstick: NEGATIVE
Ketones, ur: NEGATIVE mg/dL
Leukocytes,Ua: NEGATIVE
Nitrite: NEGATIVE
Protein, ur: NEGATIVE mg/dL
Specific Gravity, Urine: 1.021 (ref 1.005–1.030)
pH: 7 (ref 5.0–8.0)

## 2019-11-14 LAB — RAPID URINE DRUG SCREEN, HOSP PERFORMED
Amphetamines: NOT DETECTED
Barbiturates: NOT DETECTED
Benzodiazepines: POSITIVE — AB
Cocaine: NOT DETECTED
Opiates: NOT DETECTED
Tetrahydrocannabinol: NOT DETECTED

## 2019-11-14 LAB — PREGNANCY, URINE: Preg Test, Ur: NEGATIVE

## 2019-11-14 MED ORDER — TRAZODONE HCL 50 MG PO TABS: 25.0000 mg | ORAL_TABLET | Freq: Every evening | ORAL | Status: DC | PRN

## 2019-11-14 MED ORDER — TEMAZEPAM 15 MG PO CAPS
15.0000 mg | ORAL_CAPSULE | Freq: Every day | ORAL | Status: DC
  Administered 2019-11-14 – 2019-11-15 (×2): 15 mg via ORAL
  Filled 2019-11-14 (×2): qty 1

## 2019-11-14 MED ORDER — NICOTINE POLACRILEX 2 MG MT GUM
2.0000 mg | CHEWING_GUM | OROMUCOSAL | Status: DC | PRN
  Administered 2019-11-14 – 2019-11-15 (×3): 2 mg via ORAL
  Filled 2019-11-14: qty 10

## 2019-11-14 MED ORDER — LORAZEPAM 0.5 MG PO TABS
0.5000 mg | ORAL_TABLET | ORAL | Status: DC | PRN
  Administered 2019-11-14 – 2019-11-15 (×3): 0.5 mg via ORAL
  Filled 2019-11-14 (×3): qty 1

## 2019-11-14 NOTE — B[facility] Group Notes (Signed)
[name] Group Therapy Note  11/14/2019   10:00-11:00am   Type of Therapy and Topic:  Group Therapy: Anger Cues and Responses  Participation Level:  Active   Description of Group:   In this group, patients learned how to recognize the physical, cognitive, emotional, and behavioral responses they have to anger-provoking situations.  They identified a recent time they became angry and how they reacted.  They analyzed how their reaction was possibly beneficial and how it was possibly unhelpful.  The group discussed a variety of healthier coping skills that could help with such a situation in the future.  Focus was placed on how helpful it is to recognize the underlying emotions to our anger, because working on those can lead to a more permanent solution as well as our ability to focus on the important rather than the urgent.  Therapeutic Goals: Patients will remember their last incident of anger and how they felt emotionally and physically, what their thoughts were at the time, and how they behaved. Patients will identify how their behavior at that time worked for them, as well as how it worked against them. Patients will explore possible new behaviors to use in future anger situations. Patients will learn that anger itself is normal and cannot be eliminated, and that healthier reactions can assist with resolving conflict rather than worsening situations.  Summary of Patient Progress:  The patient shared that her most recent time of anger was yesterday morning and said her psychiatrist she saw is "the worst psychiatrist ever who got angry because patient got a second opinion.  She went to [facility] to try to get help, and from there was told she could not see a psychiatric provider until Feb. 4.  She was very frustrated that nobody could help her.  She was also "pissed" that she has been clean for 6 years, but "did not get clean to be this miserable."  She was able to identify that she was embarrassed as one  underlying emotion.  She participated actively throughout group, spoke frequently and revealed a lot about herself.  Therapeutic Modalities:   Cognitive Behavioral Therapy  Maretta Los

## 2019-11-14 NOTE — Progress Notes (Signed)
Patient ID: Karen [name], female   DOB: [birth year], 29 y.o.   MRN: [id number]   D: Patient pleasant on approach tonight. Obtained EKG that was ordered. Patient reports increased depression and anxiety lately. Reports that she has not been sleeping. Refused Ensure when offered. Took two sleep aids tonight. No active SI. A: Staff will monitor on q 15 minute checks, follow treatment plan, and give medications as prescribed. R: Cooperative on the unit.

## 2019-11-14 NOTE — Progress Notes (Signed)
[facility] MD Progress Note  11/14/2019 3:58 PM Karen [name]  MRN:  [id number] Subjective: Patient reports "I guess I am feeling a little bit better".  Describes significant depression and anxiety leading up to admission.  Today denies suicidal ideations. Reports she felt excessively sedated in morning , which she attributes to being too sedated and having lingering AM effects from night time medications  Objective : I have reviewed chart notes and have met with patient. 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  Presented reporting worsening depression, fluctuating/labile  mood, significant anxiety including both anxious ruminations and intermittent panic symptoms. Endorses passive SI and neuro-vegetative symptoms, including poor appetite with unspecified weight loss, poor sleep, low energy level, anhedonia.  Reports she has been diagnosed with Bipolar Disorder and describes history of mood instability and episodes of increased energy/impulsivity.  Reports stressors related to parenting and a subjective sense of not taking enough care" of me, always being the support for everyone else" Denies drug or alcohol abuse - admission UDS positive for BZD, BAL negative.  Today patient reports partial improvement compared to how she felt prior to admission and states " I am better today" but describes persistent anxiety described as a vague but significant sense of apprehension and depression. She expresses anxiety that her improvement here on unit is because she is removed from her stressors and is unsure if symptoms will return after she is discharged.  Today presents with less mood lability, but still vaguely anxious and constricted. Not tearful today, and smiled briefly /appropriately during session. No disruptive or agitated behavior on unit, cooperative on approach. Labs reviewed - unremarkable. TSH 0.616 Was started on Rexulti, Prozac , Ativan PRN for anxiety. Thus far tolerating well  .As above, endorses feeling excessively sedated in AM following QHS administration of Temazepam.   Principal Problem: Bipolar 1 disorder (HCC) Diagnosis: Principal Problem:   Bipolar 1 disorder ([place]) Active Problems:   Bipolar depression ([place])  Total Time spent with patient: 20 minutes  Past Psychiatric History:   Past Medical History:  Past Medical History:  Diagnosis Date  . Anxiety   . Bipolar disorder ([place])   . Chronic UTI   . Depression   . Endometriosis   . HSV (herpes simplex virus) anogenital infection     Past Surgical History:  Procedure Laterality Date  . CESAREAN SECTION N/A 02/17/2018   Procedure: CESAREAN SECTION;  Surgeon: Sanjuana Kava, MD;  Location: [facility];  Service: Obstetrics;  Laterality: N/A;  . DIAGNOSTIC LAPAROSCOPY  2014  . DILATION AND EVACUATION N/A 03/11/2015   Procedure: DILATATION AND EVACUATION;  Surgeon: Waymon Amato, MD;  Location: [facility];  Service: Gynecology;  Laterality: N/A;  . TONSILLECTOMY  2015   Family History: History reviewed. No pertinent family history. Family Psychiatric  History:  Social History:  Social History   Substance and Sexual Activity  Alcohol Use No     Social History   Substance and Sexual Activity  Drug Use Yes  . Types: Heroin, Cocaine, Marijuana   Comment: Clean since Aug 2015    Social History   Socioeconomic History  . Marital status: Married    Spouse name: Not on file  . Number of children: Not on file  . Years of education: Not on file  . Highest education level: Not on file  Occupational History  . Not on file  Tobacco Use  . Smoking status: Former Smoker    Packs/day: 1.00  Types: Cigarettes    Quit date: 03/05/2017    Years since quitting: 2.6  . Smokeless tobacco: Current User  Substance and Sexual Activity  . Alcohol use: No  . Drug use: Yes    Types: Heroin, Cocaine, Marijuana    Comment: Clean since Aug 2015  . Sexual activity: Yes  Other Topics Concern  . Not on file   Social History Narrative  . Not on file   Social Determinants of Health   Financial Resource Strain:   . Difficulty of Paying Living Expenses: Not on file  Food Insecurity:   . Worried About Charity fundraiser in the Last Year: Not on file  . Ran Out of Food in the Last Year: Not on file  Transportation Needs:   . Lack of Transportation (Medical): Not on file  . Lack of Transportation (Non-Medical): Not on file  Physical Activity:   . Days of Exercise per Week: Not on file  . Minutes of Exercise per Session: Not on file  Stress:   . Feeling of Stress : Not on file  Social Connections:   . Frequency of Communication with Friends and Family: Not on file  . Frequency of Social Gatherings with Friends and Family: Not on file  . Attends Religious Services: Not on file  . Active Member of Clubs or Organizations: Not on file  . Attends Archivist Meetings: Not on file  . Marital Status: Not on file   Additional Social History:    Pain Medications: None Prescriptions: Mirapex .66m in PM; Lamictal 2066m(using every other day to not run out); Buspar 1540mx/ D (ran out a month ago).  Got a refill this AM on Lamictal, Buspar and Mirapex but has not gone to pharmacy yet. 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 acetaminophen (TYLENOL) tablet 650 mg  650 mg Oral Q6H PRN Anike, Adaku C, NP      . alum & mag hydroxide-simeth (MAALOX/MYLANTA) 200-200-20 MG/5ML suspension 30 mL  30 mL Oral Q4H PRN Anike, Adaku C, NP      . alum & mag hydroxide-simeth (MAALOX/MYLANTA) 200-200-20 MG/5ML suspension 30 mL  30 mL Oral Q4H PRN FarJohnn HaiD   30 mL at 11/14/19 0848  . brexpiprazole  (REXULTI) tablet 0.5 mg  0.5 mg Oral BID FarJohnn HaiD   0.5 mg at 11/14/19 0827048 feeding supplement (BOOST / RESOURCE BREEZE) liquid 1 Container  1 Container Oral BID BM Coyt Govoni A, MD      . feeding supplement (ENSURE ENLIVE) (ENSURE ENLIVE) liquid 237 mL  237 mL Oral Q24H Zayveon Raschke A, MD      . FLUoxetine (PROZAC) capsule 20 mg  20 mg Oral Daily FarJohnn HaiD   20 mg at 11/14/19 0828891 folic acid (FOLVITE) tablet 1 mg  1 mg Oral Daily FarJohnn HaiD   1 mg at 11/14/19 [id number] lamoTRIgine (LAMICTAL) tablet 100 mg  100 mg Oral BID FarJohnn HaiD   100 mg at 11/14/19 [id number] LORazepam (ATIVAN) tablet 0.5 mg  0.5 mg Oral Q4H PRN Kaylie Ritter, FerMyer PeerD      . magnesium hydroxide (  MILK OF MAGNESIA) suspension 30 mL  30 mL Oral Daily PRN Anike, Adaku C, NP      . magnesium hydroxide (MILK OF MAGNESIA) suspension 30 mL  30 mL Oral Daily PRN Johnn Hai, MD      . nicotine polacrilex (NICORETTE) gum 2 mg  2 mg Oral PRN Kawthar Ennen, Myer Peer, MD   2 mg at 11/14/19 1551  . prenatal multivitamin tablet 1 tablet  1 tablet Oral Q1200 Johnn Hai, MD   1 tablet at 11/14/19 1224  . temazepam (RESTORIL) capsule 15 mg  15 mg Oral QHS Valori Hollenkamp, Myer Peer, MD      . traZODone (DESYREL) tablet 25 mg  25 mg Oral QHS PRN Jessicamarie Amiri, Myer Peer, MD        Lab Results:  Results for orders placed or performed during the hospital encounter of 11/12/19 (from the past 48 hour(s))  Respiratory Panel by RT PCR (Flu A&B, Covid) - Nasopharyngeal Swab     Status: None   Collection Time: 11/12/19  9:37 PM   Specimen: Nasopharyngeal Swab  Result Value Ref Range   SARS Coronavirus 2 by RT PCR NEGATIVE NEGATIVE    Comment: (NOTE) SARS-CoV-2 target nucleic acids are NOT DETECTED. The SARS-CoV-2 RNA is generally detectable in upper respiratoy specimens during the acute phase of infection. The lowest concentration of SARS-CoV-2 viral copies this assay can detect is 131 copies/mL. A negative result does not  preclude SARS-Cov-2 infection and should not be used as the sole basis for treatment or other patient management decisions. A negative result may occur with  improper specimen collection/handling, submission of specimen other than nasopharyngeal swab, presence of viral mutation(s) within the areas targeted by this assay, and inadequate number of viral copies (<131 copies/mL). A negative result must be combined with clinical observations, patient history, and epidemiological information. The expected result is Negative. Fact Sheet for Patients:  PinkCheek.be Fact Sheet for Healthcare Providers:  GravelBags.it This test is not yet ap proved or cleared by the [place] FDA and  has been authorized for detection and/or diagnosis of SARS-CoV-2 by FDA under an Emergency Use Authorization (EUA). This EUA will remain  in effect (meaning this test can be used) for the duration of the COVID-19 declaration under Section 564(b)(1) of the Act, 21 U.S.C. section 360bbb-3(b)(1), unless the authorization is terminated or revoked sooner.    Influenza A by PCR NEGATIVE NEGATIVE   Influenza B by PCR NEGATIVE NEGATIVE    Comment: (NOTE) The Xpert Xpress SARS-CoV-2/FLU/RSV assay is intended as an aid in  the diagnosis of influenza from Nasopharyngeal swab specimens and  should not be used as a sole basis for treatment. Nasal washings and  aspirates are unacceptable for Xpert Xpress SARS-CoV-2/FLU/RSV  testing. Fact Sheet for Patients: PinkCheek.be Fact Sheet for Healthcare Providers: GravelBags.it This test is not yet approved or cleared by the [place] FDA and  has been authorized for detection and/or diagnosis of SARS-CoV-2 by  FDA under an Emergency Use Authorization (EUA). This EUA will remain  in effect (meaning this test can be used) for the duration of the  Covid-19  declaration under Section 564(b)(1) of the Act, 21  U.S.C. section 360bbb-3(b)(1), unless the authorization is  terminated or revoked. Performed at [facility], [place] [address], [place], [place] [address]   Urinalysis, Complete w Microscopic     Status: Abnormal   Collection Time: 11/12/19 11:03 PM  Result Value Ref Range   Color, Urine YELLOW YELLOW  APPearance HAZY (A) CLEAR   Specific Gravity, Urine 1.021 1.005 - 1.030   pH 7.0 5.0 - 8.0   Glucose, UA NEGATIVE NEGATIVE mg/dL   Hgb urine dipstick NEGATIVE NEGATIVE   Bilirubin Urine NEGATIVE NEGATIVE   Ketones, ur NEGATIVE NEGATIVE mg/dL   Protein, ur NEGATIVE NEGATIVE mg/dL   Nitrite NEGATIVE NEGATIVE   Leukocytes,Ua NEGATIVE NEGATIVE   RBC / HPF 0-5 0 - 5 RBC/hpf   WBC, UA 0-5 0 - 5 WBC/hpf   Bacteria, UA RARE (A) NONE SEEN   Squamous Epithelial / LPF 0-5 0 - 5   Mucus PRESENT     Comment: Performed at [facility], [place] [address] [place], [place] [address]  Urine rapid drug screen (hosp performed)not at [facility]     Status: Abnormal   Collection Time: 11/12/19 11:03 PM  Result Value Ref Range   Opiates NONE DETECTED NONE DETECTED   Cocaine NONE DETECTED NONE DETECTED   Benzodiazepines POSITIVE (A) NONE DETECTED   Amphetamines NONE DETECTED NONE DETECTED   Tetrahydrocannabinol NONE DETECTED NONE DETECTED   Barbiturates NONE DETECTED NONE DETECTED    Comment: (NOTE) DRUG SCREEN FOR MEDICAL PURPOSES ONLY.  IF CONFIRMATION IS NEEDED FOR ANY PURPOSE, NOTIFY LAB WITHIN 5 DAYS. LOWEST DETECTABLE LIMITS FOR URINE DRUG SCREEN Drug Class                     Cutoff (ng/mL) Amphetamine and metabolites    1000 Barbiturate and metabolites    200 Benzodiazepine                 604 Tricyclics and metabolites     300 Opiates and metabolites        300 Cocaine and metabolites        300 THC                            50 Performed at [facility], [place] [address], [place], [place] [address]   Comprehensive metabolic panel     Status: Abnormal   Collection Time: 11/13/19  6:59 AM  Result Value Ref Range   Sodium 139 135 - 145 mmol/L   Potassium 4.1 3.5 - 5.1 mmol/L   Chloride 104 98 - 111 mmol/L   CO2 26 22 - 32 mmol/L   Glucose, Bld 107 (H) 70 - 99 mg/dL   BUN 11 6 - 20 mg/dL   Creatinine, Ser 0.71 0.44 - 1.00 mg/dL   Calcium 9.2 8.9 - 10.3 mg/dL   Total Protein 7.6 6.5 - 8.1 g/dL   Albumin 4.5 3.5 - 5.0 g/dL   AST 13 (L) 15 - 41 U/L   ALT 12 0 - 44 U/L   Alkaline Phosphatase 53 38 - 126 U/L   Total Bilirubin 0.5 0.3 - 1.2 mg/dL   GFR calc non Af Amer >60 >60 mL/min   GFR calc Af Amer >60 >60 mL/min   Anion gap 9 5 - 15    Comment: Performed at [facility], [place] [address], [place], [place] [address]  Magnesium     Status: None   Collection Time: 11/13/19  6:59 AM  Result Value Ref Range   Magnesium 2.4 1.7 - 2.4 mg/dL    Comment: Performed at [facility], [place] [address] [place], [place] [address]  Ethanol     Status: None   Collection Time: 11/13/19  6:59  AM  Result Value Ref Range   Alcohol, Ethyl (B) <10 <10 mg/dL    Comment: (NOTE) Lowest detectable limit for serum alcohol is 10 mg/dL. For medical purposes only. Performed at [facility], [place] [address] [place], [place] [address]   Lipid panel     Status: Abnormal   Collection Time: 11/13/19  6:59 AM  Result Value Ref Range   Cholesterol 136 0 - 200 mg/dL   Triglycerides 35 <150 mg/dL   HDL 39 (L) >40 mg/dL   Total CHOL/HDL Ratio 3.5 RATIO   VLDL 7 0 - 40 mg/dL   LDL Cholesterol 90 0 - 99 mg/dL    Comment:        Total Cholesterol/HDL:CHD Risk Coronary Heart Disease Risk Table                     Men   Women  1/2 Average Risk   3.4   3.3  Average Risk       5.0   4.4  2 X Average Risk   9.6   7.1  3 X Average Risk  23.4   11.0        Use the calculated Patient Ratio above and the CHD Risk Table to  determine the patient's CHD Risk.        ATP III CLASSIFICATION (LDL):  <100     mg/dL   Optimal  100-129  mg/dL   Near or Above                    Optimal  130-159  mg/dL   Borderline  160-189  mg/dL   High  >190     mg/dL   Very High Performed at [place] [address] [place], [place] [address]   Hepatic function panel     Status: Abnormal   Collection Time: 11/13/19  6:59 AM  Result Value Ref Range   Total Protein 7.5 6.5 - 8.1 g/dL   Albumin 4.7 3.5 - 5.0 g/dL   AST 12 (L) 15 - 41 U/L   ALT 14 0 - 44 U/L   Alkaline Phosphatase 54 38 - 126 U/L   Total Bilirubin 0.6 0.3 - 1.2 mg/dL   Bilirubin, Direct 0.1 0.0 - 0.2 mg/dL   Indirect Bilirubin 0.5 0.3 - 0.9 mg/dL    Comment: Performed at [facility], [place] [address] [place], [place] [address]  TSH     Status: None   Collection Time: 11/13/19  6:59 AM  Result Value Ref Range   TSH 0.616 0.350 - 4.500 uIU/mL    Comment: Performed by a 3rd Generation assay with a functional sensitivity of <=0.01 uIU/mL. Performed at [facility], [place] [address] [place], [place] [address]     Blood Alcohol level:  Lab Results  Component Value Date   ETH <10 56/31/4970    Metabolic Disorder Labs: No results found for: HGBA1C, MPG No results found for: PROLACTIN Lab Results  Component Value Date   CHOL 136 11/13/2019   TRIG 35 11/13/2019   HDL 39 (L) 11/13/2019   CHOLHDL 3.5 11/13/2019   VLDL 7 11/13/2019   LDLCALC 90 11/13/2019    Physical Findings: AIMS: Facial and Oral Movements Muscles of Facial Expression: None, normal Lips and Perioral Area: None, normal Jaw: None, normal Tongue: None, normal,Extremity Movements Upper (arms, wrists, hands, fingers): None, normal Lower (legs, knees, ankles, toes): None, normal, Trunk  Movements Neck, shoulders, hips: None, normal, Overall Severity Severity of abnormal movements (highest score from questions above): None,  normal Incapacitation due to abnormal movements: None, normal Patient's awareness of abnormal movements (rate only patient's report): No Awareness, Dental Status Current problems with teeth and/or dentures?: No Does patient usually wear dentures?: No  CIWA:    COWS:     Musculoskeletal: Strength & Muscle Tone: within normal limits no current psychomotor agitation or restlessness  Gait & Station: normal Patient leans: N/A  Psychiatric Specialty Exam: Physical Exam  Review of Systems denies chest pain, no shortness of breath, no vomiting , no fever or chills , no rash  Blood pressure 101/63, pulse 88, temperature 97.8 F (36.6 C), temperature source Oral, resp. rate 16, height 5' 2.21" (1.58 m), weight 46 kg, last menstrual period 11/05/2019, SpO2 99 %, not currently breastfeeding.Body mass index is 18.43 kg/m.  General Appearance: Fairly Groomed  Eye Contact:  Good  Speech:  Normal Rate  Volume:  Decreased  Mood:  Anxious and Depressed- states feeling partially better today  Affect:  labile, improves partially during session  Thought Process:  Linear and Descriptions of Associations: Intact  Orientation:  Full (Time, Place, and Person)  Thought Content:  no hallucinations, no delusions, not internally preoccupied   Suicidal Thoughts:  No today denies suicidal or self injurious ideations and contracts for safety at this time, denies homicidal or violent ideations  Homicidal Thoughts:  No  Memory:  recent and remote grossly intac t  Judgement:  Fair/ improving  Insight:  Fair/ improving  Psychomotor Activity:  no current psychomotor agitation or restlessness   Concentration:  Concentration: improving  and Attention Span: improving  Recall:  Good  Fund of Knowledge:  Good  Language:  Good  Akathisia:  Negative  Handed:  Right  AIMS (if indicated):     Assets:  Desire for Improvement Resilience  ADL's:  Intact  Cognition:  WNL  Sleep:  Number of Hours: 5.75   Assessment -   29 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  Presented reporting worsening depression, fluctuating/labile  mood, significant anxiety including both anxious ruminations and intermittent panic symptoms. Endorses passive SI and neuro-vegetative symptoms, including poor appetite with unspecified weight loss, poor sleep, low energy level, anhedonia.  Reports she has been diagnosed with Bipolar Disorder and describes history of mood instability and episodes of increased energy/impulsivity.  Reports stressors related to parenting and a subjective sense of not taking enough care" of me, always being the support for everyone else"  Patient reports partial improvement compared to how she felt at admission, but remains anxious, depressed and vaguely labile in affect, although noted to be more reactive today. Denies suicidal ideations at this time and contracts for safety. No psychotic symptoms. Tolerating medications ( Rexulti, Lamictal, Prozac) well thus far, but reports feeling excessively sedated in AM following Temazepam QHS administration. Does state she slept better. Currently presents fully alert and attentive .  Treatment Plan Summary: Daily contact with patient to assess and evaluate symptoms and progress in treatment, Medication management, Plan inpatient treatment and medications as below Encourage group and milieu participation to work on coping skills and symptom reduction Decrease Temazepam to 15 mgrs QHS for insomnia Decrease Trazodone to 25 mgrs QHS PRN for insomnia if needed  Continue Lamictal 100 mgrs BID for mood disorder Continue Rexulti 0.5 mgrs BID for mood disorder Continue Prozac 20 mgrs QDAY for depression, anxiety Continue Ativan 0.5 mgrs Q 6  hours PRN for anxiety as needed  Will order pregnancy test ( routine)  Treatment team working on disposition planning options  Jenne Campus, MD 11/14/2019, 3:58 PM

## 2019-11-14 NOTE — Progress Notes (Signed)
   11/14/19 1300  Psych Admission Type (Psych Patients Only)  Admission Status Voluntary  Psychosocial Assessment  Patient Complaints Anxiety  Eye Contact Fair  Facial Expression Flat  Affect Appropriate to circumstance  Speech Logical/coherent  Interaction Assertive  Motor Activity Other (Comment) (wdl)  Appearance/Hygiene Unremarkable  Behavior Characteristics Cooperative;Appropriate to situation  Mood Anxious;Pleasant  Thought Process  Coherency WDL  Content WDL  Delusions WDL  Perception WDL  Hallucination None reported or observed  Judgment WDL  Confusion WDL  Danger to Self  Current suicidal ideation? Denies  Danger to Others  Danger to Others None reported or observed

## 2019-11-14 NOTE — Progress Notes (Signed)
[facility] Notes:  (Nursing/MHT/Case Management/Adjunct)  Date:  11/14/2019  Time: 2030  Type of Therapy:  wrap up group  Participation Level:  Active  Participation Quality:  Appropriate, Sharing and Supportive  Affect:  Blunted and Depressed  Cognitive:  Appropriate  Insight:  Improving  Engagement in Group:  Engaged  Modes of Intervention:  Clarification, Education and Support   Summary of Progress/Problems: Pt shared that she had a good visit with her sponsor this evening. Pt has plans to start seeing a therapist on a regular schedule again . Pt reports social worker making an appointment with a new therapist for her. Pt his grateful for her two year old child.   Karen [name] 11/14/2019, 9:31 PM

## 2019-11-15 NOTE — Progress Notes (Signed)
[facility] MD Progress Note  11/15/2019 5:39 PM Karen [name]  MRN:  [id number] Subjective: Patient reports she is feeling better today.  Denies suicidal ideations.  Currently hoping for discharge soon. Objective : I have reviewed chart notes and have met with patient. 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  Presented reporting worsening depression, fluctuating/labile  mood, significant anxiety including both anxious ruminations and intermittent panic symptoms. Endorses passive SI and neuro-vegetative symptoms, including poor appetite with unspecified weight loss, poor sleep, low energy level, anhedonia.  Reports she has been diagnosed with Bipolar Disorder and describes history of mood instability and episodes of increased energy/impulsivity.  Reports stressors related to parenting and a subjective sense of not taking enough care" of me, always being the support for everyone else"   Patient reported feeling better than on admission.  Described improving mood and decreased anxiety levels.  As she improves she is becoming more future oriented and hopeful for discharge soon. She describes improving appetite. Denies suicidal ideations. Currently denies medication side effects. Nursing staff reports she presented with panic symptoms this a.m., which responded gradually to verbal reassurance and support from staff. Labs reviewed-pregnancy test negative    Principal Problem: Bipolar 1 disorder (HCC) Diagnosis: Principal Problem:   Bipolar 1 disorder ([place]) Active Problems:   Bipolar depression ([facility])  Total Time spent with patient: 15 minutes  Past Psychiatric History:   Past Medical History:  Past Medical History:  Diagnosis Date  . Anxiety   . Bipolar disorder ([place])   . Chronic UTI   . Depression   . Endometriosis   . HSV (herpes simplex virus) anogenital infection     Past Surgical History:  Procedure Laterality Date  . CESAREAN SECTION N/A 02/17/2018    Procedure: CESAREAN SECTION;  Surgeon: Sanjuana Kava, MD;  Location: [facility];  Service: Obstetrics;  Laterality: N/A;  . DIAGNOSTIC LAPAROSCOPY  2014  . DILATION AND EVACUATION N/A 03/11/2015   Procedure: DILATATION AND EVACUATION;  Surgeon: Waymon Amato, MD;  Location: [facility];  Service: Gynecology;  Laterality: N/A;  . TONSILLECTOMY  2015   Family History: History reviewed. No pertinent family history. Family Psychiatric  History:  Social History:  Social History   Substance and Sexual Activity  Alcohol Use No     Social History   Substance and Sexual Activity  Drug Use Yes  . Types: Heroin, Cocaine, Marijuana   Comment: Clean since Aug 2015    Social History   Socioeconomic History  . Marital status: Married    Spouse name: Not on file  . Number of children: Not on file  . Years of education: Not on file  . Highest education level: Not on file  Occupational History  . Not on file  Tobacco Use  . Smoking status: Former Smoker    Packs/day: 1.00    Types: Cigarettes    Quit date: 03/05/2017    Years since quitting: 2.6  . Smokeless tobacco: Current User  Substance and Sexual Activity  . Alcohol use: No  . Drug use: Yes    Types: Heroin, Cocaine, Marijuana    Comment: Clean since Aug 2015  . Sexual activity: Yes  Other Topics Concern  . Not on file  Social History Narrative  . Not on file   Social Determinants of Health   Financial Resource Strain:   . Difficulty of Paying Living Expenses: Not on file  Food Insecurity:   . Worried About [employer] of  Food in the Last Year: Not on file  . Ran Out of Food in the Last Year: Not on file  Transportation Needs:   . Lack of Transportation (Medical): Not on file  . Lack of Transportation (Non-Medical): Not on file  Physical Activity:   . Days of Exercise per Week: Not on file  . Minutes of Exercise per Session: Not on file  Stress:   . Feeling of Stress : Not on file  Social Connections:   . Frequency of  Communication with Friends and Family: Not on file  . Frequency of Social Gatherings with Friends and Family: Not on file  . Attends Religious Services: Not on file  . Active Member of Clubs or Organizations: Not on file  . Attends Archivist Meetings: Not on file  . Marital Status: Not on file   Additional Social History:    Pain Medications: None Prescriptions: Mirapex .83m in PM; Lamictal 2036m(using every other day to not run out); Buspar 1538mx/ D (ran out a month ago).  Got a refill this AM on Lamictal, Buspar and Mirapex but has not gone to pharmacy yet. 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 acetaminophen (TYLENOL) tablet 650 mg  650 mg Oral Q6H PRN Anike, Adaku C, NP      . alum & mag hydroxide-simeth (MAALOX/MYLANTA) 200-200-20 MG/5ML suspension 30 mL  30 mL Oral Q4H PRN Anike, Adaku C, NP      . alum & mag hydroxide-simeth (MAALOX/MYLANTA) 200-200-20 MG/5ML suspension 30 mL  30 mL Oral Q4H PRN FarJohnn HaiD   30 mL at 11/14/19 0848  . brexpiprazole (REXULTI) tablet 0.5 mg  0.5 mg Oral BID FarJohnn HaiD   0.5 mg at 11/15/19 0819  . feeding supplement (BOOST / RESOURCE BREEZE) liquid 1 Container  1 Container Oral BID BM Makeisha Jentsch A, MD      . feeding supplement (ENSURE ENLIVE) (ENSURE ENLIVE) liquid 237 mL  237 mL Oral Q24H Adonai Helzer A, MD      . FLUoxetine (PROZAC) capsule 20 mg  20 mg Oral Daily FarJohnn HaiD   20 mg at 11/15/19 0754  . folic acid (FOLVITE) tablet 1 mg  1 mg Oral Daily FarJohnn HaiD   1 mg at 11/15/19 0754  . lamoTRIgine (LAMICTAL) tablet 100 mg  100 mg Oral BID FarJohnn HaiD   100 mg at 11/15/19 1700  . LORazepam (ATIVAN)  tablet 0.5 mg  0.5 mg Oral Q4H PRN Sharaya Boruff, FerMyer PeerD   0.5 mg at 11/15/19 1124  . magnesium hydroxide (MILK OF MAGNESIA) suspension 30 mL  30 mL Oral Daily PRN Anike, Adaku C, NP      . magnesium hydroxide (MILK OF MAGNESIA) suspension 30 mL  30 mL Oral Daily PRN FarJohnn HaiD      . nicotine polacrilex (NICORETTE) gum 2 mg  2 mg Oral PRN Koben Daman, FerMyer PeerD   2 mg at 11/15/19 1234  . prenatal multivitamin tablet 1 tablet  1 tablet Oral Q1200 FarJohnn HaiD   1 tablet at 11/15/19 1204  . temazepam (RESTORIL) capsule 15 mg  15 mg Oral QHS Breawna Montenegro, FerMyer PeerD  15 mg at 11/14/19 2108  . traZODone (DESYREL) tablet 25 mg  25 mg Oral QHS PRN Carmel Garfield, Myer Peer, MD        Lab Results:  Results for orders placed or performed during the hospital encounter of 11/12/19 (from the past 48 hour(s))  Pregnancy, urine     Status: None   Collection Time: 11/14/19  4:23 PM  Result Value Ref Range   Preg Test, Ur NEGATIVE NEGATIVE    Comment:        THE SENSITIVITY OF THIS METHODOLOGY IS >20 mIU/mL. Performed at [facility], [place] [address], [place], [place] [address]     Blood Alcohol level:  Lab Results  Component Value Date   ETH <10 82/95/6213    Metabolic Disorder Labs: No results found for: HGBA1C, MPG No results found for: PROLACTIN Lab Results  Component Value Date   CHOL 136 11/13/2019   TRIG 35 11/13/2019   HDL 39 (L) 11/13/2019   CHOLHDL 3.5 11/13/2019   VLDL 7 11/13/2019   LDLCALC 90 11/13/2019    Physical Findings: AIMS: Facial and Oral Movements Muscles of Facial Expression: None, normal Lips and Perioral Area: None, normal Jaw: None, normal Tongue: None, normal,Extremity Movements Upper (arms, wrists, hands, fingers): None, normal Lower (legs, knees, ankles, toes): None, normal, Trunk Movements Neck, shoulders, hips: None, normal, Overall Severity Severity of abnormal movements (highest score from questions above): None,  normal Incapacitation due to abnormal movements: None, normal Patient's awareness of abnormal movements (rate only patient's report): No Awareness, Dental Status Current problems with teeth and/or dentures?: No Does patient usually wear dentures?: No  CIWA:  CIWA-Ar Total: 1 COWS:     Musculoskeletal: Strength & Muscle Tone: within normal limits no current psychomotor agitation or restlessness  Gait & Station: normal Patient leans: N/A  Psychiatric Specialty Exam: Physical Exam  Review of Systems denies chest pain, no shortness of breath, no vomiting , no fever or chills , no rash  Blood pressure 92/65, pulse 85, temperature 97.8 F (36.6 C), temperature source Oral, resp. rate 16, height 5' 2.21" (1.58 m), weight 46 kg, last menstrual period 11/05/2019, SpO2 99 %, not currently breastfeeding.Body mass index is 18.43 kg/m.  General Appearance: Improved grooming  Eye Contact:  Good  Speech:  Normal Rate  Volume:  Normal  Mood:  Reports she is feeling better-describes improved mood  Affect:  Improved range of affect, remains vaguely anxious  Thought Process:  Linear and Descriptions of Associations: Intact  Orientation:  Full (Time, Place, and Person)  Thought Content:  no hallucinations, no delusions, not internally preoccupied   Suicidal Thoughts:  No today denies suicidal or self injurious ideations and contracts for safety at this time, denies homicidal or violent ideations  Homicidal Thoughts:  No  Memory:  recent and remote grossly intac t  Judgement:   improving  Insight:  Fair/ improving  Psychomotor Activity: Normal, no psychomotor agitation  Concentration:  Concentration: improving  and Attention Span: improving  Recall:  Good  Fund of Knowledge:  Good  Language:  Good  Akathisia:  Negative  Handed:  Right  AIMS (if indicated):     Assets:  Desire for Improvement Resilience  ADL's:  Intact  Cognition:  WNL  Sleep:  Number of Hours: 6.25   Assessment -  29 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  Presented reporting worsening depression, fluctuating/labile  mood, significant anxiety including both anxious ruminations and intermittent panic symptoms.  Endorses passive SI and neuro-vegetative symptoms, including poor appetite with unspecified weight loss, poor sleep, low energy level, anhedonia.  Reports she has been diagnosed with Bipolar Disorder and describes history of mood instability and episodes of increased energy/impulsivity.  Reports stressors related to parenting and a subjective sense of not taking enough care" of me, always being the support for everyone else"  Patient reports improvement compared to admission with improving mood and anxiety symptoms.  Staff reports she did have panic symptoms earlier today which she responded to verbal support and reassurance/deep breathing.  Currently she is future oriented and hopeful for discharge soon.  Denies medication side effects at this time.  Treatment Plan Summary: Daily contact with patient to assess and evaluate symptoms and progress in treatment, Medication management, Plan inpatient treatment and medications as below  Treatment plan reviewed as below today 1/10 Encourage group and milieu participation to work on coping skills and symptom reduction Continue Temazepam  15 mgrs QHS for insomnia Continue Trazodone  25 mgrs QHS PRN for insomnia if needed  Continue Lamictal 100 mgrs BID for mood disorder Continue Rexulti 0.5 mgrs BID for mood disorder Continue Prozac 20 mgrs QDAY for depression, anxiety Continue Ativan 0.5 mgrs Q 6 hours PRN for anxiety as needed  Treatment team working on disposition planning options  Jenne Campus, MD 11/15/2019, 5:39 PM   Patient ID: Karen [name], female   DOB: 01/20/1991, 29 y.o.   MRN: [id number]

## 2019-11-15 NOTE — Progress Notes (Addendum)
Pt reported decreased depression today. Pt rated depression 0/10. Pt reported having lots of anxiety this morning regarding her plans for discharge. Pt rated anxiety 6/10. Pt denied SI/HI. Pt reported fair sleep last night.   11/15/19 0800  Psych Admission Type (Psych Patients Only)  Admission Status Voluntary  Psychosocial Assessment  Patient Complaints Anxiety  Eye Contact Fair  Facial Expression Flat  Affect Appropriate to circumstance  Speech Logical/coherent  Interaction Assertive  Motor Activity Fidgety  Appearance/Hygiene Unremarkable  Behavior Characteristics Appropriate to situation  Mood Anxious  Aggressive Behavior  Effect No apparent injury  Thought Process  Coherency WDL  Content WDL  Delusions None reported or observed  Perception WDL  Hallucination None reported or observed  Judgment WDL  Confusion None  Danger to Self  Current suicidal ideation? Denies  Danger to Others  Danger to Others None reported or observed      11/15/19 0800  Psych Admission Type (Psych Patients Only)  Admission Status Voluntary  Psychosocial Assessment  Patient Complaints Anxiety  Eye Contact Fair  Facial Expression Flat  Affect Appropriate to circumstance  Speech Logical/coherent  Interaction Assertive  Motor Activity Fidgety  Appearance/Hygiene Unremarkable  Behavior Characteristics Appropriate to situation  Mood Anxious  Aggressive Behavior  Effect No apparent injury  Thought Process  Coherency WDL  Content WDL  Delusions None reported or observed  Perception WDL  Hallucination None reported or observed  Judgment WDL  Confusion None  Danger to Self  Current suicidal ideation? Denies  Danger to Others  Danger to Others None reported or observed

## 2019-11-15 NOTE — [facility] Group Notes (Signed)
[facility] [name] Group Therapy Note  11/15/2019  10:00-11:00AM  Type of Therapy and Topic:  Group Therapy:  A Hero Worthy of Support  Participation Level:  Active   Description of Group:  Patients in this group were introduced to the concept that additional supports including self-support are an essential part of recovery.  Matching needs with supports to help fulfill those needs was explained.  Establishing boundaries that can gradually be increased or decreased was described, with patients giving their own examples of establishing appropriate boundaries in their lives.  A song entitled "My Own Hero" was played and a group discussion ensued in which patients stated it inspired them to help themselves in order to succeed, because other people cannot achieve their goals such as sobriety or stability for them.  A song was played called "I Am Enough" which led to a discussion about being willing to believe we are worth the effort of being a self-support.   Therapeutic Goals: 1)  demonstrate the importance of being a key part of one's own support system 2)  discuss various available supports 3)  encourage patient to use music as part of their self-support and focus on goals 4)  elicit ideas from patients about supports that need to be added   Summary of Patient Progress:  The patient expressed that her husband, 2 best friends, and mother-in-law are all healthy for her.  She has put up firm boundaries with a toxic friend recently.  She stated her mother has mental health struggles of her own so is often not able to be supportive, while her father does not understand mental health issues and minimizes them.  Therapeutic Modalities:   Motivational Interviewing Activity  Maretta Los

## 2019-11-15 NOTE — Progress Notes (Signed)
[facility] Group Notes: (Nursing/MHT/Case Management/Adjunct)  Date:11/15/2019 Time:1300  Type of Therapy:Nurse Educationdiscussed goals for hospitalization  Participation Level:Did Not Attend

## 2019-11-15 NOTE — Progress Notes (Signed)
Pt did attend the evening wrap up group. Pt was attentive and sharing. Positive thinking and positive change were discussed.

## 2019-11-16 MED ORDER — LORAZEPAM 0.5 MG PO TABS: 0.5000 mg | ORAL_TABLET | ORAL | 0 refills | Status: DC | PRN

## 2019-11-16 MED ORDER — TEMAZEPAM 15 MG PO CAPS: 15.0000 mg | ORAL_CAPSULE | Freq: Every day | ORAL | 0 refills | Status: DC

## 2019-11-16 MED ORDER — BREXPIPRAZOLE 0.5 MG PO TABS: 0.5000 mg | ORAL_TABLET | Freq: Two times a day (BID) | ORAL | 0 refills | Status: DC

## 2019-11-16 MED ORDER — NICOTINE POLACRILEX 2 MG MT GUM: 2.0000 mg | CHEWING_GUM | OROMUCOSAL | 0 refills | Status: DC | PRN

## 2019-11-16 MED ORDER — FLUOXETINE HCL 20 MG PO CAPS: 20.0000 mg | ORAL_CAPSULE | Freq: Every day | ORAL | 0 refills | Status: DC

## 2019-11-16 MED ORDER — PRENATAL MULTIVITAMIN CH: 1.0000 | ORAL_TABLET | Freq: Every day | ORAL | Status: DC

## 2019-11-16 MED ORDER — LAMOTRIGINE 100 MG PO TABS: 100.0000 mg | ORAL_TABLET | Freq: Two times a day (BID) | ORAL | 0 refills | Status: DC

## 2019-11-16 NOTE — Progress Notes (Signed)
Patient ID: Karen [name], female   DOB: 07/04/1991, 29 y.o.   MRN: [id number] 11/16/2019 at [place] PM Patient discharged earlier today. Contacted unit and spoke with Probation officer. Reports [facility] will need prior authorization.  Will have pharmacy send over prior auth to unit for completion.  This Probation officer will not be on unit tomorrow so have signed out to [place] NP and to Pharmacist , [place], to expect /complete above . Gabriel Earing MD

## 2019-11-16 NOTE — Progress Notes (Signed)
  [facility] Adult Case Management Discharge Plan :  Will you be returning to the same living situation after discharge:  Yes,  pt's home At discharge, do you have transportation home?: Yes,  pt's friend Do you have the ability to pay for your medications: Yes,  private insurance  Release of information consent forms completed and in the chart;  Patient's signature needed at discharge.  Patient to Follow up at: Follow-up Information    [facility] Follow up on 11/25/2019.   Why: Your hospital discharge appointment will be held by phone on 01/20 at 1:00pm. Your provider will call you. Please have access to your hospital discharge paperwork for this appointment.  Contact information: [address] [place] [address] [phone]           Next level of care provider has access to [place] and Suicide Prevention discussed: Yes,  pt's husband     Has patient been referred to the Quitline?: N/A patient is not a smoker  Patient has been referred for addiction treatment: Yes  Trecia Rogers, [name] 11/16/2019, 10:19 AM

## 2019-11-16 NOTE — Discharge Summary (Addendum)
Physician Discharge Summary Note  Patient:  Karen [name] is an 29 y.o., female MRN:  [id number] DOB:  [birth year] Patient phone:  [phone] (home)  Patient address:   [place] [address],  Total Time spent with patient: 15 minutes  Date of Admission:  11/12/2019 Date of Discharge: 11/16/19  Reason for Admission: mood instability  Principal Problem: Bipolar 1 disorder [facility]) Discharge Diagnoses: Principal Problem:   Bipolar 1 disorder ([place]) Active Problems:   Bipolar depression ([place])   Past Psychiatric History: First admission at age 66 or 70 at [facility] in which the diagnosis of bipolar type I was made.  Past Medical History:  Past Medical History:  Diagnosis Date  . Anxiety   . Bipolar disorder ([place])   . Chronic UTI   . Depression   . Endometriosis   . HSV (herpes simplex virus) anogenital infection     Past Surgical History:  Procedure Laterality Date  . CESAREAN SECTION N/A 02/17/2018   Procedure: CESAREAN SECTION;  Surgeon: Sanjuana Kava, MD;  Location: [facility];  Service: Obstetrics;  Laterality: N/A;  . DIAGNOSTIC LAPAROSCOPY  2014  . DILATION AND EVACUATION N/A 03/11/2015   Procedure: DILATATION AND EVACUATION;  Surgeon: Waymon Amato, MD;  Location: [facility];  Service: Gynecology;  Laterality: N/A;  . TONSILLECTOMY  2015   Family History: History reviewed. No pertinent family history. Family Psychiatric  History: States she has both an uncle and an aunt who have bipolar disorder that have both also been diagnosed as "progressing to schizoaffective" which the patient fears  Social History:  Social History   Substance and Sexual Activity  Alcohol Use No     Social History   Substance and Sexual Activity  Drug Use Yes  . Types: Heroin, Cocaine, Marijuana   Comment: Clean since Aug 2015    Social History   Socioeconomic History  . Marital status: Married    Spouse name: Not on file  . Number of children: Not on file  . Years of  education: Not on file  . Highest education level: Not on file  Occupational History  . Not on file  Tobacco Use  . Smoking status: Former Smoker    Packs/day: 1.00    Types: Cigarettes    Quit date: 03/05/2017    Years since quitting: 2.7  . Smokeless tobacco: Current User  Substance and Sexual Activity  . Alcohol use: No  . Drug use: Yes    Types: Heroin, Cocaine, Marijuana    Comment: Clean since Aug 2015  . Sexual activity: Yes  Other Topics Concern  . Not on file  Social History Narrative  . Not on file   Social Determinants of Health   Financial Resource Strain:   . Difficulty of Paying Living Expenses: Not on file  Food Insecurity:   . Worried About Charity fundraiser in the Last Year: Not on file  . Ran Out of Food in the Last Year: Not on file  Transportation Needs:   . Lack of Transportation (Medical): Not on file  . Lack of Transportation (Non-Medical): Not on file  Physical Activity:   . Days of Exercise per Week: Not on file  . Minutes of Exercise per Session: Not on file  Stress:   . Feeling of Stress : Not on file  Social Connections:   . Frequency of Communication with Friends and Family: Not on file  . Frequency of Social Gatherings with Friends and Family:  Not on file  . Attends Religious Services: Not on file  . Active Member of Clubs or Organizations: Not on file  . Attends Archivist Meetings: Not on file  . Marital Status: Not on file    Hospital Course:  From observation H&P 11/11/2018: Karen [name] an 29 y.o.female who presents voluntarily to [facility] with complaints of passive suicidal ideation for the past few days. Pt reports she has been having worsening depression and anxiety with some passing thoughts to kill herself. States she had appointment with her PCP today for psychiatric medication change due to ineffectiveness but was unable to get her request. Pt reports she also went to [facility] for the same reason but was told the earliest  appointment was on 12/10/2019. Pt sates she feels helpless, worthless, anhedonia, irritable, and anxious. She states she sees a therapist Karen [name] and through therapeutic connections. 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 She takes Lamictal, Buspar and Mirapex. She has had 1 inpatient  in 2013 at [facility] for self cutting and overdose. Pt states she feels she is getting to the point where she was before her past hospitalization. She denies HI, AVH or self harm. Pt states she has lost 15lbs in the past 2 months. She gets about 4-8 hours of sleep. Pt states she does not know if she can contract for safety. Collateral was obtained from Pt's husband Karen [name] [id number] per pt's request. Husband states that patient has been declining mentally and he is concerned for her safety and her ability to continue to care for their 63 month old son. Per husband, he is unable to contract for patient's safety.  From admission H&P 11/12/2018: This is the second lifetime psychiatric admission (the first at age 29) for Karen [name], she is 39 she is married she is employed as a Theatre manager and she has been diagnosed with a bipolar type condition. She presented hoping for medication adjustments, as she reports the past week she had some manic episodes involving excessive spending, "self-defeating behaviors", poor sleep, then followed by more depression this week. Her usual medication list include lamotrigine, Mirapex for irritability, and BuSpar.  She was unable to be seen yesterday by her regular clinicians and came in for further evaluation and made arranged with her husband to watch their son. Past medication trials included Abilify and carbamazepine, she felt the carbamazepine made her more irritable and further caused alopecia and bruxism damaging her teeth.  She believes she was on lithium when she got pregnant and  that of course had to be discontinued.  She has been on Latuda as well there was not particularly successful- Current mental status exam is one of being alert, fully oriented cooperative and pleasant, endorsing passive suicidal thoughts without active plans or intent, denying any psychotic symptoms.  Ms. Okabe was admitted for mood instability with passive suicidal ideation. She remained on the [facility] unit for four days. Lamictal was continued. Prozac and Rexulti were started. She participated in group therapy on the unit. She responded well to treatment with no adverse effects reported. She has shown improved mood, affect, sleep, and interaction. She denies any SI/HI/AVH and contracts for safety. She is discharging on the medications listed below. She agrees to follow up at [facility] (see below). Patient is provided with prescriptions for medications upon discharge. Her friend is picking her up for discharge home.  Physical Findings: AIMS:  Facial and Oral Movements Muscles of Facial Expression: None, normal Lips and Perioral Area: None, normal Jaw: None, normal Tongue: None, normal,Extremity Movements Upper (arms, wrists, hands, fingers): None, normal Lower (legs, knees, ankles, toes): None, normal, Trunk Movements Neck, shoulders, hips: None, normal, Overall Severity Severity of abnormal movements (highest score from questions above): None, normal Incapacitation due to abnormal movements: None, normal Patient's awareness of abnormal movements (rate only patient's report): No Awareness, Dental Status Current problems with teeth and/or dentures?: No Does patient usually wear dentures?: No  CIWA:  CIWA-Ar Total: 1 COWS:     Musculoskeletal: Strength & Muscle Tone: within normal limits Gait & Station: normal Patient leans: N/A  Psychiatric Specialty Exam: Physical Exam  Nursing note and vitals reviewed. Constitutional: She is oriented to person, place, and time. She appears well-developed and  well-nourished.  Cardiovascular: Normal rate.  Respiratory: Effort normal.  Neurological: She is alert and oriented to person, place, and time.    Review of Systems  Constitutional: Negative.   Respiratory: Negative for cough and shortness of breath.   Psychiatric/Behavioral: Negative for agitation, behavioral problems, dysphoric mood, hallucinations, self-injury, sleep disturbance and suicidal ideas. The patient is not nervous/anxious.     Blood pressure 100/67, pulse 96, temperature 98.1 F (36.7 C), temperature source Oral, resp. rate 16, height 5' 2.21" (1.58 m), weight 46 kg, last menstrual period 11/05/2019, SpO2 98 %, not currently breastfeeding.Body mass index is 18.43 kg/m.  See MD's discharge SRA       Has this patient used any form of tobacco in the last 30 days? (Cigarettes, Smokeless Tobacco, Cigars, and/or Pipes) Yes, a prescription for an FDA-approved medication for tobacco cessation was offered at discharge.   Blood Alcohol level:  Lab Results  Component Value Date   ETH <10 [id number]    Metabolic Disorder Labs:  No results found for: HGBA1C, MPG No results found for: PROLACTIN Lab Results  Component Value Date   CHOL 136 11/13/2019   TRIG 35 11/13/2019   HDL 39 (L) 11/13/2019   CHOLHDL 3.5 11/13/2019   VLDL 7 11/13/2019   [place] 90 11/13/2019    See Psychiatric Specialty Exam and Suicide Risk Assessment completed by Attending Physician prior to discharge.  Discharge destination:  Home  Is patient on multiple antipsychotic therapies at discharge:  No   Has Patient had three or more failed trials of antipsychotic monotherapy by history:  No  Recommended Plan for Multiple Antipsychotic Therapies: NA  Discharge Instructions    Discharge instructions   Complete by: As directed    Patient is instructed to take all prescribed medications as recommended. Report any side effects or adverse reactions to your outpatient psychiatrist. Patient is instructed  to abstain from alcohol and illegal drugs while on prescription medications. In the event of worsening symptoms, patient is instructed to call the crisis hotline, 911, or go to the nearest emergency department for evaluation and treatment.     Allergies as of 11/16/2019      Reactions   Adhesive [tape] Hives   Ciprofloxacin Hives      Medication List    STOP taking these medications   busPIRone 15 MG tablet Commonly known as: BUSPAR   ibuprofen 400 MG tablet Commonly known as: ADVIL   Melatonin 5 MG Tabs   Mirapex 0.75 MG tablet Generic drug: pramipexole   valACYclovir 500 MG tablet Commonly known as: VALTREX     TAKE these medications     Indication  Brexpiprazole 0.5 MG Tabs  Take 1 tablet (0.5 mg total) by mouth 2 (two) times daily.  Indication: Major Depressive Disorder   FLUoxetine 20 MG capsule Commonly known as: PROZAC Take 1 capsule (20 mg total) by mouth daily. Start taking on: November 17, 2019  Indication: Depression   lamoTRIgine 100 MG tablet Commonly known as: LAMICTAL Take 1 tablet (100 mg total) by mouth 2 (two) times daily. What changed:   medication strength  how much to take  when to take this  Indication: Manic-Depression   LORazepam 0.5 MG tablet Commonly known as: ATIVAN Take 1 tablet (0.5 mg total) by mouth every 4 (four) hours as needed for anxiety or sleep.  Indication: Feeling Anxious   nicotine polacrilex 2 MG gum Commonly known as: NICORETTE Take 1 each (2 mg total) by mouth as needed for smoking cessation.  Indication: Nicotine Addiction   prenatal multivitamin Tabs tablet Take 1 tablet by mouth daily at 12 noon.  Indication: Supplementation      Follow-up Information    [facility] Follow up on 11/25/2019.   Why: Your hospital discharge appointment will be held by phone on 01/20 at 1:00pm. Your provider will call you. Please have access to your hospital discharge paperwork for this appointment.  Contact information: [address] [place] [address] [phone]           Follow-up recommendations: Activity as tolerated. Diet as recommended by primary care physician. Keep all scheduled follow-up appointments as recommended.   Comments:   Patient is instructed to take all prescribed medications as recommended. Report any side effects or adverse reactions to your outpatient psychiatrist. Patient is instructed to abstain from alcohol and illegal drugs while on prescription medications. In the event of worsening symptoms, patient is instructed to call the crisis hotline, 911, or go to the nearest emergency department for evaluation and treatment.  Signed: Connye Burkitt, NP 11/16/2019, 1:08 PM   Patient seen, Suicide Assessment Completed.  Disposition Plan Reviewed

## 2019-11-16 NOTE — [facility] Suicide Risk Assessment (Signed)
[name] Discharge Suicide Risk Assessment   Principal Problem: Anxiety Discharge Diagnoses: Principal Problem:   Bipolar 1 disorder ([place]) Active Problems:   Bipolar depression ([place])   Total Time spent with patient: 30 minutes  Musculoskeletal: Strength & Muscle Tone: within normal limits Gait & Station: normal Patient leans: N/A  Psychiatric Specialty Exam: Review of Systems denies headache, no chest pain, no shortness of breath, no nausea or vomiting, no rash  Blood pressure 100/67, pulse 96, temperature 98.1 F (36.7 C), temperature source Oral, resp. rate 16, height 5' 2.21" (1.58 m), weight 46 kg, last menstrual period 11/05/2019, SpO2 98 %, not currently breastfeeding.Body mass index is 18.43 kg/m.  General Appearance: improving grooming   Eye Contact::  Good  Speech:  Normal [name]  Volume:  Normal  Mood:  reports she is feeling better today. Mood presents improved   Affect:  less anxious today, affect vaguely constricted but more reactive   Thought Process:  Linear and Descriptions of Associations: Intact  Orientation:  Full (Time, Place, and Person)  Thought Content:  no hallucinations, no delusions, not internally preoccupied   Suicidal Thoughts:  No denies suicidal or self injurious ideations, denies homicidal or violent ideations  Homicidal Thoughts:  No  Memory:  recent and remote grossly intac t  Judgement:  Other:  improving  Insight:  fair- improving   Psychomotor Activity:  Normal- no psychomotor agitation or restlessness at this time  Concentration:  Good  Recall:  Good  Fund of Knowledge:Good  Language: Good  Akathisia:  Negative  Handed:  Right  AIMS (if indicated):     Assets:  Communication Skills Desire for Improvement Social Support  Sleep:  Number of Hours: 4.25  Cognition: WNL  ADL's:  Intact   Mental Status Per Nursing Assessment::   On Admission:  Suicidal ideation indicated by patient  Demographic Factors:  29 year old married, female,  homemaker, has two year old child  Loss Factors: Financial/family stressors. Death of a good friend  Historical Factors: History of prior psychiatric admissions, past history of Bipolar Disorder diagnosis, reports anxiety has been a main concern as well   Risk Reduction Factors:   Responsible for children under 62 years of age, Sense of responsibility to family, Living with another person, especially a relative, Positive social support and Positive coping skills or problem solving skills  Continued Clinical Symptoms:  Currently patient is alert, attentive, calm and in no acute distress at this time, reports feeling better, with improved mood, affect is less anxious and more reactive, smiles at times appropriately during session. No thought disorder, no suicidal or self injurious ideations, no homicidal or violent ideations, denies any hallucinations,no delusions, not internally preoccupied. Denies medication side effects. We reviewed medication side effect profile, including potential for akathisia /motor side effects on brexipiprazole and risk of severe rash on lamotrigine .  With patient's express consent I spoke with her husband on phone, who corroborates patient presents with improvement and is in agreement with discharge plan.  Patient reports history of severe /debilitating anxiety/panic attacks and requests to be prescribed a limited/small number of Ativan to use as PRN in the event of severe anxiety state. Side effects to include abuse potential and sedation have been reviewed .  Restoril has been discontinued prior to her discharge.  Cognitive Features That Contribute To Risk:  No gross cognitive deficits noted upon discharge. Is alert , attentive, and oriented x 3   Suicide Risk:  Mild:  Suicidal ideation of limited frequency,  intensity, duration, and specificity.  There are no identifiable plans, no associated intent, mild dysphoria and related symptoms, good self-control (both  objective and subjective assessment), few other risk factors, and identifiable protective factors, including available and accessible social support.  Follow-up Information    [facility] Follow up on 11/25/2019.   Why: Your hospital discharge appointment will be held by phone on 01/20 at 1:00pm. Your provider will call you. Please have access to your hospital discharge paperwork for this appointment.  Contact information: [address] [place] [address] [phone]           Plan Of Care/Follow-up recommendations:  Activity:  as tolerated  Diet:  regular Tests:  NA Other:  See below  Patient is expressing readiness for discharge and there are no current grounds for involuntary commitment . Plans to return home. Follow up as above .   Jenne Campus, MD 11/16/2019, 12:44 PM

## 2019-11-16 NOTE — Progress Notes (Signed)
Recreation Therapy Notes  Date:  1.11.21 Time: 0930 Location: [address] Dayroom  Group Topic: Stress Management  Goal Area(s) Addresses:  Patient will identify positive stress management techniques. Patient will identify benefits of using stress management post d/c.  Behavioral Response: Engaged  Intervention: Stress Management  Activity : Meditation.  LRT played a meditation that focused on making the most of your day and seeing each day as a new opportunity to accomplish something.  Patients were to listen and follow along as meditation played to engage.  Education:  Stress Management, Discharge Planning.   Education Outcome: Acknowledges Education  Clinical Observations/Feedback: Pt attended and participated in activity.    Victorino Sparrow, LRT/CTRS         Victorino Sparrow A 11/16/2019 11:55 AM

## 2019-11-16 NOTE — Tx Team (Signed)
Interdisciplinary Treatment and Diagnostic Plan Update  11/16/2019 Time of Session:  Karen [name] MRN: [id number]  Principal Diagnosis: Bipolar 1 disorder [facility])  Secondary Diagnoses: Principal Problem:   Bipolar 1 disorder ([place]) Active Problems:   Bipolar depression ([place])   Current Medications:  Current Facility-Administered Medications  Medication Dose Route Frequency Provider Last Rate Last Admin  . acetaminophen (TYLENOL) tablet 650 mg  650 mg Oral Q6H PRN Anike, Adaku C, NP   650 mg at 11/16/19 0543  . alum & mag hydroxide-simeth (MAALOX/MYLANTA) 200-200-20 MG/5ML suspension 30 mL  30 mL Oral Q4H PRN Anike, Adaku C, NP      . alum & mag hydroxide-simeth (MAALOX/MYLANTA) 200-200-20 MG/5ML suspension 30 mL  30 mL Oral Q4H PRN Johnn Hai, MD   30 mL at 11/14/19 0848  . brexpiprazole (REXULTI) tablet 0.5 mg  0.5 mg Oral BID Johnn Hai, MD   0.5 mg at 11/16/19 [id number]  . feeding supplement (BOOST / RESOURCE BREEZE) liquid 1 Container  1 Container Oral BID BM Cobos, Fernando A, MD      . feeding supplement (ENSURE ENLIVE) (ENSURE ENLIVE) liquid 237 mL  237 mL Oral Q24H Cobos, Fernando A, MD      . FLUoxetine (PROZAC) capsule 20 mg  20 mg Oral Daily Johnn Hai, MD   20 mg at 11/16/19 0823  . folic acid (FOLVITE) tablet 1 mg  1 mg Oral Daily Johnn Hai, MD   1 mg at 11/16/19 [id number]  . lamoTRIgine (LAMICTAL) tablet 100 mg  100 mg Oral BID Johnn Hai, MD   100 mg at 11/16/19 0823  . LORazepam (ATIVAN) tablet 0.5 mg  0.5 mg Oral Q4H PRN Cobos, Myer Peer, MD   0.5 mg at 11/15/19 2100  . magnesium hydroxide (MILK OF MAGNESIA) suspension 30 mL  30 mL Oral Daily PRN Anike, Adaku C, NP      . magnesium hydroxide (MILK OF MAGNESIA) suspension 30 mL  30 mL Oral Daily PRN Johnn Hai, MD      . nicotine polacrilex (NICORETTE) gum 2 mg  2 mg Oral PRN Cobos, Myer Peer, MD   2 mg at 11/15/19 1803  . prenatal multivitamin tablet 1 tablet  1 tablet Oral Q1200 Johnn Hai, MD   1 tablet at  11/15/19 1204  . temazepam (RESTORIL) capsule 15 mg  15 mg Oral QHS Cobos, Myer Peer, MD   15 mg at 11/15/19 2100  . traZODone (DESYREL) tablet 25 mg  25 mg Oral QHS PRN Cobos, Myer Peer, MD       PTA Medications: Medications Prior to Admission  Medication Sig Dispense Refill Last Dose  . ibuprofen (ADVIL) 400 MG tablet Take 400 mg by mouth every 6 (six) hours as needed for headache or mild pain.     . Melatonin 5 MG TABS Take 5 mg by mouth at bedtime as needed.     . busPIRone (BUSPAR) 15 MG tablet Take 1 tablet (15 mg total) by mouth 2 (two) times daily. 60 tablet 0   . lamoTRIgine (LAMICTAL) 200 MG tablet Take 2 tablets (400 mg total) by mouth every evening. 60 tablet 0   . pramipexole (MIRAPEX) 0.75 MG tablet Take 0.75 mg by mouth daily.     . valACYclovir (VALTREX) 500 MG tablet Take 500 mg by mouth 2 (two) times daily as needed.       Patient Stressors: Financial difficulties Health problems Medication change or noncompliance  Patient Strengths: Ability for insight Average or above  average intelligence Communication skills Supportive family/friends  Treatment Modalities: Medication Management, Group therapy, Case management,  1 to 1 session with clinician, Psychoeducation, Recreational therapy.   Physician Treatment Plan for Primary Diagnosis: Bipolar 1 disorder ([place]) Long Term Goal(s): Improvement in symptoms so as ready for discharge Improvement in symptoms so as ready for discharge   Short Term Goals: Ability to verbalize feelings will improve Ability to disclose and discuss suicidal ideas Ability to demonstrate self-control will improve Ability to identify and develop effective coping behaviors will improve Ability to maintain clinical measurements within normal limits will improve Compliance with prescribed medications will improve Ability to identify triggers associated with substance abuse/mental health issues will improve Ability to identify changes in lifestyle  to reduce recurrence of condition will improve Ability to verbalize feelings will improve Ability to disclose and discuss suicidal ideas Ability to demonstrate self-control will improve Ability to identify and develop effective coping behaviors will improve Ability to maintain clinical measurements within normal limits will improve  Medication Management: Evaluate patient's response, side effects, and tolerance of medication regimen.  Therapeutic Interventions: 1 to 1 sessions, Unit Group sessions and Medication administration.  Evaluation of Outcomes: Adequate for Discharge  Physician Treatment Plan for Secondary Diagnosis: Principal Problem:   Bipolar 1 disorder ([place]) Active Problems:   Bipolar depression ([place])  Long Term Goal(s): Improvement in symptoms so as ready for discharge Improvement in symptoms so as ready for discharge   Short Term Goals: Ability to verbalize feelings will improve Ability to disclose and discuss suicidal ideas Ability to demonstrate self-control will improve Ability to identify and develop effective coping behaviors will improve Ability to maintain clinical measurements within normal limits will improve Compliance with prescribed medications will improve Ability to identify triggers associated with substance abuse/mental health issues will improve Ability to identify changes in lifestyle to reduce recurrence of condition will improve Ability to verbalize feelings will improve Ability to disclose and discuss suicidal ideas Ability to demonstrate self-control will improve Ability to identify and develop effective coping behaviors will improve Ability to maintain clinical measurements within normal limits will improve     Medication Management: Evaluate patient's response, side effects, and tolerance of medication regimen.  Therapeutic Interventions: 1 to 1 sessions, Unit Group sessions and Medication administration.  Evaluation of Outcomes: Adequate  for Discharge   RN Treatment Plan for Primary Diagnosis: Bipolar 1 disorder ([place]) Long Term Goal(s): Knowledge of disease and therapeutic regimen to maintain health will improve  Short Term Goals: Ability to verbalize feelings will improve, Ability to disclose and discuss suicidal ideas, Ability to identify and develop effective coping behaviors will improve and Compliance with prescribed medications will improve  Medication Management: RN will administer medications as ordered by provider, will assess and evaluate patient's response and provide education to patient for prescribed medication. RN will report any adverse and/or side effects to prescribing provider.  Therapeutic Interventions: 1 on 1 counseling sessions, Psychoeducation, Medication administration, Evaluate responses to treatment, Monitor vital signs and CBGs as ordered, Perform/monitor CIWA, COWS, AIMS and Fall Risk screenings as ordered, Perform wound care treatments as ordered.  Evaluation of Outcomes: Adequate for Discharge   [name] Treatment Plan for Primary Diagnosis: Bipolar 1 disorder ([place]) Long Term Goal(s): Safe transition to appropriate next level of care at discharge, Engage patient in therapeutic group addressing interpersonal concerns.  Short Term Goals: Engage patient in aftercare planning with referrals and resources  Therapeutic Interventions: Assess for all discharge needs, 1 to 1 time with Education officer, museum, Explore  available resources and support systems, Assess for adequacy in community support network, Educate family and significant other(s) on suicide prevention, Complete Psychosocial Assessment, Interpersonal group therapy.  Evaluation of Outcomes: Adequate for Discharge   Progress in Treatment: Attending groups: Yes. Participating in groups: Yes. Taking medication as prescribed: Yes. Toleration medication: Yes. Family/Significant other contact made: Yes, individual(s) contacted:  the patient's  husband Patient understands diagnosis: Yes. Discussing patient identified problems/goals with staff: Yes. Medical problems stabilized or resolved: Yes. Denies suicidal/homicidal ideation: Yes. Issues/concerns per patient self-inventory: No. Other:   New problem(s) identified: None   New Short Term/Long Term Goal(s): medication stabilization, elimination of SI thoughts, development of comprehensive mental wellness plan.    Patient Goals:    Discharge Plan or Barriers: Patient plans to return home with her husband and child. She will follow up with [facility] for outpatient medication management and therapy services at discharge.   Reason for Continuation of Hospitalization: None   Estimated Length of Stay: 11/16/2019  Attendees: Patient: 11/16/2019 10:18 AM  Physician: Dr. Neita Garnet, MD 11/16/2019 10:18 AM  Nursing: Will. Darnell Level RN 11/16/2019 10:18 AM  RN Care Manager: 11/16/2019 10:18 AM  Social Worker: Radonna Ricker, [name] 11/16/2019 10:18 AM  Recreational Therapist:  11/16/2019 10:18 AM  Other:  11/16/2019 10:18 AM  Other:  11/16/2019 10:18 AM  Other: 11/16/2019 10:18 AM    Scribe for Treatment Team: Marylee Floras, [place] 11/16/2019 10:18 AM

## 2019-11-16 NOTE — [facility] Suicide Risk Assessment (Signed)
[place] INPATIENT:  Family/Significant Other Suicide Prevention Education  Suicide Prevention Education:  Education Completed; husband, Mileidy Grandinetti [phone] has been identified by the patient as the family member/significant other with whom the patient will be residing, and identified as the person(s) who will aid the patient in the event of a mental health crisis (suicidal ideations/suicide attempt).  With written consent from the patient, the family member/significant other has been provided the following suicide prevention education, prior to the and/or following the discharge of the patient.  The suicide prevention education provided includes the following:  Suicide risk factors  Suicide prevention and interventions  National Suicide Hotline telephone number  [facility] assessment telephone number  [facility] Emergency Assistance [place] and/or Residential Mobile Crisis Unit telephone number  Request made of family/significant other to:  Remove weapons (e.g., guns, rifles, knives), all items previously/currently identified as safety concern.    Remove drugs/medications (over-the-counter, prescriptions, illicit drugs), all items previously/currently identified as a safety concern.  The family member/significant other verbalizes understanding of the suicide prevention education information provided.  The family member/significant other agrees to remove the items of safety concern listed above.  Joellen Jersey 11/16/2019, 9:43 AM

## 2019-11-16 NOTE — Progress Notes (Signed)
RN met with pt and reviewed pt's discharge instructions.  Pt verbalized understanding of discharge instructions and pt did not have any questions. RN returned pt's belongings  to pt.   Prescriptions were given to pt.  Pt denies SI/HI/AVH and voiced no concerns.  Pt was appreciative of the care pt received at [facility].  Patient discharged to the lobby.

## 2020-01-28 ENCOUNTER — Ambulatory Visit: Payer: 59 | Attending: Internal Medicine

## 2020-01-28 DIAGNOSIS — Z23 Encounter for immunization: Secondary | ICD-10-CM

## 2020-01-28 NOTE — Progress Notes (Signed)
   Covid-19 Vaccination Clinic  Name:  Karen [name]    MRN: [id number] DOB: 08/23/1991  01/28/2020  [name] was observed post Covid-19 immunization for 15 minutes without incident. She was provided with Vaccine Information Sheet and instruction to access the V-Safe system.   [name] was instructed to call 911 with any severe reactions post vaccine: [place] Kitchen Difficulty breathing  . Swelling of face and throat  . A fast heartbeat  . A bad rash all over body  . Dizziness and weakness   Immunizations Administered    Name Date Dose VIS Date Route   Pfizer COVID-19 Vaccine 01/28/2020  3:53 PM 0.3 mL 10/16/2019 Intramuscular   Manufacturer: [place]   Lot: [id number]   [place]: [id number]

## 2020-02-15 ENCOUNTER — Other Ambulatory Visit: Payer: Self-pay

## 2020-02-16 ENCOUNTER — Encounter: Payer: Self-pay | Admitting: Internal Medicine

## 2020-02-16 ENCOUNTER — Ambulatory Visit (INDEPENDENT_AMBULATORY_CARE_PROVIDER_SITE_OTHER): Payer: 59 | Admitting: Internal Medicine

## 2020-02-16 VITALS — BP 94/62 | HR 77 | Temp 96.0°F | Ht 63.5 in | Wt 99.4 lb

## 2020-02-16 DIAGNOSIS — F319 Bipolar disorder, unspecified: Secondary | ICD-10-CM | POA: Diagnosis not present

## 2020-02-16 DIAGNOSIS — M2550 Pain in unspecified joint: Secondary | ICD-10-CM

## 2020-02-16 NOTE — Progress Notes (Signed)
Established Patient Office Visit     This visit occurred during the SARS-CoV-2 public health emergency.  Safety protocols were in place, including screening questions prior to the visit, additional usage of staff PPE, and extensive cleaning of exam room while observing appropriate contact time as indicated for disinfecting solutions.    CC/Reason for Visit: Annual preventive exam, discuss joint pain  HPI: Karen [name] is a 29 y.o. female who is coming in today for the above mentioned reasons. Past Medical History is significant for: bipolar disorder followed closely by psychiatry. She had a brief psychiatric admission in January. Her mood has been stable, she has been feeling quite well. She has eye and dental care. She had her first COVID vaccine. Has been having multiple joint pain; is concerned as RA a psoriatric arthritis "runs in the family".   Past Medical/Surgical History: Past Medical History:  Diagnosis Date  . Anxiety   . Bipolar disorder ([place])   . Chronic UTI   . Depression   . Endometriosis   . HSV (herpes simplex virus) anogenital infection     Past Surgical History:  Procedure Laterality Date  . CESAREAN SECTION N/A 02/17/2018   Procedure: CESAREAN SECTION;  Surgeon: Sanjuana Kava, MD;  Location: [facility];  Service: Obstetrics;  Laterality: N/A;  . DIAGNOSTIC LAPAROSCOPY  2014  . DILATION AND EVACUATION N/A 03/11/2015   Procedure: DILATATION AND EVACUATION;  Surgeon: Waymon Amato, MD;  Location: [facility];  Service: Gynecology;  Laterality: N/A;  . TONSILLECTOMY  2015    Social History:  reports that she quit smoking about 2 years ago. Her smoking use included cigarettes. She smoked 1.00 pack per day. She uses smokeless tobacco. She reports current drug use. Drugs: Heroin, Cocaine, and Marijuana. She reports that she does not drink alcohol.  Allergies: Allergies  Allergen Reactions  . Adhesive [Tape] Hives  . Ciprofloxacin Hives    Family  History:  No CAD, CVA, cancer.  Current Outpatient Medications:  .  brexpiprazole 0.5 MG TABS, Take 1 tablet (0.5 mg total) by mouth 2 (two) times daily., Disp: 60 tablet, Rfl: 0 .  FLUoxetine (PROZAC) 20 MG capsule, Take 1 capsule (20 mg total) by mouth daily., Disp: 30 capsule, Rfl: 0 .  folic acid (FOLVITE) 1 MG tablet, Take 1 mg by mouth daily., Disp: , Rfl:  .  lamoTRIgine (LAMICTAL) 100 MG tablet, Take 1 tablet (100 mg total) by mouth 2 (two) times daily., Disp: 60 tablet, Rfl: 0 .  LORazepam (ATIVAN) 0.5 MG tablet, Take 1 tablet (0.5 mg total) by mouth every 4 (four) hours as needed for anxiety or sleep., Disp: 10 tablet, Rfl: 0 .  NON FORMULARY, CBD OIL, Disp: , Rfl:   Review of Systems:  Constitutional: Denies fever, chills, diaphoresis, appetite change and fatigue.  HEENT: Denies photophobia, eye pain, redness, hearing loss, ear pain, congestion, sore throat, rhinorrhea, sneezing, mouth sores, trouble swallowing, neck pain, neck stiffness and tinnitus.   Respiratory: Denies SOB, DOE, cough, chest tightness,  and wheezing.   Cardiovascular: Denies chest pain, palpitations and leg swelling.  Gastrointestinal: Denies nausea, vomiting, abdominal pain, diarrhea, constipation, blood in stool and abdominal distention.  Genitourinary: Denies dysuria, urgency, frequency, hematuria, flank pain and difficulty urinating.  Endocrine: Denies: hot or cold intolerance, sweats, changes in hair or nails, polyuria, polydipsia. Musculoskeletal: Denies myalgias, back pain, joint swelling  and gait problem.  Skin: Denies pallor, rash and wound.  Neurological: Denies dizziness, seizures, syncope, weakness, light-headedness,  numbness and headaches.  Hematological: Denies adenopathy. Easy bruising, personal or family bleeding history  Psychiatric/Behavioral: Denies suicidal ideation, mood changes, confusion, nervousness, sleep disturbance and agitation    Physical Exam: Vitals:   02/16/20 1035  BP:  94/62  Pulse: 77  Temp: (!) 96 F (35.6 C)  TempSrc: Temporal  SpO2: 99%  Weight: 99 lb 6.4 oz (45.1 kg)  Height: 5' 3.5" (1.613 m)    Body mass index is 17.33 kg/m.   Constitutional: NAD, calm, comfortable Eyes: PERRL, lids and conjunctivae normal ENMT: Mucous membranes are moist. Tympanic membrane is pearly white, no erythema or bulging. Neck: normal, supple, no masses, no thyromegaly Respiratory: clear to auscultation bilaterally, no wheezing, no crackles. Normal respiratory effort. No accessory muscle use.  Cardiovascular: Regular rate and rhythm, no murmurs / rubs / gallops. No extremity edema. Abdomen: no tenderness, no masses palpated. No hepatosplenomegaly. Bowel sounds positive.  Musculoskeletal: no clubbing / cyanosis. No joint deformity upper and lower extremities. Good ROM, no contractures. Normal muscle tone.  Skin: no rashes, lesions, ulcers. No induration Neurologic: CN 2-12 grossly intact. Sensation intact, DTR normal. Strength 5/5 in all 4.  Psychiatric: Normal judgment and insight. Alert and oriented x 3. Normal mood.    Impression and Plan:  Bipolar depression ([place]) -Mood stable, followed closely by psychiatry.   Multiple joint pain  - Plan: Ambulatory referral to Rheumatology; no visible joint abnormalities    Patient Instructions  -Nice seeing you today!!  -No need for labs today.  -Will schedule referral to see rheumatologist.  -Schedule follow up in 1 year or sooner as needed.   Preventive Care 43-79 Years Old, Female Preventive care refers to visits with your health care provider and lifestyle choices that can promote health and wellness. This includes:  A yearly physical exam. This may also be called an annual well check.  Regular dental visits and eye exams.  Immunizations.  Screening for certain conditions.  Healthy lifestyle choices, such as eating a healthy diet, getting regular exercise, not using drugs or products that contain  nicotine and tobacco, and limiting alcohol use. What can I expect for my preventive care visit? Physical exam Your health care provider will check your:  Height and weight. This may be used to calculate body mass index (BMI), which tells if you are at a healthy weight.  Heart rate and blood pressure.  Skin for abnormal spots. Counseling Your health care provider may ask you questions about your:  Alcohol, tobacco, and drug use.  Emotional well-being.  Home and relationship well-being.  Sexual activity.  Eating habits.  Work and work Statistician.  Method of birth control.  Menstrual cycle.  Pregnancy history. What immunizations do I need?  Influenza (flu) vaccine  This is recommended every year. Tetanus, diphtheria, and pertussis (Tdap) vaccine  You may need a Td booster every 10 years. Varicella (chickenpox) vaccine  You may need this if you have not been vaccinated. Human papillomavirus (HPV) vaccine  If recommended by your health care provider, you may need three doses over 6 months. Measles, mumps, and rubella (MMR) vaccine  You may need at least one dose of MMR. You may also need a second dose. Meningococcal conjugate (MenACWY) vaccine  One dose is recommended if you are age 56-21 years and a first-year college student living in a residence hall, or if you have one of several medical conditions. You may also need additional booster doses. Pneumococcal conjugate (PCV13) vaccine  You may need this if you  have certain conditions and were not previously vaccinated. Pneumococcal polysaccharide (PPSV23) vaccine  You may need one or two doses if you smoke cigarettes or if you have certain conditions. Hepatitis A vaccine  You may need this if you have certain conditions or if you travel or work in places where you may be exposed to hepatitis A. Hepatitis B vaccine  You may need this if you have certain conditions or if you travel or work in places where you  may be exposed to hepatitis B. Haemophilus influenzae type b (Hib) vaccine  You may need this if you have certain conditions. You may receive vaccines as individual doses or as more than one vaccine together in one shot (combination vaccines). Talk with your health care provider about the risks and benefits of combination vaccines. What tests do I need?  Blood tests  Lipid and cholesterol levels. These may be checked every 5 years starting at age 53.  Hepatitis C test.  Hepatitis B test. Screening  Diabetes screening. This is done by checking your blood sugar (glucose) after you have not eaten for a while (fasting).  Sexually transmitted disease (STD) testing.  BRCA-related cancer screening. This may be done if you have a family history of breast, ovarian, tubal, or peritoneal cancers.  Pelvic exam and Pap test. This may be done every 3 years starting at age 79. Starting at age 70, this may be done every 5 years if you have a Pap test in combination with an HPV test. Talk with your health care provider about your test results, treatment options, and if necessary, the need for more tests. Follow these instructions at home: Eating and drinking   Eat a diet that includes fresh fruits and vegetables, whole grains, lean protein, and low-fat dairy.  Take vitamin and mineral supplements as recommended by your health care provider.  Do not drink alcohol if: ? Your health care provider tells you not to drink. ? You are pregnant, may be pregnant, or are planning to become pregnant.  If you drink alcohol: ? Limit how much you have to 0-1 drink a day. ? Be aware of how much alcohol is in your drink. In the [place] one drink equals one 12 oz bottle of beer (355 mL), one 5 oz glass of wine (148 mL), or one 1 oz glass of hard liquor (44 mL). Lifestyle  Take daily care of your teeth and gums.  Stay active. Exercise for at least 30 minutes on 5 or more days each week.  Do not use any  products that contain nicotine or tobacco, such as cigarettes, e-cigarettes, and chewing tobacco. If you need help quitting, ask your health care provider.  If you are sexually active, practice safe sex. Use a condom or other form of birth control (contraception) in order to prevent pregnancy and STIs (sexually transmitted infections). If you plan to become pregnant, see your health care provider for a preconception visit. What's next?  Visit your health care provider once a year for a well check visit.  Ask your health care provider how often you should have your eyes and teeth checked.  Stay up to date on all vaccines. This information is not intended to replace advice given to you by your health care provider. Make sure you discuss any questions you have with your health care provider. Document Revised: 07/03/2018 Document Reviewed: 07/03/2018 Elsevier Patient Education  2020 [place], MD [facility]

## 2020-02-16 NOTE — Patient Instructions (Signed)
-Nice seeing you today!!  -No need for labs today.  -Will schedule referral to see rheumatologist.  -Schedule follow up in 1 year or sooner as needed.   Preventive Care 57-27 Years Old, Female Preventive care refers to visits with your health care provider and lifestyle choices that can promote health and wellness. This includes:  A yearly physical exam. This may also be called an annual well check.  Regular dental visits and eye exams.  Immunizations.  Screening for certain conditions.  Healthy lifestyle choices, such as eating a healthy diet, getting regular exercise, not using drugs or products that contain nicotine and tobacco, and limiting alcohol use. What can I expect for my preventive care visit? Physical exam Your health care provider will check your:  Height and weight. This may be used to calculate body mass index (BMI), which tells if you are at a healthy weight.  Heart rate and blood pressure.  Skin for abnormal spots. Counseling Your health care provider may ask you questions about your:  Alcohol, tobacco, and drug use.  Emotional well-being.  Home and relationship well-being.  Sexual activity.  Eating habits.  Work and work Statistician.  Method of birth control.  Menstrual cycle.  Pregnancy history. What immunizations do I need?  Influenza (flu) vaccine  This is recommended every year. Tetanus, diphtheria, and pertussis (Tdap) vaccine  You may need a Td booster every 10 years. Varicella (chickenpox) vaccine  You may need this if you have not been vaccinated. Human papillomavirus (HPV) vaccine  If recommended by your health care provider, you may need three doses over 6 months. Measles, mumps, and rubella (MMR) vaccine  You may need at least one dose of MMR. You may also need a second dose. Meningococcal conjugate (MenACWY) vaccine  One dose is recommended if you are age 7-21 years and a first-year college student living in a  residence hall, or if you have one of several medical conditions. You may also need additional booster doses. Pneumococcal conjugate (PCV13) vaccine  You may need this if you have certain conditions and were not previously vaccinated. Pneumococcal polysaccharide (PPSV23) vaccine  You may need one or two doses if you smoke cigarettes or if you have certain conditions. Hepatitis A vaccine  You may need this if you have certain conditions or if you travel or work in places where you may be exposed to hepatitis A. Hepatitis B vaccine  You may need this if you have certain conditions or if you travel or work in places where you may be exposed to hepatitis B. Haemophilus influenzae type b (Hib) vaccine  You may need this if you have certain conditions. You may receive vaccines as individual doses or as more than one vaccine together in one shot (combination vaccines). Talk with your health care provider about the risks and benefits of combination vaccines. What tests do I need?  Blood tests  Lipid and cholesterol levels. These may be checked every 5 years starting at age 36.  Hepatitis C test.  Hepatitis B test. Screening  Diabetes screening. This is done by checking your blood sugar (glucose) after you have not eaten for a while (fasting).  Sexually transmitted disease (STD) testing.  BRCA-related cancer screening. This may be done if you have a family history of breast, ovarian, tubal, or peritoneal cancers.  Pelvic exam and Pap test. This may be done every 3 years starting at age 40. Starting at age 28, this may be done every 5 years if you  have a Pap test in combination with an HPV test. Talk with your health care provider about your test results, treatment options, and if necessary, the need for more tests. Follow these instructions at home: Eating and drinking   Eat a diet that includes fresh fruits and vegetables, whole grains, lean protein, and low-fat dairy.  Take vitamin  and mineral supplements as recommended by your health care provider.  Do not drink alcohol if: ? Your health care provider tells you not to drink. ? You are pregnant, may be pregnant, or are planning to become pregnant.  If you drink alcohol: ? Limit how much you have to 0-1 drink a day. ? Be aware of how much alcohol is in your drink. In the [place] one drink equals one 12 oz bottle of beer (355 mL), one 5 oz glass of wine (148 mL), or one 1 oz glass of hard liquor (44 mL). Lifestyle  Take daily care of your teeth and gums.  Stay active. Exercise for at least 30 minutes on 5 or more days each week.  Do not use any products that contain nicotine or tobacco, such as cigarettes, e-cigarettes, and chewing tobacco. If you need help quitting, ask your health care provider.  If you are sexually active, practice safe sex. Use a condom or other form of birth control (contraception) in order to prevent pregnancy and STIs (sexually transmitted infections). If you plan to become pregnant, see your health care provider for a preconception visit. What's next?  Visit your health care provider once a year for a well check visit.  Ask your health care provider how often you should have your eyes and teeth checked.  Stay up to date on all vaccines. This information is not intended to replace advice given to you by your health care provider. Make sure you discuss any questions you have with your health care provider. Document Revised: 07/03/2018 Document Reviewed: 07/03/2018 Elsevier Patient Education  2020 [employer].

## 2020-02-22 ENCOUNTER — Ambulatory Visit: Payer: 59 | Attending: Internal Medicine

## 2020-02-22 DIAGNOSIS — Z23 Encounter for immunization: Secondary | ICD-10-CM

## 2020-02-22 NOTE — Progress Notes (Signed)
   Covid-19 Vaccination Clinic  Name:  Karen [name]    MRN: [id number] DOB: [birth year]  02/22/2020  [name] was observed post Covid-19 immunization for 15 minutes without incident. She was provided with Vaccine Information Sheet and instruction to access the V-Safe system.   [name] was instructed to call 911 with any severe reactions post vaccine: Marland Kitchen Difficulty breathing  . Swelling of face and throat  . A fast heartbeat  . A bad rash all over body  . Dizziness and weakness   Immunizations Administered    Name Date Dose VIS Date Route   Pfizer COVID-19 Vaccine 02/22/2020  4:43 PM 0.3 mL 12/30/2018 Intramuscular   Manufacturer: [place]   Lot: [id number]   [place]: [id number]

## 2020-06-21 ENCOUNTER — Ambulatory Visit: Payer: 59 | Admitting: Internal Medicine

## 2020-06-21 ENCOUNTER — Encounter: Payer: Self-pay | Admitting: Internal Medicine

## 2020-06-21 ENCOUNTER — Other Ambulatory Visit: Payer: Self-pay

## 2020-06-21 VITALS — BP 98/68 | HR 107 | Temp 98.5°F | Wt 97.2 lb

## 2020-06-21 DIAGNOSIS — R232 Flushing: Secondary | ICD-10-CM | POA: Diagnosis not present

## 2020-06-21 DIAGNOSIS — R61 Generalized hyperhidrosis: Secondary | ICD-10-CM | POA: Diagnosis not present

## 2020-06-21 LAB — POCT URINE PREGNANCY: Preg Test, Ur: NEGATIVE

## 2020-06-21 NOTE — Addendum Note (Signed)
Addended by: Westley Hummer B on: 06/21/2020 01:31 PM   Modules accepted: Orders

## 2020-06-21 NOTE — Progress Notes (Signed)
Established Patient Office Visit     This visit occurred during the SARS-CoV-2 public health emergency.  Safety protocols were in place, including screening questions prior to the visit, additional usage of staff PPE, and extensive cleaning of exam room while observing appropriate contact time as indicated for disinfecting solutions.    CC/Reason for Visit: Discuss acute concerns  HPI: Karen [name] is a 29 y.o. female who is coming in today for the above mentioned reasons. Past Medical History is significant for: Bipolar disease anxiety disorder followed by psychiatry on multiple psychotropic medications.  She states that for 2 to 3 months she has been having intense hot flashes and night sweats, significant diarrhea on almost a daily occurrence, she feels shaky (has attributed this to her mental health illness).  She has not yet seen her gynecologist, she feels like her periods are a lot lighter than usual.  She uses an IUD for contraception, she has had this 1 for 3 years.   Past Medical/Surgical History: Past Medical History:  Diagnosis Date  . Anxiety   . Bipolar disorder ([place])   . Chronic UTI   . Depression   . Endometriosis   . HSV (herpes simplex virus) anogenital infection     Past Surgical History:  Procedure Laterality Date  . CESAREAN SECTION N/A 02/17/2018   Procedure: CESAREAN SECTION;  Surgeon: Sanjuana Kava, MD;  Location: [place];  Service: Obstetrics;  Laterality: N/A;  . DIAGNOSTIC LAPAROSCOPY  2014  . DILATION AND EVACUATION N/A 03/11/2015   Procedure: DILATATION AND EVACUATION;  Surgeon: Waymon Amato, MD;  Location: [facility];  Service: Gynecology;  Laterality: N/A;  . TONSILLECTOMY  2015    Social History:  reports that she quit smoking about 3 years ago. Her smoking use included cigarettes. She smoked 1.00 pack per day. She uses smokeless tobacco. She reports current drug use. Drugs: Heroin, Cocaine, and Marijuana. She reports that she does not  drink alcohol.  Allergies: Allergies  Allergen Reactions  . Adhesive [Tape] Hives  . Ciprofloxacin Hives    Family History:  No history of heart disease, cancer, stroke that she is aware of  Current Outpatient Medications:  .  brexpiprazole 0.5 MG TABS, Take 1 tablet (0.5 mg total) by mouth 2 (two) times daily., Disp: 60 tablet, Rfl: 0 .  FLUoxetine (PROZAC) 20 MG capsule, Take 1 capsule (20 mg total) by mouth daily., Disp: 30 capsule, Rfl: 0 .  folic acid (FOLVITE) 1 MG tablet, Take 1 mg by mouth daily., Disp: , Rfl:  .  lamoTRIgine (LAMICTAL) 100 MG tablet, Take 1 tablet (100 mg total) by mouth 2 (two) times daily., Disp: 60 tablet, Rfl: 0 .  LORazepam (ATIVAN) 0.5 MG tablet, Take 1 tablet (0.5 mg total) by mouth every 4 (four) hours as needed for anxiety or sleep., Disp: 10 tablet, Rfl: 0 .  NON FORMULARY, CBD OIL, Disp: , Rfl:  .  Specialty Vitamins Products (MAGNESIUM, AMINO ACID CHELATE,) 133 MG tablet, Take 1 tablet by mouth 2 (two) times daily., Disp: , Rfl:   Review of Systems: Constitutional: Denies fever, chills, diaphoresis, appetite change. HEENT: Denies photophobia, eye pain, redness, hearing loss, ear pain, congestion, sore throat, rhinorrhea, sneezing, mouth sores, trouble swallowing, neck pain, neck stiffness and tinnitus.   Respiratory: Denies SOB, DOE, cough, chest tightness,  and wheezing.   Cardiovascular: Denies chest pain, palpitations and leg swelling.  Gastrointestinal: Denies nausea, vomiting, abdominal pain, diarrhea, constipation, blood in stool and abdominal  distention.  Genitourinary: Denies dysuria, urgency, frequency, hematuria, flank pain and difficulty urinating.  Endocrine: Denies: changes in hair or nails, polyuria, polydipsia. Musculoskeletal: Denies myalgias, back pain, joint swelling, arthralgias and gait problem.  Skin: Denies pallor, rash and wound.  Neurological: Denies dizziness, seizures, syncope, weakness, light-headedness, numbness and  headaches.  Hematological: Denies adenopathy. Easy bruising, personal or family bleeding history  Psychiatric/Behavioral: Denies suicidal ideation,  confusion, nervousness, sleep disturbance    Physical Exam: Vitals:   06/21/20 1302  BP: 98/68  Pulse: (!) 107  Temp: 98.5 F (36.9 C)  TempSrc: Oral  SpO2: 96%  Weight: 97 lb 3.2 oz (44.1 kg)    Body mass index is 16.95 kg/m.   Constitutional: NAD, calm, comfortable Eyes: PERRL, lids and conjunctivae normal ENMT: Mucous membranes are moist. Respiratory: clear to auscultation bilaterally, no wheezing, no crackles. Normal respiratory effort. No accessory muscle use.  Cardiovascular: Regular rate and rhythm, no murmurs / rubs / gallops. No extremity edema.  Neurologic: Grossly intact and nonfocal Psychiatric: Normal judgment and insight. Alert and oriented x 3. Normal mood.    Impression and Plan:  Hot flashes  Night sweats  -Symptoms sound like possibly related to hyperthyroidism. -Interestingly in January her TSH was low normal at 0.6. -Check TSH, free T3, free T4. -If thyroid function is normal, consider referral back to GYN. -Pregnancy test is negative.   Patient Instructions  -Nice seeing you today!!  -Lab work today; will notify you once results are available.       Lelon Frohlich, MD [facility] at [facility]

## 2020-06-21 NOTE — Addendum Note (Signed)
Addended by: Marrion Coy on: 06/21/2020 01:32 PM   Modules accepted: Orders

## 2020-06-21 NOTE — Patient Instructions (Signed)
-  Nice seeing you today!!  -Lab work today; will notify you once results are available.

## 2020-06-22 LAB — T3, FREE: T3, Free: 3.2 pg/mL (ref 2.3–4.2)

## 2020-06-22 LAB — TSH: TSH: 0.68 mIU/L

## 2020-06-22 LAB — T4, FREE: Free T4: 1 ng/dL (ref 0.8–1.8)

## 2020-08-25 ENCOUNTER — Encounter: Payer: Self-pay | Admitting: Internal Medicine

## 2020-08-25 ENCOUNTER — Encounter (HOSPITAL_COMMUNITY): Payer: Self-pay

## 2020-08-25 ENCOUNTER — Other Ambulatory Visit: Payer: Self-pay

## 2020-08-25 ENCOUNTER — Ambulatory Visit (HOSPITAL_COMMUNITY)
Admission: RE | Admit: 2020-08-25 | Discharge: 2020-08-25 | Disposition: A | Discharge status: Home / Self Care | Payer: 59 | Source: Ambulatory Visit | Attending: Family Medicine | Admitting: Family Medicine

## 2020-08-25 VITALS — BP 106/68 | HR 61 | Temp 98.1°F | Resp 17

## 2020-08-25 DIAGNOSIS — N3001 Acute cystitis with hematuria: Secondary | ICD-10-CM | POA: Insufficient documentation

## 2020-08-25 DIAGNOSIS — N390 Urinary tract infection, site not specified: Secondary | ICD-10-CM

## 2020-08-25 LAB — POCT URINALYSIS DIPSTICK, ED / UC
Glucose, UA: 100 mg/dL — AB
Nitrite: POSITIVE — AB
Protein, ur: 100 mg/dL — AB
Specific Gravity, Urine: 1.01 (ref 1.005–1.030)
Urobilinogen, UA: 4 mg/dL — ABNORMAL HIGH (ref 0.0–1.0)
pH: 5 (ref 5.0–8.0)

## 2020-08-25 MED ORDER — NITROFURANTOIN MONOHYD MACRO 100 MG PO CAPS: 100.0000 mg | ORAL_CAPSULE | Freq: Two times a day (BID) | ORAL | 0 refills | Status: AC

## 2020-08-25 NOTE — ED Provider Notes (Signed)
[facility]    CSN: [id number] Arrival date & time: 08/25/20  [place]      History   Chief Complaint Chief Complaint  Patient presents with  . Appointment  . Urinary Tract Infection    HPI Karen [name] is a 29 y.o. female.   HPI   Karen [name] is a 29 y.o. female presents for evaluation of urinary frequency, urgency and dysuria x 3 days, with back pain and nausea.  Symptoms started today.  She has been drinking water and taking Azo throughout the day for pain.  She has a distant history of recurrent UTIs although has not had a recent UTI.  Previous history of UTI and reports failure with  antibiotic. Uncertain of prior urine pathology.  She is afebrile.  Past Medical History:  Diagnosis Date  . Anxiety   . Bipolar disorder ([place])   . Chronic UTI   . Depression   . Endometriosis   . HSV (herpes simplex virus) anogenital infection     Patient Active Problem List   Diagnosis Date Noted  . Bipolar depression ([place]) 11/13/2019  . S/P cesarean section 02/17/2018  . Bipolar 1 disorder ([place]) 03/10/2015  . Chronic depression 03/10/2015  . Chronic anxiety 03/10/2015  . Recurrent UTI 03/10/2015  . Allergy history, drug--Cipro 03/10/2015  . Hx of substance abuse--staying at [facility] 03/10/2015  . Back pain 03/10/2015    Past Surgical History:  Procedure Laterality Date  . CESAREAN SECTION N/A 02/17/2018   Procedure: CESAREAN SECTION;  Surgeon: Sanjuana Kava, MD;  Location: [facility];  Service: Obstetrics;  Laterality: N/A;  . DIAGNOSTIC LAPAROSCOPY  2014  . DILATION AND EVACUATION N/A 03/11/2015   Procedure: DILATATION AND EVACUATION;  Surgeon: Waymon Amato, MD;  Location: [facility];  Service: Gynecology;  Laterality: N/A;  . TONSILLECTOMY  2015    OB History    Gravida  2   Para  1   Term  1   Preterm      AB  1   Living  1     SAB  1   TAB      Ectopic      Multiple  0   Live Births  1            Home Medications     Prior to Admission medications   Medication Sig Start Date End Date Taking? Authorizing Provider  brexpiprazole 0.5 MG TABS Take 1 tablet (0.5 mg total) by mouth 2 (two) times daily. 11/16/19   Connye Burkitt, NP  FLUoxetine (PROZAC) 20 MG capsule Take 1 capsule (20 mg total) by mouth daily. 11/17/19   Connye Burkitt, NP  folic acid (FOLVITE) 1 MG tablet Take 1 mg by mouth daily.    [provider]  lamoTRIgine (LAMICTAL) 100 MG tablet Take 1 tablet (100 mg total) by mouth 2 (two) times daily. 11/16/19   Connye Burkitt, NP  LORazepam (ATIVAN) 0.5 MG tablet Take 1 tablet (0.5 mg total) by mouth every 4 (four) hours as needed for anxiety or sleep. 11/16/19   Connye Burkitt, NP  NON FORMULARY CBD OIL    [provider]  Specialty Vitamins Products (MAGNESIUM, AMINO ACID CHELATE,) 133 MG tablet Take 1 tablet by mouth 2 (two) times daily.    [provider]    Family History Family History  Family history unknown: Yes    Social History Social History   Tobacco Use  . Smoking  status: Former Smoker    Packs/day: 1.00    Types: Cigarettes    Quit date: 03/05/2017    Years since quitting: 3.4  . Smokeless tobacco: Current User  Substance Use Topics  . Alcohol use: No  . Drug use: Yes    Types: Heroin, Cocaine, Marijuana    Comment: Clean since Aug 2015     Allergies   Adhesive [tape] and Ciprofloxacin   Review of Systems Review of Systems Pertinent negatives listed in HPI Physical Exam Triage Vital Signs ED Triage Vitals  Enc Vitals Group     BP 08/25/20 1916 106/68     Pulse Rate 08/25/20 1916 61     Resp 08/25/20 1916 17     Temp 08/25/20 1916 98.1 F (36.7 C)     Temp Source 08/25/20 1916 Oral     SpO2 08/25/20 1916 100 %     Weight --      Height --      Head Circumference --      Peak Flow --      Pain Score 08/25/20 1915 6     Pain Loc --      Pain Edu? --      Excl. in [place]? --    No data found.  Updated Vital Signs BP 106/68 (BP  Location: Right Arm)   Pulse 61   Temp 98.1 F (36.7 C) (Oral)   Resp 17   SpO2 100%   Visual Acuity Right Eye Distance:   Left Eye Distance:   Bilateral Distance:    Right Eye Near:   Left Eye Near:    Bilateral Near:     Physical Exam General appearance: alert, well developed, well nourished, cooperative and in no distress Head: Normocephalic, without obvious abnormality, atraumatic Respiratory: Respirations even and unlabored, normal respiratory rate Heart: rate and rhythm normal.   CVA:  no flank pain Extremities: No gross deformities Skin: Skin color, texture, turgor normal. No rashes seen  Psych: Appropriate mood and affect.  UC Treatments / Results  Labs (all labs ordered are listed, but only abnormal results are displayed) Labs Reviewed  POCT URINALYSIS DIPSTICK, ED / UC    EKG   Radiology No results found.  Procedures Procedures (including critical care time)  Medications Ordered in UC Medications - No data to display  Initial Impression / Assessment and Plan / UC Course  I have reviewed the triage vital signs and the nursing notes.  Pertinent labs & imaging results that were available during my care of the patient were reviewed by me and considered in my medical decision making (see chart for details).       UA abnormal and findings consistent with UTI. Empiric antibiotic treatment initiated. Encouraged increase intake of water.Urine culture pending.  ER if symptoms become severe. Follow-up with PCP if symptoms do not completely resolve.  Final Clinical Impressions(s) / UC Diagnoses   Final diagnoses:  Acute cystitis with hematuria   Discharge Instructions   None    ED Prescriptions    Medication Sig Dispense Auth. Provider   nitrofurantoin, macrocrystal-monohydrate, (MACROBID) 100 MG capsule Take 1 capsule (100 mg total) by mouth 2 (two) times daily for 10 days. 20 capsule Scot Jun, FNP     PDMP not reviewed this encounter.    Scot Jun, FNP 08/25/20 1948

## 2020-08-25 NOTE — ED Triage Notes (Signed)
Pt presents with pain during urination and bilateral flank pain since yesterday.

## 2020-08-25 NOTE — Telephone Encounter (Deleted)
Good morning,  I have called and left a message on your voice mail.  They only appointments we have left are virtual appointments today.  You would need to come in and leave a urine specimen before your appointment time.   Please call our office at [phone] and we can get you scheduled.  Thank you, Tammy

## 2020-08-27 LAB — URINE CULTURE
Culture: <10000 — AB
Special Requests: NORMAL

## 2020-08-29 ENCOUNTER — Encounter: Payer: Self-pay | Admitting: Internal Medicine

## 2020-08-29 ENCOUNTER — Telehealth (HOSPITAL_COMMUNITY): Payer: Self-pay | Admitting: Emergency Medicine

## 2020-08-29 MED ORDER — SULFAMETHOXAZOLE-TRIMETHOPRIM 800-160 MG PO TABS: 1.0000 | ORAL_TABLET | Freq: Two times a day (BID) | ORAL | 0 refills | Status: AC

## 2020-08-29 NOTE — Telephone Encounter (Signed)
Left message on machine for patient to return our call to schedule an appointment. 

## 2020-09-10 ENCOUNTER — Inpatient Hospital Stay (HOSPITAL_COMMUNITY)
Admission: AD | Admit: 2020-09-10 | Discharge: 2020-09-13 | DRG: 885 | Disposition: A | Discharge status: Home / Self Care | Payer: 59 | Attending: Psychiatry | Admitting: Psychiatry

## 2020-09-10 ENCOUNTER — Encounter (HOSPITAL_COMMUNITY): Payer: Self-pay | Admitting: Physician Assistant

## 2020-09-10 DIAGNOSIS — R45851 Suicidal ideations: Secondary | ICD-10-CM | POA: Diagnosis present

## 2020-09-10 DIAGNOSIS — F4312 Post-traumatic stress disorder, chronic: Secondary | ICD-10-CM | POA: Diagnosis present

## 2020-09-10 DIAGNOSIS — Z87891 Personal history of nicotine dependence: Secondary | ICD-10-CM

## 2020-09-10 DIAGNOSIS — F319 Bipolar disorder, unspecified: Secondary | ICD-10-CM | POA: Diagnosis not present

## 2020-09-10 DIAGNOSIS — Z87440 Personal history of urinary (tract) infections: Secondary | ICD-10-CM | POA: Diagnosis not present

## 2020-09-10 DIAGNOSIS — F332 Major depressive disorder, recurrent severe without psychotic features: Secondary | ICD-10-CM | POA: Diagnosis present

## 2020-09-10 DIAGNOSIS — Z91048 Other nonmedicinal substance allergy status: Secondary | ICD-10-CM | POA: Diagnosis not present

## 2020-09-10 DIAGNOSIS — G4700 Insomnia, unspecified: Secondary | ICD-10-CM | POA: Diagnosis present

## 2020-09-10 DIAGNOSIS — Z881 Allergy status to other antibiotic agents status: Secondary | ICD-10-CM

## 2020-09-10 DIAGNOSIS — Z20822 Contact with and (suspected) exposure to covid-19: Secondary | ICD-10-CM | POA: Diagnosis present

## 2020-09-10 DIAGNOSIS — F1911 Other psychoactive substance abuse, in remission: Secondary | ICD-10-CM | POA: Diagnosis present

## 2020-09-10 DIAGNOSIS — F314 Bipolar disorder, current episode depressed, severe, without psychotic features: Principal | ICD-10-CM | POA: Diagnosis present

## 2020-09-10 HISTORY — DX: Post-traumatic stress disorder, unspecified: F43.10

## 2020-09-10 LAB — RESPIRATORY PANEL BY RT PCR (FLU A&B, COVID)
Influenza A by PCR: NEGATIVE
Influenza B by PCR: NEGATIVE
SARS Coronavirus 2 by RT PCR: NEGATIVE

## 2020-09-10 MED ORDER — ACETAMINOPHEN 325 MG PO TABS
650.0000 mg | ORAL_TABLET | Freq: Four times a day (QID) | ORAL | Status: DC | PRN
  Administered 2020-09-11 – 2020-09-12 (×2): 650 mg via ORAL
  Filled 2020-09-10 (×2): qty 2

## 2020-09-10 MED ORDER — HYDROXYZINE HCL 25 MG PO TABS
25.0000 mg | ORAL_TABLET | Freq: Three times a day (TID) | ORAL | Status: DC | PRN
  Administered 2020-09-10 – 2020-09-12 (×2): 25 mg via ORAL
  Filled 2020-09-10 (×2): qty 1

## 2020-09-10 MED ORDER — MAGNESIUM HYDROXIDE 400 MG/5ML PO SUSP: 30.0000 mL | Freq: Every day | ORAL | Status: DC | PRN

## 2020-09-10 MED ORDER — TRAZODONE HCL 50 MG PO TABS
50.0000 mg | ORAL_TABLET | Freq: Every evening | ORAL | Status: DC | PRN
  Administered 2020-09-10: 50 mg via ORAL
  Filled 2020-09-10 (×2): qty 1

## 2020-09-10 MED ORDER — ALUM & MAG HYDROXIDE-SIMETH 200-200-20 MG/5ML PO SUSP: 30.0000 mL | ORAL | Status: DC | PRN
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
## 2020-09-11 ENCOUNTER — Other Ambulatory Visit: Payer: Self-pay

## 2020-09-11 DIAGNOSIS — F4312 Post-traumatic stress disorder, chronic: Secondary | ICD-10-CM

## 2020-09-11 LAB — CBC
HCT: 37.2 % (ref 36.0–46.0)
Hemoglobin: 12.7 g/dL (ref 12.0–15.0)
MCH: 31.3 pg (ref 26.0–34.0)
MCHC: 34.1 g/dL (ref 30.0–36.0)
MCV: 91.6 fL (ref 80.0–100.0)
Platelets: 307 10*3/uL (ref 150–400)
RBC: 4.06 MIL/uL (ref 3.87–5.11)
RDW: 12.3 % (ref 11.5–15.5)
WBC: 7.2 10*3/uL (ref 4.0–10.5)
nRBC: 0 % (ref 0.0–0.2)

## 2020-09-11 LAB — LIPID PANEL
Cholesterol: 137 mg/dL (ref 0–200)
HDL: 42 mg/dL (ref >40)
LDL Cholesterol: 87 mg/dL (ref 0–99)
Total CHOL/HDL Ratio: 3.3 RATIO
Triglycerides: 40 mg/dL (ref <150)
VLDL: 8 mg/dL (ref 0–40)

## 2020-09-11 LAB — HEMOGLOBIN A1C
Hgb A1c MFr Bld: 4.6 % — ABNORMAL LOW (ref 4.8–5.6)
Mean Plasma Glucose: 85.32 mg/dL

## 2020-09-11 LAB — RAPID URINE DRUG SCREEN, HOSP PERFORMED
Amphetamines: NOT DETECTED
Barbiturates: NOT DETECTED
Benzodiazepines: NOT DETECTED
Cocaine: NOT DETECTED
Opiates: NOT DETECTED
Tetrahydrocannabinol: NOT DETECTED

## 2020-09-11 LAB — COMPREHENSIVE METABOLIC PANEL
ALT: 13 U/L (ref 0–44)
AST: 14 U/L — ABNORMAL LOW (ref 15–41)
Albumin: 4 g/dL (ref 3.5–5.0)
Alkaline Phosphatase: 40 U/L (ref 38–126)
Anion gap: 7 (ref 5–15)
BUN: 11 mg/dL (ref 6–20)
CO2: 24 mmol/L (ref 22–32)
Calcium: 8.6 mg/dL — ABNORMAL LOW (ref 8.9–10.3)
Chloride: 107 mmol/L (ref 98–111)
Creatinine, Ser: 0.66 mg/dL (ref 0.44–1.00)
GFR, Estimated: >60 mL/min (ref >60)
Glucose, Bld: 95 mg/dL (ref 70–99)
Potassium: 4 mmol/L (ref 3.5–5.1)
Sodium: 138 mmol/L (ref 135–145)
Total Bilirubin: 0.7 mg/dL (ref 0.3–1.2)
Total Protein: 6.7 g/dL (ref 6.5–8.1)

## 2020-09-11 LAB — TSH: TSH: 0.784 u[IU]/mL (ref 0.350–4.500)

## 2020-09-11 LAB — ETHANOL: Alcohol, Ethyl (B): <10 mg/dL (ref <10)

## 2020-09-11 MED ORDER — NICOTINE 14 MG/24HR TD PT24
14.0000 mg | MEDICATED_PATCH | Freq: Every day | TRANSDERMAL | Status: DC
  Filled 2020-09-11 (×3): qty 1

## 2020-09-11 MED ORDER — RISPERIDONE 0.5 MG PO TABS
0.5000 mg | ORAL_TABLET | Freq: Two times a day (BID) | ORAL | Status: DC
  Administered 2020-09-11 – 2020-09-13 (×4): 0.5 mg via ORAL
  Filled 2020-09-11 (×6): qty 1

## 2020-09-11 MED ORDER — LAMOTRIGINE 100 MG PO TABS
100.0000 mg | ORAL_TABLET | Freq: Two times a day (BID) | ORAL | Status: DC
  Administered 2020-09-11 – 2020-09-13 (×4): 100 mg via ORAL
  Filled 2020-09-11 (×8): qty 1

## 2020-09-11 MED ORDER — GABAPENTIN 100 MG PO CAPS
100.0000 mg | ORAL_CAPSULE | Freq: Three times a day (TID) | ORAL | Status: DC
  Administered 2020-09-11 – 2020-09-13 (×6): 100 mg via ORAL
  Filled 2020-09-11 (×10): qty 1

## 2020-09-11 MED ORDER — PRAZOSIN HCL 2 MG PO CAPS
2.0000 mg | ORAL_CAPSULE | Freq: Every day | ORAL | Status: DC
  Filled 2020-09-11 (×2): qty 1

## 2020-09-11 MED ORDER — FLUOXETINE HCL 10 MG PO CAPS
30.0000 mg | ORAL_CAPSULE | Freq: Every day | ORAL | Status: DC
  Administered 2020-09-11 – 2020-09-13 (×3): 30 mg via ORAL
  Filled 2020-09-11 (×5): qty 3

## 2020-09-11 MED ORDER — NICOTINE POLACRILEX 2 MG MT GUM
2.0000 mg | CHEWING_GUM | OROMUCOSAL | Status: DC | PRN
  Administered 2020-09-11: 2 mg via ORAL

## 2020-09-11 NOTE — Progress Notes (Signed)
D:  Patient denied SI and HI, contracts for safety.  Denied A/V hallucinations.  Denied pain. A:  Medications administered per MD orders.  Emotional support and encouragement given patient. R:  Safety maintained with 15 minute checks.  

## 2020-09-11 NOTE — Plan of Care (Addendum)
Patient has been in her room most of the day,not attending groups.
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
## 2020-09-11 NOTE — Progress Notes (Signed)
   09/11/20 2030  Psych Admission Type (Psych Patients Only)  Admission Status Voluntary  Psychosocial Assessment  Patient Complaints None  Eye Contact Fair  Facial Expression Other (Comment) (spoke with patient upon awakening)  Affect Depressed  Speech Logical/coherent  Interaction Assertive  Motor Activity Other (Comment) (WDL)  Appearance/Hygiene UTA  Behavior Characteristics Cooperative;Appropriate to situation  Mood Depressed  Thought Process  Coherency WDL  Content WDL  Delusions None reported or observed  Perception WDL  Hallucination None reported or observed  Judgment WDL  Confusion None  Danger to Self  Current suicidal ideation? Denies  Danger to Others  Danger to Others None reported or observed  D: Patient presents with pleasant affect upon awakening. Patient denies SI/HI at this time. Patient also denies AH/VH at this time. Patient aware to notify staff or RN if this changes.  A:  Provided positive reinforcement and encouragement.  R: Patient cooperative and receptive to efforts.

## 2020-09-11 NOTE — BHH Group Notes (Signed)
Adult Psychoeducational Group Not Date:  09/11/2020 Time:  0900-1045 Group Topic/Focus: PROGRESSIVE RELAXATION. A group where deep breathing is taught and tensing and relaxation muscle groups is used. Imagery is used as well.  Pts are asked to imagine 3 pillars that hold them up when they are not able to hold themselves up.  Participation Level:  Did not attendA Paulino Rily 09/11/2020`

## 2020-09-11 NOTE — BHH Group Notes (Signed)
Pt did not attend wrap up group this evening. Pt was in bed sleeping.  
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
## 2020-09-11 NOTE — Plan of Care (Signed)
Nurse discussed anxiety, depression and coping skills with patient.  

## 2020-09-11 NOTE — BHH Group Notes (Signed)
[facility] Group Notes: (Clinical Social Work)   09/11/2020      Type of Therapy:  Group Therapy   Participation Level:  Did Not Attend - was invited both individually by MHT and by overhead announcement, chose not to attend.   Selmer Dominion, [name] 09/11/2020, 2:38 PM
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
## 2020-09-12 DIAGNOSIS — F319 Bipolar disorder, unspecified: Secondary | ICD-10-CM

## 2020-09-12 MED ORDER — PRAZOSIN HCL 2 MG PO CAPS
3.0000 mg | ORAL_CAPSULE | Freq: Every day | ORAL | Status: DC
  Administered 2020-09-12: 3 mg via ORAL
  Filled 2020-09-12 (×2): qty 1

## 2020-09-12 NOTE — Progress Notes (Signed)
Recreation Therapy Notes  Date:  11.8.21 Time: 0930 Location: 300 Hall Dayroom  Group Topic: Stress Management  Goal Area(s) Addresses:  Patient will identify positive stress management techniques. Patient will identify benefits of using stress management post d/c.  Intervention: Stress Management  Activity: Meditation.  LRT played a meditation that focused on being resilient in the face of adversity.  Patients were to listen and follow as meditation was played to fully engage in activity.    Education:  Stress Management, Discharge Planning.   Education Outcome: Acknowledges Education  Clinical Observations/Feedback: Pt did not attend group session.    Victorino Sparrow, LRT/CTRS         Victorino Sparrow A 09/12/2020 11:47 AM

## 2020-09-12 NOTE — [facility] Group Notes (Signed)
Occupational Therapy Group Note Date: 09/12/2020 Group Topic/Focus: Distress Tolerance  Group Description: Group encouraged increased engagement and participation through discussion focused on distress tolerance. Patients were given a handout on distress tolerance skills, specifically focused on healthy distractions. Group was introduced to DBT skills acronym ACCEPTS and looked at healthy distractions as coping skills including use of Activities, Contributions, Comparisons, Emotions, Pushing away, Thoughts, and Sensations. Patients were encouraged to identify 1-2 healthy distractions they could use to manage their mental health.   Participation Level: Patient did not attend OT group session despite personal invitation. Pt was asleep in bed.     Plan: Continue to engage patient in OT groups 2 - 3x/week.   09/12/2020  Ponciano Ort, MOT, OTR/L

## 2020-09-12 NOTE — Tx Team (Signed)
Interdisciplinary Treatment and Diagnostic Plan Update  09/12/2020 Time of Session: 9:50am Karen [name] MRN: [id number]  Principal Diagnosis: Bipolar depression ([place])  Secondary Diagnoses: Principal Problem:   Bipolar depression ([place]) Active Problems:   Hx of substance abuse--staying at [facility]   MDD (major depressive disorder), recurrent episode, severe ([place])   Chronic post-traumatic stress disorder (PTSD)   Current Medications:  Current Facility-Administered Medications  Medication Dose Route Frequency Provider Last Rate Last Admin  . acetaminophen (TYLENOL) tablet 650 mg  650 mg Oral Q6H PRN Nwoko, Uchenna E, PA   650 mg at 09/11/20 0743  . alum & mag hydroxide-simeth (MAALOX/MYLANTA) 200-200-20 MG/5ML suspension 30 mL  30 mL Oral Q4H PRN Nwoko, Uchenna E, PA      . FLUoxetine (PROZAC) capsule 30 mg  30 mg Oral Daily Damita Dunnings B, MD   30 mg at 09/12/20 4580  . gabapentin (NEURONTIN) capsule 100 mg  100 mg Oral TID Damita Dunnings B, MD   100 mg at 09/12/20 9983  . hydrOXYzine (ATARAX/VISTARIL) tablet 25 mg  25 mg Oral TID PRN Nwoko, Uchenna E, PA   25 mg at 09/10/20 2357  . lamoTRIgine (LAMICTAL) tablet 100 mg  100 mg Oral BID Damita Dunnings B, MD   100 mg at 09/12/20 3825  . magnesium hydroxide (MILK OF MAGNESIA) suspension 30 mL  30 mL Oral Daily PRN Nwoko, Uchenna E, PA      . nicotine polacrilex (NICORETTE) gum 2 mg  2 mg Oral PRN Damita Dunnings B, MD   2 mg at 09/11/20 1736  . prazosin (MINIPRESS) capsule 2 mg  2 mg Oral QHS McQuilla, Jai B, MD      . risperiDONE (RISPERDAL) tablet 0.5 mg  0.5 mg Oral BID Damita Dunnings B, MD   0.5 mg at 09/12/20 0823  . traZODone (DESYREL) tablet 50 mg  50 mg Oral QHS PRN Nwoko, Uchenna E, PA   50 mg at 09/10/20 2357   PTA Medications: Medications Prior to Admission  Medication Sig Dispense Refill Last Dose  . folic acid (FOLVITE) 1 MG tablet Take 1 mg by mouth daily.   Past Week at Unknown time  . Specialty Vitamins Products  (MAGNESIUM, AMINO ACID CHELATE,) 133 MG tablet Take 1 tablet by mouth 2 (two) times daily.   09/09/2020 at Unknown time  . FLUoxetine (PROZAC) 40 MG capsule Take 40 mg by mouth daily.     Marland Kitchen lamoTRIgine (LAMICTAL) 100 MG tablet Take 1 tablet (100 mg total) by mouth 2 (two) times daily. (Patient not taking: Reported on 09/11/2020) 60 tablet 0 Not Taking at Unknown time  . lamoTRIgine (LAMICTAL) 150 MG tablet Take 300 mg by mouth at bedtime.     Marland Kitchen LORazepam (ATIVAN) 0.5 MG tablet Take 1 tablet (0.5 mg total) by mouth every 4 (four) hours as needed for anxiety or sleep. 10 tablet 0 Unknown at Unknown time  . prazosin (MINIPRESS) 1 MG capsule Take 2 mg by mouth at bedtime.     . valACYclovir (VALTREX) 1000 MG tablet Take 1,000 mg by mouth daily.       Patient Stressors: Arts development officer issue Marital or family conflict  Patient Strengths: Ability for insight Average or above average intelligence Motivation for treatment/growth  Treatment Modalities: Medication Management, Group therapy, Case management,  1 to 1 session with clinician, Psychoeducation, Recreational therapy.   Physician Treatment Plan for Primary Diagnosis: Bipolar depression ([place]) Long Term Goal(s): Improvement in symptoms so as ready for discharge  Improvement in symptoms so as ready for discharge   Short Term Goals: Ability to identify changes in lifestyle to reduce recurrence of condition will improve Ability to identify and develop effective coping behaviors will improve Ability to identify triggers associated with substance abuse/mental health issues will improve Ability to maintain clinical measurements within normal limits will improve  Medication Management: Evaluate patient's response, side effects, and tolerance of medication regimen.  Therapeutic Interventions: 1 to 1 sessions, Unit Group sessions and Medication administration.  Evaluation of Outcomes: Progressing  Physician Treatment Plan for  Secondary Diagnosis: Principal Problem:   Bipolar depression ([place]) Active Problems:   Hx of substance abuse--staying at [name]   MDD (major depressive disorder), recurrent episode, severe ([place])   Chronic post-traumatic stress disorder (PTSD)  Long Term Goal(s): Improvement in symptoms so as ready for discharge Improvement in symptoms so as ready for discharge   Short Term Goals: Ability to identify changes in lifestyle to reduce recurrence of condition will improve Ability to identify and develop effective coping behaviors will improve Ability to identify triggers associated with substance abuse/mental health issues will improve Ability to maintain clinical measurements within normal limits will improve     Medication Management: Evaluate patient's response, side effects, and tolerance of medication regimen.  Therapeutic Interventions: 1 to 1 sessions, Unit Group sessions and Medication administration.  Evaluation of Outcomes: Progressing   RN Treatment Plan for Primary Diagnosis: Bipolar depression ([place]) Long Term Goal(s): Knowledge of disease and therapeutic regimen to maintain health will improve  Short Term Goals: Ability to remain free from injury will improve, Ability to demonstrate self-control, Ability to participate in decision making will improve, Ability to verbalize feelings will improve and Ability to disclose and discuss suicidal ideas  Medication Management: RN will administer medications as ordered by provider, will assess and evaluate patient's response and provide education to patient for prescribed medication. RN will report any adverse and/or side effects to prescribing provider.  Therapeutic Interventions: 1 on 1 counseling sessions, Psychoeducation, Medication administration, Evaluate responses to treatment, Monitor vital signs and CBGs as ordered, Perform/monitor CIWA, COWS, AIMS and Fall Risk screenings as ordered, Perform wound care treatments as  ordered.  Evaluation of Outcomes: Progressing   [name] Treatment Plan for Primary Diagnosis: Bipolar depression ([place]) Long Term Goal(s): Safe transition to appropriate next level of care at discharge, Engage patient in therapeutic group addressing interpersonal concerns.  Short Term Goals: Engage patient in aftercare planning with referrals and resources, Increase social support, Increase emotional regulation, Facilitate acceptance of mental health diagnosis and concerns and Identify triggers associated with mental health/substance abuse issues  Therapeutic Interventions: Assess for all discharge needs, 1 to 1 time with Social worker, Explore available resources and support systems, Assess for adequacy in community support network, Educate family and significant other(s) on suicide prevention, Complete Psychosocial Assessment, Interpersonal group therapy.  Evaluation of Outcomes: Progressing   Progress in Treatment: Attending groups: No. Participating in groups: No. Taking medication as prescribed: Yes. Toleration medication: Yes. Family/Significant other contact made: Yes, individual(s) contacted:  Sponser  Patient understands diagnosis: Yes. Discussing patient identified problems/goals with staff: Yes. Medical problems stabilized or resolved: Yes. Denies suicidal/homicidal ideation: Yes. Issues/concerns per patient self-inventory: No.   New problem(s) identified: No, Describe:  None  New Short Term/Long Term Goal(s): medication stabilization, elimination of SI thoughts, development of comprehensive mental wellness plan.   Patient Goals:  "To get my medications changed and to get a new psychiatrist"   Discharge Plan or Barriers: Patient  recently admitted. CSW will continue to follow and assess for appropriate referrals and possible discharge planning.   Reason for Continuation of Hospitalization: Anxiety Depression Medication stabilization Suicidal ideation  Estimated Length  of Stay: 3 to 5 days   Attendees: Patient: Karen [name] 09/12/2020   Physician: Lala Lund, MD 09/12/2020   Nursing:  09/12/2020   RN Care Manager: 09/12/2020   Social Worker: Verdis Frederickson, [place] 09/12/2020   Recreational Therapist:  09/12/2020   Other:  09/12/2020   Other:  09/12/2020   Other: 09/12/2020      Scribe for Treatment Team: Darleen Crocker, LCSWA 09/12/2020 9:56 AM

## 2020-09-12 NOTE — Progress Notes (Signed)
D:  Patient denied SI and HI, contracts for safety.  Denied A/V hallucinations.  Denied pain. A:  Medications administered per MD orders.  Emotional support and encouragements given patient. R:  Denied SI and HI, contracts for safety.  Denied A/V hallucinations.  Safety maintained with 15 minute checks.

## 2020-09-12 NOTE — [facility] Group Notes (Signed)
Adult Psychoeducational Group Note  Date:  09/12/2020 Time:  4:18 PM  Group Topic/Focus:  Wellness Toolbox:   The focus of this group is to discuss various aspects of wellness, balancing those aspects and exploring ways to increase the ability to experience wellness.  Patients will create a wellness toolbox for use upon discharge.  Participation Level:  Active  Participation Quality:  Appropriate  Affect:  Appropriate  Cognitive:  Appropriate  Insight: Appropriate  Engagement in Group:  Engaged  Modes of Intervention:  Education  Additional Comments:  Patient attended Wellness group and participated.   Halsey Hammen W Nickoli Bagheri 27/04/1469, 4:18 PM

## 2020-09-12 NOTE — Plan of Care (Signed)
Nurse discussed anxiety, depression and coping skills with patient.  

## 2020-09-12 NOTE — Progress Notes (Signed)
[facility] MD Progress Note  09/12/2020 11:47 AM Karen [name]  MRN:  [id number] Subjective:  Patient reports that she slept well and she continues to have a decent appetite. Patient reports that she received trazodone yesterday, she does not wish to take this medication as it makes her extremely hypotensive and over sedates her. Patient reports that her mood has improved today. Patient missed 1 dose of risperdal and her Prazosin due to sleeping yesterday. Patient reports that she would like her Prazosin increased because she reports that she often has nightmares despite taking the medication. She attended one group yesterday and plans to attend more. Patient does not endorse SI, HI< nor AVH and contracts for safety. Principal Problem: Bipolar depression ([place]) Diagnosis: Principal Problem:   Bipolar depression ([place]) Active Problems:   Hx of substance abuse--staying at [facility]   MDD (major depressive disorder), recurrent episode, severe ([place])   Chronic post-traumatic stress disorder (PTSD)  Total Time spent with patient: 15 minutes  Past Psychiatric History: See H&P  Past Medical History:  Past Medical History:  Diagnosis Date  . Anxiety   . Bipolar disorder ([place])   . Chronic UTI   . Depression   . Endometriosis   . HSV (herpes simplex virus) anogenital infection   . PTSD (post-traumatic stress disorder)     Past Surgical History:  Procedure Laterality Date  . CESAREAN SECTION N/A 02/17/2018   Procedure: CESAREAN SECTION;  Surgeon: Sanjuana Kava, MD;  Location: [place];  Service: Obstetrics;  Laterality: N/A;  . DIAGNOSTIC LAPAROSCOPY  2014  . DILATION AND EVACUATION N/A 03/11/2015   Procedure: DILATATION AND EVACUATION;  Surgeon: Waymon Amato, MD;  Location: [facility];  Service: Gynecology;  Laterality: N/A;  . TONSILLECTOMY  2015   Family History:  Family History  Family history unknown: Yes   Family Psychiatric  History: See H&P Social History:  Social History    Substance and Sexual Activity  Alcohol Use No     Social History   Substance and Sexual Activity  Drug Use Yes  . Types: Heroin, Cocaine, Marijuana   Comment: Clean since Aug 2015    Social History   Socioeconomic History  . Marital status: Married    Spouse name: Nasra Counce  . Number of children: 1  . Years of education: Not on file  . Highest education level: Some college, no degree  Occupational History  . Not on file  Tobacco Use  . Smoking status: Former Smoker    Packs/day: 1.00    Types: Cigarettes    Quit date: 03/05/2017    Years since quitting: 3.5  . Smokeless tobacco: Current User  Substance and Sexual Activity  . Alcohol use: No  . Drug use: Yes    Types: Heroin, Cocaine, Marijuana    Comment: Clean since Aug 2015  . Sexual activity: Not Currently  Other Topics Concern  . Not on file  Social History Narrative  . Not on file   Social Determinants of Health   Financial Resource Strain:   . Difficulty of Paying Living Expenses: Not on file  Food Insecurity:   . Worried About Charity fundraiser in the Last Year: Not on file  . Ran Out of Food in the Last Year: Not on file  Transportation Needs:   . Lack of Transportation (Medical): Not on file  . Lack of Transportation (Non-Medical): Not on file  Physical Activity:   . Days of Exercise per Week: Not on file  .  Minutes of Exercise per Session: Not on file  Stress:   . Feeling of Stress : Not on file  Social Connections:   . Frequency of Communication with Friends and Family: Not on file  . Frequency of Social Gatherings with Friends and Family: Not on file  . Attends Religious Services: Not on file  . Active Member of Clubs or Organizations: Not on file  . Attends Archivist Meetings: Not on file  . Marital Status: Not on file   Additional Social History:    Pain Medications: None Prescriptions: See MAR Over the Counter: None History of alcohol / drug use?: Yes Longest period of  sobriety (when/how long): Has been sober for the last 7 years                    Sleep: Fair  Appetite:  Good  Current Medications: Current Facility-Administered Medications  Medication Dose Route Frequency Provider Last Rate Last Admin  . acetaminophen (TYLENOL) tablet 650 mg  650 mg Oral Q6H PRN Nwoko, Uchenna E, PA   650 mg at 09/11/20 0743  . alum & mag hydroxide-simeth (MAALOX/MYLANTA) 200-200-20 MG/5ML suspension 30 mL  30 mL Oral Q4H PRN Nwoko, Uchenna E, PA      . FLUoxetine (PROZAC) capsule 30 mg  30 mg Oral Daily Damita Dunnings B, MD   30 mg at 09/12/20 7494  . gabapentin (NEURONTIN) capsule 100 mg  100 mg Oral TID Damita Dunnings B, MD   100 mg at 09/12/20 4967  . hydrOXYzine (ATARAX/VISTARIL) tablet 25 mg  25 mg Oral TID PRN Nwoko, Uchenna E, PA   25 mg at 09/10/20 2357  . lamoTRIgine (LAMICTAL) tablet 100 mg  100 mg Oral BID Damita Dunnings B, MD   100 mg at 09/12/20 5916  . magnesium hydroxide (MILK OF MAGNESIA) suspension 30 mL  30 mL Oral Daily PRN Nwoko, Uchenna E, PA      . nicotine polacrilex (NICORETTE) gum 2 mg  2 mg Oral PRN Damita Dunnings B, MD   2 mg at 09/11/20 1736  . prazosin (MINIPRESS) capsule 2 mg  2 mg Oral QHS Kajah Santizo B, MD      . risperiDONE (RISPERDAL) tablet 0.5 mg  0.5 mg Oral BID Damita Dunnings B, MD   0.5 mg at 09/12/20 0823  . traZODone (DESYREL) tablet 50 mg  50 mg Oral QHS PRN Nwoko, Uchenna E, PA   50 mg at 09/10/20 2357    Lab Results:  Results for orders placed or performed during the hospital encounter of 09/10/20 (from the past 48 hour(s))  Respiratory Panel by RT PCR (Flu A&B, Covid) - Nasopharyngeal Swab     Status: None   Collection Time: 09/10/20  9:25 PM   Specimen: Nasopharyngeal Swab  Result Value Ref Range   SARS Coronavirus 2 by RT PCR NEGATIVE NEGATIVE    Comment: (NOTE) SARS-CoV-2 target nucleic acids are NOT DETECTED.  The SARS-CoV-2 RNA is generally detectable in upper respiratoy specimens during the acute phase of  infection. The lowest concentration of SARS-CoV-2 viral copies this assay can detect is 131 copies/mL. A negative result does not preclude SARS-Cov-2 infection and should not be used as the sole basis for treatment or other patient management decisions. A negative result may occur with  improper specimen collection/handling, submission of specimen other than nasopharyngeal swab, presence of viral mutation(s) within the areas targeted by this assay, and inadequate number of viral copies (<131 copies/mL). A negative result  must be combined with clinical observations, patient history, and epidemiological information. The expected result is Negative.  Fact Sheet for Patients:  PinkCheek.be  Fact Sheet for Healthcare Providers:  GravelBags.it  This test is no t yet approved or cleared by the [place] FDA and  has been authorized for detection and/or diagnosis of SARS-CoV-2 by FDA under an Emergency Use Authorization (EUA). This EUA will remain  in effect (meaning this test can be used) for the duration of the COVID-19 declaration under Section 564(b)(1) of the Act, 21 U.S.C. section 360bbb-3(b)(1), unless the authorization is terminated or revoked sooner.     Influenza A by PCR NEGATIVE NEGATIVE   Influenza B by PCR NEGATIVE NEGATIVE    Comment: (NOTE) The Xpert Xpress SARS-CoV-2/FLU/RSV assay is intended as an aid in  the diagnosis of influenza from Nasopharyngeal swab specimens and  should not be used as a sole basis for treatment. Nasal washings and  aspirates are unacceptable for Xpert Xpress SARS-CoV-2/FLU/RSV  testing.  Fact Sheet for Patients: PinkCheek.be  Fact Sheet for Healthcare Providers: GravelBags.it  This test is not yet approved or cleared by the [place] FDA and  has been authorized for detection and/or diagnosis of SARS-CoV-2 by  FDA  under an Emergency Use Authorization (EUA). This EUA will remain  in effect (meaning this test can be used) for the duration of the  Covid-19 declaration under Section 564(b)(1) of the Act, 21  U.S.C. section 360bbb-3(b)(1), unless the authorization is  terminated or revoked. Performed at [facility], [place] [address], [place], [place] [address]   Comprehensive metabolic panel     Status: Abnormal   Collection Time: 09/11/20  6:50 AM  Result Value Ref Range   Sodium 138 135 - 145 mmol/L   Potassium 4.0 3.5 - 5.1 mmol/L   Chloride 107 98 - 111 mmol/L   CO2 24 22 - 32 mmol/L   Glucose, Bld 95 70 - 99 mg/dL    Comment: Glucose reference range applies only to samples taken after fasting for at least 8 hours.   BUN 11 6 - 20 mg/dL   Creatinine, Ser 0.66 0.44 - 1.00 mg/dL   Calcium 8.6 (L) 8.9 - 10.3 mg/dL   Total Protein 6.7 6.5 - 8.1 g/dL   Albumin 4.0 3.5 - 5.0 g/dL   AST 14 (L) 15 - 41 U/L   ALT 13 0 - 44 U/L   Alkaline Phosphatase 40 38 - 126 U/L   Total Bilirubin 0.7 0.3 - 1.2 mg/dL   GFR, Estimated >60 >60 mL/min    Comment: (NOTE) Calculated using the CKD-EPI Creatinine Equation (2021)    Anion gap 7 5 - 15    Comment: Performed at [facility], [place] [address] [place], [place] [address]  Hemoglobin A1c     Status: Abnormal   Collection Time: 09/11/20  6:50 AM  Result Value Ref Range   Hgb A1c MFr Bld 4.6 (L) 4.8 - 5.6 %    Comment: (NOTE) Pre diabetes:          5.7%-6.4%  Diabetes:              >6.4%  Glycemic control for   <7.0% adults with diabetes    Mean Plasma Glucose 85.32 mg/dL    Comment: Performed at [place] [address], [place], [place] [address]  Ethanol     Status: None   Collection Time: 09/11/20  6:50 AM  Result Value  Ref Range   Alcohol, Ethyl (B) <10 <10 mg/dL    Comment: (NOTE) Lowest detectable limit for serum alcohol is 10 mg/dL.  For medical purposes only. Performed at [facility], [place] [address] [place], [place] [address]   CBC     Status: None   Collection Time: 09/11/20  6:50 AM  Result Value Ref Range   WBC 7.2 4.0 - 10.5 K/uL   RBC 4.06 3.87 - 5.11 MIL/uL   Hemoglobin 12.7 12.0 - 15.0 g/dL   HCT 37.2 36 - 46 %   MCV 91.6 80.0 - 100.0 fL   MCH 31.3 26.0 - 34.0 pg   MCHC 34.1 30.0 - 36.0 g/dL   RDW 12.3 11.5 - 15.5 %   Platelets 307 150 - 400 K/uL   nRBC 0.0 0.0 - 0.2 %    Comment: Performed at [facility], [place] [address], [place], [place] [address]  Lipid panel     Status: None   Collection Time: 09/11/20  6:50 AM  Result Value Ref Range   Cholesterol 137 0 - 200 mg/dL   Triglycerides 40 <150 mg/dL   HDL 42 >40 mg/dL   Total CHOL/HDL Ratio 3.3 RATIO   VLDL 8 0 - 40 mg/dL   LDL Cholesterol 87 0 - 99 mg/dL    Comment:        Total Cholesterol/HDL:CHD Risk Coronary Heart Disease Risk Table                     Men   Women  1/2 Average Risk   3.4   3.3  Average Risk       5.0   4.4  2 X Average Risk   9.6   7.1  3 X Average Risk  23.4   11.0        Use the calculated Patient Ratio above and the CHD Risk Table to determine the patient's CHD Risk.        ATP III CLASSIFICATION (LDL):  <100     mg/dL   Optimal  100-129  mg/dL   Near or Above                    Optimal  130-159  mg/dL   Borderline  160-189  mg/dL   High  >190     mg/dL   Very High Performed at [facility] [address] [place], [place] [address]   TSH     Status: None   Collection Time: 09/11/20  6:50 AM  Result Value Ref Range   TSH 0.784 0.350 - 4.500 uIU/mL    Comment: Performed by a 3rd Generation assay with a functional sensitivity of <=0.01 uIU/mL. Performed at [facility], [place] [address] [place], [place] [address]   Urine rapid drug screen (hosp performed)not at [facility]     Status: None   Collection Time: 09/11/20  8:13 AM  Result Value Ref Range   Opiates NONE DETECTED NONE DETECTED    Cocaine NONE DETECTED NONE DETECTED   Benzodiazepines NONE DETECTED NONE DETECTED   Amphetamines NONE DETECTED NONE DETECTED   Tetrahydrocannabinol NONE DETECTED NONE DETECTED   Barbiturates NONE DETECTED NONE DETECTED    Comment: (NOTE) DRUG SCREEN FOR MEDICAL PURPOSES ONLY.  IF CONFIRMATION IS NEEDED FOR ANY PURPOSE, NOTIFY LAB WITHIN 5 DAYS.  LOWEST DETECTABLE LIMITS FOR URINE DRUG SCREEN Drug Class  Cutoff (ng/mL) Amphetamine and metabolites    1000 Barbiturate and metabolites    200 Benzodiazepine                 737 Tricyclics and metabolites     300 Opiates and metabolites        300 Cocaine and metabolites        300 THC                            50 Performed at [facility], [place] [address], [place], [place] [address]     Blood Alcohol level:  Lab Results  Component Value Date   [facility] <10 09/11/2020   ETH <10 94/85/4627    Metabolic Disorder Labs: Lab Results  Component Value Date   HGBA1C 4.6 (L) 09/11/2020   MPG 85.32 09/11/2020   No results found for: PROLACTIN Lab Results  Component Value Date   CHOL 137 09/11/2020   TRIG 40 09/11/2020   HDL 42 09/11/2020   CHOLHDL 3.3 09/11/2020   VLDL 8 09/11/2020   LDLCALC 87 09/11/2020   LDLCALC 90 11/13/2019    Physical Findings: AIMS: Facial and Oral Movements Muscles of Facial Expression: None, normal Lips and Perioral Area: None, normal Jaw: None, normal Tongue: None, normal,Extremity Movements Upper (arms, wrists, hands, fingers): None, normal Lower (legs, knees, ankles, toes): None, normal, Trunk Movements Neck, shoulders, hips: None, normal, Overall Severity Severity of abnormal movements (highest score from questions above): None, normal Incapacitation due to abnormal movements: None, normal Patient's awareness of abnormal movements (rate only patient's report): No Awareness, Dental Status Current problems with teeth and/or dentures?: No Does patient usually  wear dentures?: No  CIWA:    COWS:     Musculoskeletal: Strength & Muscle Tone: within normal limits Gait & Station: normal Patient leans: N/A  Psychiatric Specialty Exam: Physical Exam HENT:     Head: Normocephalic and atraumatic.  Eyes:     Extraocular Movements: Extraocular movements intact.     Conjunctiva/sclera: Conjunctivae normal.  Pulmonary:     Effort: Pulmonary effort is normal.  Neurological:     Mental Status: She is alert.     Review of Systems  Respiratory: Negative for shortness of breath.   Cardiovascular: Negative for chest pain.  Gastrointestinal: Negative for abdominal pain.    Blood pressure 93/62, pulse (!) 120, temperature 98.4 F (36.9 C), temperature source Oral, resp. rate 16, height 5\' 1"  (1.549 m), weight 45.4 kg.Body mass index is 18.89 kg/m.  General Appearance: Fairly Groomed  Eye Contact:  Fair  Speech:  Clear and Coherent  Volume:  Normal  Mood:  "better than yesterday"  Affect:  Flat  Thought Process:  Coherent  Orientation:  NA  Thought Content:  Logical  Suicidal Thoughts:  No  Homicidal Thoughts:  No  Memory:  NA  Judgement:  Fair  Insight:  Fair  Psychomotor Activity:  Normal  Concentration:  Concentration: Fair and Attention Span: Fair  Recall:  Good  Fund of Knowledge:  Good  Language:  Good  Akathisia:  No  Handed:  Right  AIMS (if indicated):     Assets:  Communication Skills Desire for Improvement Resilience  ADL's:  Intact  Cognition:  WNL  Sleep:  Number of Hours: 5.75   Mrs. Vice presented with depressed mood with passive suicidal ideation.  Bipolar depression ([place])   Patient mood is improving.   Scheduled medications - Fluoxetine 30mg  -  Lamictal 100mg  BID - Risperdal 0.5mg  BID     Hx of substance abuse--staying at [facility]   MDD (major depressive disorder), recurrent episode, severe ([facility])   Chronic post-traumatic stress disorder (PTSD) Patient reports an extensive hx of trauma that has led  her to have persistent negative emotional state, nightmares, avoidance, and anxiety. Patient reports that she has not used illicit substances or EtOH in 7 years. Patient was discharged 11/2019 on Ativan; however with her EtOH hx and 7 years of sobriety it would be best to avoid benzodiazepines for anxiety. Patient reports that her Prazosin is not working, but she would like to try increasing the dosage.   Scheduled - Increase Prazosin 2mg  to 3mg  QHS - Gabapentin 100mg  TID - nicotine patch 14 mg  PRN - Discontinue Trazodone 50mg  QHS - Milk of Mag 29mL - Maalox 89mL - Tylenol 650mg  q 6h  Treatment Plan Summary: Daily contact with patient to assess and evaluate symptoms and progress in treatment  Freida Busman, MD 09/12/2020, 11:47 AM

## 2020-09-12 NOTE — BHH Group Notes (Signed)
[facility] [name] Group Therapy  09/12/2020 2:27 PM  Type of Therapy:  Group Therapy  Participation Level:  Did Not Attend   Mliss Fritz 09/12/2020, 2:27 PM

## 2020-09-12 NOTE — BHH Group Notes (Signed)
Patient did not attend Orientation group because she was asleep.

## 2020-09-12 NOTE — BHH Suicide Risk Assessment (Signed)
[place] INPATIENT:  Family/Significant Other Suicide Prevention Education  Education Completed; Noel Gerold (sponsor) [phone] has been identified by the patient as the family member/significant other with whom the patient will be residing, and identified as the person(s) who will aid the patient in the event of a mental health crisis (suicidal ideations/suicide attempt). With written consent from the patient, the family member/significant other has been provided the following suicide prevention education, prior to the and/or following the discharge of the patient.  The suicide prevention education provided includes the following:   Suicide risk factors   Suicide prevention and interventions   National Suicide Hotline telephone number   [facility] assessment telephone number   [facility] Emergency Assistance [place] and/or Residential Mobile Crisis Unit telephone number  Request made of family/significant other to:   Remove weapons (e.g., guns, rifles, knives), all items previously/currently identified as safety concern.   Remove drugs/medications (over-the-counter, prescriptions, illicit drugs), all items previously/currently identified as a safety concern.  The family member/significant other verbalizes understanding of the suicide prevention education information provided. The family member/significant other agrees to remove the items of safety concern listed above.  Raquel Sarna reports that pt was trying to get adjustments with her medications because pt thought they had started not helping. Pt was having trouble getting switched to a new psychiatrist and felt this was the only way for her to get an appointment fast. Raquel Sarna reports no safety concerns or weapons in the home.   Toney Reil, [place] Worker [employer]

## 2020-09-12 NOTE — Progress Notes (Signed)
   09/12/20 2200  Psych Admission Type (Psych Patients Only)  Admission Status Voluntary  Psychosocial Assessment  Patient Complaints None  Eye Contact Fair  Facial Expression Anxious  Affect Depressed  Speech Logical/coherent  Interaction Assertive  Motor Activity Restless  Appearance/Hygiene Unremarkable  Behavior Characteristics Cooperative  Mood Anxious;Depressed  Thought Process  Coherency WDL  Content WDL  Delusions None reported or observed  Perception WDL  Hallucination None reported or observed  Judgment WDL  Confusion None  Danger to Self  Current suicidal ideation? Denies  Danger to Others  Danger to Others None reported or observed

## 2020-09-12 NOTE — Progress Notes (Signed)
Patient did not attend group because she was asleep.

## 2020-09-13 MED ORDER — RISPERIDONE 0.5 MG PO TABS: 0.5000 mg | ORAL_TABLET | Freq: Two times a day (BID) | ORAL | 0 refills | Status: DC

## 2020-09-13 MED ORDER — PRAZOSIN HCL 1 MG PO CAPS: 3.0000 mg | ORAL_CAPSULE | Freq: Every day | ORAL | 0 refills | Status: DC

## 2020-09-13 MED ORDER — LAMOTRIGINE 100 MG PO TABS: 100.0000 mg | ORAL_TABLET | Freq: Two times a day (BID) | ORAL | 0 refills | Status: DC

## 2020-09-13 MED ORDER — GABAPENTIN 100 MG PO CAPS: 100.0000 mg | ORAL_CAPSULE | Freq: Three times a day (TID) | ORAL | 0 refills | Status: DC

## 2020-09-13 MED ORDER — FLUOXETINE HCL 10 MG PO CAPS: 30.0000 mg | ORAL_CAPSULE | Freq: Every day | ORAL | 0 refills | Status: DC
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
## 2020-09-13 NOTE — BHH Suicide Risk Assessment (Signed)
[facility] Discharge Suicide Risk Assessment   Principal Problem: Bipolar depression [facility]) Discharge Diagnoses: Principal Problem:   Bipolar depression ([place]) Active Problems:   Hx of substance abuse--staying at [facility]   MDD (major depressive disorder), recurrent episode, severe ([place])   Chronic post-traumatic stress disorder (PTSD)   Total Time spent with patient: 20 minutes   See Discharge summary for mental status exam  Today, patient deneis any suicidal or homicidal ideation. She reports a good mood and is future orientated.    On Admission:  Suicidal ideation indicated by patient, Self-harm thoughts, Belief that plan would result in death  Demographic Factors:  Adolescent or young adult  Loss Factors: NA  Historical Factors: Impulsivity  Risk Reduction Factors:   Sense of responsibility to family, Religious beliefs about death, Positive social support, Positive therapeutic relationship and Positive coping skills or problem solving skills  Continued Clinical Symptoms:  Previous Psychiatric Diagnoses and Treatments  Cognitive Features That Contribute To Risk:  None    Suicide Risk:  Minimal: No identifiable suicidal ideation.  Patients presenting with no risk factors but with morbid ruminations; may be classified as minimal risk based on the severity of the depressive symptoms   Follow-up [facility] on 09/23/2020.   Specialty: Behavioral Health Why: You have a walk in appointment for therapy services on 09/23/20 at 1:00 pm. You also have a walk in appointment on 10/13/20 at 2:00 pm  for medication management.  These appointments will be held in person. Contact information: [place] [phone]              Plan Of Care/Follow-up recommendations:  Other:  Follow-up with outpatient care  Dixie Dials, MD 09/13/2020, 10:01 AM

## 2020-09-13 NOTE — Progress Notes (Signed)
  [facility] Adult Case Management Discharge Plan :  Will you be returning to the same living situation after discharge:  Yes,  Home with roommates At discharge, do you have transportation home?: Yes,  Own car Do you have the ability to pay for your medications: Yes,  Insurance   Release of information consent forms completed and in the chart;  Patient's signature needed at discharge.  Patient to Follow up at:  [facility] on 09/23/2020.   Specialty: Behavioral Health Why: You have a walk in appointment for therapy services on 09/23/20 at 1:00 pm. You also have a walk in appointment on 10/13/20 at 2:00 pm  for medication management.  These appointments will be held in person. Contact information: [place] [phone]              Next level of care provider has access to [place] and Suicide Prevention discussed: Yes,  with sponsor and patient   Have you used any form of tobacco in the last 30 days? (Cigarettes, Smokeless Tobacco, Cigars, and/or Pipes): Yes  Has patient been referred to the Quitline?: Patient refused referral  Patient has been referred for addiction treatment: N/A  Darleen Crocker, [place] 09/13/2020, 8:59 AM

## 2020-09-13 NOTE — Plan of Care (Signed)
Discharge note  Patient verbalizes readiness for discharge. Follow up plan explained, AVS, Transition record and SRA given. Prescriptions and teaching provided. Belongings returned and signed for. Suicide safety plan completed and signed. Patient verbalizes understanding. Patient denies SI/HI and assures this Probation officer they will seek assistance should that change. Patient discharged to lobby where pt's car was waiting.  Problem: Education: Goal: Ability to make informed decisions regarding treatment will improve Outcome: Adequate for Discharge   Problem: Coping: Goal: Coping ability will improve Outcome: Adequate for Discharge   Problem: Health Behavior/Discharge Planning: Goal: Identification of resources available to assist in meeting health care needs will improve Outcome: Adequate for Discharge   Problem: Medication: Goal: Compliance with prescribed medication regimen will improve Outcome: Adequate for Discharge   Problem: Self-Concept: Goal: Ability to disclose and discuss suicidal ideas will improve Outcome: Adequate for Discharge Goal: Will verbalize positive feelings about self Outcome: Adequate for Discharge   Problem: Education: Goal: Ability to state activities that reduce stress will improve Outcome: Adequate for Discharge   Problem: Coping: Goal: Ability to identify and develop effective coping behavior will improve Outcome: Adequate for Discharge   Problem: Self-Concept: Goal: Ability to identify factors that promote anxiety will improve Outcome: Adequate for Discharge Goal: Level of anxiety will decrease Outcome: Adequate for Discharge Goal: Ability to modify response to factors that promote anxiety will improve Outcome: Adequate for Discharge   Problem: Education: Goal: Knowledge of General Education information will improve Description: Including pain rating scale, medication(s)/side effects and non-pharmacologic comfort measures Outcome: Adequate for  Discharge   Problem: Health Behavior/Discharge Planning: Goal: Ability to manage health-related needs will improve Outcome: Adequate for Discharge   Problem: Clinical Measurements: Goal: Ability to maintain clinical measurements within normal limits will improve Outcome: Adequate for Discharge Goal: Will remain free from infection Outcome: Adequate for Discharge Goal: Diagnostic test results will improve Outcome: Adequate for Discharge Goal: Respiratory complications will improve Outcome: Adequate for Discharge Goal: Cardiovascular complication will be avoided Outcome: Adequate for Discharge   Problem: Activity: Goal: Risk for activity intolerance will decrease Outcome: Adequate for Discharge   Problem: Nutrition: Goal: Adequate nutrition will be maintained Outcome: Adequate for Discharge   Problem: Coping: Goal: Level of anxiety will decrease Outcome: Adequate for Discharge   Problem: Elimination: Goal: Will not experience complications related to bowel motility Outcome: Adequate for Discharge Goal: Will not experience complications related to urinary retention Outcome: Adequate for Discharge   Problem: Pain Managment: Goal: General experience of comfort will improve Outcome: Adequate for Discharge   Problem: Safety: Goal: Ability to remain free from injury will improve Outcome: Adequate for Discharge   Problem: Skin Integrity: Goal: Risk for impaired skin integrity will decrease Outcome: Adequate for Discharge   Problem: Education: Goal: Knowledge of [facility] Education information/materials will improve Outcome: Adequate for Discharge Goal: Emotional status will improve Outcome: Adequate for Discharge Goal: Mental status will improve Outcome: Adequate for Discharge Goal: Verbalization of understanding the information provided will improve Outcome: Adequate for Discharge   Problem: Activity: Goal: Interest or engagement in activities will  improve Outcome: Adequate for Discharge Goal: Sleeping patterns will improve Outcome: Adequate for Discharge   Problem: Coping: Goal: Ability to verbalize frustrations and anger appropriately will improve Outcome: Adequate for Discharge Goal: Ability to demonstrate self-control will improve Outcome: Adequate for Discharge   Problem: Health Behavior/Discharge Planning: Goal: Identification of resources available to assist in meeting health care needs will improve Outcome: Adequate for Discharge Goal: Compliance with treatment plan  for underlying cause of condition will improve Outcome: Adequate for Discharge   Problem: Physical Regulation: Goal: Ability to maintain clinical measurements within normal limits will improve Outcome: Adequate for Discharge   Problem: Safety: Goal: Periods of time without injury will increase Outcome: Adequate for Discharge
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
## 2020-09-23 ENCOUNTER — Ambulatory Visit (INDEPENDENT_AMBULATORY_CARE_PROVIDER_SITE_OTHER): Payer: 59 | Admitting: Clinical

## 2020-09-23 ENCOUNTER — Other Ambulatory Visit: Payer: Self-pay

## 2020-09-23 DIAGNOSIS — F332 Major depressive disorder, recurrent severe without psychotic features: Secondary | ICD-10-CM

## 2020-09-23 DIAGNOSIS — F4310 Post-traumatic stress disorder, unspecified: Secondary | ICD-10-CM

## 2020-09-23 DIAGNOSIS — F1021 Alcohol dependence, in remission: Secondary | ICD-10-CM

## 2020-09-26 ENCOUNTER — Ambulatory Visit (HOSPITAL_COMMUNITY): Payer: 59 | Admitting: Licensed Clinical Social Worker
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
## 2020-10-05 ENCOUNTER — Encounter (HOSPITAL_COMMUNITY): Payer: Self-pay | Admitting: Physician Assistant

## 2020-10-05 ENCOUNTER — Ambulatory Visit (INDEPENDENT_AMBULATORY_CARE_PROVIDER_SITE_OTHER): Payer: No Payment, Other | Admitting: Physician Assistant

## 2020-10-05 VITALS — BP 106/55 | HR 78 | Temp 98.3°F | Ht 61.0 in | Wt 100.0 lb

## 2020-10-05 DIAGNOSIS — F419 Anxiety disorder, unspecified: Secondary | ICD-10-CM | POA: Diagnosis not present

## 2020-10-05 DIAGNOSIS — F4312 Post-traumatic stress disorder, chronic: Secondary | ICD-10-CM

## 2020-10-05 DIAGNOSIS — F32A Depression, unspecified: Secondary | ICD-10-CM

## 2020-10-05 DIAGNOSIS — F319 Bipolar disorder, unspecified: Secondary | ICD-10-CM | POA: Diagnosis not present

## 2020-10-05 DIAGNOSIS — R441 Visual hallucinations: Secondary | ICD-10-CM

## 2020-10-05 MED ORDER — GABAPENTIN 100 MG PO CAPS: 100.0000 mg | ORAL_CAPSULE | Freq: Three times a day (TID) | ORAL | 1 refills | Status: DC

## 2020-10-05 MED ORDER — FLUOXETINE HCL 10 MG PO CAPS: 30.0000 mg | ORAL_CAPSULE | Freq: Every day | ORAL | 1 refills | Status: DC

## 2020-10-05 MED ORDER — LAMOTRIGINE 100 MG PO TABS: 100.0000 mg | ORAL_TABLET | Freq: Two times a day (BID) | ORAL | 1 refills | Status: DC

## 2020-10-05 MED ORDER — OLANZAPINE 5 MG PO TABS: 5.0000 mg | ORAL_TABLET | Freq: Every day | ORAL | 1 refills | Status: DC
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
## 2020-10-13 ENCOUNTER — Other Ambulatory Visit: Payer: Self-pay

## 2020-10-13 ENCOUNTER — Ambulatory Visit (INDEPENDENT_AMBULATORY_CARE_PROVIDER_SITE_OTHER): Payer: No Payment, Other | Admitting: Physician Assistant

## 2020-10-13 DIAGNOSIS — F319 Bipolar disorder, unspecified: Secondary | ICD-10-CM | POA: Diagnosis not present

## 2020-10-13 DIAGNOSIS — R441 Visual hallucinations: Secondary | ICD-10-CM | POA: Diagnosis not present

## 2020-10-13 DIAGNOSIS — F419 Anxiety disorder, unspecified: Secondary | ICD-10-CM

## 2020-10-13 DIAGNOSIS — F4312 Post-traumatic stress disorder, chronic: Secondary | ICD-10-CM

## 2020-10-13 DIAGNOSIS — F32A Depression, unspecified: Secondary | ICD-10-CM

## 2020-10-13 MED ORDER — FLUOXETINE HCL 40 MG PO CAPS: 40.0000 mg | ORAL_CAPSULE | Freq: Every day | ORAL | 1 refills | Status: DC

## 2020-10-14 ENCOUNTER — Telehealth: Payer: Self-pay | Admitting: Internal Medicine

## 2020-10-14 NOTE — Telephone Encounter (Signed)
She can try to see if another provider has any openings if she believes she needs to be seen sooner. If cannot wait, advise UC.

## 2020-10-14 NOTE — Telephone Encounter (Signed)
Pt is calling in needing to be seen sooner than next week and she is aware that her provider does not have anything for a hospital follow-up and was stating that she has been having fainting spells and wanted [place] to see if her provider could work her in.  Pt would like to have a call back.

## 2020-10-14 NOTE — Telephone Encounter (Signed)
Patient is aware 

## 2020-11-22 ENCOUNTER — Telehealth (INDEPENDENT_AMBULATORY_CARE_PROVIDER_SITE_OTHER): Payer: 59 | Admitting: Physician Assistant

## 2020-11-22 ENCOUNTER — Other Ambulatory Visit: Payer: Self-pay

## 2020-11-22 ENCOUNTER — Encounter (HOSPITAL_COMMUNITY): Payer: Self-pay | Admitting: Physician Assistant

## 2020-11-22 DIAGNOSIS — F319 Bipolar disorder, unspecified: Secondary | ICD-10-CM

## 2020-11-22 DIAGNOSIS — F419 Anxiety disorder, unspecified: Secondary | ICD-10-CM | POA: Diagnosis not present

## 2020-11-22 DIAGNOSIS — R441 Visual hallucinations: Secondary | ICD-10-CM | POA: Diagnosis not present

## 2020-11-22 DIAGNOSIS — F4312 Post-traumatic stress disorder, chronic: Secondary | ICD-10-CM | POA: Diagnosis not present

## 2020-11-22 MED ORDER — OLANZAPINE 5 MG PO TABS: 5.0000 mg | ORAL_TABLET | Freq: Every day | ORAL | 1 refills | Status: DC

## 2020-11-22 MED ORDER — FLUOXETINE HCL 40 MG PO CAPS: 40.0000 mg | ORAL_CAPSULE | Freq: Every day | ORAL | 1 refills | Status: DC

## 2020-11-22 NOTE — Progress Notes (Signed)
[facility] MD/PA/NP OP Progress Note  Virtual Visit via Telephone Note  I connected with Karen [name] on 11/22/20 at  8:30 AM EST by telephone and verified that I am speaking with the correct person using two identifiers.  Location: Patient: Home Provider: Clinic   I discussed the limitations, risks, security and privacy concerns of performing an evaluation and management service by telephone and the availability of in person appointments. I also discussed with the patient that there may be a patient responsible charge related to this service. The patient expressed understanding and agreed to proceed.  Follow Up Instructions:  I discussed the assessment and treatment plan with the patient. The patient was provided an opportunity to ask questions and all were answered. The patient agreed with the plan and demonstrated an understanding of the instructions.   The patient was advised to call back or seek an in-person evaluation if the symptoms worsen or if the condition fails to improve as anticipated.  I provided 21 minutes of non-face-to-face time during this encounter.   [name], PA   11/22/2020 6:20 PM Karen [name]  MRN:  [id number]  Chief Complaint: Follow-up and medication management  HPI:  Karen [name] is a 30 year old female with a past psychiatric history significant for bipolar 1 disorder, chronic anxiety, PTSD, and visual hallucinations who presents to [facility] for follow-up.  Patient is currently being managed on the following medications:  Gabapentin 100 mg 3 times daily for the management of anxiety Fluoxetine 30 mg daily (3 x 10 mg tablets) Lamotrigine 100 mg 2 times daily Olanzapine 5 mg at bedtime  Patient reports that her experience with her medications has been going well.  Patient reports that she has been doubling up on her dose of gabapentin (200 mg 3 times daily) in order to manage her anxiety.  She  reports that by taking 200 mg of gabapentin, it has helped decrease her anxiety level thereby eliminating the recurrence of panic attacks.  Patient states that she has no other issues or concerns at this time.  Patient is wanting a refill on her olanzapine and Prozac.  Patient reports that she is in a really good mood and has been handling stress well.  She denies suicidal or homicidal ideations.  She further denies auditory hallucinations, however, she states that she did experience a hallucination that involved seeing bugs on her.  Patient reports that she may experience this type of hallucination whenever her stress becomes too high.  Patient reports that her stress may be related to recently buying a house for herself.  Patient reports that her last hallucination occurred roughly 10 days ago.  Patient endorses good sleep and receives on average 8 hours of sleep a night.  Patient reports that her appetite has improved and that she is currently eating 3 meals a day.  Patient denies alcohol consumption, tobacco use, and illicit drug use.  Visit Diagnosis:    ICD-10-CM   1. Bipolar 1 disorder (HCC)  F31.9 FLUoxetine (PROZAC) 40 MG capsule    OLANZapine (ZYPREXA) 5 MG tablet  2. Chronic anxiety  F41.9 FLUoxetine (PROZAC) 40 MG capsule  3. Chronic post-traumatic stress disorder (PTSD)  F43.12 FLUoxetine (PROZAC) 40 MG capsule  4. Visual hallucinations  R44.1 OLANZapine (ZYPREXA) 5 MG tablet    Past Psychiatric History:  Bipolar I Disorder Anxiety Chronic PTSD Visual Hallucinations  Past Medical History:  Past Medical History:  Diagnosis Date  . Anxiety   .  Bipolar disorder ([place])   . Chronic UTI   . Depression   . Endometriosis   . HSV (herpes simplex virus) anogenital infection   . PTSD (post-traumatic stress disorder)     Past Surgical History:  Procedure Laterality Date  . CESAREAN SECTION N/A 02/17/2018   Procedure: CESAREAN SECTION;  Surgeon: Sanjuana Kava, MD;  Location: [place];  Service: Obstetrics;  Laterality: N/A;  . DIAGNOSTIC LAPAROSCOPY  2014  . DILATION AND EVACUATION N/A 03/11/2015   Procedure: DILATATION AND EVACUATION;  Surgeon: Waymon Amato, MD;  Location: [facility];  Service: Gynecology;  Laterality: N/A;  . TONSILLECTOMY  2015    Family Psychiatric History: Mother - Major Depressive Disorder Sister - Obsessive Compulsive Disorder Aunt (maternal) - Bipolar I Disorder Uncle (maternal) - Bipolar I Disorder  Family History:  Family History  Family history unknown: Yes    Social History:  Social History   Socioeconomic History  . Marital status: Married    Spouse name: Aissa Fernicola  . Number of children: 1  . Years of education: Not on file  . Highest education level: Some college, no degree  Occupational History  . Not on file  Tobacco Use  . Smoking status: Former Smoker    Packs/day: 1.00    Types: Cigarettes    Quit date: 03/05/2017    Years since quitting: 3.7  . Smokeless tobacco: Current User  Substance and Sexual Activity  . Alcohol use: No  . Drug use: Yes    Types: Heroin, Cocaine, Marijuana    Comment: Clean since Aug 2015  . Sexual activity: Not Currently  Other Topics Concern  . Not on file  Social History Narrative  . Not on file   Social Determinants of Health   Financial Resource Strain: Not on file  Food Insecurity: Not on file  Transportation Needs: Not on file  Physical Activity: Not on file  Stress: Not on file  Social Connections: Not on file    Allergies:  Allergies  Allergen Reactions  . Adhesive [Tape] Hives  . Ciprofloxacin Hives    Metabolic Disorder Labs: Lab Results  Component Value Date   HGBA1C 4.6 (L) 09/11/2020   MPG 85.32 09/11/2020   No results found for: PROLACTIN Lab Results  Component Value Date   CHOL 137 09/11/2020   TRIG 40 09/11/2020   HDL 42 09/11/2020   CHOLHDL 3.3 09/11/2020   VLDL 8 09/11/2020   LDLCALC 87 09/11/2020   LDLCALC 90 11/13/2019   Lab Results   Component Value Date   TSH 0.784 09/11/2020   TSH 0.68 06/21/2020    Therapeutic Level Labs: No results found for: LITHIUM No results found for: VALPROATE No components found for:  CBMZ  Current Medications: Current Outpatient Medications  Medication Sig Dispense Refill  . FLUoxetine (PROZAC) 40 MG capsule Take 1 capsule (40 mg total) by mouth daily. 30 capsule 1  . folic acid (FOLVITE) 1 MG tablet Take 1 mg by mouth daily.    Marland Kitchen gabapentin (NEURONTIN) 100 MG capsule Take 1 capsule (100 mg total) by mouth 3 (three) times daily. 90 capsule 1  . lamoTRIgine (LAMICTAL) 100 MG tablet Take 1 tablet (100 mg total) by mouth 2 (two) times daily. 60 tablet 1  . OLANZapine (ZYPREXA) 5 MG tablet Take 1 tablet (5 mg total) by mouth at bedtime. 30 tablet 1  . prazosin (MINIPRESS) 1 MG capsule Take 3 capsules (3 mg total) by mouth at bedtime. 90 capsule 0  .  Specialty Vitamins Products (MAGNESIUM, AMINO ACID CHELATE,) 133 MG tablet Take 1 tablet by mouth 2 (two) times daily.    . valACYclovir (VALTREX) 1000 MG tablet Take 1,000 mg by mouth daily.     No current facility-administered medications for this visit.     Musculoskeletal: Strength & Muscle Tone: Unable to assess due to telemedicine visit [place]: Unable to assess due to telemedicine visit Patient leans:  Unable to assess due to telemedicine visit  Psychiatric Specialty Exam: Review of Systems  Psychiatric/Behavioral: Negative for dysphoric mood, hallucinations (Patient states that she did have visual/tactile hallucinations that manifest as bugs being on her), self-injury, sleep disturbance and suicidal ideas. The patient is nervous/anxious. The patient is not hyperactive.     There were no vitals taken for this visit.There is no height or weight on file to calculate BMI.  General Appearance: Unable to assess due to telemedicine visit  Eye Contact:  Unable to assess due to telemedicine visit  Speech:  Clear and Coherent and  Normal Rate  Volume:  Normal  Mood:  Euthymic  Affect:  Appropriate  Thought Process:  Coherent and Descriptions of Associations: Intact  Orientation:  Full (Time, Place, and Person)  Thought Content: WDL and Logical   Suicidal Thoughts:  No  Homicidal Thoughts:  No  Memory:  Immediate;   Good Recent;   Good Remote;   Good  Judgement:  Good  Insight:  Good  Psychomotor Activity:  Normal  Concentration:  Concentration: Good and Attention Span: Good  Recall:  Good  Fund of Knowledge: Good  Language: Good  Akathisia:  NA  Handed:  Ambidextrous  AIMS (if indicated): not done  Assets:  Communication Skills Desire for Improvement Housing Social Support Vocational/Educational  ADL's:  Intact  Cognition: WNL  Sleep:  Good   Screenings: AIMS   Flowsheet Row Admission (Discharged) from OP Visit from 09/10/2020 in [place] [facility] Admission (Discharged) from OP Visit from 11/12/2019 in [place] [facility]  AIMS Total Score 0 0    AUDIT   Flowsheet Row Admission (Discharged) from OP Visit from 09/10/2020 in [place] [facility] Admission (Discharged) from OP Visit from 11/12/2019 in [place] [facility]  Alcohol Use Disorder Identification Test Final Score (AUDIT) 0 0    GAD-7   Flowsheet Row Counselor from 09/23/2020 in [facility]  Total GAD-7 Score 21    PHQ2-9   Flowsheet Row Counselor from 09/23/2020 in [facility] Office Visit from 11/12/2019 in [place] [facility]  PHQ-2 Total Score 4 3  PHQ-9 Total Score 16 12       Assessment and Plan:   Karen [name] is a 30 year old female with a past psychiatric history significant for bipolar 1 disorder, chronic anxiety, PTSD, and visual hallucinations who presents to [facility] for follow-up.  Patient is currently  being managed on the following medications:  Gabapentin 100 mg 3 times daily for the management of anxiety Fluoxetine 30 mg daily (3 x 10 mg tablets) Lamotrigine 100 mg 2 times daily Olanzapine 5 mg at bedtime  Patient reports that her current regimen of medications have been treating her well.  She reports that she has been having to double up on her gabapentin in order to manage her anxiety.  Patient reports that she would like to start taking gabapentin 200 mg 3 times daily.  Patient  states that her last hallucination occurred roughly 10 days ago and involved seeing bugs all over her.  Patient attributes recent hallucination to stress and states that she did recently buy a house.  Patient has no other issues or concerns at this time and would like refills on her olanzapine and Prozac.  Patient's medications will be e-prescribed to pharmacy of choice.  1. Bipolar 1 disorder ([place]) Patient to continue taking Lamictal as scheduled  - FLUoxetine (PROZAC) 40 MG capsule; Take 1 capsule (40 mg total) by mouth daily.  Dispense: 30 capsule; Refill: 1 - OLANZapine (ZYPREXA) 5 MG tablet; Take 1 tablet (5 mg total) by mouth at bedtime.  Dispense: 30 tablet; Refill: 1  2. Chronic anxiety Patient to continue taking gabapentin 100 mg 3 times daily as for the management of anxiety  - FLUoxetine (PROZAC) 40 MG capsule; Take 1 capsule (40 mg total) by mouth daily.  Dispense: 30 capsule; Refill: 1  3. Chronic post-traumatic stress disorder (PTSD)  - FLUoxetine (PROZAC) 40 MG capsule; Take 1 capsule (40 mg total) by mouth daily.  Dispense: 30 capsule; Refill: 1  4. Visual hallucinations  - OLANZapine (ZYPREXA) 5 MG tablet; Take 1 tablet (5 mg total) by mouth at bedtime.  Dispense: 30 tablet; Refill: 1  Patient to follow-up in 5 weeks  [name], PA 11/22/2020, 6:20 PM

## 2020-11-22 NOTE — Progress Notes (Signed)
[facility] MD/PA/NP OP Progress Note  10/13/2020 2:00 PM Karen [name]  MRN:  [id number]  Chief Complaint:  Chief Complaint    Follow-up     HPI:   Karen [name] is a 30 year old female with a past psychiatric history significant for bipolar 1 disorder, chronic anxiety, PTSD, and visual hallucinations who presents to [facility] for follow-up.  Patient is currently being managed on the following medications:  Gabapentin 100 mg 3 times daily for the management of anxiety Fluoxetine 30 mg daily (3 x 10 mg tablets) Lamotrigine 100 mg 2 times daily Olanzapine 5 mg at bedtime  Patient reports that she is doing much better since the previous encounter.  Patient states that she has been sleeping more and feels more rested.  On an added note, patient states that she has been experiencing less hallucinations since being placed on olanzapine.  Patient states that she has been having trouble managing her racing thoughts.  Patient attributes her racing thoughts to anxiety over random activities that may or may have not been completed.  Patient states that when she is dealing with her racing thoughts, it causes her to lose focus which oftentimes can lead to her making errors.  Patient reports she has felt overly elevated and very manic lately.  Patient states that she is in peak manic mode but it has been easier to manage now that she is on medications.  Patient states that she is worried about her depression once her manic phase ends.  Patient denies suicidal and homicidal ideations.  She further denies current auditory and visual hallucinations.  She does endorse experiencing 2 instances of hallucinations in the past. Patient endorses good sleep and has been receiving roughly 8 hours of sleep a night.  She endorses good appetite.  She denies alcohol consumption and illicit drug use.  Patient reports that she is still vaping.  Visit Diagnosis:    ICD-10-CM   1.  Bipolar 1 disorder (HCC)  F31.9 FLUoxetine (PROZAC) 40 MG capsule  2. Chronic anxiety  F41.9 FLUoxetine (PROZAC) 40 MG capsule  3. Chronic post-traumatic stress disorder (PTSD)  F43.12 FLUoxetine (PROZAC) 40 MG capsule  4. Visual hallucinations  R44.1     Past Psychiatric History:  Bipolar I Disorder Anxiety Chronic PTSD Visual Hallucinations  Past Medical History:  Past Medical History:  Diagnosis Date  . Anxiety   . Bipolar disorder ([place])   . Chronic UTI   . Depression   . Endometriosis   . HSV (herpes simplex virus) anogenital infection   . PTSD (post-traumatic stress disorder)     Past Surgical History:  Procedure Laterality Date  . CESAREAN SECTION N/A 02/17/2018   Procedure: CESAREAN SECTION;  Surgeon: Sanjuana Kava, MD;  Location: [place];  Service: Obstetrics;  Laterality: N/A;  . DIAGNOSTIC LAPAROSCOPY  2014  . DILATION AND EVACUATION N/A 03/11/2015   Procedure: DILATATION AND EVACUATION;  Surgeon: Waymon Amato, MD;  Location: [facility];  Service: Gynecology;  Laterality: N/A;  . TONSILLECTOMY  2015    Family Psychiatric History:  Mother - Major Depressive Disorder Sister - Obsessive Compulsive Disorder Aunt (maternal) - Bipolar I Disorder Uncle (maternal) - Bipolar I Disorder  Family History:  Family History  Family history unknown: Yes    Social History:  Social History   Socioeconomic History  . Marital status: Married    Spouse name: Alta Goding  . Number of children: 1  . Years of education: Not on file  .  Highest education level: Some college, no degree  Occupational History  . Not on file  Tobacco Use  . Smoking status: Former Smoker    Packs/day: 1.00    Types: Cigarettes    Quit date: 03/05/2017    Years since quitting: 3.7  . Smokeless tobacco: Current User  Substance and Sexual Activity  . Alcohol use: No  . Drug use: Yes    Types: Heroin, Cocaine, Marijuana    Comment: Clean since Aug 2015  . Sexual activity: Not Currently  Other  Topics Concern  . Not on file  Social History Narrative  . Not on file   Social Determinants of Health   Financial Resource Strain: Not on file  Food Insecurity: Not on file  Transportation Needs: Not on file  Physical Activity: Not on file  Stress: Not on file  Social Connections: Not on file    Allergies:  Allergies  Allergen Reactions  . Adhesive [Tape] Hives  . Ciprofloxacin Hives    Metabolic Disorder Labs: Lab Results  Component Value Date   HGBA1C 4.6 (L) 09/11/2020   MPG 85.32 09/11/2020   No results found for: PROLACTIN Lab Results  Component Value Date   CHOL 137 09/11/2020   TRIG 40 09/11/2020   HDL 42 09/11/2020   CHOLHDL 3.3 09/11/2020   VLDL 8 09/11/2020   LDLCALC 87 09/11/2020   LDLCALC 90 11/13/2019   Lab Results  Component Value Date   TSH 0.784 09/11/2020   TSH 0.68 06/21/2020    Therapeutic Level Labs: No results found for: LITHIUM No results found for: VALPROATE No components found for:  CBMZ  Current Medications: Current Outpatient Medications  Medication Sig Dispense Refill  . FLUoxetine (PROZAC) 40 MG capsule Take 1 capsule (40 mg total) by mouth daily. 30 capsule 1  . folic acid (FOLVITE) 1 MG tablet Take 1 mg by mouth daily.    Marland Kitchen gabapentin (NEURONTIN) 100 MG capsule Take 1 capsule (100 mg total) by mouth 3 (three) times daily. 90 capsule 1  . lamoTRIgine (LAMICTAL) 100 MG tablet Take 1 tablet (100 mg total) by mouth 2 (two) times daily. 60 tablet 1  . OLANZapine (ZYPREXA) 5 MG tablet Take 1 tablet (5 mg total) by mouth at bedtime. 30 tablet 1  . prazosin (MINIPRESS) 1 MG capsule Take 3 capsules (3 mg total) by mouth at bedtime. 90 capsule 0  . Specialty Vitamins Products (MAGNESIUM, AMINO ACID CHELATE,) 133 MG tablet Take 1 tablet by mouth 2 (two) times daily.    . valACYclovir (VALTREX) 1000 MG tablet Take 1,000 mg by mouth daily.     No current facility-administered medications for this visit.     Musculoskeletal: Strength  & Muscle Tone: within normal limits Gait & Station: normal Patient leans: N/A  Psychiatric Specialty Exam: Review of Systems  Psychiatric/Behavioral: Positive for decreased concentration. Negative for dysphoric mood, hallucinations, sleep disturbance and suicidal ideas. The patient is nervous/anxious and is hyperactive.     There were no vitals taken for this visit.There is no height or weight on file to calculate BMI.  General Appearance: Fairly Groomed and Neat  Eye Contact:  Good  Speech:  Clear and Coherent and Normal Rate  Volume:  Normal  Mood:  Anxious and Euthymic  Affect:  Appropriate and Congruent  Thought Process:  Coherent, Goal Directed and Descriptions of Associations: Intact  Orientation:  Full (Time, Place, and Person)  Thought Content: WDL and Logical   Suicidal Thoughts:  No  Homicidal  Thoughts:  No  Memory:  Immediate;   Good Recent;   Good Remote;   Good  Judgement:  Good  Insight:  Fair  Psychomotor Activity:  Normal  Concentration:  Concentration: Good and Attention Span: Good  Recall:  Good  Fund of Knowledge: Good  Language: Good  Akathisia:  NA  Handed:  Ambidextrous  AIMS (if indicated): not done  Assets:  Communication Skills Desire for Improvement Housing Social Support Vocational/Educational  ADL's:  Intact  Cognition: WNL  Sleep:  Good   Screenings: AIMS   Flowsheet Row Admission (Discharged) from OP Visit from 09/10/2020 in [place] [facility] Admission (Discharged) from OP Visit from 11/12/2019 in [place] [facility]  AIMS Total Score 0 0    AUDIT   Flowsheet Row Admission (Discharged) from OP Visit from 09/10/2020 in [place] [facility] Admission (Discharged) from OP Visit from 11/12/2019 in [place] [facility]  Alcohol Use Disorder Identification Test Final Score (AUDIT) 0 0    GAD-7   Flowsheet Row Counselor from 09/23/2020 in  [facility]  Total GAD-7 Score 21    PHQ2-9   Flowsheet Row Counselor from 09/23/2020 in [facility] Office Visit from 11/12/2019 in [place] at [facility]  PHQ-2 Total Score 4 3  PHQ-9 Total Score 16 12       Assessment and Plan:  Karen [name] is a 30 year old female with a past psychiatric history significant for bipolar 1 disorder, chronic anxiety, PTSD, and visual hallucinations who presents to [facility] for follow-up.  It is currently being managed on the following medications:  Gabapentin 100 mg 3 times daily for the management of anxiety Fluoxetine 30 mg daily (3 x 10 mg tablets) Lamotrigine 100 mg 2 times daily  Olanzapine 5 mg at bedtime  Patient reports that she is doing much better than the previous encounter, however, she is still experiencing racing thoughts that she attributes to anxiety over activities that may or may not be completed yet.  Patient states that she currently feels manic but feels like the experience has been more tolerable since being placed on her medications.  Patient shows concerns over future episodes of depression she may experience once she is out of her manic phase.  Patient was recommended to increase her Prozac dosage from 30 mg to 40 mg to help manage her depression and racing thoughts attributed to anxiety.  Patient was agreeable to recommendation.  Patient's medication will be e-prescribed to pharmacy of choice.  1. Bipolar 1 disorder ([place]) Patient to continue taking olanzapine 5 mg at bedtime as scheduled Patient to continue taking Lamictal 100 mg 2 times daily as scheduled  - FLUoxetine (PROZAC) 40 MG capsule; Take 1 capsule (40 mg total) by mouth daily.  Dispense: 30 capsule; Refill: 1  2. Chronic anxiety Patient to continue taking gabapentin 100 mg 3 times daily as for the management of anxiety  - FLUoxetine (PROZAC) 40 MG  capsule; Take 1 capsule (40 mg total) by mouth daily.  Dispense: 30 capsule; Refill: 1  3. Chronic post-traumatic stress disorder (PTSD)  - FLUoxetine (PROZAC) 40 MG capsule; Take 1 capsule (40 mg total) by mouth daily.  Dispense: 30 capsule; Refill: 1  4. Visual hallucinations Patient to continue taking olanzapine 5 mg at bedtime as scheduled  Patient to follow-up in 5 weeks  [name], PA 11/22/2020, 4:51 PM

## 2020-12-20 ENCOUNTER — Ambulatory Visit (HOSPITAL_COMMUNITY): Payer: Self-pay

## 2020-12-27 ENCOUNTER — Telehealth (HOSPITAL_COMMUNITY): Payer: Self-pay

## 2020-12-27 ENCOUNTER — Other Ambulatory Visit (HOSPITAL_COMMUNITY): Payer: Self-pay | Admitting: Physician Assistant

## 2020-12-27 DIAGNOSIS — F319 Bipolar disorder, unspecified: Secondary | ICD-10-CM

## 2020-12-27 DIAGNOSIS — R441 Visual hallucinations: Secondary | ICD-10-CM

## 2020-12-27 MED ORDER — OLANZAPINE 7.5 MG PO TABS: 7.5000 mg | ORAL_TABLET | Freq: Every day | ORAL | 1 refills | Status: DC

## 2020-12-27 NOTE — Telephone Encounter (Signed)
Medication management - Message left for pt that her telephone message was received as pt with complaint her mood has been all over the place and changing often lately.  Reminded pt on message of appointment scheduled for 01/03/21 at 8:30 am. Requested patient call back to provide more information if could not wait until appointment on 01/03/21.  Questioned if patient taking medication as prescribed daily?

## 2020-12-27 NOTE — Telephone Encounter (Signed)
Provider was contacted by Pamella Pert. Lovena Le, RN in regards to recent changes in patient's mood.  Patient was contacted. Patient reports that she has been experiencing fluctuating mood for the past few days. She reports that she felt the need to engage in risky behavior and extravagant spending and shortly after, felt very depressed when going to work. Patient reports that the change from highs to lows has been making her feel like "shit."  Patient was recommended increasing her dosage of olanzapine from 5 mg to 7.5 mg at bedtime for the management of her fluctuating mood and manic/depressive features. Patient was agreeable to plan. Patient's medication will be e-prescribed to pharmacy of choice.

## 2020-12-27 NOTE — Telephone Encounter (Signed)
Medication management - Spoke to patient after she left a second message stating that she has been taking all her medications as prescribed and reviewed them, however, reports over the past 4 days she has noticed more racing thoughts and mood up and down.  Patient stated she either sleeps too much or not at all and the only way she knew to explain things as she stated she just feels "detached from the world" and not good.  Patient stated she is continuing to work and was at work when this nurse called her back.  Patient is aware of appointment set for 01/03/21 but feels she may need some type of medication adjustment prior to then.  Agreed to send message to Trinna Post, PA-C and patient to be aware he may call back this date so if she is at work she will not miss it.

## 2020-12-27 NOTE — Progress Notes (Signed)
Provider was contacted by Pamella Pert. Lovena Le, RN in regards to recent changes in patient's mood.   Patient was contacted later today. Patient reports that she has been experiencing fluctuating mood for the past few days. She reports that she felt the need to engage in risky behavior and extravagant spending and shortly after, felt very depressed when going to work. Patient reports that the change from highs to lows has been making her feel like "shit."  Patient was recommended increasing her dosage of olanzapine from 5 mg to 7.5 mg at bedtime for the management of her fluctuating mood and manic/depressive features. Patient was agreeable to plan. Patient's medication will be e-prescribed to pharmacy of choice.

## 2021-01-03 ENCOUNTER — Other Ambulatory Visit: Payer: Self-pay

## 2021-01-03 ENCOUNTER — Encounter (HOSPITAL_COMMUNITY): Payer: Self-pay | Admitting: Physician Assistant

## 2021-01-03 ENCOUNTER — Telehealth (INDEPENDENT_AMBULATORY_CARE_PROVIDER_SITE_OTHER): Payer: 59 | Admitting: Physician Assistant

## 2021-01-03 DIAGNOSIS — F419 Anxiety disorder, unspecified: Secondary | ICD-10-CM | POA: Diagnosis not present

## 2021-01-03 DIAGNOSIS — R441 Visual hallucinations: Secondary | ICD-10-CM

## 2021-01-03 DIAGNOSIS — F319 Bipolar disorder, unspecified: Secondary | ICD-10-CM

## 2021-01-03 DIAGNOSIS — F4312 Post-traumatic stress disorder, chronic: Secondary | ICD-10-CM

## 2021-01-03 MED ORDER — ESCITALOPRAM OXALATE 10 MG PO TABS: 10.0000 mg | ORAL_TABLET | Freq: Every day | ORAL | 1 refills | Status: DC

## 2021-01-03 MED ORDER — LAMOTRIGINE 100 MG PO TABS
100.0000 mg | ORAL_TABLET | Freq: Two times a day (BID) | ORAL | 1 refills | Status: DC
Start: 2021-01-03 — End: 2021-03-18

## 2021-01-03 MED ORDER — GABAPENTIN 300 MG PO CAPS
300.0000 mg | ORAL_CAPSULE | Freq: Three times a day (TID) | ORAL | 2 refills | Status: DC
Start: 2021-01-03 — End: 2021-04-19

## 2021-01-03 MED ORDER — FLUOXETINE HCL 20 MG PO CAPS: 20.0000 mg | ORAL_CAPSULE | Freq: Every day | ORAL | 0 refills | Status: DC

## 2021-01-03 NOTE — Progress Notes (Signed)
[place] MD/PA/NP OP Progress Note  Virtual Visit via Telephone Note  I connected with Karen [name] on 01/03/21 at  8:30 AM EST by telephone and verified that I am speaking with the correct person using two identifiers.  Location: Patient: Privacy of her car Provider: Clinic   I discussed the limitations, risks, security and privacy concerns of performing an evaluation and management service by telephone and the availability of in person appointments. I also discussed with the patient that there may be a patient responsible charge related to this service. The patient expressed understanding and agreed to proceed.  Follow Up Instructions:  I discussed the assessment and treatment plan with the patient. The patient was provided an opportunity to ask questions and all were answered. The patient agreed with the plan and demonstrated an understanding of the instructions.   The patient was advised to call back or seek an in-person evaluation if the symptoms worsen or if the condition fails to improve as anticipated.  I provided 26 minutes of non-face-to-face time during this encounter.   [name], PA   01/03/2021 9:44 PM Karen [name]  MRN:  [id number]  Chief Complaint: Follow-up and medication management  HPI:   Karen [name] is a 30 year old with a past psychiatric history significant for bipolar 1 disorder, chronic anxiety, PTSD, and visual hallucinations presents to [facility] via virtual telephone visit for follow-up and medication management.  Patient is currently being managed on the following medications:  Gabapentin 200 mg 3 times daily Fluoxetine 40 mg daily Lamotrigine 100 mg 2 times daily Olanzapine 7.5 mg at bedtime  Patient recently contacted [facility] with a complaint of fluctuating mood over the past couple of days.  Patient was recommended increasing her dosage of  olanzapine from 5 mg to 7.5 mg at bedtime.  Since the olanzapine dosage increase, patient reports that the past few days have been rough.  Patient states that she feels like she is two bad days away from ending up in a psychiatric ward.  Patient reports that she has been experiencing paranoia and feels that people close to her or secretly against her.  She reports that she will gain clarity a few days later and realize that her friends were never against her.  Patient reports that this behavior has been ruining her friendships and straining her relationship with her boyfriend.  Patient also reports instances of dissociation where she feels like her mind is leaving her body and she spaces out for long periods of time.  Patient reports that her friends and her boyfriend has started to notice whenever she is spacing out.  Patient states that these instances of dissociation are often triggered by her anxiety.  Patient reports that she has been taking gabapentin 300 mg 3 times a day as a means to help manage her anxiety.   Patient is pleasant, calm, cooperative, and fully engaged in conversation during the encounter. Patient reports that her mood is still all over the place which the patient finds mentally exhausting. Patient reports that she is currently very anxious and tired.  Patient denies suicidal and homicidal ideations.  She further denies auditory or visual hallucinations.  Patient endorses fair sleep and receives on average 8 hours of sleep each night.  Patient denies that the sleep is restful and states that she just feels like her medications just "drug" her to sleep.  Patient endorses good appetite and eats on average  2 meals per day.  Patient denies alcohol consumption, tobacco use, and illicit drug use.  Visit Diagnosis:    ICD-10-CM   1. Bipolar 1 disorder (HCC)  F31.9 FLUoxetine (PROZAC) 20 MG capsule    lamoTRIgine (LAMICTAL) 100 MG tablet    escitalopram (LEXAPRO) 10 MG tablet  2. Chronic  anxiety  F41.9 FLUoxetine (PROZAC) 20 MG capsule    gabapentin (NEURONTIN) 300 MG capsule    escitalopram (LEXAPRO) 10 MG tablet  3. Chronic post-traumatic stress disorder (PTSD)  F43.12 FLUoxetine (PROZAC) 20 MG capsule    escitalopram (LEXAPRO) 10 MG tablet  4. Visual hallucinations  R44.1     Past Psychiatric History: Bipolar I Disorder Generalized anxiety disorder Chronic PTSD Visual Hallucinations  Past Medical History:  Past Medical History:  Diagnosis Date  . Anxiety   . Bipolar disorder ([place])   . Chronic UTI   . Depression   . Endometriosis   . HSV (herpes simplex virus) anogenital infection   . PTSD (post-traumatic stress disorder)     Past Surgical History:  Procedure Laterality Date  . CESAREAN SECTION N/A 02/17/2018   Procedure: CESAREAN SECTION;  Surgeon: Sanjuana Kava, MD;  Location: [place];  Service: Obstetrics;  Laterality: N/A;  . DIAGNOSTIC LAPAROSCOPY  2014  . DILATION AND EVACUATION N/A 03/11/2015   Procedure: DILATATION AND EVACUATION;  Surgeon: Waymon Amato, MD;  Location: [facility];  Service: Gynecology;  Laterality: N/A;  . TONSILLECTOMY  2015    Family Psychiatric History: Mother - Major Depressive Disorder Sister - Obsessive Compulsive Disorder Aunt (maternal) - Bipolar I Disorder Uncle (maternal) - Bipolar I Disorder  Family History:  Family History  Family history unknown: Yes    Social History:  Social History   Socioeconomic History  . Marital status: Married    Spouse name: Azura Tufaro  . Number of children: 1  . Years of education: Not on file  . Highest education level: Some college, no degree  Occupational History  . Not on file  Tobacco Use  . Smoking status: Former Smoker    Packs/day: 1.00    Types: Cigarettes    Quit date: 03/05/2017    Years since quitting: 3.8  . Smokeless tobacco: Current User  Substance and Sexual Activity  . Alcohol use: No  . Drug use: Yes    Types: Heroin, Cocaine, Marijuana    Comment:  Clean since Aug 2015  . Sexual activity: Not Currently  Other Topics Concern  . Not on file  Social History Narrative  . Not on file   Social Determinants of Health   Financial Resource Strain: Not on file  Food Insecurity: Not on file  Transportation Needs: Not on file  Physical Activity: Not on file  Stress: Not on file  Social Connections: Not on file    Allergies:  Allergies  Allergen Reactions  . Adhesive [Tape] Hives  . Ciprofloxacin Hives    Metabolic Disorder Labs: Lab Results  Component Value Date   HGBA1C 4.6 (L) 09/11/2020   MPG 85.32 09/11/2020   No results found for: PROLACTIN Lab Results  Component Value Date   CHOL 137 09/11/2020   TRIG 40 09/11/2020   HDL 42 09/11/2020   CHOLHDL 3.3 09/11/2020   VLDL 8 09/11/2020   LDLCALC 87 09/11/2020   LDLCALC 90 11/13/2019   Lab Results  Component Value Date   TSH 0.784 09/11/2020   TSH 0.68 06/21/2020    Therapeutic Level Labs: No results found for: LITHIUM No  results found for: VALPROATE No components found for:  CBMZ  Current Medications: Current Outpatient Medications  Medication Sig Dispense Refill  . escitalopram (LEXAPRO) 10 MG tablet Take 1 tablet (10 mg total) by mouth daily. 30 tablet 1  . FLUoxetine (PROZAC) 20 MG capsule Take 1 capsule (20 mg total) by mouth daily. 3 capsule 0  . FLUoxetine (PROZAC) 40 MG capsule Take 1 capsule (40 mg total) by mouth daily. 30 capsule 1  . folic acid (FOLVITE) 1 MG tablet Take 1 mg by mouth daily.    Marland Kitchen gabapentin (NEURONTIN) 300 MG capsule Take 1 capsule (300 mg total) by mouth 3 (three) times daily. 90 capsule 2  . lamoTRIgine (LAMICTAL) 100 MG tablet Take 1 tablet (100 mg total) by mouth 2 (two) times daily. 60 tablet 1  . OLANZapine (ZYPREXA) 7.5 MG tablet Take 1 tablet (7.5 mg total) by mouth at bedtime. 30 tablet 1  . prazosin (MINIPRESS) 1 MG capsule Take 3 capsules (3 mg total) by mouth at bedtime. 90 capsule 0  . Specialty Vitamins Products  (MAGNESIUM, AMINO ACID CHELATE,) 133 MG tablet Take 1 tablet by mouth 2 (two) times daily.    . valACYclovir (VALTREX) 1000 MG tablet Take 1,000 mg by mouth daily.     No current facility-administered medications for this visit.     Musculoskeletal: Strength & Muscle Tone: Unable to assess due to telemedicine visit [place]: Unable to assess due to telemedicine visit Patient leans: Unable to assess due to telemedicine visit  Psychiatric Specialty Exam: Review of Systems  Psychiatric/Behavioral: Positive for behavioral problems and dysphoric mood. Negative for decreased concentration, hallucinations, self-injury, sleep disturbance and suicidal ideas. The patient is nervous/anxious. The patient is not hyperactive.     There were no vitals taken for this visit.There is no height or weight on file to calculate BMI.  General Appearance: Unable to assess due to telemedicine visit  Eye Contact:  Unable to assess due to telemedicine visit  Speech:  Clear and Coherent and Normal Rate  Volume:  Normal  Mood:  Anxious and Dysphoric  Affect:  Congruent and Depressed  Thought Process:  Coherent, Goal Directed and Descriptions of Associations: Intact  Orientation:  Full (Time, Place, and Person)  Thought Content: WDL and Paranoid Ideation   Suicidal Thoughts:  No  Homicidal Thoughts:  No  Memory:  Immediate;   Good Recent;   Good Remote;   Good  Judgement:  Good  Insight:  Fair  Psychomotor Activity:  Normal  Concentration:  Concentration: Good and Attention Span: Good  Recall:  Good  Fund of Knowledge: Good  Language: Good  Akathisia:  NA  Handed:  Ambidextrous  AIMS (if indicated): not done  Assets:  Communication Skills Desire for Improvement Housing Social Support Vocational/Educational  ADL's:  Intact  Cognition: WNL  Sleep:  Good   Screenings: AIMS   Flowsheet Row Admission (Discharged) from OP Visit from 09/10/2020 in [facility]  Admission (Discharged) from OP Visit from 11/12/2019 in [facility]  AIMS Total Score 0 0    AUDIT   Flowsheet Row Admission (Discharged) from OP Visit from 09/10/2020 in [facility] Admission (Discharged) from OP Visit from 11/12/2019 in [facility]  Alcohol Use Disorder Identification Test Final Score (AUDIT) 0 0    GAD-7   Flowsheet Row Video Visit from 01/03/2021 in [facility] Counselor from 09/23/2020  in [facility]  Total GAD-7 Score 18 21    PHQ2-9   Flowsheet Row Video Visit from 01/03/2021 in [facility] Counselor from 09/23/2020 in [facility] Office Visit from 11/12/2019 in [facility] [place]  PHQ-2 Total Score 4 4 3   PHQ-9 Total Score 16 16 12     Flowsheet Row Video Visit from 01/03/2021 in [facility] Admission (Discharged) from OP Visit from 09/10/2020 in [facility] Admission (Discharged) from OP Visit from 11/12/2019 in [facility]  C-SSRS RISK CATEGORY Low Risk Error: Q2 is Yes, you must answer 3, 4, and 5 Moderate Risk       Assessment and Plan:  Karen [name] is a 30 year old with a past psychiatric history significant for bipolar 1 disorder, chronic anxiety, PTSD, and visual hallucinations presents to [facility] via virtual telephone visit for follow-up and medication management.  Today, patient reports that she has been experiencing fluctuating mood, instances of dissociation, and paranoia.  During her instances of paranoia, patient states that she feels like her friends are secretly against her.  Patient states that her friends have also started to notice that she will space out for long periods of time.  Patient was  recommended staying on her current dose of olanzapine to see if her instances of paranoia subside.    Patient was informed that her instances of dissociation could be a result of the side effects from her fluoxetine.  Patient was recommended discontinuing fluoxetine via tapering and to start escitalopram 10 mg daily for the management of her depressive episodes.  Patient was agreeable to recommendations.  Patient's gabapentin dosage was changed from 200 mg 3 times daily to 300 mg 3 times daily for the management of her anxiety.  Patient was agreeable to recommendation.  1. Bipolar 1 disorder ([place]) Patient to continue taking olanzapine 7.5 mg at bedtime for the management of her paranoia  - FLUoxetine (PROZAC) 20 MG capsule; Take 1 capsule (20 mg total) by mouth daily.  Dispense: 3 capsule; Refill: 0 - lamoTRIgine (LAMICTAL) 100 MG tablet; Take 1 tablet (100 mg total) by mouth 2 (two) times daily.  Dispense: 60 tablet; Refill: 1 - escitalopram (LEXAPRO) 10 MG tablet; Take 1 tablet (10 mg total) by mouth daily.  Dispense: 30 tablet; Refill: 1  2. Chronic anxiety  - FLUoxetine (PROZAC) 20 MG capsule; Take 1 capsule (20 mg total) by mouth daily.  Dispense: 3 capsule; Refill: 0 - gabapentin (NEURONTIN) 300 MG capsule; Take 1 capsule (300 mg total) by mouth 3 (three) times daily.  Dispense: 90 capsule; Refill: 2 - escitalopram (LEXAPRO) 10 MG tablet; Take 1 tablet (10 mg total) by mouth daily.  Dispense: 30 tablet; Refill: 1  3. Chronic post-traumatic stress disorder (PTSD)  - FLUoxetine (PROZAC) 20 MG capsule; Take 1 capsule (20 mg total) by mouth daily.  Dispense: 3 capsule; Refill: 0 - escitalopram (LEXAPRO) 10 MG tablet; Take 1 tablet (10 mg total) by mouth daily.  Dispense: 30 tablet; Refill: 1  4. Visual hallucinations Patient to continue taking olanzapine 7.5 mg at bedtime for the management of her visual hallucinations  Patient to follow up in 6 weeks  [name], PA 01/03/2021, 9:44  PM

## 2021-01-18 ENCOUNTER — Telehealth (HOSPITAL_COMMUNITY): Payer: Self-pay | Admitting: *Deleted

## 2021-01-18 NOTE — Telephone Encounter (Signed)
Provider was contacted by Davina Poke, RN regarding patient's concern over use of Zyprexa. Patient was contacted at around 5:20 pm today but there was no answer. A message was left for the patient. Patient will be contacted tomorrow to discuss concerns.

## 2021-01-18 NOTE — Telephone Encounter (Signed)
Call from patient stating she is not adjusting to the Zyprexa, she is still feeling very tired and unable to drive in the am. She is complaining of mood swings, disassociating frequently, and not feeling stable in general. Will forward concern to [facility] for direction.

## 2021-02-15 ENCOUNTER — Telehealth (HOSPITAL_COMMUNITY): Payer: 59 | Admitting: Physician Assistant

## 2021-02-15 ENCOUNTER — Other Ambulatory Visit: Payer: Self-pay

## 2021-02-15 ENCOUNTER — Telehealth (HOSPITAL_COMMUNITY): Payer: 59 | Admitting: Family

## 2021-02-15 NOTE — Progress Notes (Unsigned)
NP attempted to contact patient via [place] and telephone. No answer. Patient to reschedule.

## 2021-02-17 ENCOUNTER — Encounter: Payer: Self-pay | Admitting: Internal Medicine

## 2021-02-21 MED ORDER — VALACYCLOVIR HCL 1 G PO TABS
1000.0000 mg | ORAL_TABLET | Freq: Every day | ORAL | 1 refills | Status: DC
Start: 2021-02-21 — End: 2021-11-22

## 2021-03-17 ENCOUNTER — Telehealth (HOSPITAL_COMMUNITY): Payer: Self-pay | Admitting: *Deleted

## 2021-03-17 NOTE — Telephone Encounter (Signed)
Rx Refill Request:  escitalopram (LEXAPRO) 10 MG tablet

## 2021-03-17 NOTE — Telephone Encounter (Signed)
Pt called to schedule with provider.  Appointment scheduled for 04/18/21 virtual with U. [place].   Pt requesting refill LEXAPRO and LAMOTRIGENE

## 2021-03-18 ENCOUNTER — Other Ambulatory Visit (HOSPITAL_COMMUNITY): Payer: Self-pay | Admitting: Physician Assistant

## 2021-03-18 DIAGNOSIS — F4312 Post-traumatic stress disorder, chronic: Secondary | ICD-10-CM

## 2021-03-18 DIAGNOSIS — F419 Anxiety disorder, unspecified: Secondary | ICD-10-CM

## 2021-03-18 DIAGNOSIS — F319 Bipolar disorder, unspecified: Secondary | ICD-10-CM

## 2021-03-18 MED ORDER — ESCITALOPRAM OXALATE 10 MG PO TABS: 10.0000 mg | ORAL_TABLET | Freq: Every day | ORAL | 1 refills | Status: DC

## 2021-03-18 MED ORDER — LAMOTRIGINE 100 MG PO TABS: 100.0000 mg | ORAL_TABLET | Freq: Two times a day (BID) | ORAL | 1 refills | Status: DC

## 2021-03-18 NOTE — Telephone Encounter (Signed)
Provider was contacted by Eliezer Lofts, RMA and Arloa Koh regarding medication refills for the patient. Patient medications to be e-prescribed to pharmacy of choice.

## 2021-03-18 NOTE — Progress Notes (Signed)
Provider was contacted by Direce E McIntyre, RMA and Carla Bracken regarding medication refills for the patient. Patient medications to be e-prescribed to pharmacy of choice.

## 2021-04-04 ENCOUNTER — Ambulatory Visit (INDEPENDENT_AMBULATORY_CARE_PROVIDER_SITE_OTHER): Payer: 59 | Admitting: Physician Assistant

## 2021-04-04 ENCOUNTER — Other Ambulatory Visit: Payer: Self-pay

## 2021-04-04 ENCOUNTER — Encounter: Payer: Self-pay | Admitting: Physician Assistant

## 2021-04-04 VITALS — Wt 120.0 lb

## 2021-04-04 DIAGNOSIS — L700 Acne vulgaris: Secondary | ICD-10-CM

## 2021-04-04 DIAGNOSIS — B079 Viral wart, unspecified: Secondary | ICD-10-CM | POA: Diagnosis not present

## 2021-04-04 LAB — POCT URINE PREGNANCY: Preg Test, Ur: NEGATIVE

## 2021-04-04 NOTE — Progress Notes (Signed)
   New Patient   Subjective  Karen [name] is a 30 y.o. female who presents for the following: New Patient (Initial Visit) (Patient here today for acne x 10 years. Per patient she's tried Doxy x 6 months and topicals for scarring for acne from previous Dermatologist. Per patient it's been over 10 years since she saw the Dermatologist so she's not sure of the practice name. No personal history or family history of atypical moles, melanoma or non mole skin cancer.). She is very self conscious about her appearance with acne.  The following portions of the chart were reviewed this encounter and updated as appropriate:    Tobacco  Allergies  Meds  Problems  Med Hx  Surg Hx  Fam Hx       Objective  Well appearing patient in no apparent distress; mood and affect are within normal limits.  A focused examination was performed including face. Relevant physical exam findings are noted in the Assessment and Plan.  Objective  Both Cheeks, forehead: Red papules/cysts with post inflammatory hyperpigmentation. Scarring is present.  Images      Objective  Right 2nd Finger: Verrucous papules -- Discussed viral etiology and contagion.   Assessment & Plan  Acne vulgaris Both Cheeks, forehead  Start Isotretinoin.  POCT urine pregnancy - Both Cheeks, forehead  Viral warts, unspecified type Right 2nd Finger  Destruction of lesion - Right 2nd Finger Complexity: simple   Destruction method: cryotherapy   Informed consent: discussed and consent obtained   Timeout:  patient name, date of birth, surgical site, and procedure verified Lesion destroyed using liquid nitrogen: Yes   Cryotherapy cycles:  3 Outcome: patient tolerated procedure well with no complications    I, Irem Stoneham, PA-C, have reviewed all documentation for this visit. The documentation on 04/04/21 for the exam, diagnosis, procedures, and orders are all accurate and complete.

## 2021-04-18 ENCOUNTER — Telehealth (INDEPENDENT_AMBULATORY_CARE_PROVIDER_SITE_OTHER): Payer: 59 | Admitting: Physician Assistant

## 2021-04-18 DIAGNOSIS — F4312 Post-traumatic stress disorder, chronic: Secondary | ICD-10-CM | POA: Diagnosis not present

## 2021-04-18 DIAGNOSIS — F319 Bipolar disorder, unspecified: Secondary | ICD-10-CM

## 2021-04-18 DIAGNOSIS — F419 Anxiety disorder, unspecified: Secondary | ICD-10-CM | POA: Diagnosis not present

## 2021-04-18 NOTE — Progress Notes (Signed)
[facility] MD/PA/NP OP Progress Note  Virtual Visit via Telephone Note  I connected with Karen [name] on 04/18/21 at  4:00 PM EDT by telephone and verified that I am speaking with the correct person using two identifiers.  Location: Patient: Home Provider: Clinic   I discussed the limitations, risks, security and privacy concerns of performing an evaluation and management service by telephone and the availability of in person appointments. I also discussed with the patient that there may be a patient responsible charge related to this service. The patient expressed understanding and agreed to proceed.  Follow Up Instructions:  I discussed the assessment and treatment plan with the patient. The patient was provided an opportunity to ask questions and all were answered. The patient agreed with the plan and demonstrated an understanding of the instructions.   The patient was advised to call back or seek an in-person evaluation if the symptoms worsen or if the condition fails to improve as anticipated.  I provided 21 minutes of non-face-to-face time during this encounter.  [name], PA   04/18/2021 5:27 PM Karen [name]  MRN:  [id number]  Chief Complaint: Follow up and medication management  HPI:   Karen [name] is a 30 year old female with a past psychiatric history significant for bipolar disorder, chronic anxiety, PTSD, and visual hallucinations who presents to [facility] via virtual telephone visit for follow-up medication management.  Patient is currently being managed on the following medications:  Lamotrigine 100 mg 2 times daily Escitalopram 10 mg daily Gabapentin 300 mg 3 times daily Olanzapine 7.5 mg at bedtime  Patient reports that she has not been taking her olanzapine due to feeling groggy when waking.  Patient reports that even after receiving 9 hours of sleep when taking olanzapine, she still feels uncomfortable  driving to work at times due to her grogginess.  Patient reports that she was late for work a few days ago because she did not feel safe driving in her current state.  Patient reports that her life has been overwhelming as of late and that she is too bad days away from being admitted.  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  She also reports that her father has terminal brain cancer.  Patient denies the following symptoms: agitation/irritability, hyperactivity, and hallucinations.  Patient reports that she is able to get her activities of daily living completed.  Patient denies daytime anxiety and states that her gabapentin has been helpful in that regard.  Patient states that at night, her anxiety is overwhelming and often experiences panic attacks characterized by crying spells and hyperventilating.  Patient does endorse some depression but states that it is mostly situational and attributes it to her dressers.  A PHQ-9 screen was performed with the patient scoring a 5.  A GAD-7 screen was also performed with the patient scoring an 18.  Patient is calm, cooperative, and fully engaged in conversation during the encounter.  Patient states that she is currently upset and feels anxious.  Patient denies suicidal or homicidal ideations.  She further denies auditory or visual hallucinations.  Patient endorses good sleep and receives on average 7 to 8 hours of sleep each night.  Patient endorses fair appetite and eats at least 1 meal with a couple of snacks during the day.  Patient denies alcohol consumption, tobacco use, and illicit drug use.  Visit  Diagnosis:    ICD-10-CM   1. Bipolar 1 disorder (HCC)  F31.9 lamoTRIgine (LAMICTAL) 100 MG tablet    escitalopram (LEXAPRO) 20 MG tablet    ARIPiprazole (ABILIFY) 5 MG tablet    2. Chronic anxiety  F41.9 gabapentin (NEURONTIN) 300 MG capsule    escitalopram (LEXAPRO)  20 MG tablet    3. Chronic post-traumatic stress disorder (PTSD)  F43.12 escitalopram (LEXAPRO) 20 MG tablet      Past Psychiatric History:  Bipolar disorder Generalized anxiety disorder Visual hallucinations Chronic PTSD  Past Medical History:  Past Medical History:  Diagnosis Date   Anxiety    Bipolar disorder ([place])    Chronic UTI    Depression    Endometriosis    HSV (herpes simplex virus) anogenital infection    PTSD (post-traumatic stress disorder)     Past Surgical History:  Procedure Laterality Date   CESAREAN SECTION N/A 02/17/2018   Procedure: CESAREAN SECTION;  Surgeon: Sanjuana Kava, MD;  Location: [facility];  Service: Obstetrics;  Laterality: N/A;   DIAGNOSTIC LAPAROSCOPY  2014   DILATION AND EVACUATION N/A 03/11/2015   Procedure: DILATATION AND EVACUATION;  Surgeon: Waymon Amato, MD;  Location: [facility];  Service: Gynecology;  Laterality: N/A;   TONSILLECTOMY  2015    Family Psychiatric History:  Mother - Major Depressive Disorder Sister - Obsessive Compulsive Disorder Aunt (maternal) - Bipolar I Disorder Uncle (maternal) - Bipolar I Disorder  Family History:  Family History  Family history unknown: Yes    Social History:  Social History   Socioeconomic History   Marital status: Married    Spouse name: Cyann Venti   Number of children: 1   Years of education: Not on file   Highest education level: Some college, no degree  Occupational History   Not on file  Tobacco Use   Smoking status: Former    Packs/day: 1.00    Pack years: 0.00    Types: Cigarettes    Quit date: 03/05/2017    Years since quitting: 4.1   Smokeless tobacco: Current  Substance and Sexual Activity   Alcohol use: No   Drug use: Yes    Types: Heroin, Cocaine, Marijuana    Comment: Clean since Aug 2015   Sexual activity: Not Currently  Other Topics Concern   Not on file  Social History Narrative   Not on file   Social Determinants of Health   Financial Resource  Strain: Not on file  Food Insecurity: Not on file  Transportation Needs: Not on file  Physical Activity: Not on file  Stress: Not on file  Social Connections: Not on file    Allergies:  Allergies  Allergen Reactions   Ciprofloxacin Hives    Metabolic Disorder Labs: Lab Results  Component Value Date   HGBA1C 4.6 (L) 09/11/2020   MPG 85.32 09/11/2020   No results found for: PROLACTIN Lab Results  Component Value Date   CHOL 137 09/11/2020   TRIG 40 09/11/2020   HDL 42 09/11/2020   CHOLHDL 3.3 09/11/2020   VLDL 8 09/11/2020   LDLCALC 87 09/11/2020   LDLCALC 90 11/13/2019   Lab Results  Component Value Date   TSH 0.784 09/11/2020   TSH 0.68 06/21/2020    Therapeutic Level Labs: No results found for: LITHIUM No results found for: VALPROATE No components found for:  CBMZ  Current Medications: Current Outpatient Medications  Medication Sig Dispense Refill   ARIPiprazole (ABILIFY) 5 MG tablet Take 1 tablet (5 mg total)  by mouth daily. 30 tablet 1   escitalopram (LEXAPRO) 20 MG tablet Take 1 tablet (20 mg total) by mouth daily. 30 tablet 1   gabapentin (NEURONTIN) 300 MG capsule Take 1 capsule (300 mg total) by mouth 3 (three) times daily. 90 capsule 2   lamoTRIgine (LAMICTAL) 100 MG tablet Take 1 tablet (100 mg total) by mouth 2 (two) times daily. 60 tablet 1   valACYclovir (VALTREX) 1000 MG tablet Take 1 tablet (1,000 mg total) by mouth daily. 90 tablet 1   valACYclovir (VALTREX) 500 MG tablet Take 500 mg by mouth 2 (two) times daily.     No current facility-administered medications for this visit.     Musculoskeletal: Strength & Muscle Tone: Unable to assess due to telemedicine visit [place]: Unable to assess due to telemedicine visit Patient leans: Unable to assess due to telemedicine visit  Psychiatric Specialty Exam: Review of Systems  Psychiatric/Behavioral:  Positive for dysphoric mood. Negative for decreased concentration, hallucinations,  self-injury, sleep disturbance and suicidal ideas. The patient is nervous/anxious and is hyperactive.    There were no vitals taken for this visit.There is no height or weight on file to calculate BMI.  General Appearance: Unable to assess due to telemedicine visit  Eye Contact:  Unable to assess due to telemedicine visit  Speech:  Clear and Coherent and Normal Rate  Volume:  Normal  Mood:  Anxious, Depressed, Dysphoric, and Irritable  Affect:  Congruent and Depressed  Thought Process:  Coherent, Goal Directed, and Descriptions of Associations: Intact  Orientation:  Full (Time, Place, and Person)  Thought Content: WDL   Suicidal Thoughts:  No  Homicidal Thoughts:  No  Memory:  Immediate;   Good Recent;   Good Remote;   Good  Judgement:  Good  Insight:  Fair  Psychomotor Activity:  Normal  Concentration:  Concentration: Good and Attention Span: Good  Recall:  Good  Fund of Knowledge: Good  Language: Good  Akathisia:  NA  Handed:  Ambidextrous  AIMS (if indicated): not done  Assets:  Communication Skills Desire for Improvement Housing Social Support Vocational/Educational  ADL's:  Intact  Cognition: WNL  Sleep:  Good   Screenings: AIMS    Flowsheet Row Admission (Discharged) from OP Visit from 09/10/2020 in [place] [facility] [facility] Admission (Discharged) from OP Visit from 11/12/2019 in [place] [facility]  AIMS Total Score 0 0      AUDIT    Flowsheet Row Admission (Discharged) from OP Visit from 09/10/2020 in [place] [facility] Admission (Discharged) from OP Visit from 11/12/2019 in [place] [facility]  Alcohol Use Disorder Identification Test Final Score (AUDIT) 0 0      GAD-7    Flowsheet Row Video Visit from 04/18/2021 in [facility] Video Visit from 01/03/2021 in [facility] Counselor from 09/23/2020 in [facility]  Total GAD-7 Score 18 18 21       PHQ2-9    Flowsheet Row Video Visit from 04/18/2021 in [facility] Video Visit from 01/03/2021 in [facility] Counselor from 09/23/2020 in [facility] Office Visit from 11/12/2019 in [place] [facility] [place]  PHQ-2 Total Score 2 4 4 3   PHQ-9 Total Score 5 16 16 12       Flowsheet Row Video Visit from 04/18/2021 in [facility] Video Visit from 01/03/2021  in [facility] Admission (Discharged) from OP Visit from 09/10/2020 in [place] [facility]  C-SSRS RISK CATEGORY Moderate Risk Low Risk Error: Q2 is Yes, you must answer 3, 4, and 5        Assessment and Plan:   Karen [name] is a 30 year old female with a past psychiatric history significant for bipolar disorder, chronic anxiety, PTSD, and visual hallucinations who presents to [facility] via virtual telephone visit for follow-up medication management.  Patient reports that she is currently going through a number of stressors that are contributing to her depression and anxiety.  Patient states that she has discontinued taking olanzapine due to experiencing grogginess when awaking.  Patient reports that she has been on the following medications in the past Cymbalta, Abilify, and Seroquel.  Patient is willing to rechallenge Abilify for the management of her bipolar disorder.  Patient to be placed on Abilify 5 mg daily.  Patient was also recommended increasing her escitalopram dosage from 10 mg to 20 mg daily for the management of her depressive symptoms and anxiety.  Patient is agreeable to plan.  Patient's medications to be e-prescribed to pharmacy of choice.  A [place] Suicide Severity Rating Scale was performed with the patient being considered moderate risk. Patient  denies being a danger to herself and is able to contract for safety.   Patient was educated on diet and exercise  1. Bipolar 1 disorder (HCC)  - lamoTRIgine (LAMICTAL) 100 MG tablet; Take 1 tablet (100 mg total) by mouth 2 (two) times daily.  Dispense: 60 tablet; Refill: 1 - escitalopram (LEXAPRO) 20 MG tablet; Take 1 tablet (20 mg total) by mouth daily.  Dispense: 30 tablet; Refill: 1 - ARIPiprazole (ABILIFY) 5 MG tablet; Take 1 tablet (5 mg total) by mouth daily.  Dispense: 30 tablet; Refill: 1  2. Chronic anxiety  - gabapentin (NEURONTIN) 300 MG capsule; Take 1 capsule (300 mg total) by mouth 3 (three) times daily.  Dispense: 90 capsule; Refill: 2 - escitalopram (LEXAPRO) 20 MG tablet; Take 1 tablet (20 mg total) by mouth daily.  Dispense: 30 tablet; Refill: 1  3. Chronic post-traumatic stress disorder (PTSD)  - escitalopram (LEXAPRO) 20 MG tablet; Take 1 tablet (20 mg total) by mouth daily.  Dispense: 30 tablet; Refill: 1  Patient to follow up in 6 weeks Provider spent a total of 30 minutes speaking to patient/reviewing patient's chart  [name], PA 04/18/2021, 5:27 PM

## 2021-04-19 ENCOUNTER — Encounter (HOSPITAL_COMMUNITY): Payer: Self-pay | Admitting: Physician Assistant

## 2021-04-19 MED ORDER — ARIPIPRAZOLE 5 MG PO TABS: 5.0000 mg | ORAL_TABLET | Freq: Every day | ORAL | 1 refills | Status: DC

## 2021-04-19 MED ORDER — LAMOTRIGINE 100 MG PO TABS: 100.0000 mg | ORAL_TABLET | Freq: Two times a day (BID) | ORAL | 1 refills | Status: DC

## 2021-04-19 MED ORDER — GABAPENTIN 300 MG PO CAPS: 300.0000 mg | ORAL_CAPSULE | Freq: Three times a day (TID) | ORAL | 2 refills | Status: DC

## 2021-04-19 MED ORDER — ESCITALOPRAM OXALATE 20 MG PO TABS: 20.0000 mg | ORAL_TABLET | Freq: Every day | ORAL | 1 refills | Status: DC

## 2021-05-02 ENCOUNTER — Other Ambulatory Visit (HOSPITAL_COMMUNITY): Payer: Self-pay

## 2021-05-02 MED ORDER — MEDROXYPROGESTERONE ACETATE 150 MG/ML IM SUSP
INTRAMUSCULAR | 3 refills | Status: DC
  Filled 2021-05-02: qty 1, 90d supply, fill #0

## 2021-05-02 MED ORDER — DOXYCYCLINE HYCLATE 100 MG PO CAPS
ORAL_CAPSULE | ORAL | 0 refills | Status: DC
  Filled 2021-05-02: qty 28, 14d supply, fill #0

## 2021-05-02 MED ORDER — METRONIDAZOLE 500 MG PO TABS
ORAL_TABLET | ORAL | 0 refills | Status: DC
  Filled 2021-05-02: qty 28, 14d supply, fill #0

## 2021-05-07 ENCOUNTER — Encounter (HOSPITAL_COMMUNITY): Payer: Self-pay

## 2021-05-07 ENCOUNTER — Emergency Department (HOSPITAL_COMMUNITY): Payer: 59

## 2021-05-07 ENCOUNTER — Emergency Department (HOSPITAL_COMMUNITY)
Admission: EM | Admit: 2021-05-07 | Discharge: 2021-05-07 | Disposition: A | Discharge status: Home / Self Care | Payer: 59 | Attending: Emergency Medicine | Admitting: Emergency Medicine

## 2021-05-07 ENCOUNTER — Other Ambulatory Visit: Payer: Self-pay

## 2021-05-07 DIAGNOSIS — Z87891 Personal history of nicotine dependence: Secondary | ICD-10-CM | POA: Diagnosis not present

## 2021-05-07 DIAGNOSIS — R300 Dysuria: Secondary | ICD-10-CM | POA: Diagnosis present

## 2021-05-07 DIAGNOSIS — R102 Pelvic and perineal pain: Secondary | ICD-10-CM | POA: Diagnosis not present

## 2021-05-07 LAB — CBC WITH DIFFERENTIAL/PLATELET
Abs Immature Granulocytes: 0.01 10*3/uL (ref 0.00–0.07)
Basophils Absolute: 0.1 10*3/uL (ref 0.0–0.1)
Basophils Relative: 2 %
Eosinophils Absolute: 0.4 10*3/uL (ref 0.0–0.5)
Eosinophils Relative: 8 %
HCT: 39.8 % (ref 36.0–46.0)
Hemoglobin: 13.8 g/dL (ref 12.0–15.0)
Immature Granulocytes: 0 %
Lymphocytes Relative: 36 %
Lymphs Abs: 1.8 10*3/uL (ref 0.7–4.0)
MCH: 30.5 pg (ref 26.0–34.0)
MCHC: 34.7 g/dL (ref 30.0–36.0)
MCV: 88.1 fL (ref 80.0–100.0)
Monocytes Absolute: 0.4 10*3/uL (ref 0.1–1.0)
Monocytes Relative: 8 %
Neutro Abs: 2.4 10*3/uL (ref 1.7–7.7)
Neutrophils Relative %: 46 %
Platelets: 273 10*3/uL (ref 150–400)
RBC: 4.52 MIL/uL (ref 3.87–5.11)
RDW: 11.6 % (ref 11.5–15.5)
WBC: 5.1 10*3/uL (ref 4.0–10.5)
nRBC: 0 % (ref 0.0–0.2)

## 2021-05-07 LAB — URINALYSIS, ROUTINE W REFLEX MICROSCOPIC
Bilirubin Urine: NEGATIVE
Glucose, UA: NEGATIVE mg/dL
Hgb urine dipstick: NEGATIVE
Ketones, ur: 20 mg/dL — AB
Leukocytes,Ua: NEGATIVE
Nitrite: NEGATIVE
Protein, ur: NEGATIVE mg/dL
Specific Gravity, Urine: 1.028 (ref 1.005–1.030)
pH: 6 (ref 5.0–8.0)

## 2021-05-07 LAB — COMPREHENSIVE METABOLIC PANEL
ALT: 11 U/L (ref 0–44)
AST: 15 U/L (ref 15–41)
Albumin: 4.6 g/dL (ref 3.5–5.0)
Alkaline Phosphatase: 46 U/L (ref 38–126)
Anion gap: 7 (ref 5–15)
BUN: 18 mg/dL (ref 6–20)
CO2: 24 mmol/L (ref 22–32)
Calcium: 8.9 mg/dL (ref 8.9–10.3)
Chloride: 106 mmol/L (ref 98–111)
Creatinine, Ser: 0.61 mg/dL (ref 0.44–1.00)
GFR, Estimated: >60 mL/min (ref >60)
Glucose, Bld: 87 mg/dL (ref 70–99)
Potassium: 3.7 mmol/L (ref 3.5–5.1)
Sodium: 137 mmol/L (ref 135–145)
Total Bilirubin: 0.7 mg/dL (ref 0.3–1.2)
Total Protein: 7.4 g/dL (ref 6.5–8.1)

## 2021-05-07 LAB — WET PREP, GENITAL
Clue Cells Wet Prep HPF POC: NONE SEEN
Sperm: NONE SEEN
Trich, Wet Prep: NONE SEEN
Yeast Wet Prep HPF POC: NONE SEEN

## 2021-05-07 LAB — I-STAT BETA HCG BLOOD, ED (MC, WL, AP ONLY): I-stat hCG, quantitative: <5 m[IU]/mL (ref <5)

## 2021-05-07 LAB — LIPASE, BLOOD: Lipase: 29 U/L (ref 11–51)

## 2021-05-07 MED ORDER — ONDANSETRON HCL 4 MG/2ML IJ SOLN
4.0000 mg | Freq: Once | INTRAMUSCULAR | Status: AC
  Administered 2021-05-07: 4 mg via INTRAVENOUS
  Filled 2021-05-07: qty 2

## 2021-05-07 MED ORDER — METHOCARBAMOL 500 MG PO TABS: 500.0000 mg | ORAL_TABLET | Freq: Two times a day (BID) | ORAL | 0 refills | Status: DC

## 2021-05-07 MED ORDER — KETOROLAC TROMETHAMINE 30 MG/ML IJ SOLN
30.0000 mg | Freq: Once | INTRAMUSCULAR | Status: AC
  Administered 2021-05-07: 30 mg via INTRAVENOUS
  Filled 2021-05-07: qty 1

## 2021-05-07 MED ORDER — ONDANSETRON 4 MG PO TBDP: 4.0000 mg | ORAL_TABLET | Freq: Three times a day (TID) | ORAL | 0 refills | Status: DC | PRN

## 2021-05-07 MED ORDER — IOHEXOL 350 MG/ML SOLN
75.0000 mL | Freq: Once | INTRAVENOUS | Status: AC | PRN
  Administered 2021-05-07: 75 mL via INTRAVENOUS

## 2021-05-07 MED ORDER — MORPHINE SULFATE (PF) 4 MG/ML IV SOLN
4.0000 mg | Freq: Once | INTRAVENOUS | Status: AC
  Administered 2021-05-07: 4 mg via INTRAVENOUS
  Filled 2021-05-07: qty 1

## 2021-05-07 MED ORDER — SODIUM CHLORIDE 0.9 % IV BOLUS
1000.0000 mL | Freq: Once | INTRAVENOUS | Status: AC
  Administered 2021-05-07: 1000 mL via INTRAVENOUS

## 2021-05-07 NOTE — ED Notes (Signed)
US at bedside

## 2021-05-07 NOTE — ED Notes (Signed)
Resting with eyes closed.

## 2021-05-07 NOTE — ED Provider Notes (Signed)
[facility] DEPT Provider Note   CSN: [id number] Arrival date & time: 05/07/21  0865     History Chief Complaint  Patient presents with   Dysuria   Abdominal Pain   Urinary Retention    Karen [name] is a 30 y.o. female.  HPI Patient is a [age]-year-old female with past medical history significant for endometriosis, depression, chronic UTI, herpes simplex, PTSD  Patient is presented to the ER today with complaints of some dysuria as well as some urinary hesitancy over the past 4 days.  She states that she also has had worsening pelvic pain since she was seen by her OB/GYN and diagnosed with PID 7 days ago.  She states that she had her IUD removed.  States that she has had a crampy severe lower pelvic pain.  She states it is achy constant has gotten progressively worse to the point where she feels that she cannot get around to do her daily activities.  She states that she has had some nausea but no vomiting.  Denies any chest pain or shortness of breath.  No injuries to her abdomen pelvis.  She states that she was tested for STDs by her OB/GYN and was found to be negative.  She denies any fevers or chills.  She states that she has had some vaginal discharge but not significant amount/usual amount.  She is currently being treated for PID and states that she is taken her medications as prescribed.  She states that her bowel movements have been normal she has not had any diarrhea or constipation.     Past Medical History:  Diagnosis Date   Anxiety    Bipolar disorder ([place])    Chronic UTI    Depression    Endometriosis    HSV (herpes simplex virus) anogenital infection    PTSD (post-traumatic stress disorder)     Patient Active Problem List   Diagnosis Date Noted   Visual hallucinations 10/05/2020   Chronic post-traumatic stress disorder (PTSD) 09/11/2020   MDD (major depressive disorder), recurrent episode, severe ([place]) 09/10/2020   Bipolar  depression ([place]) 11/13/2019   S/P cesarean section 02/17/2018   Bipolar 1 disorder ([place]) 03/10/2015   Chronic depression 03/10/2015   Chronic anxiety 03/10/2015   Recurrent UTI 03/10/2015   Allergy history, drug--Cipro 03/10/2015   Hx of substance abuse--staying at [facility] 03/10/2015   Back pain 03/10/2015    Past Surgical History:  Procedure Laterality Date   CESAREAN SECTION N/A 02/17/2018   Procedure: CESAREAN SECTION;  Surgeon: Sanjuana Kava, MD;  Location: [facility];  Service: Obstetrics;  Laterality: N/A;   COLONOSCOPY     DIAGNOSTIC LAPAROSCOPY  11/05/2012   DILATION AND EVACUATION N/A 03/11/2015   Procedure: DILATATION AND EVACUATION;  Surgeon: Waymon Amato, MD;  Location: [facility];  Service: Gynecology;  Laterality: N/A;   DRUG INDUCED ENDOSCOPY     LAPAROSCOPY     TONSILLECTOMY  11/05/2013     OB History     Gravida  2   Para  1   Term  1   Preterm      AB  1   Living  1      SAB  1   IAB      Ectopic      Multiple  0   Live Births  1           Family History  Problem Relation Age of Onset   Rheum arthritis Mother  Cancer Father     Social History   Tobacco Use   Smoking status: Former    Packs/day: 1.00    Pack years: 0.00    Types: Cigarettes    Quit date: 03/05/2017    Years since quitting: 4.1   Smokeless tobacco: Current  Vaping Use   Vaping Use: Some days   Substances: Nicotine, Flavoring  Substance Use Topics   Alcohol use: Not Currently   Drug use: Not Currently    Types: Heroin, Cocaine, Marijuana    Comment: Clean since Aug 2015    Home Medications Prior to Admission medications   Medication Sig Start Date End Date Taking? Authorizing Provider  acetaminophen (TYLENOL) 500 MG tablet Take 1,000 mg by mouth every 6 (six) hours as needed for mild pain or moderate pain.   Yes [provider]  ARIPiprazole (ABILIFY) 5 MG tablet Take 1 tablet (5 mg total) by mouth daily. 04/19/21 04/19/22 Yes Nwoko,  Terese Door, PA  doxycycline (VIBRAMYCIN) 100 MG capsule Take 1 capsule by mouth 2 times a day for 14 days 05/02/21  Yes   escitalopram (LEXAPRO) 20 MG tablet Take 1 tablet (20 mg total) by mouth daily. 04/19/21 04/19/22 Yes Nwoko, Terese Door, PA  gabapentin (NEURONTIN) 300 MG capsule Take 1 capsule (300 mg total) by mouth 3 (three) times daily. 04/19/21  Yes Nwoko, Terese Door, PA  lamoTRIgine (LAMICTAL) 100 MG tablet Take 1 tablet (100 mg total) by mouth 2 (two) times daily. 04/19/21  Yes Nwoko, Terese Door, PA  medroxyPROGESTERone (DEPO-PROVERA) 150 MG/ML injection Inject 1 ml Intramuscular every 3 months 05/02/21  Yes   metroNIDAZOLE (FLAGYL) 500 MG tablet Take 1 tablet by mouth 2 times a day for 14 days 05/02/21  Yes   traMADol (ULTRAM) 50 MG tablet Take 50 mg by mouth 3 (three) times daily as needed for pain. 04/29/21  Yes [provider]  valACYclovir (VALTREX) 1000 MG tablet Take 1 tablet (1,000 mg total) by mouth daily. Patient not taking: No sig reported 02/21/21   Isaac Bliss, Rayford Halsted, MD    Allergies    Ciprofloxacin  Review of Systems   Review of Systems  Constitutional:  Negative for chills and fever.  HENT:  Negative for congestion and ear pain.   Eyes:  Negative for pain.  Respiratory:  Negative for cough and shortness of breath.   Cardiovascular:  Negative for chest pain.  Gastrointestinal:  Positive for abdominal pain and nausea. Negative for blood in stool, constipation, diarrhea and vomiting.  Endocrine: Negative for polydipsia and polyuria.  Genitourinary:  Positive for difficulty urinating. Negative for dysuria and hematuria.  Musculoskeletal:  Negative for myalgias.  Neurological:  Negative for dizziness and headaches.   Physical Exam Updated Vital Signs BP 104/62   Pulse 78   Temp 98.1 F (36.7 C) (Oral)   Resp 18   Ht 5\' 1"  (1.549 m)   Wt 51.3 kg   SpO2 99%   BMI 21.35 kg/m   Physical Exam Vitals and nursing note reviewed.  Constitutional:       General: She is not in acute distress. HENT:     Head: Normocephalic and atraumatic.     Nose: Nose normal.  Eyes:     General: No scleral icterus. Cardiovascular:     Rate and Rhythm: Normal rate and regular rhythm.     Pulses: Normal pulses.     Heart sounds: Normal heart sounds.  Pulmonary:     Effort: Pulmonary effort is  normal. No respiratory distress.     Breath sounds: No wheezing.  Abdominal:     Palpations: Abdomen is soft.     Tenderness: There is abdominal tenderness.     Comments: Abdomen is soft, there is tenderness to palpation diffusely across the lower abdomen.  There is no guarding or rebound tenderness.  Genitourinary:    Comments: Diffusely tender on pelvic examination.  She has continued cervical motion tenderness but no focal tenderness to palpation of either ovary. Musculoskeletal:     Cervical back: Normal range of motion.     Right lower leg: No edema.     Left lower leg: No edema.  Skin:    General: Skin is warm and dry.     Capillary Refill: Capillary refill takes less than 2 seconds.  Neurological:     Mental Status: She is alert. Mental status is at baseline.  Psychiatric:        Mood and Affect: Mood normal.        Behavior: Behavior normal.    ED Results / Procedures / Treatments   Labs (all labs ordered are listed, but only abnormal results are displayed) Labs Reviewed  WET PREP, GENITAL - Abnormal; Notable for the following components:      Result Value   WBC, Wet Prep HPF POC FEW (*)    All other components within normal limits  URINALYSIS, ROUTINE W REFLEX MICROSCOPIC - Abnormal; Notable for the following components:   Ketones, ur 20 (*)    All other components within normal limits  CBC WITH DIFFERENTIAL/PLATELET  COMPREHENSIVE METABOLIC PANEL  LIPASE, BLOOD  I-STAT BETA HCG BLOOD, ED ([facility], [facility], [facility] ONLY)  GC/CHLAMYDIA PROBE AMP ([place]) NOT AT [facility]    EKG None  Radiology US Transvaginal Non-OB  Result Date:  05/07/2021 CLINICAL DATA:  Severe pelvic pain. EXAM: TRANSABDOMINAL AND TRANSVAGINAL ULTRASOUND OF PELVIS DOPPLER ULTRASOUND OF OVARIES TECHNIQUE: Both transabdominal and transvaginal ultrasound examinations of the pelvis were performed. Transabdominal technique was performed for global imaging of the pelvis including uterus, ovaries, adnexal regions, and pelvic cul-de-sac. It was necessary to proceed with endovaginal exam following the transabdominal exam to visualize the endometrium and ovaries. Color and duplex Doppler ultrasound was utilized to evaluate blood flow to the ovaries. COMPARISON:  July 16, 2017. FINDINGS: Uterus Measurements: 8.6 x 4.5 x 4.2 cm = volume: 84 mL. 2.5 cm fibroid is noted anteriorly. Endometrium Thickness: 5 mm which is within normal limits. No focal abnormality visualized. Right ovary Measurements: 3.1 x 3.0 x 2.1 cm = volume: 12 mL. Normal appearance/no adnexal mass. Left ovary Measurements: 4.0 x 3.1 x 2.6 cm = volume: 17 mL. 2.1 cm follicle is noted. Pulsed Doppler evaluation of both ovaries demonstrates normal low-resistance arterial and venous waveforms. Other findings Trace free fluid is noted which most likely is physiologic. IMPRESSION: No evidence of ovarian mass or torsion. 2.5 cm uterine fibroid is noted. No other significant abnormality seen in the pelvis. Electronically Signed   By: Marijo Conception M.D.   On: 05/07/2021 15:52   US Pelvis Complete  Result Date: 05/07/2021 CLINICAL DATA:  Severe pelvic pain. EXAM: TRANSABDOMINAL AND TRANSVAGINAL ULTRASOUND OF PELVIS DOPPLER ULTRASOUND OF OVARIES TECHNIQUE: Both transabdominal and transvaginal ultrasound examinations of the pelvis were performed. Transabdominal technique was performed for global imaging of the pelvis including uterus, ovaries, adnexal regions, and pelvic cul-de-sac. It was necessary to proceed with endovaginal exam following the transabdominal exam to visualize the endometrium and ovaries. Color and  duplex Doppler ultrasound was utilized to evaluate blood flow to the ovaries. COMPARISON:  July 16, 2017. FINDINGS: Uterus Measurements: 8.6 x 4.5 x 4.2 cm = volume: 84 mL. 2.5 cm fibroid is noted anteriorly. Endometrium Thickness: 5 mm which is within normal limits. No focal abnormality visualized. Right ovary Measurements: 3.1 x 3.0 x 2.1 cm = volume: 12 mL. Normal appearance/no adnexal mass. Left ovary Measurements: 4.0 x 3.1 x 2.6 cm = volume: 17 mL. 2.1 cm follicle is noted. Pulsed Doppler evaluation of both ovaries demonstrates normal low-resistance arterial and venous waveforms. Other findings Trace free fluid is noted which most likely is physiologic. IMPRESSION: No evidence of ovarian mass or torsion. 2.5 cm uterine fibroid is noted. No other significant abnormality seen in the pelvis. Electronically Signed   By: Marijo Conception M.D.   On: 05/07/2021 15:52   [place] Art/Ven Flow Abd Pelv Doppler  Result Date: 05/07/2021 CLINICAL DATA:  Severe pelvic pain. EXAM: TRANSABDOMINAL AND TRANSVAGINAL ULTRASOUND OF PELVIS DOPPLER ULTRASOUND OF OVARIES TECHNIQUE: Both transabdominal and transvaginal ultrasound examinations of the pelvis were performed. Transabdominal technique was performed for global imaging of the pelvis including uterus, ovaries, adnexal regions, and pelvic cul-de-sac. It was necessary to proceed with endovaginal exam following the transabdominal exam to visualize the endometrium and ovaries. Color and duplex Doppler ultrasound was utilized to evaluate blood flow to the ovaries. COMPARISON:  July 16, 2017. FINDINGS: Uterus Measurements: 8.6 x 4.5 x 4.2 cm = volume: 84 mL. 2.5 cm fibroid is noted anteriorly. Endometrium Thickness: 5 mm which is within normal limits. No focal abnormality visualized. Right ovary Measurements: 3.1 x 3.0 x 2.1 cm = volume: 12 mL. Normal appearance/no adnexal mass. Left ovary Measurements: 4.0 x 3.1 x 2.6 cm = volume: 17 mL. 2.1 cm follicle is noted. Pulsed  Doppler evaluation of both ovaries demonstrates normal low-resistance arterial and venous waveforms. Other findings Trace free fluid is noted which most likely is physiologic. IMPRESSION: No evidence of ovarian mass or torsion. 2.5 cm uterine fibroid is noted. No other significant abnormality seen in the pelvis. Electronically Signed   By: Marijo Conception M.D.   On: 05/07/2021 15:52    Procedures Procedures   Medications Ordered in ED Medications  sodium chloride 0.9 % bolus 1,000 mL (0 mLs Intravenous Stopped 05/07/21 1351)  morphine 4 MG/ML injection 4 mg (4 mg Intravenous Given 05/07/21 1247)  ondansetron (ZOFRAN) injection 4 mg (4 mg Intravenous Given 05/07/21 1247)    ED Course  I have reviewed the triage vital signs and the nursing notes.  Pertinent labs & imaging results that were available during my care of the patient were reviewed by me and considered in my medical decision making (see chart for details).  Patient is uncomfortable appearing 30 year old female presented today after being diagnosed with PID approximately 1 week ago she had her IUD removed and was started on antibiotics appropriately.  She states that she has had continued pain since this time and has gotten worse.  She is afebrile not tachycardic but appears quite uncomfortable pelvic exam is quite uncomfortable for her.   Abdominal exam is notable for some lower abdominal/pelvic tenderness to palpation.  Notably her symptoms been ongoing for approximately 1 week and she was actually having abdominal pain prior to her diagnosis of PID.  Suspect that this is either tubo-ovarian abscess or perhaps chronic cystitis/other chronic condition.  Ultimately will rule out torsion or tubo-ovarian abscess with ultrasonography  CMP unremarkable, CBC unremarkable, lipase within  normal limits, wet prep with only few WBCs.  GC chlamydia pending this was recollected for completeness sake.  I am unable to review OB/GYN work-up.  Urinalysis  unremarkable.  Few ketones present likely from decreased p.o. intake because of pain.  Suspect the patient will be able to be discharged home with pain medication recommendations and with follow-up with her OB/GYN.  Relatively low suspicion for other acute intra-abdominal infection or pathology.   Patient signed out to Arlean Hopping at shift change who will follow up on ultrasound and determine need for additional imaging.  Patient reevaluated prior to discharge is aware of shift change.  Feels much improved after morphine Zofran and fluids.    MDM Rule Final Clinical Impression(s) / ED Diagnoses Final diagnoses:  None    Rx / DC Orders ED Discharge Orders     None        Tedd Sias, [place] 05/07/21 1625    Wyvonnia Dusky, MD 05/07/21 [phone]

## 2021-05-07 NOTE — ED Triage Notes (Signed)
Patient reports that she is currently taking antibiotics for PID due to an IUD and that was removed less than a week ago. Patient now c/o dysuria and states that when she goes to the bathroom she only "trickles" for the past 4 days.

## 2021-05-07 NOTE — ED Provider Notes (Signed)
Karen [name] is a 30 y.o. female, presenting to the ED with lower abdominal and pelvic pain.  HPI from [facility], PA-C: "Patient is a [age]-year-old female with past medical history significant for endometriosis, depression, chronic UTI, herpes simplex, PTSD  Patient is presented to the ER today with complaints of some dysuria as well as some urinary hesitancy over the past 4 days.  She states that she also has had worsening pelvic pain since she was seen by her OB/GYN and diagnosed with PID 7 days ago.  She states that she had her IUD removed.  States that she has had a crampy severe lower pelvic pain.  She states it is achy constant has gotten progressively worse to the point where she feels that she cannot get around to do her daily activities.  She states that she has had some nausea but no vomiting.  Denies any chest pain or shortness of breath.  No injuries to her abdomen pelvis.  She states that she was tested for STDs by her OB/GYN and was found to be negative.   She denies any fevers or chills.  She states that she has had some vaginal discharge but not significant amount/usual amount.  She is currently being treated for PID and states that she is taken her medications as prescribed.  She states that her bowel movements have been normal she has not had any diarrhea or constipation."   Past Medical History:  Diagnosis Date   Anxiety    Bipolar disorder (HCC)    Chronic UTI    Depression    Endometriosis    HSV (herpes simplex virus) anogenital infection    PTSD (post-traumatic stress disorder)        Physical Exam  BP 114/63   Pulse 68   Temp 98.1 F (36.7 C) (Oral)   Resp 18   Ht 5\' 1"  (1.549 m)   Wt 51.3 kg   SpO2 100%   BMI 21.35 kg/m   Physical Exam Vitals and nursing note reviewed.  Constitutional:      General: She is not in acute distress.    Appearance: She is well-developed. She is not diaphoretic.  HENT:     Head: Normocephalic and atraumatic.      Mouth/Throat:     Mouth: Mucous membranes are moist.     Pharynx: Oropharynx is clear.  Eyes:     Conjunctiva/sclera: Conjunctivae normal.  Cardiovascular:     Rate and Rhythm: Normal rate and regular rhythm.     Pulses: Normal pulses.  Pulmonary:     Effort: Pulmonary effort is normal. No respiratory distress.  Abdominal:     Palpations: Abdomen is soft.     Tenderness: There is abdominal tenderness in the suprapubic area. There is no guarding.  Musculoskeletal:     Cervical back: Neck supple.  Skin:    General: Skin is warm and dry.  Neurological:     Mental Status: She is alert.  Psychiatric:        Mood and Affect: Mood and affect normal.        Speech: Speech normal.        Behavior: Behavior normal.    ED Course/Procedures    Procedures  Abnormal Labs Reviewed  WET PREP, GENITAL - Abnormal; Notable for the following components:      Result Value   WBC, Wet Prep HPF POC FEW (*)    All other components within normal limits  URINALYSIS, ROUTINE W REFLEX  MICROSCOPIC - Abnormal; Notable for the following components:   Ketones, ur 20 (*)    All other components within normal limits    US Transvaginal Non-OB  Result Date: 05/07/2021 CLINICAL DATA:  Severe pelvic pain. EXAM: TRANSABDOMINAL AND TRANSVAGINAL ULTRASOUND OF PELVIS DOPPLER ULTRASOUND OF OVARIES TECHNIQUE: Both transabdominal and transvaginal ultrasound examinations of the pelvis were performed. Transabdominal technique was performed for global imaging of the pelvis including uterus, ovaries, adnexal regions, and pelvic cul-de-sac. It was necessary to proceed with endovaginal exam following the transabdominal exam to visualize the endometrium and ovaries. Color and duplex Doppler ultrasound was utilized to evaluate blood flow to the ovaries. COMPARISON:  July 16, 2017. FINDINGS: Uterus Measurements: 8.6 x 4.5 x 4.2 cm = volume: 84 mL. 2.5 cm fibroid is noted anteriorly. Endometrium Thickness: 5 mm which is  within normal limits. No focal abnormality visualized. Right ovary Measurements: 3.1 x 3.0 x 2.1 cm = volume: 12 mL. Normal appearance/no adnexal mass. Left ovary Measurements: 4.0 x 3.1 x 2.6 cm = volume: 17 mL. 2.1 cm follicle is noted. Pulsed Doppler evaluation of both ovaries demonstrates normal low-resistance arterial and venous waveforms. Other findings Trace free fluid is noted which most likely is physiologic. IMPRESSION: No evidence of ovarian mass or torsion. 2.5 cm uterine fibroid is noted. No other significant abnormality seen in the pelvis. Electronically Signed   By: Marijo Conception M.D.   On: 05/07/2021 15:52   US Pelvis Complete  Result Date: 05/07/2021 CLINICAL DATA:  Severe pelvic pain. EXAM: TRANSABDOMINAL AND TRANSVAGINAL ULTRASOUND OF PELVIS DOPPLER ULTRASOUND OF OVARIES TECHNIQUE: Both transabdominal and transvaginal ultrasound examinations of the pelvis were performed. Transabdominal technique was performed for global imaging of the pelvis including uterus, ovaries, adnexal regions, and pelvic cul-de-sac. It was necessary to proceed with endovaginal exam following the transabdominal exam to visualize the endometrium and ovaries. Color and duplex Doppler ultrasound was utilized to evaluate blood flow to the ovaries. COMPARISON:  July 16, 2017. FINDINGS: Uterus Measurements: 8.6 x 4.5 x 4.2 cm = volume: 84 mL. 2.5 cm fibroid is noted anteriorly. Endometrium Thickness: 5 mm which is within normal limits. No focal abnormality visualized. Right ovary Measurements: 3.1 x 3.0 x 2.1 cm = volume: 12 mL. Normal appearance/no adnexal mass. Left ovary Measurements: 4.0 x 3.1 x 2.6 cm = volume: 17 mL. 2.1 cm follicle is noted. Pulsed Doppler evaluation of both ovaries demonstrates normal low-resistance arterial and venous waveforms. Other findings Trace free fluid is noted which most likely is physiologic. IMPRESSION: No evidence of ovarian mass or torsion. 2.5 cm uterine fibroid is noted. No other  significant abnormality seen in the pelvis. Electronically Signed   By: Marijo Conception M.D.   On: 05/07/2021 15:52   CT ABDOMEN PELVIS W CONTRAST  Result Date: 05/07/2021 CLINICAL DATA:  Pelvic pain. Pelvic inflammatory disease potentially related to IUD removed 1 week prior. Dysuria. EXAM: CT ABDOMEN AND PELVIS WITH CONTRAST TECHNIQUE: Multidetector CT imaging of the abdomen and pelvis was performed using the standard protocol following bolus administration of intravenous contrast. CONTRAST:  33mL OMNIPAQUE IOHEXOL 350 MG/ML SOLN COMPARISON:  None. FINDINGS: Lower chest: Lung bases clear. Hepatobiliary: . liver normal.  Normal gallbladder. Pancreas: Pancreas is normal. No ductal dilatation. No pancreatic inflammation. Spleen: Normal spleen Adrenals/urinary tract: Adrenal glands are normal. The kidneys enhance symmetrically. No hydronephrosis or hydroureter. Bladder normal Stomach/Bowel: Stomach, small bowel, appendix, and cecum are normal. The colon and rectosigmoid colon are normal. Vascular/Lymphatic: Abdominal aorta is normal  caliber. No periportal or retroperitoneal adenopathy. No pelvic adenopathy. Reproductive: heterogeneous enhancement of the uterine wall which nonspecific. Ovaries are normal. No free fluid. Other: No free fluid. Musculoskeletal: No aggressive osseous lesion. IMPRESSION: 1. Heterogeneous enhancement of the uterus is nonspecific. 2. Ovaries appear normal. 3. Normal appendix. 4. No obstructive uropathy. Electronically Signed   By: Suzy Bouchard M.D.   On: 05/07/2021 17:39   [place] Art/Ven Flow Abd Pelv Doppler  Result Date: 05/07/2021 CLINICAL DATA:  Severe pelvic pain. EXAM: TRANSABDOMINAL AND TRANSVAGINAL ULTRASOUND OF PELVIS DOPPLER ULTRASOUND OF OVARIES TECHNIQUE: Both transabdominal and transvaginal ultrasound examinations of the pelvis were performed. Transabdominal technique was performed for global imaging of the pelvis including uterus, ovaries, adnexal regions, and pelvic  cul-de-sac. It was necessary to proceed with endovaginal exam following the transabdominal exam to visualize the endometrium and ovaries. Color and duplex Doppler ultrasound was utilized to evaluate blood flow to the ovaries. COMPARISON:  July 16, 2017. FINDINGS: Uterus Measurements: 8.6 x 4.5 x 4.2 cm = volume: 84 mL. 2.5 cm fibroid is noted anteriorly. Endometrium Thickness: 5 mm which is within normal limits. No focal abnormality visualized. Right ovary Measurements: 3.1 x 3.0 x 2.1 cm = volume: 12 mL. Normal appearance/no adnexal mass. Left ovary Measurements: 4.0 x 3.1 x 2.6 cm = volume: 17 mL. 2.1 cm follicle is noted. Pulsed Doppler evaluation of both ovaries demonstrates normal low-resistance arterial and venous waveforms. Other findings Trace free fluid is noted which most likely is physiologic. IMPRESSION: No evidence of ovarian mass or torsion. 2.5 cm uterine fibroid is noted. No other significant abnormality seen in the pelvis. Electronically Signed   By: Marijo Conception M.D.   On: 05/07/2021 15:52      MDM       Patient care handoff report received from [facility], PA-C. Plan: Awaiting pelvic ultrasound.  Started on ABX for PID by OBGYN June 28.  Lab results overall reassuring. Uterine fibroids on ultrasound.  CT overall unremarkable. Patient to follow-up with OB/GYN.  Since she is experiencing intravaginal cramping and tightening making it difficult to urinate, suspects there is a chance she may be experiencing spasms.  We discussed intravaginal diazepam as a possible solution, however, she is hesitant to use benzodiazepines on the mucous membrane where she may experience more systemic or bloodstream absorption due to a previous history with benzodiazepine addiction (clean for 7 years).  She opted to try for muscle relaxer instead.   Vitals:   05/07/21 1200 05/07/21 1230 05/07/21 1300 05/07/21 1435  BP: 105/66 99/63 108/67 114/63  Pulse: 64 66 63 68  Resp: 18 18 18 18    Temp:      TempSrc:      SpO2: 100% 100% 100% 100%  Weight:      Height:           Layla Maw 05/08/21 0027    Luna Fuse, MD 05/12/21 [phone]

## 2021-05-07 NOTE — Discharge Instructions (Addendum)
  There was evidence of possible uterine fibroids which can be quite painful. Be sure to drink plenty water to stay well-hydrated. Acetaminophen: May take acetaminophen (generic for Tylenol), as needed, for pain. Your daily total maximum amount of acetaminophen from all sources should be limited to 4000mg /day for persons without liver problems, or 2000mg /day for those with liver problems.  Methocarbamol: Methocarbamol (generic for Robaxin) is a muscle relaxer and can help relieve stiff muscles or muscle spasms.  Do not drive or perform other dangerous activities while taking this medication as it can cause drowsiness as well as changes in reaction time and judgement.  Nausea/vomiting: Use the ondansetron (generic for Zofran) for nausea or vomiting.  This medication may not prevent all vomiting or nausea, but can help facilitate better hydration. Things that can help with nausea/vomiting also include peppermint/menthol candies, vitamin B12, and ginger.  Follow-up: Recommend follow-up with OB/GYN on this matter.  Follow-up with UroGYN specialist or second opinion from a tertiary center [place], [place], [place], etc.) may be warranted.  Referral for pelvic physical therapy may also be indicated.

## 2021-05-09 ENCOUNTER — Ambulatory Visit (INDEPENDENT_AMBULATORY_CARE_PROVIDER_SITE_OTHER): Payer: 59 | Admitting: Dermatology

## 2021-05-09 ENCOUNTER — Encounter: Payer: Self-pay | Admitting: Dermatology

## 2021-05-09 ENCOUNTER — Other Ambulatory Visit: Payer: Self-pay

## 2021-05-09 VITALS — Wt 113.0 lb

## 2021-05-09 DIAGNOSIS — L700 Acne vulgaris: Secondary | ICD-10-CM | POA: Diagnosis not present

## 2021-05-09 LAB — GC/CHLAMYDIA PROBE AMP (~~LOC~~) NOT AT ARMC
Chlamydia: NEGATIVE
Comment: NEGATIVE
Comment: NORMAL
Neisseria Gonorrhea: NEGATIVE

## 2021-05-09 MED ORDER — ISOTRETINOIN 30 MG PO CAPS: 30.0000 mg | ORAL_CAPSULE | Freq: Two times a day (BID) | ORAL | 0 refills | Status: DC

## 2021-05-09 MED ORDER — ISOTRETINOIN 30 MG PO CAPS: 30.0000 mg | ORAL_CAPSULE | Freq: Every day | ORAL | 0 refills | Status: DC

## 2021-05-10 LAB — LIPID PANEL
Cholesterol: 126 mg/dL (ref <200)
HDL: 35 mg/dL — ABNORMAL LOW (ref >=50)
LDL Cholesterol (Calc): 78 mg/dL (calc)
Non-HDL Cholesterol (Calc): 91 mg/dL (calc) (ref <130)
Total CHOL/HDL Ratio: 3.6 (calc) (ref <5.0)
Triglycerides: 54 mg/dL (ref <150)

## 2021-05-10 LAB — CBC WITH DIFFERENTIAL/PLATELET
Absolute Monocytes: 398 cells/uL (ref 200–950)
Basophils Absolute: 90 cells/uL (ref 0–200)
Basophils Relative: 1.6 %
Eosinophils Absolute: 510 cells/uL — ABNORMAL HIGH (ref 15–500)
Eosinophils Relative: 9.1 %
HCT: 40.4 % (ref 35.0–45.0)
Hemoglobin: 13.6 g/dL (ref 11.7–15.5)
Lymphs Abs: 2274 cells/uL (ref 850–3900)
MCH: 29.8 pg (ref 27.0–33.0)
MCHC: 33.7 g/dL (ref 32.0–36.0)
MCV: 88.4 fL (ref 80.0–100.0)
MPV: 9.6 fL (ref 7.5–12.5)
Monocytes Relative: 7.1 %
Neutro Abs: 2330 cells/uL (ref 1500–7800)
Neutrophils Relative %: 41.6 %
Platelets: 303 10*3/uL (ref 140–400)
RBC: 4.57 10*6/uL (ref 3.80–5.10)
RDW: 11.8 % (ref 11.0–15.0)
Total Lymphocyte: 40.6 %
WBC: 5.6 10*3/uL (ref 3.8–10.8)

## 2021-05-10 LAB — COMPREHENSIVE METABOLIC PANEL
AG Ratio: 1.6 (calc) (ref 1.0–2.5)
ALT: 8 U/L (ref 6–29)
AST: 12 U/L (ref 10–30)
Albumin: 4.4 g/dL (ref 3.6–5.1)
Alkaline phosphatase (APISO): 46 U/L (ref 31–125)
BUN: 11 mg/dL (ref 7–25)
CO2: 23 mmol/L (ref 20–32)
Calcium: 9.3 mg/dL (ref 8.6–10.2)
Chloride: 104 mmol/L (ref 98–110)
Creat: 0.63 mg/dL (ref 0.50–1.10)
Globulin: 2.7 g/dL (calc) (ref 1.9–3.7)
Glucose, Bld: 87 mg/dL (ref 65–99)
Potassium: 3.8 mmol/L (ref 3.5–5.3)
Sodium: 138 mmol/L (ref 135–146)
Total Bilirubin: 0.3 mg/dL (ref 0.2–1.2)
Total Protein: 7.1 g/dL (ref 6.1–8.1)

## 2021-05-10 LAB — HCG, SERUM, QUALITATIVE: Preg, Serum: NEGATIVE

## 2021-05-15 ENCOUNTER — Ambulatory Visit (HOSPITAL_COMMUNITY): Payer: 59

## 2021-05-22 ENCOUNTER — Encounter: Payer: Self-pay | Admitting: Dermatology

## 2021-05-22 NOTE — Progress Notes (Signed)
   Follow-Up Visit   Subjective  Karen [name] is a 30 y.o. female who presents for the following: Acne (First isotret rx).  Cystic acne Location:  Duration:  Quality:  Associated Signs/Symptoms: Modifying Factors:  Severity:  Timing: Context:   Objective  Well appearing patient in no apparent distress; mood and affect are within normal limits. Head - Anterior (Face) Severe diffuse deep inflammatory acne with high risk of scar.  Essentially all treatment options again reviewed and patient does want to proceed with isotretinoin therapy.  Discussion of controversy relating to the effective this medication on mood and Dexter's history of bipolar 1 disorder.  Emphasized need for strict I pledge compliance.  Sun protection.         A focused examination was performed including head, neck, upper torso. Relevant physical exam findings are noted in the Assessment and Plan.   Assessment & Plan    Acne vulgaris Head - Anterior (Face)  We will initiate therapy with just 30 mg isotretinoin daily.  She knows she can contact me anytime with any issues.  CBC with Differential/Platelet - Head - Anterior (Face)  Comprehensive metabolic panel - Head - Anterior (Face)  Lipid panel - Head - Anterior (Face)  hCG, serum, qualitative - Head - Anterior (Face)  ISOtretinoin (ACCUTANE) 30 MG capsule - Head - Anterior (Face) Take 1 capsule (30 mg total) by mouth daily.      I, Lavonna Monarch, MD, have reviewed all documentation for this visit.  The documentation on 05/22/21 for the exam, diagnosis, procedures, and orders are all accurate and complete.

## 2021-05-26 ENCOUNTER — Other Ambulatory Visit: Payer: Self-pay | Admitting: Obstetrics & Gynecology

## 2021-06-01 ENCOUNTER — Telehealth (INDEPENDENT_AMBULATORY_CARE_PROVIDER_SITE_OTHER): Payer: 59 | Admitting: Physician Assistant

## 2021-06-01 ENCOUNTER — Other Ambulatory Visit: Payer: Self-pay

## 2021-06-01 DIAGNOSIS — F319 Bipolar disorder, unspecified: Secondary | ICD-10-CM | POA: Diagnosis not present

## 2021-06-01 DIAGNOSIS — F4312 Post-traumatic stress disorder, chronic: Secondary | ICD-10-CM

## 2021-06-01 DIAGNOSIS — F419 Anxiety disorder, unspecified: Secondary | ICD-10-CM | POA: Diagnosis not present

## 2021-06-02 ENCOUNTER — Encounter (HOSPITAL_COMMUNITY): Payer: Self-pay | Admitting: Physician Assistant

## 2021-06-02 MED ORDER — MIRTAZAPINE 7.5 MG PO TABS: 7.5000 mg | ORAL_TABLET | Freq: Every day | ORAL | 1 refills | Status: DC

## 2021-06-02 MED ORDER — ESCITALOPRAM OXALATE 5 MG PO TABS: 5.0000 mg | ORAL_TABLET | Freq: Every day | ORAL | 0 refills | Status: DC

## 2021-06-02 MED ORDER — ARIPIPRAZOLE 5 MG PO TABS: 5.0000 mg | ORAL_TABLET | Freq: Every day | ORAL | 1 refills | Status: DC

## 2021-06-02 MED ORDER — GABAPENTIN 400 MG PO CAPS: 400.0000 mg | ORAL_CAPSULE | Freq: Three times a day (TID) | ORAL | 2 refills | Status: DC

## 2021-06-02 MED ORDER — LAMOTRIGINE 100 MG PO TABS: 100.0000 mg | ORAL_TABLET | Freq: Two times a day (BID) | ORAL | 1 refills | Status: DC

## 2021-06-02 MED ORDER — VENLAFAXINE HCL ER 37.5 MG PO CP24
37.5000 mg | ORAL_CAPSULE | Freq: Every day | ORAL | 1 refills | Status: DC
Start: 2021-06-02 — End: 2021-09-06

## 2021-06-02 NOTE — Progress Notes (Signed)
[place] MD/PA/NP OP Progress Note  Virtual Visit via Video Note  I connected with Karen [name] on 06/01/21 at  2:00 PM EDT by a video enabled telemedicine application and verified that I am speaking with the correct person using two identifiers.  Location: Patient: Home Provider: Working remotely   I discussed the limitations of evaluation and management by telemedicine and the availability of in person appointments. The patient expressed understanding and agreed to proceed.  Follow Up Instructions:   I discussed the assessment and treatment plan with the patient. The patient was provided an opportunity to ask questions and all were answered. The patient agreed with the plan and demonstrated an understanding of the instructions.   The patient was advised to call back or seek an in-person evaluation if the symptoms worsen or if the condition fails to improve as anticipated.  I provided 25 minutes of non-face-to-face time during this encounter.  Karen Mood, PA   06/01/2021 2:32 PM Karen [name]  MRN:  [id number]  Chief Complaint: Follow up and medication management  HPI:   Karen [name] is a 30 year old female with a past psychiatric history significant for bipolar disorder, chronic anxiety, PTSD, and visual hallucinations who presents to [facility] via virtual video visit for follow-up and medication management.  Patient is currently being managed on the following medications:  Lamotrigine 100 mg 2 times daily Escitalopram 20 mg daily Abilify 5 mg daily Gabapentin 300 mg 3 times daily  Patient reports no issues or concerns regarding her current medication regimen but endorses real bad anxiety.  Patient states that her anxiety is mostly manageable, however, there are nights where she would rate her anxiety a 10 out of 10.  She reports that her anxiety is mainly situational and most likely related to nervousness due to upcoming  surgery scheduled for next Wednesday.  Patient also endorses depressive symptoms including lack of motivation, social withdrawal, and periods of isolation.  She reports that she is also experiencing frequent episodes of dissociation where she spaces out for a bit.  She describes the feeling as leaving her body and not coming back for a while.  She states that it is very frustrating for her significant other during these episodes.  A PHQ-9 screen was performed with the patient scoring a 23.  A GAD-7 screen was also performed with the patient scoring a 14.  Patient is alert and oriented x4, calm, cooperative, and fully engaged in conversation during the encounter.  Patient reports that she is mentally tired.  She denies suicidal or homicidal ideations.  She further denies auditory or visual hallucinations and does not appear to be responding to internal/external stimuli.  Patient endorses fair sleep and receives on average 5 hours of intermittent sleep.  Patient states that she wakes up nearly every hour.  Patient endorses fair appetite and eats on average 2 meals per day.  Patient denies alcohol consumption, tobacco use, and illicit drug use.  Visit Diagnosis:    ICD-10-CM   1. Bipolar 1 disorder (HCC)  F31.9 escitalopram (LEXAPRO) 5 MG tablet    ARIPiprazole (ABILIFY) 5 MG tablet    lamoTRIgine (LAMICTAL) 100 MG tablet    venlafaxine XR (EFFEXOR XR) 37.5 MG 24 hr capsule    mirtazapine (REMERON) 7.5 MG tablet    2. Chronic anxiety  F41.9 escitalopram (LEXAPRO) 5 MG tablet    gabapentin (NEURONTIN) 400 MG capsule    venlafaxine XR (EFFEXOR XR) 37.5 MG 24  hr capsule    mirtazapine (REMERON) 7.5 MG tablet    3. Chronic post-traumatic stress disorder (PTSD)  F43.12 escitalopram (LEXAPRO) 5 MG tablet    mirtazapine (REMERON) 7.5 MG tablet      Past Psychiatric History:  Bipolar disorder Generalized anxiety disorder Visual hallucinations Chronic PTSD  Past Medical History:  Past Medical  History:  Diagnosis Date   Anxiety    Bipolar disorder ([place])    Chronic UTI    Depression    Endometriosis    HSV (herpes simplex virus) anogenital infection    PTSD (post-traumatic stress disorder)     Past Surgical History:  Procedure Laterality Date   CESAREAN SECTION N/A 02/17/2018   Procedure: CESAREAN SECTION;  Surgeon: Sanjuana Kava, MD;  Location: [place];  Service: Obstetrics;  Laterality: N/A;   COLONOSCOPY     DIAGNOSTIC LAPAROSCOPY  11/05/2012   DILATION AND EVACUATION N/A 03/11/2015   Procedure: DILATATION AND EVACUATION;  Surgeon: Waymon Amato, MD;  Location: [facility];  Service: Gynecology;  Laterality: N/A;   DRUG INDUCED ENDOSCOPY     LAPAROSCOPY     TONSILLECTOMY  11/05/2013    Family Psychiatric History:  Mother - Major Depressive Disorder Sister - Obsessive Compulsive Disorder Aunt (maternal) - Bipolar I Disorder Uncle (maternal) - Bipolar I Disorder  Family History:  Family History  Problem Relation Age of Onset   Rheum arthritis Mother    Cancer Father     Social History:  Social History   Socioeconomic History   Marital status: Married    Spouse name: Ronnell Umphenour   Number of children: 1   Years of education: Not on file   Highest education level: Some college, no degree  Occupational History   Not on file  Tobacco Use   Smoking status: Former    Packs/day: 1.00    Types: Cigarettes    Quit date: 03/05/2017    Years since quitting: 4.2   Smokeless tobacco: Current  Vaping Use   Vaping Use: Some days   Substances: Nicotine, Flavoring  Substance and Sexual Activity   Alcohol use: Not Currently   Drug use: Not Currently    Types: Heroin, Cocaine, Marijuana    Comment: Clean since Aug 2015   Sexual activity: Not Currently  Other Topics Concern   Not on file  Social History Narrative   Not on file   Social Determinants of Health   Financial Resource Strain: Not on file  Food Insecurity: Not on file  Transportation Needs: Not on  file  Physical Activity: Not on file  Stress: Not on file  Social Connections: Not on file    Allergies:  Allergies  Allergen Reactions   Ciprofloxacin Hives    Metabolic Disorder Labs: Lab Results  Component Value Date   HGBA1C 4.6 (L) 09/11/2020   MPG 85.32 09/11/2020   No results found for: PROLACTIN Lab Results  Component Value Date   CHOL 126 05/09/2021   TRIG 54 05/09/2021   HDL 35 (L) 05/09/2021   CHOLHDL 3.6 05/09/2021   VLDL 8 09/11/2020   LDLCALC 78 05/09/2021   LDLCALC 87 09/11/2020   Lab Results  Component Value Date   TSH 0.784 09/11/2020   TSH 0.68 06/21/2020    Therapeutic Level Labs: No results found for: LITHIUM No results found for: VALPROATE No components found for:  CBMZ  Current Medications: Current Outpatient Medications  Medication Sig Dispense Refill   mirtazapine (REMERON) 7.5 MG tablet Take 1 tablet (7.5  mg total) by mouth at bedtime. 30 tablet 1   venlafaxine XR (EFFEXOR XR) 37.5 MG 24 hr capsule Take 1 capsule (37.5 mg total) by mouth daily. 30 capsule 1   ARIPiprazole (ABILIFY) 5 MG tablet Take 1 tablet (5 mg total) by mouth daily. 30 tablet 1   escitalopram (LEXAPRO) 5 MG tablet Take 1 tablet (5 mg total) by mouth daily. 3 tablet 0   gabapentin (NEURONTIN) 400 MG capsule Take 1 capsule (400 mg total) by mouth 3 (three) times daily. 90 capsule 2   ISOtretinoin (ACCUTANE) 30 MG capsule Take 1 capsule (30 mg total) by mouth daily. (Patient taking differently: Take 30 mg by mouth at bedtime.) 30 capsule 0   lamoTRIgine (LAMICTAL) 100 MG tablet Take 1 tablet (100 mg total) by mouth 2 (two) times daily. 60 tablet 1   medroxyPROGESTERone (DEPO-PROVERA) 150 MG/ML injection Inject 1 ml Intramuscular every 3 months (Patient taking differently: Inject 150 mg into the muscle every 3 (three) months.) 1 mL 3   methocarbamol (ROBAXIN) 500 MG tablet Take 1 tablet (500 mg total) by mouth 2 (two) times daily. (Patient taking differently: Take 500 mg by  mouth every 8 (eight) hours as needed (endometriosis).) 20 tablet 0   metroNIDAZOLE (FLAGYL) 500 MG tablet Take 1 tablet by mouth 2 times a day for 14 days (Patient not taking: Reported on 05/31/2021) 28 tablet 0   ondansetron (ZOFRAN ODT) 4 MG disintegrating tablet Take 1 tablet (4 mg total) by mouth every 8 (eight) hours as needed for nausea or vomiting. (Patient not taking: Reported on 05/31/2021) 20 tablet 0   ORILISSA 150 MG TABS Take 150 mg by mouth daily.     valACYclovir (VALTREX) 1000 MG tablet Take 1 tablet (1,000 mg total) by mouth daily. (Patient taking differently: Take 1,000 mg by mouth daily as needed (Break outs).) 90 tablet 1   No current facility-administered medications for this visit.     Musculoskeletal: Strength & Muscle Tone: Unable to assess due to telemedicine visit [place]: Unable to assess due to telemedicine visit Patient leans: Unable to assess due to telemedicine visit  Psychiatric Specialty Exam: Review of Systems  Psychiatric/Behavioral:  Positive for sleep disturbance. Negative for decreased concentration, dysphoric [name], hallucinations, self-injury and suicidal ideas. The patient is nervous/anxious. The patient is not hyperactive.    There were no vitals taken for this visit.There is no height or weight on file to calculate BMI.  General Appearance: Well Groomed  Eye Contact:  Good  Speech:  Clear and Coherent and Normal Rate  Volume:  Normal  [name]:  Anxious and Depressed  Affect:  Congruent and Depressed  Thought Process:  Coherent, Goal Directed, and Descriptions of Associations: Intact  Orientation:  Full (Time, Place, and Person)  Thought Content: WDL   Suicidal Thoughts:  No  Homicidal Thoughts:  No  Memory:  Immediate;   Good Recent;   Good Remote;   Good  Judgement:  Good  Insight:  Good  Psychomotor Activity:  Normal  Concentration:  Concentration: Good and Attention Span: Good  Recall:  Good  Fund of Knowledge: Good  Language:  Good  Akathisia:  NA  Handed:  Ambidextrous  AIMS (if indicated): not done  Assets:  Communication Skills Desire for Improvement Housing Social Support Vocational/Educational  ADL's:  Intact  Cognition: WNL  Sleep:  Good   Screenings: AIMS    Flowsheet Row Admission (Discharged) from OP Visit from 09/10/2020 in [place] [facility]  Admission (Discharged) from OP Visit from 11/12/2019 in [place] [facility]  AIMS Total Score 0 0      AUDIT    Flowsheet Row Admission (Discharged) from OP Visit from 09/10/2020 in [place] [facility] Admission (Discharged) from OP Visit from 11/12/2019 in [place] [facility]  Alcohol Use Disorder Identification Test Final Score (AUDIT) 0 0      GAD-7    Flowsheet Row Video Visit from 06/01/2021 in [facility] Video Visit from 04/18/2021 in [facility] Video Visit from 01/03/2021 in [facility] Counselor from 09/23/2020 in [facility]  Total GAD-7 Score '14 18 18 21      '$ PHQ2-9    Flowsheet Row Video Visit from 06/01/2021 in [facility] Video Visit from 04/18/2021 in [facility] Video Visit from 01/03/2021 in [facility] Counselor from 09/23/2020 in [facility] Office Visit from 11/12/2019 in [facility] [place]  PHQ-2 Total Score '6 2 4 4 3  '$ PHQ-9 Total Score '23 5 16 16 12      '$ Flowsheet Row Video Visit from 06/01/2021 in [facility] ED from 05/07/2021 in [place] DEPT Video Visit from 04/18/2021 in [place] Moderate Risk No Risk Moderate Risk        Assessment and Plan:   Karen [name] is a  30 year old female with a past psychiatric history significant for bipolar disorder, chronic anxiety, PTSD, and visual hallucinations who presents to [facility] via virtual video visit for follow-up and medication management.  Patient endorses worsening anxiety as well as  ongoing depressive symptoms with episodes of dissociation.  Patient to discontinue escitalopram and  start venlafaxine 37.5 mg daily along with mirtazapine 7.5 mg at bedtime for the management of anxiety, depression, and sleep disturbances. Patient to take Escitalopram 10 mg for 3 days, followed by 5 mg for 3 days before discontinuing. Patient's gabapentin dosage to be increased from 300 mg - 400 mg 3 times daily. Patient was agreeable to recommendations. Patient's medications to be e-prescribed to pharmacy of choice.  1. Bipolar 1 disorder (HCC)  - escitalopram (LEXAPRO) 5 MG tablet; Take 1 tablet (5 mg total) by mouth daily.  Dispense: 3 tablet; Refill: 0 - ARIPiprazole (ABILIFY) 5 MG tablet; Take 1 tablet (5 mg total) by mouth daily.  Dispense: 30 tablet; Refill: 1 - lamoTRIgine (LAMICTAL) 100 MG tablet; Take 1 tablet (100 mg total) by mouth 2 (two) times daily.  Dispense: 60 tablet; Refill: 1 - venlafaxine XR (EFFEXOR XR) 37.5 MG 24 hr capsule; Take 1 capsule (37.5 mg total) by mouth daily.  Dispense: 30 capsule; Refill: 1 - mirtazapine (REMERON) 7.5 MG tablet; Take 1 tablet (7.5 mg total) by mouth at bedtime.  Dispense: 30 tablet; Refill: 1  2. Chronic anxiety  - escitalopram (LEXAPRO) 5 MG tablet; Take 1 tablet (5 mg total) by mouth daily.  Dispense: 3 tablet; Refill: 0 - gabapentin (NEURONTIN) 400 MG capsule; Take 1 capsule (400 mg total) by mouth 3 (three) times daily.  Dispense: 90 capsule; Refill: 2 - venlafaxine XR (EFFEXOR XR) 37.5 MG 24 hr capsule; Take 1 capsule (37.5 mg total) by mouth daily.  Dispense: 30 capsule; Refill: 1 - mirtazapine (REMERON) 7.5 MG tablet; Take 1 tablet  (  7.5 mg total) by mouth at bedtime.  Dispense: 30 tablet; Refill: 1  3. Chronic post-traumatic stress disorder (PTSD)  - escitalopram (LEXAPRO) 5 MG tablet; Take 1 tablet (5 mg total) by mouth daily.  Dispense: 3 tablet; Refill: 0 - mirtazapine (REMERON) 7.5 MG tablet; Take 1 tablet (7.5 mg total) by mouth at bedtime.  Dispense: 30 tablet; Refill: 1  Patient to follow up in 7 weeks Provider spent a total of 25 minutes with the patient/reviewing patient's chart  Karen Mood, PA 06/01/2021, 2:32 PM

## 2021-06-05 ENCOUNTER — Encounter (HOSPITAL_COMMUNITY): Payer: Self-pay | Admitting: Obstetrics & Gynecology

## 2021-06-05 ENCOUNTER — Other Ambulatory Visit: Payer: Self-pay

## 2021-06-05 NOTE — Progress Notes (Signed)
Spoke with pt for pre-op call. Pt denies cardiac history, HTN or Diabetes.  Pt's surgery is scheduled as ambulatory so no Covid test is required prior to surgery. Pt denies Covid symptoms

## 2021-06-06 NOTE — H&P (Addendum)
Karen [name] is an 30 y.o. female G51P1011 with a history of chronic pelvic pain here for diagnostic laparoscopy, possible operative: possible ovarian cystectomy, possible ablation of endometriosis lesions, possible excision of endometriosis lesions. She had had an extensive work up for chronic pelvic pain which was essentially negative. She has tried [place] IUD, Freida Busman, over counter pain medication and muscle relants but and continues with pelvic pain.  She report a history of endometriosis that was diagnosed at about 30 years of age and was confirmed via laparoscopy at which point some endometriosis lesions were removed and she reports her pelvic pain symptoms improved after her laparoscopy then.  She reports recent pelvic pain is similar to prior occurrence and desires a repeat laparoscopy and ablation or excision of endometriosis lesions and removal of any adnexal masses.  She currently declines follow up with an infertility specialist but understands that if extensive endometriosis that cannot be fully excised is found that she will need to see a specialist for further management of her endometriosis.   Pertinent Gynecological History: Menses: regular every month without intermenstrual spotting before birth control use Bleeding: None currently Contraception: Depo-Provera injections DES exposure: unknown Blood transfusions: none Sexually transmitted diseases: no past history Previous GYN Procedures:  Suction Dilation and Curretage   Last mammogram:  N/A   Last pap: normal Date: 2021 OB History: G2, P1011   Menstrual History: No LMP recorded. Patient has had an injection.    Past Medical History:  Diagnosis Date   Anxiety    Bipolar disorder ([place])    Chronic UTI    Depression    Endometriosis    HSV (herpes simplex virus) anogenital infection    PTSD (post-traumatic stress disorder)     Past Surgical History:  Procedure Laterality Date   CESAREAN SECTION N/A 02/17/2018    Procedure: CESAREAN SECTION;  Surgeon: Sanjuana Kava, MD;  Location: [facility];  Service: Obstetrics;  Laterality: N/A;   COLONOSCOPY     DIAGNOSTIC LAPAROSCOPY  11/05/2012   DILATION AND EVACUATION N/A 03/11/2015   Procedure: DILATATION AND EVACUATION;  Surgeon: Waymon Amato, MD;  Location: [facility];  Service: Gynecology;  Laterality: N/A;   DRUG INDUCED ENDOSCOPY     LAPAROSCOPY     TONSILLECTOMY  11/05/2013    Family History  Problem Relation Age of Onset   Rheum arthritis Mother    Cancer Father     Social History:  reports that she quit smoking about 4 years ago. Her smoking use included cigarettes. She smoked an average of 1 pack per day. She has never used smokeless tobacco. She reports previous alcohol use. She reports previous drug use. Drugs: Heroin, Cocaine, and Marijuana.  No current facility-administered medications for this encounter.  Current Outpatient Medications:    medroxyPROGESTERone (DEPO-PROVERA) 150 MG/ML injection, Inject 1 ml Intramuscular every 3 months (Patient taking differently: Inject 150 mg into the muscle every 3 (three) months.), Disp: 1 mL, Rfl: 3   methocarbamol (ROBAXIN) 500 MG tablet, Take 1 tablet (500 mg total) by mouth 2 (two) times daily. (Patient taking differently: Take 500 mg by mouth every 8 (eight) hours as needed (endometriosis).), Disp: 20 tablet, Rfl: 0   ORILISSA 150 MG TABS, Take 150 mg by mouth daily., Disp: , Rfl:    valACYclovir (VALTREX) 1000 MG tablet, Take 1 tablet (1,000 mg total) by mouth daily. (Patient taking differently: Take 1,000 mg by mouth daily as needed (Break outs).), Disp: 90 tablet, Rfl: 1   ARIPiprazole (ABILIFY) 5  MG tablet, Take 1 tablet (5 mg total) by mouth daily., Disp: 30 tablet, Rfl: 1   escitalopram (LEXAPRO) 5 MG tablet, Take 1 tablet (5 mg total) by mouth daily., Disp: 3 tablet, Rfl: 0   gabapentin (NEURONTIN) 400 MG capsule, Take 1 capsule (400 mg total) by mouth 3 (three) times daily., Disp: 90 capsule,  Rfl: 2   ISOtretinoin (ACCUTANE) 30 MG capsule, Take 1 capsule (30 mg total) by mouth daily. (Patient taking differently: Take 30 mg by mouth at bedtime.), Disp: 30 capsule, Rfl: 0   lamoTRIgine (LAMICTAL) 100 MG tablet, Take 1 tablet (100 mg total) by mouth 2 (two) times daily., Disp: 60 tablet, Rfl: 1   metroNIDAZOLE (FLAGYL) 500 MG tablet, Take 1 tablet by mouth 2 times a day for 14 days (Patient not taking: Reported on 05/31/2021), Disp: 28 tablet, Rfl: 0   mirtazapine (REMERON) 7.5 MG tablet, Take 1 tablet (7.5 mg total) by mouth at bedtime., Disp: 30 tablet, Rfl: 1   ondansetron (ZOFRAN ODT) 4 MG disintegrating tablet, Take 1 tablet (4 mg total) by mouth every 8 (eight) hours as needed for nausea or vomiting. (Patient not taking: Reported on 05/31/2021), Disp: 20 tablet, Rfl: 0   venlafaxine XR (EFFEXOR XR) 37.5 MG 24 hr capsule, Take 1 capsule (37.5 mg total) by mouth daily., Disp: 30 capsule, Rfl: 1   Allergies:  Allergies  Allergen Reactions   Ciprofloxacin Hives    No medications prior to admission.    Review of Systems Constitutional: Denies fevers/chills Cardiovascular: Denies chest pain or palpitations Pulmonary: Denies coughing or wheezing Gastrointestinal: Denies nausea, vomiting or diarrhea Genitourinary: With pelvic pain. Denies unusual vaginal bleeding, unusual vaginal discharge, with dysuria, Denies urgency or frequency.  Musculoskeletal: Denies muscle or joint aches and pain.  Neurology: Denies abnormal sensations such as tingling or numbness.    There were no vitals taken for this visit. Physical Exam Blood pressure (!) 95/55, pulse 82, temperature 98.2 F (36.8 C), temperature source Oral, resp. rate 18, height '5\' 1"'$  (1.549 m), weight 47.6 kg, SpO2 100 %.  Constitutional: She is oriented to person, place, and time. She appears well-developed and well-nourished.  HENT:  Head: Normocephalic and atraumatic.  Neck: Normal range of motion.  Cardiovascular: Normal  rate, regular rhythm and normal heart sounds.   Respiratory: Effort normal and breath sounds normal.  GI: Soft. Bowel sounds are normal.  Neurological: She is alert and oriented to person, place, and time.  Skin: Skin is warm and dry.  Psychiatric: She has a normal mood and affect. Her behavior is normal.      CBC Recent Results (from the past 2160 hour(s))  POCT urine pregnancy     Status: Normal   Collection Time: 04/04/21 10:47 AM  Result Value Ref Range   Preg Test, Ur Negative Negative  Urinalysis, Routine w reflex microscopic Urine, Clean Catch     Status: Abnormal   Collection Time: 05/07/21 10:44 AM  Result Value Ref Range   Color, Urine YELLOW YELLOW   APPearance CLEAR CLEAR   Specific Gravity, Urine 1.028 1.005 - 1.030   pH 6.0 5.0 - 8.0   Glucose, UA NEGATIVE NEGATIVE mg/dL   Hgb urine dipstick NEGATIVE NEGATIVE   Bilirubin Urine NEGATIVE NEGATIVE   Ketones, ur 20 (A) NEGATIVE mg/dL   Protein, ur NEGATIVE NEGATIVE mg/dL   Nitrite NEGATIVE NEGATIVE   Leukocytes,Ua NEGATIVE NEGATIVE    Comment: Performed at [facility] [address], [place], [place] [address]  I-Stat beta hCG blood, ED     Status: None   Collection Time: 05/07/21 10:49 AM  Result Value Ref Range   I-stat hCG, quantitative <5.0 <5 mIU/mL   Comment 3            Comment:   GEST. AGE      CONC.  (mIU/mL)   <=1 WEEK        5 - 50     2 WEEKS       50 - 500     3 WEEKS       100 - 10,000     4 WEEKS     1,000 - 30,000        FEMALE AND NON-PREGNANT FEMALE:     LESS THAN 5 mIU/mL   CBC with Differential/Platelet     Status: None   Collection Time: 05/07/21 11:21 AM  Result Value Ref Range   WBC 5.1 4.0 - 10.5 K/uL   RBC 4.52 3.87 - 5.11 MIL/uL   Hemoglobin 13.8 12.0 - 15.0 g/dL   HCT 39.8 36.0 - 46.0 %   MCV 88.1 80.0 - 100.0 fL   MCH 30.5 26.0 - 34.0 pg   MCHC 34.7 30.0 - 36.0 g/dL   RDW 11.6 11.5 - 15.5 %   Platelets 273 150 - 400 K/uL   nRBC 0.0 0.0 - 0.2 %    Neutrophils Relative % 46 %   Neutro Abs 2.4 1.7 - 7.7 K/uL   Lymphocytes Relative 36 %   Lymphs Abs 1.8 0.7 - 4.0 K/uL   Monocytes Relative 8 %   Monocytes Absolute 0.4 0.1 - 1.0 K/uL   Eosinophils Relative 8 %   Eosinophils Absolute 0.4 0.0 - 0.5 K/uL   Basophils Relative 2 %   Basophils Absolute 0.1 0.0 - 0.1 K/uL   Immature Granulocytes 0 %   Abs Immature Granulocytes 0.01 0.00 - 0.07 K/uL    Comment: Performed at [facility], [place] [address], [place], [place] [address]  Comprehensive metabolic panel     Status: None   Collection Time: 05/07/21 11:21 AM  Result Value Ref Range   Sodium 137 135 - 145 mmol/L   Potassium 3.7 3.5 - 5.1 mmol/L   Chloride 106 98 - 111 mmol/L   CO2 24 22 - 32 mmol/L   Glucose, Bld 87 70 - 99 mg/dL    Comment: Glucose reference range applies only to samples taken after fasting for at least 8 hours.   BUN 18 6 - 20 mg/dL   Creatinine, Ser 0.61 0.44 - 1.00 mg/dL   Calcium 8.9 8.9 - 10.3 mg/dL   Total Protein 7.4 6.5 - 8.1 g/dL   Albumin 4.6 3.5 - 5.0 g/dL   AST 15 15 - 41 U/L   ALT 11 0 - 44 U/L   Alkaline Phosphatase 46 38 - 126 U/L   Total Bilirubin 0.7 0.3 - 1.2 mg/dL   GFR, Estimated >60 >60 mL/min    Comment: (NOTE) Calculated using the CKD-EPI Creatinine Equation (2021)    Anion gap 7 5 - 15    Comment: Performed at [facility], [place] [address] [place], [place] [address]  Lipase, blood     Status: None   Collection Time: 05/07/21 11:21 AM  Result Value Ref Range   Lipase 29 11 - 51 U/L    Comment: Performed at [facility], [place] [address], [place],  [address]  GC/Chlamydia probe  amp     Status: None   Collection Time: 05/07/21 11:56 AM  Result Value Ref Range   Neisseria Gonorrhea Negative    Chlamydia Negative    Comment Normal Reference Ranger Chlamydia - Negative    Comment      Normal Reference Range Neisseria Gonorrhea - Negative  Wet prep, genital     Status:  Abnormal   Collection Time: 05/07/21  1:00 PM   Specimen: PATH Cytology Cervicovaginal Ancillary Only  Result Value Ref Range   Yeast Wet Prep HPF POC NONE SEEN NONE SEEN   Trich, Wet Prep NONE SEEN NONE SEEN   Clue Cells Wet Prep HPF POC NONE SEEN NONE SEEN   WBC, Wet Prep HPF POC FEW (A) NONE SEEN   Sperm NONE SEEN     Comment: Performed at [facility], [place] [address] [place],  [address]  CBC with Differential/Platelet     Status: Abnormal   Collection Time: 05/09/21 12:01 PM  Result Value Ref Range   WBC 5.6 3.8 - 10.8 Thousand/uL   RBC 4.57 3.80 - 5.10 Million/uL   Hemoglobin 13.6 11.7 - 15.5 g/dL   HCT 40.4 35.0 - 45.0 %   MCV 88.4 80.0 - 100.0 fL   MCH 29.8 27.0 - 33.0 pg   MCHC 33.7 32.0 - 36.0 g/dL   RDW 11.8 11.0 - 15.0 %   Platelets 303 140 - 400 Thousand/uL   MPV 9.6 7.5 - 12.5 fL   Neutro Abs 2,330 1,500 - 7,800 cells/uL   Lymphs Abs 2,274 850 - 3,900 cells/uL   Absolute Monocytes 398 200 - 950 cells/uL   Eosinophils Absolute 510 (H) 15 - 500 cells/uL   Basophils Absolute 90 0 - 200 cells/uL   Neutrophils Relative % 41.6 %   Total Lymphocyte 40.6 %   Monocytes Relative 7.1 %   Eosinophils Relative 9.1 %   Basophils Relative 1.6 %  Comprehensive metabolic panel     Status: None   Collection Time: 05/09/21 12:01 PM  Result Value Ref Range   Glucose, Bld 87 65 - 99 mg/dL    Comment: .            Fasting reference interval .    BUN 11 7 - 25 mg/dL   Creat 0.63 0.50 - 1.10 mg/dL   BUN/Creatinine Ratio NOT APPLICABLE 6 - 22 (calc)   Sodium 138 135 - 146 mmol/L   Potassium 3.8 3.5 - 5.3 mmol/L   Chloride 104 98 - 110 mmol/L   CO2 23 20 - 32 mmol/L   Calcium 9.3 8.6 - 10.2 mg/dL   Total Protein 7.1 6.1 - 8.1 g/dL   Albumin 4.4 3.6 - 5.1 g/dL   Globulin 2.7 1.9 - 3.7 g/dL (calc)   AG Ratio 1.6 1.0 - 2.5 (calc)   Total Bilirubin 0.3 0.2 - 1.2 mg/dL   Alkaline phosphatase (APISO) 46 31 - 125 U/L   AST 12 10 - 30 U/L   ALT 8 6 - 29  U/L  Lipid panel     Status: Abnormal   Collection Time: 05/09/21 12:01 PM  Result Value Ref Range   Cholesterol 126 <200 mg/dL   HDL 35 (L) > OR = 50 mg/dL   Triglycerides 54 <150 mg/dL   LDL Cholesterol (Calc) 78 mg/dL (calc)    Comment: Reference range: <100 . Desirable range <100 mg/dL for primary prevention;   <70 mg/dL for patients with CHD or diabetic patients  with >  or = 2 CHD risk factors. Marland Kitchen LDL-C is now calculated using the Martin-Hopkins  calculation, which is a validated novel method providing  better accuracy than the Friedewald equation in the  estimation of LDL-C.  Cresenciano Genre et al. Annamaria Helling. [id number]): 2061-2068  (http://education.[facility].com/faq/FAQ164)    Total CHOL/HDL Ratio 3.6 <5.0 (calc)   Non-HDL Cholesterol (Calc) 91 <130 mg/dL (calc)    Comment: For patients with diabetes plus 1 major ASCVD risk  factor, treating to a non-HDL-C goal of <100 mg/dL  (LDL-C of <70 mg/dL) is considered a therapeutic  option.   hCG, serum, qualitative     Status: None   Collection Time: 05/09/21 12:01 PM  Result Value Ref Range   Preg, Serum NEGATIVE     Comment: Reference Range Non-Pregnant: Negative Pregnant:     Positive .     Narrative & Impression  CLINICAL DATA:  Pelvic pain. Pelvic inflammatory disease potentially related to IUD removed 1 week prior. Dysuria.   EXAM: CT ABDOMEN AND PELVIS WITH CONTRAST   TECHNIQUE: Multidetector CT imaging of the abdomen and pelvis was performed using the standard protocol following bolus administration of intravenous contrast.   CONTRAST:  29m OMNIPAQUE IOHEXOL 350 MG/ML SOLN   COMPARISON:  None.   FINDINGS: Lower chest: Lung bases clear.   Hepatobiliary: . liver normal.  Normal gallbladder.   Pancreas: Pancreas is normal. No ductal dilatation. No pancreatic inflammation.   Spleen: Normal spleen   Adrenals/urinary tract: Adrenal glands are normal. The kidneys enhance symmetrically. No hydronephrosis  or hydroureter. Bladder normal   Stomach/Bowel: Stomach, small bowel, appendix, and cecum are normal. The colon and rectosigmoid colon are normal.   Vascular/Lymphatic: Abdominal aorta is normal caliber. No periportal or retroperitoneal adenopathy. No pelvic adenopathy.   Reproductive: heterogeneous enhancement of the uterine wall which nonspecific. Ovaries are normal. No free fluid.   Other: No free fluid.   Musculoskeletal: No aggressive osseous lesion.   IMPRESSION: 1. Heterogeneous enhancement of the uterus is nonspecific. 2. Ovaries appear normal. 3. Normal appendix. 4. No obstructive uropathy.     Electronically Signed   By: SSuzy BouchardM.D.   On: 05/07/2021 17:39    Narrative & Impression  CLINICAL DATA:  Severe pelvic pain.   EXAM: TRANSABDOMINAL AND TRANSVAGINAL ULTRASOUND OF PELVIS   DOPPLER ULTRASOUND OF OVARIES   TECHNIQUE: Both transabdominal and transvaginal ultrasound examinations of the pelvis were performed. Transabdominal technique was performed for global imaging of the pelvis including uterus, ovaries, adnexal regions, and pelvic cul-de-sac.   It was necessary to proceed with endovaginal exam following the transabdominal exam to visualize the endometrium and ovaries. Color and duplex Doppler ultrasound was utilized to evaluate blood flow to the ovaries.   COMPARISON:  July 16, 2017.   FINDINGS: Uterus   Measurements: 8.6 x 4.5 x 4.2 cm = volume: 84 mL. 2.5 cm fibroid is noted anteriorly.   Endometrium   Thickness: 5 mm which is within normal limits. No focal abnormality visualized.   Right ovary   Measurements: 3.1 x 3.0 x 2.1 cm = volume: 12 mL. Normal appearance/no adnexal mass.   Left ovary   Measurements: 4.0 x 3.1 x 2.6 cm = volume: 17 mL. 2.1 cm follicle is noted.   Pulsed Doppler evaluation of both ovaries demonstrates normal low-resistance arterial and venous waveforms.   Other findings   Trace free fluid  is noted which most likely is physiologic.   IMPRESSION: No evidence of ovarian mass or torsion.   2.5  cm uterine fibroid is noted. (Intramural location as per images)   No other significant abnormality seen in the pelvis.     Electronically Signed   By: Marijo Conception M.D.   On: 05/07/2021 15:52   06/07/2021: Urine pregnancy test: Negative.   Assessment/Plan: 30 y/o P1 with a history of chronic pelvic pain here for diagnostic laparoscopy, possible operative: possible ovarian cystectomy, possible endometriosis lesions excision, possible endometriosis lesions fulguration.  -This procedure has been fully reviewed with the patient and written informed consent has been obtained.  -  Admit to pre-op  - NPO and LR -Proceed with surgery.  Archie Endo, MD.  06/06/2021, 5:59 PM

## 2021-06-07 ENCOUNTER — Encounter (HOSPITAL_COMMUNITY): Payer: Self-pay | Admitting: Obstetrics & Gynecology

## 2021-06-07 ENCOUNTER — Ambulatory Visit (HOSPITAL_COMMUNITY): Payer: 59 | Admitting: Anesthesiology

## 2021-06-07 ENCOUNTER — Other Ambulatory Visit: Payer: Self-pay

## 2021-06-07 ENCOUNTER — Ambulatory Visit (HOSPITAL_COMMUNITY)
Admission: RE | Admit: 2021-06-07 | Discharge: 2021-06-07 | Disposition: A | Discharge status: Home / Self Care | Payer: 59 | Source: Ambulatory Visit | Attending: Obstetrics & Gynecology | Admitting: Obstetrics & Gynecology

## 2021-06-07 ENCOUNTER — Encounter (HOSPITAL_COMMUNITY)
Admission: RE | Disposition: A | Discharge status: Home / Self Care | Payer: Self-pay | Source: Ambulatory Visit | Attending: Obstetrics & Gynecology

## 2021-06-07 DIAGNOSIS — Z79899 Other long term (current) drug therapy: Secondary | ICD-10-CM | POA: Diagnosis not present

## 2021-06-07 DIAGNOSIS — N803 Endometriosis of pelvic peritoneum: Secondary | ICD-10-CM | POA: Diagnosis not present

## 2021-06-07 DIAGNOSIS — Z87891 Personal history of nicotine dependence: Secondary | ICD-10-CM | POA: Diagnosis not present

## 2021-06-07 DIAGNOSIS — Z881 Allergy status to other antibiotic agents status: Secondary | ICD-10-CM | POA: Diagnosis not present

## 2021-06-07 DIAGNOSIS — D251 Intramural leiomyoma of uterus: Secondary | ICD-10-CM | POA: Insufficient documentation

## 2021-06-07 DIAGNOSIS — Z793 Long term (current) use of hormonal contraceptives: Secondary | ICD-10-CM | POA: Diagnosis not present

## 2021-06-07 DIAGNOSIS — R102 Pelvic and perineal pain: Secondary | ICD-10-CM | POA: Diagnosis present

## 2021-06-07 DIAGNOSIS — N736 Female pelvic peritoneal adhesions (postinfective): Secondary | ICD-10-CM | POA: Insufficient documentation

## 2021-06-07 HISTORY — PX: LAPAROSCOPY: SHX197

## 2021-06-07 LAB — BASIC METABOLIC PANEL
Anion gap: 9 (ref 5–15)
BUN: 13 mg/dL (ref 6–20)
CO2: 21 mmol/L — ABNORMAL LOW (ref 22–32)
Calcium: 8.9 mg/dL (ref 8.9–10.3)
Chloride: 106 mmol/L (ref 98–111)
Creatinine, Ser: 0.64 mg/dL (ref 0.44–1.00)
GFR, Estimated: >60 mL/min (ref >60)
Glucose, Bld: 82 mg/dL (ref 70–99)
Potassium: 4 mmol/L (ref 3.5–5.1)
Sodium: 136 mmol/L (ref 135–145)

## 2021-06-07 LAB — CBC
HCT: 40.5 % (ref 36.0–46.0)
Hemoglobin: 13.8 g/dL (ref 12.0–15.0)
MCH: 30.5 pg (ref 26.0–34.0)
MCHC: 34.1 g/dL (ref 30.0–36.0)
MCV: 89.6 fL (ref 80.0–100.0)
Platelets: 259 10*3/uL (ref 150–400)
RBC: 4.52 MIL/uL (ref 3.87–5.11)
RDW: 11.2 % — ABNORMAL LOW (ref 11.5–15.5)
WBC: 6.5 10*3/uL (ref 4.0–10.5)
nRBC: 0 % (ref 0.0–0.2)

## 2021-06-07 LAB — POCT PREGNANCY, URINE: Preg Test, Ur: NEGATIVE

## 2021-06-07 SURGERY — LAPAROSCOPY OPERATIVE
Anesthesia: General

## 2021-06-07 MED ORDER — ONDANSETRON HCL 4 MG/2ML IJ SOLN
INTRAMUSCULAR | Status: AC
  Filled 2021-06-07: qty 2

## 2021-06-07 MED ORDER — METHOCARBAMOL 500 MG PO TABS
ORAL_TABLET | ORAL | Status: AC
  Filled 2021-06-07: qty 1

## 2021-06-07 MED ORDER — OXYCODONE HCL 5 MG PO TABS
ORAL_TABLET | ORAL | Status: AC
  Filled 2021-06-07: qty 1

## 2021-06-07 MED ORDER — ACETAMINOPHEN 500 MG PO TABS
1000.0000 mg | ORAL_TABLET | Freq: Once | ORAL | Status: AC
  Administered 2021-06-07: 1000 mg via ORAL
  Filled 2021-06-07: qty 2

## 2021-06-07 MED ORDER — SODIUM CHLORIDE 0.9 % IR SOLN
Status: DC | PRN
  Administered 2021-06-07: 1000 mL

## 2021-06-07 MED ORDER — DEXAMETHASONE SODIUM PHOSPHATE 10 MG/ML IJ SOLN
INTRAMUSCULAR | Status: DC | PRN
  Administered 2021-06-07: 5 mg via INTRAVENOUS

## 2021-06-07 MED ORDER — ONDANSETRON HCL 4 MG/2ML IJ SOLN
INTRAMUSCULAR | Status: DC | PRN
  Administered 2021-06-07: 4 mg via INTRAVENOUS

## 2021-06-07 MED ORDER — ROCURONIUM BROMIDE 10 MG/ML (PF) SYRINGE
PREFILLED_SYRINGE | INTRAVENOUS | Status: DC | PRN
  Administered 2021-06-07: 20 mg via INTRAVENOUS
  Administered 2021-06-07: 50 mg via INTRAVENOUS

## 2021-06-07 MED ORDER — KETAMINE HCL 10 MG/ML IJ SOLN
INTRAMUSCULAR | Status: DC | PRN
  Administered 2021-06-07: 20 mg via INTRAVENOUS

## 2021-06-07 MED ORDER — DEXMEDETOMIDINE HCL IN NACL 200 MCG/50ML IV SOLN
INTRAVENOUS | Status: DC | PRN
  Administered 2021-06-07: 8 ug via INTRAVENOUS
  Administered 2021-06-07 (×2): 4 ug via INTRAVENOUS

## 2021-06-07 MED ORDER — FENTANYL CITRATE (PF) 250 MCG/5ML IJ SOLN
INTRAMUSCULAR | Status: AC
  Filled 2021-06-07: qty 5

## 2021-06-07 MED ORDER — PHENYLEPHRINE 40 MCG/ML (10ML) SYRINGE FOR IV PUSH (FOR BLOOD PRESSURE SUPPORT)
PREFILLED_SYRINGE | INTRAVENOUS | Status: DC | PRN
  Administered 2021-06-07: 80 ug via INTRAVENOUS

## 2021-06-07 MED ORDER — METHOCARBAMOL 500 MG PO TABS
500.0000 mg | ORAL_TABLET | Freq: Once | ORAL | Status: AC
  Administered 2021-06-07: 500 mg via ORAL

## 2021-06-07 MED ORDER — 0.9 % SODIUM CHLORIDE (POUR BTL) OPTIME
TOPICAL | Status: DC | PRN
  Administered 2021-06-07: 1000 mL

## 2021-06-07 MED ORDER — TRAMADOL HCL 50 MG PO TABS: 50.0000 mg | ORAL_TABLET | ORAL | 0 refills | Status: DC | PRN

## 2021-06-07 MED ORDER — PROPOFOL 10 MG/ML IV BOLUS
INTRAVENOUS | Status: DC | PRN
  Administered 2021-06-07: 20 mg via INTRAVENOUS
  Administered 2021-06-07: 150 mg via INTRAVENOUS

## 2021-06-07 MED ORDER — LIDOCAINE-EPINEPHRINE 1 %-1:100000 IJ SOLN
INTRAMUSCULAR | Status: AC
  Filled 2021-06-07: qty 1

## 2021-06-07 MED ORDER — KETOROLAC TROMETHAMINE 30 MG/ML IJ SOLN
INTRAMUSCULAR | Status: DC | PRN
  Administered 2021-06-07: 30 mg via INTRAVENOUS

## 2021-06-07 MED ORDER — ROCURONIUM BROMIDE 10 MG/ML (PF) SYRINGE
PREFILLED_SYRINGE | INTRAVENOUS | Status: AC
  Filled 2021-06-07: qty 10

## 2021-06-07 MED ORDER — IBUPROFEN 800 MG PO TABS: 800.0000 mg | ORAL_TABLET | Freq: Three times a day (TID) | ORAL | 0 refills | Status: DC | PRN

## 2021-06-07 MED ORDER — MEPERIDINE HCL 25 MG/ML IJ SOLN: 6.2500 mg | INTRAMUSCULAR | Status: DC | PRN

## 2021-06-07 MED ORDER — LIDOCAINE 2% (20 MG/ML) 5 ML SYRINGE
INTRAMUSCULAR | Status: DC | PRN
  Administered 2021-06-07: 50 mg via INTRAVENOUS

## 2021-06-07 MED ORDER — HYDROMORPHONE HCL 1 MG/ML IJ SOLN
0.2500 mg | INTRAMUSCULAR | Status: DC | PRN
  Administered 2021-06-07 (×2): 0.5 mg via INTRAVENOUS

## 2021-06-07 MED ORDER — POVIDONE-IODINE 10 % EX SWAB
2.0000 "application " | Freq: Once | CUTANEOUS | Status: AC
  Administered 2021-06-07: 2 via TOPICAL

## 2021-06-07 MED ORDER — MIDAZOLAM HCL 2 MG/2ML IJ SOLN
INTRAMUSCULAR | Status: AC
  Filled 2021-06-07: qty 2

## 2021-06-07 MED ORDER — FENTANYL CITRATE (PF) 250 MCG/5ML IJ SOLN
INTRAMUSCULAR | Status: DC | PRN
  Administered 2021-06-07 (×2): 50 ug via INTRAVENOUS

## 2021-06-07 MED ORDER — HYDROMORPHONE HCL 1 MG/ML IJ SOLN
INTRAMUSCULAR | Status: AC
  Filled 2021-06-07: qty 1

## 2021-06-07 MED ORDER — EPHEDRINE SULFATE-NACL 50-0.9 MG/10ML-% IV SOSY
PREFILLED_SYRINGE | INTRAVENOUS | Status: DC | PRN
Start: 2021-06-07 — End: 2021-06-07
  Administered 2021-06-07 (×2): 5 mg via INTRAVENOUS

## 2021-06-07 MED ORDER — KETAMINE HCL 50 MG/5ML IJ SOSY
PREFILLED_SYRINGE | INTRAMUSCULAR | Status: AC
  Filled 2021-06-07: qty 5

## 2021-06-07 MED ORDER — KETOROLAC TROMETHAMINE 30 MG/ML IJ SOLN: 30.0000 mg | Freq: Once | INTRAMUSCULAR | Status: DC | PRN

## 2021-06-07 MED ORDER — OXYCODONE HCL 5 MG PO TABS: 5.0000 mg | ORAL_TABLET | ORAL | Status: DC | PRN

## 2021-06-07 MED ORDER — TRAMADOL HCL 50 MG PO TABS: 50.0000 mg | ORAL_TABLET | ORAL | Status: DC | PRN

## 2021-06-07 MED ORDER — ACETAMINOPHEN 325 MG PO TABS: 650.0000 mg | ORAL_TABLET | Freq: Four times a day (QID) | ORAL | Status: DC | PRN

## 2021-06-07 MED ORDER — DEXAMETHASONE SODIUM PHOSPHATE 10 MG/ML IJ SOLN
INTRAMUSCULAR | Status: AC
  Filled 2021-06-07: qty 1

## 2021-06-07 MED ORDER — LIDOCAINE-EPINEPHRINE 1 %-1:100000 IJ SOLN
INTRAMUSCULAR | Status: DC | PRN
  Administered 2021-06-07: 10 mL
  Administered 2021-06-07: 6 mL

## 2021-06-07 MED ORDER — OXYCODONE HCL 5 MG PO TABS: 5.0000 mg | ORAL_TABLET | ORAL | 0 refills | Status: DC | PRN

## 2021-06-07 MED ORDER — OXYCODONE HCL 5 MG/5ML PO SOLN
5.0000 mg | Freq: Once | ORAL | Status: AC | PRN
Start: 2021-06-07 — End: 2021-06-07

## 2021-06-07 MED ORDER — ORAL CARE MOUTH RINSE: 15.0000 mL | Freq: Once | OROMUCOSAL | Status: AC

## 2021-06-07 MED ORDER — OXYCODONE HCL 5 MG PO TABS
5.0000 mg | ORAL_TABLET | Freq: Once | ORAL | Status: AC | PRN
  Administered 2021-06-07: 5 mg via ORAL

## 2021-06-07 MED ORDER — LACTATED RINGERS IV SOLN: INTRAVENOUS | Status: DC

## 2021-06-07 MED ORDER — PROPOFOL 10 MG/ML IV BOLUS
INTRAVENOUS | Status: AC
  Filled 2021-06-07: qty 40

## 2021-06-07 MED ORDER — SCOPOLAMINE 1 MG/3DAYS TD PT72
1.0000 | MEDICATED_PATCH | TRANSDERMAL | Status: DC
  Administered 2021-06-07: 1.5 mg via TRANSDERMAL
  Filled 2021-06-07: qty 1

## 2021-06-07 MED ORDER — APREPITANT 40 MG PO CAPS
40.0000 mg | ORAL_CAPSULE | Freq: Once | ORAL | Status: AC
  Administered 2021-06-07: 40 mg via ORAL
  Filled 2021-06-07: qty 1

## 2021-06-07 MED ORDER — MIDAZOLAM HCL 2 MG/2ML IJ SOLN
INTRAMUSCULAR | Status: DC | PRN
  Administered 2021-06-07: 2 mg via INTRAVENOUS

## 2021-06-07 MED ORDER — SUGAMMADEX SODIUM 200 MG/2ML IV SOLN
INTRAVENOUS | Status: DC | PRN
  Administered 2021-06-07: 200 mg via INTRAVENOUS

## 2021-06-07 MED ORDER — KETOROLAC TROMETHAMINE 30 MG/ML IJ SOLN
INTRAMUSCULAR | Status: AC
  Filled 2021-06-07: qty 1

## 2021-06-07 MED ORDER — PROMETHAZINE HCL 25 MG/ML IJ SOLN: 6.2500 mg | INTRAMUSCULAR | Status: DC | PRN

## 2021-06-07 MED ORDER — CHLORHEXIDINE GLUCONATE 0.12 % MT SOLN
15.0000 mL | Freq: Once | OROMUCOSAL | Status: AC
  Administered 2021-06-07: 15 mL via OROMUCOSAL
  Filled 2021-06-07: qty 15

## 2021-06-07 MED ORDER — ACETAMINOPHEN 325 MG PO TABS: 650.0000 mg | ORAL_TABLET | Freq: Four times a day (QID) | ORAL | 1 refills | Status: AC | PRN

## 2021-06-07 MED ORDER — IBUPROFEN 800 MG PO TABS: 800.0000 mg | ORAL_TABLET | Freq: Three times a day (TID) | ORAL | Status: DC | PRN

## 2021-06-07 SURGICAL SUPPLY — 29 items
CABLE HIGH FREQUENCY MONO STRZ (ELECTRODE) ×2 IMPLANT
CANISTER SUCTION 2500CC (MISCELLANEOUS) ×2 IMPLANT
DECANTER SPIKE VIAL GLASS SM (MISCELLANEOUS) ×2 IMPLANT
DERMABOND ADVANCED (GAUZE/BANDAGES/DRESSINGS) ×1
DERMABOND ADVANCED .7 DNX12 (GAUZE/BANDAGES/DRESSINGS) ×1 IMPLANT
DRSG OPSITE POSTOP 3X4 (GAUZE/BANDAGES/DRESSINGS) ×2 IMPLANT
DRSG TEGADERM 2-3/8X2-3/4 SM (GAUZE/BANDAGES/DRESSINGS) ×2 IMPLANT
DURAPREP 26ML APPLICATOR (WOUND CARE) ×2 IMPLANT
GLOVE SURG LTX SZ6.5 (GLOVE) ×2 IMPLANT
GLOVE SURG UNDER POLY LF SZ7 (GLOVE) ×4 IMPLANT
GOWN STRL REUS W/ TWL LRG LVL3 (GOWN DISPOSABLE) ×3 IMPLANT
GOWN STRL REUS W/TWL LRG LVL3 (GOWN DISPOSABLE) ×3
IRRIG SUCT STRYKERFLOW 2 WTIP (MISCELLANEOUS) ×2
IRRIGATION SUCT STRKRFLW 2 WTP (MISCELLANEOUS) ×1 IMPLANT
KIT TURNOVER KIT B (KITS) ×2 IMPLANT
NEEDLE INSUFFLATION 14GA 120MM (NEEDLE) ×2 IMPLANT
NS IRRIG 1000ML POUR BTL (IV SOLUTION) ×2 IMPLANT
PACK LAPAROSCOPY BASIN (CUSTOM PROCEDURE TRAY) ×2 IMPLANT
PACK TRENDGUARD 450 HYBRID PRO (MISCELLANEOUS) ×1 IMPLANT
PROTECTOR NERVE ULNAR (MISCELLANEOUS) ×4 IMPLANT
SET TUBE SMOKE EVAC HIGH FLOW (TUBING) ×2 IMPLANT
SLEEVE ENDOPATH XCEL 5M (ENDOMECHANICALS) ×2 IMPLANT
SOLUTION ELECTROLUBE (MISCELLANEOUS) ×2 IMPLANT
SUT MNCRL AB 4-0 PS2 18 (SUTURE) ×2 IMPLANT
TOWEL GREEN STERILE FF (TOWEL DISPOSABLE) ×4 IMPLANT
TRAY FOLEY W/BAG SLVR 14FR (SET/KITS/TRAYS/PACK) ×2 IMPLANT
TRENDGUARD 450 HYBRID PRO PACK (MISCELLANEOUS) ×2
TROCAR XCEL NON-BLD 5MMX100MML (ENDOMECHANICALS) ×4 IMPLANT
WARMER LAPAROSCOPE (MISCELLANEOUS) ×2 IMPLANT

## 2021-06-07 NOTE — Transfer of Care (Signed)
Immediate Anesthesia Transfer of Care Note  Patient: Camrynn Macbride  Procedure(s) Performed: LAPAROSCOPY OPERATIVE with Flonnie Hailstone OF LESIONS and lysis of adhesions  Patient Location: PACU  Anesthesia Type:General  Level of Consciousness: drowsy, patient cooperative and responds to stimulation  Airway & Oxygen Therapy: Patient Spontanous Breathing  Post-op Assessment: Report given to RN, Post -op Vital signs reviewed and stable and Patient moving all extremities X 4  Post vital signs: Reviewed and stable  Last Vitals:  Vitals Value Taken Time  BP 112/52 06/07/21 1721  Temp    Pulse 106 06/07/21 1723  Resp 15 06/07/21 1723  SpO2 100 % 06/07/21 1723  Vitals shown include unvalidated device data.  Last Pain:  Vitals:   06/07/21 1227  TempSrc: Oral         Complications: No notable events documented.

## 2021-06-07 NOTE — Anesthesia Preprocedure Evaluation (Addendum)
Anesthesia Evaluation  Patient identified by MRN, date of birth, ID band Patient awake    Reviewed: Allergy & Precautions, NPO status , Patient's Chart, lab work & pertinent test results  Airway Mallampati: III  TM Distance: >3 FB Neck ROM: Full  Mouth opening: Limited Mouth Opening Comment: Small mouth opening Dental no notable dental hx. (+) Teeth Intact, Dental Advisory Given   Pulmonary former smoker,  Quit smoking 2018   Pulmonary exam normal breath sounds clear to auscultation       Cardiovascular negative cardio ROS Normal cardiovascular exam Rhythm:Regular Rate:Normal     Neuro/Psych PSYCHIATRIC DISORDERS Anxiety Depression Bipolar Disorder negative neurological ROS     GI/Hepatic negative GI ROS, Neg liver ROS,   Endo/Other  negative endocrine ROS  Renal/GU negative Renal ROS  Female GU complaint (pelvic pain)     Musculoskeletal negative musculoskeletal ROS (+)   Abdominal   Peds  Hematology negative hematology ROS (+)   Anesthesia Other Findings Pain meds: robaxin, ibuprofen, tramadol (doesn't use often)  Reproductive/Obstetrics negative OB ROS                            Anesthesia Physical Anesthesia Plan  ASA: 1  Anesthesia Plan: General   Post-op Pain Management:    Induction: Intravenous  PONV Risk Score and Plan: 4 or greater and Ondansetron, Aprepitant, Dexamethasone, Scopolamine patch - Pre-op, Midazolam and Treatment may vary due to age or medical condition  Airway Management Planned: Oral ETT  Additional Equipment: None  Intra-op Plan:   Post-operative Plan: Extubation in OR  Informed Consent: I have reviewed the patients History and Physical, chart, labs and discussed the procedure including the risks, benefits and alternatives for the proposed anesthesia with the patient or authorized representative who has indicated his/her understanding and acceptance.      Dental advisory given  Plan Discussed with: CRNA  Anesthesia Plan Comments:        Anesthesia Quick Evaluation

## 2021-06-07 NOTE — Op Note (Addendum)
Karen [name] DOB: 12-10-1990 MRN: [id number]    PREOP DIAGNOSIS:  1. Chronic pelvic pain refractive to medical management.  2. Endometriosis.  3. Uterus with 2.5 cm intramural fibroid.  4. Left ovary with 2cm follicular cyst.    POSTOP DIAGNOSIS:  1. Chronic pelvic pain refractory to medical management.  2. Small endometriosis lesions on posterior cul de sac as well descending bowel.  3. 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  4. Uterus with 2.5 cm intramural fibroid.  5. Normal left and right ovaries and fallopian tubes bilaterally. 6. Adhesions between left pelvic side wall and bowel.   PROCEDURES:  Operative laparoscopy with endometriosis lesions resection and lysis of adhesions.     SURGEON: Dr. Waymon Amato   ASSISTANT: Dr. Everett Graff  ATTENDING ATTESTATION: I was present and scrubbed and performed the procedure and the assistant was required due to the complexity of anatomy.    ANESTHESIA: General ETA   COMPLICATIONS: None  IV fluids: 1200 cc LR  Urine output: 75 cc clear urine   EBL: minimal     FINDINGS: Normal appearing uterus (intramural fibroid not visualized).  Normal left and right ovaries.  Normal left and right fallopian tubes.  Superficial hyperpigmented lesions on left posterior cul de sac and epiploic fat of descending colon. Filmy adhesions between the left pelvic side wall and bowel.  Mild peritoneal defects on the left pelvic side wall. Normal anterior cul de sac.  Normal ureters bilaterally. Normal appendix. Normal uterosacral ligaments bilaterally.   PROCEDURE:    Informed consent was obtained from the patient to undergo the procedure after discussing the risks benefits and alternatives of the procedure. She was taken to the operating room where anesthesia was administered without difficulty. Both arms were tucked and she was placed in the dorsal lithotomy position. She was prepped abdominally, vaginally and perineum in the usual sterile  fashion. Foley catheter was placed in the bladder and Hulka uterine manipulator was placed in the cervix.   Attention was then turned to the abdomen where lidocaine with epinephrine was instilled in the infraumbilical area. The skin was incised with a scalpel and Veress needle placed in after tenting up the abdominal wall.  Correct placement of the needle was confirmed by instilling 3cc of normal saline into the abdomen, which was pulled back with only air bubbles returned. Hanging drop test of normal saline was also confirmatory with saline absorbed on lifting up the abdomen.  The 11 mm excel trocar was then placed in under direct visualization.  The scope was then placed in and the pelvis was visualized. Two 5 mm ports were placed on the left and right sides of the lower abdomen using the 5 mm Excel trocar under direct visualization. The above findings were noted.  Lysis of adhesions was done with the endoshears without energy.  The hyperpigmented lesions in the cul de sac and on the epiploic fat of the bowel were excised with the laparoscopic punch biopsy after tenting up the area of the lesions.  The pelvis was irrigated and suctioned out.  Hemostasis was assured.  All instruments were then removed under direct visualization.  Gas was allowed to escape.  The umbilical incision was then closed using 0 Vicryl  Suture and the skin was closed with 4-0 Monocryl then dermabond applied. Dermabond was applied on the two side ports.  Tegaderm was placed over the umbilical incision.  The Foley catheter, Hulka manipulator and ring forceps with a gauze pad were  removed.  The patient was then awoken from anesthesia and she was taken to recovery room in stable condition   SPECIMENS: Hyperpigmented lesions likely endometriosis.   Kaylanie Capili Alesia Richards, MD.  DATE 06/07/2021 Time: 06:56 PM.

## 2021-06-07 NOTE — Discharge Instructions (Signed)
Karen [name],  You may remove the belly button incision dressings in 3 days (Sunday) but it may fall out by itself sooner than that and that's okay.   You may shower as usual, keeping water exposure over the abdomen to a minimal and pat the incisions dry after showering.   To remove the dressings in 3 days allow water to soak it well during showering and then gently peel them away the dressing starting at the corners.  After showering always pat the incisions dry.     You may see a sticky substance over the incisions, this is glue used for the incisions and will fall off by itself.   6.  No tampons or douching or vaginal intercourse after the procedure until at least two weeks from the surgery and cleared.    7. Expect some light vaginal bleeding for the next 2 to 7 days but do let me know if any heavy bleeding.  8. You may have some back pain because of the gas used during the procedure, this should improve with time as you ambulate regularly.   9.  Call me if with excessive pain not improving with medication use, excessive vaginal bleeding or fevers or chills.    Call me too if with any other questions or concerns, Dr. Alesia Richards Phone: [phone] extension [phone] if regular business hours.  If After regular hours: call  office number and press the extension to speak to provider on call.

## 2021-06-07 NOTE — Anesthesia Procedure Notes (Signed)

## 2021-06-07 NOTE — Interval H&P Note (Signed)
History and Physical Interval Note:  06/07/2021 2:35 PM  Karen [name]  has presented today for surgery, with the diagnosis of CHRONIC PELVIC PAIN.  The various methods of treatment have been discussed with the patient and family. After consideration of risks, benefits and other options for treatment, the patient has consented to Procedure(s): DIAGNOSTIC LAPAROSCOPY, POSSIBLE OPERATIVE LAPAROSCOPY with FULGURATION/EXCISION OF ENDOMETRIOSIS OR OVARIAN LESIONS as a surgical intervention.  The patient's history has been reviewed, patient examined, no change in status, stable for surgery.  I have reviewed the patient's chart and labs.  Questions were answered to the patient's satisfaction.   Archie Endo, MD.

## 2021-06-08 ENCOUNTER — Encounter (HOSPITAL_COMMUNITY): Payer: Self-pay | Admitting: Obstetrics & Gynecology

## 2021-06-08 NOTE — Anesthesia Postprocedure Evaluation (Signed)
Anesthesia Post Note  Patient: Karen [name]  Procedure(s) Performed: LAPAROSCOPY OPERATIVE with /EXCISION OF LESIONS and lysis of adhesions     Patient location during evaluation: PACU Anesthesia Type: General Level of consciousness: awake and alert Pain management: pain level controlled Vital Signs Assessment: post-procedure vital signs reviewed and stable Respiratory status: spontaneous breathing, nonlabored ventilation, respiratory function stable and patient connected to nasal cannula oxygen Cardiovascular status: blood pressure returned to baseline and stable Postop Assessment: no apparent nausea or vomiting Anesthetic complications: no   No notable events documented.  Last Vitals:  Vitals:   06/07/21 1809 06/07/21 1824  BP: (!) 97/58 (!) 96/59  Pulse: 88 79  Resp: 15 16  Temp:  36.5 C  SpO2: 100% 100%    Last Pain:  Vitals:   06/07/21 1824  TempSrc:   PainSc: 3                  Tyona Nilsen P Dontai Pember

## 2021-06-09 LAB — SURGICAL PATHOLOGY

## 2021-06-13 ENCOUNTER — Ambulatory Visit (INDEPENDENT_AMBULATORY_CARE_PROVIDER_SITE_OTHER): Payer: 59 | Admitting: Physician Assistant

## 2021-06-13 ENCOUNTER — Encounter: Payer: Self-pay | Admitting: Physician Assistant

## 2021-06-13 ENCOUNTER — Other Ambulatory Visit: Payer: Self-pay

## 2021-06-13 VITALS — Wt 105.0 lb

## 2021-06-13 DIAGNOSIS — Z5181 Encounter for therapeutic drug level monitoring: Secondary | ICD-10-CM

## 2021-06-13 DIAGNOSIS — L700 Acne vulgaris: Secondary | ICD-10-CM | POA: Diagnosis not present

## 2021-06-13 NOTE — Progress Notes (Signed)
   Follow-Up Visit   Subjective  Karen [name] is a 30 y.o. female who presents for the following: Acne (Isotretinoin follow up. She is not taking isotretinoin currently because she had to have a surgical procedure for endometriosis.  She also had her IUD removed and has had a depo-provera injection. She is not currently sexually active. She took 4 pills before she discontinued the medication. She had no problems and noted that her lips were dry and her redness improved. She had no noticeable side effects otherwise.  The following portions of the chart were reviewed this encounter and updated as appropriate:  Tobacco  Allergies  Meds  Problems  Med Hx  Surg Hx  Fam Hx  Soc  Hx     Objective  Well appearing patient in no apparent distress; mood and affect are within normal limits.  A focused examination was performed including face. Relevant physical exam findings are noted in the Assessment and Plan.  central face Erythematous papules and pustules with comedones. Severe post inflammatory hyperpigmentation.   Assessment & Plan  Acne vulgaris central face  Pregnancy, urine - central face  Related Medications ISOtretinoin (ACCUTANE) 30 MG capsule Take 1 capsule (30 mg total) by mouth daily.  Encounter for therapeutic drug monitoring  Related Procedures Pregnancy, urine  We will have to change her 2 forms of birth control and restart medication in a month. We will monitor her closely with her significant medical history.  I, Sabrie Moritz, PA-C, have reviewed all documentation's for this visit.  The documentation on 06/13/21 for the exam, diagnosis, procedures and orders are all accurate and complete.

## 2021-06-14 LAB — PREGNANCY, URINE: Preg Test, Ur: NEGATIVE

## 2021-06-14 MED ORDER — ISOTRETINOIN 30 MG PO CAPS: 30.0000 mg | ORAL_CAPSULE | Freq: Every day | ORAL | 0 refills | Status: DC

## 2021-06-14 NOTE — Addendum Note (Signed)
Addended by: Zenia Resides on: 06/14/2021 07:22 AM   Modules accepted: Orders

## 2021-06-29 ENCOUNTER — Telehealth (HOSPITAL_COMMUNITY): Payer: Self-pay | Admitting: Physician Assistant

## 2021-06-29 NOTE — Telephone Encounter (Signed)
Patient called to report feeling like her meds are not working since provider changed medications. Please call pt to advise. Confirmed phone [phone]

## 2021-07-05 NOTE — Telephone Encounter (Signed)
Message acknowledged and reviewed by provider. Provider to contact patient to discuss current medication regimen.

## 2021-07-17 ENCOUNTER — Ambulatory Visit (INDEPENDENT_AMBULATORY_CARE_PROVIDER_SITE_OTHER): Payer: 59 | Admitting: Dermatology

## 2021-07-17 ENCOUNTER — Other Ambulatory Visit: Payer: Self-pay

## 2021-07-17 VITALS — Wt 105.0 lb

## 2021-07-17 DIAGNOSIS — Z5181 Encounter for therapeutic drug level monitoring: Secondary | ICD-10-CM | POA: Diagnosis not present

## 2021-07-17 DIAGNOSIS — L700 Acne vulgaris: Secondary | ICD-10-CM | POA: Diagnosis not present

## 2021-07-17 MED ORDER — ISOTRETINOIN 30 MG PO CAPS
30.0000 mg | ORAL_CAPSULE | Freq: Every day | ORAL | 0 refills | Status: DC
Start: 2021-07-17 — End: 2021-08-17

## 2021-07-18 ENCOUNTER — Telehealth: Payer: Self-pay | Admitting: *Deleted

## 2021-07-18 ENCOUNTER — Telehealth: Payer: Self-pay | Admitting: Dermatology

## 2021-07-18 LAB — PREGNANCY, URINE: Preg Test, Ur: NEGATIVE

## 2021-07-18 NOTE — Telephone Encounter (Signed)
Patient left message on office voice mail that she was having trouble with ipledge.  Patient stated that after she answered the forms of contraception(s) that a message popped up that she needed to contact her prescribing doctor's office.

## 2021-07-18 NOTE — Telephone Encounter (Signed)
2 forms of primary birth control entered wrong- patient switch from IUD to hormonal injection- I called ipledge and she was able to walk me through how to edit those 2 forms- I was able to fix the forms to be hormonal injections and condom. Patient was then able to answer questions.

## 2021-07-20 ENCOUNTER — Telehealth (INDEPENDENT_AMBULATORY_CARE_PROVIDER_SITE_OTHER): Payer: 59 | Admitting: Physician Assistant

## 2021-07-20 ENCOUNTER — Encounter (HOSPITAL_COMMUNITY): Payer: Self-pay | Admitting: Physician Assistant

## 2021-07-20 DIAGNOSIS — F419 Anxiety disorder, unspecified: Secondary | ICD-10-CM | POA: Diagnosis not present

## 2021-07-20 DIAGNOSIS — F319 Bipolar disorder, unspecified: Secondary | ICD-10-CM | POA: Diagnosis not present

## 2021-07-20 DIAGNOSIS — F4312 Post-traumatic stress disorder, chronic: Secondary | ICD-10-CM | POA: Diagnosis not present

## 2021-07-20 MED ORDER — ARIPIPRAZOLE 5 MG PO TABS: 5.0000 mg | ORAL_TABLET | Freq: Every day | ORAL | 1 refills | Status: DC

## 2021-07-20 MED ORDER — LAMOTRIGINE 100 MG PO TABS: 100.0000 mg | ORAL_TABLET | Freq: Two times a day (BID) | ORAL | 1 refills | Status: DC

## 2021-07-20 MED ORDER — GABAPENTIN 300 MG PO CAPS: 600.0000 mg | ORAL_CAPSULE | Freq: Three times a day (TID) | ORAL | 1 refills | Status: DC

## 2021-07-20 NOTE — Progress Notes (Signed)
BH MD/PA/NP OP Progress Note  Virtual Visit via Video Note  I connected with Karen [name] on 07/20/21 at  5:00 PM EDT by a video enabled telemedicine application and verified that I am speaking with the correct person using two identifiers.  Location: Patient: Home Provider: Clinic   I discussed the limitations of evaluation and management by telemedicine and the availability of in person appointments. The patient expressed understanding and agreed to proceed.  Follow Up Instructions:   I discussed the assessment and treatment plan with the patient. The patient was provided an opportunity to ask questions and all were answered. The patient agreed with the plan and demonstrated an understanding of the instructions.   The patient was advised to call back or seek an in-person evaluation if the symptoms worsen or if the condition fails to improve as anticipated.  I provided 25 minutes of non-face-to-face time during this encounter.  Karen Mood, PA    07/20/2021 11:29 PM Sang Gentry  MRN:  [id number]  Chief Complaint: Follow up and medication management  HPI:   Karen [name] is a 30 year old female with a past psychiatric history significant for bipolar disorder, PTSD, and visual hallucinations who presents to [facility] via virtual video visit for follow-up and medication management.  Patient is currently being managed on the following medications:  Abilify 5 mg daily Lamotrigine 100 mg 2 times daily Gabapentin 300 mg 3 times daily  Patient reports that she still feels like she is in a manic state and gets angry easily.  She also endorses feeling super depressed and weepy.  Patient was originally taking Effexor and mirtazapine for the management of her depressive symptoms but states that those medications were not effective in managing her depressive symptoms.  Patient has been taking Abilify consistently and has not had  any issues with the medication.  Patient was inquired about receiving a once a month Abilify injection (Abilify Maintena) instead of her oral medications.  Patient shows interest in the idea.  Patient reports that she will be going on vacation next week and states that she has not been able to function without crying. She reports that her anxiety has been terrible and rates her anxiety a 10 out of 10.  Patient endorses the following symptoms that accompany her anxiety: stomach pain and headaches. Patient continues to experience dissociation but has started to notice lapses in her memory when extremely stressed.  Patient recalls an incident where she does not recall her drive home from work.  Patient states that she feels like she is in her own little world when spaced out as if leaving her body for an hour.  A PHQ-9 screen was performed with the patient scoring a 22.  A GAD-7 screen was also performed with the patient scoring a 21.  A [place] Suicide Severity Rating Scale was performed with the patient being considered moderate risk.  Patient denies being a danger to herself and is able to contract for safety.  Patient is alert and oriented x4, calm, cooperative, and fully engaged in conversation during the encounter.  Patient states that she feels extremely sad and is unable to determine the reason.  Patient denies suicidal or homicidal ideations.  She further denies auditory or visual hallucinations and does not appear to be responding to internal/external stimuli patient endorses good sleep and receives on average 8 to 10 hours of sleep each night.  Patient endorses good appetite and eats on average  2 meals per day.  Patient denies alcohol consumption, tobacco use, and illicit drug use.  Visit Diagnosis:    ICD-10-CM   1. Bipolar 1 disorder (HCC)  F31.9 ARIPiprazole (ABILIFY) 5 MG tablet    lamoTRIgine (LAMICTAL) 100 MG tablet    2. Chronic anxiety  F41.9 gabapentin (NEURONTIN) 300 MG capsule       Past Psychiatric History:  Bipolar disorder Generalized anxiety disorder Visual hallucinations Chronic PTSD  Past Medical History:  Past Medical History:  Diagnosis Date   Anxiety    Bipolar disorder ([place])    Chronic UTI    Depression    Endometriosis    HSV (herpes simplex virus) anogenital infection    PTSD (post-traumatic stress disorder)     Past Surgical History:  Procedure Laterality Date   CESAREAN SECTION N/A 02/17/2018   Procedure: CESAREAN SECTION;  Surgeon: Sanjuana Kava, MD;  Location: [facility];  Service: Obstetrics;  Laterality: N/A;   COLONOSCOPY     DIAGNOSTIC LAPAROSCOPY  11/05/2012   DILATION AND EVACUATION N/A 03/11/2015   Procedure: DILATATION AND EVACUATION;  Surgeon: Waymon Amato, MD;  Location: [facility];  Service: Gynecology;  Laterality: N/A;   DRUG INDUCED ENDOSCOPY     LAPAROSCOPY     LAPAROSCOPY N/A 06/07/2021   Procedure: LAPAROSCOPY OPERATIVE with /EXCISION OF LESIONS and lysis of adhesions;  Surgeon: Waymon Amato, MD;  Location: [facility];  Service: Gynecology;  Laterality: N/A;   TONSILLECTOMY  11/05/2013    Family Psychiatric History:  Mother - Major Depressive Disorder Sister - Obsessive Compulsive Disorder Aunt (maternal) - Bipolar I Disorder Uncle (maternal) - Bipolar I Disorder  Family History:  Family History  Problem Relation Age of Onset   Rheum arthritis Mother    Cancer Father     Social History:  Social History   Socioeconomic History   Marital status: Married    Spouse name: Johnica Raser   Number of children: 1   Years of education: Not on file   Highest education level: Some college, no degree  Occupational History   Not on file  Tobacco Use   Smoking status: Former    Packs/day: 1.00    Types: Cigarettes    Quit date: 03/05/2017    Years since quitting: 4.3   Smokeless tobacco: Never  Vaping Use   Vaping Use: Some days   Substances: Nicotine, Flavoring  Substance and Sexual Activity   Alcohol use: Not  Currently   Drug use: Not Currently    Types: Heroin, Cocaine, Marijuana    Comment: Clean since Aug 2015   Sexual activity: Not Currently  Other Topics Concern   Not on file  Social History Narrative   Not on file   Social Determinants of Health   Financial Resource Strain: Not on file  Food Insecurity: Not on file  Transportation Needs: Not on file  Physical Activity: Not on file  Stress: Not on file  Social Connections: Not on file    Allergies:  Allergies  Allergen Reactions   Ciprofloxacin Hives    Metabolic Disorder Labs: Lab Results  Component Value Date   HGBA1C 4.6 (L) 09/11/2020   MPG 85.32 09/11/2020   No results found for: PROLACTIN Lab Results  Component Value Date   CHOL 126 05/09/2021   TRIG 54 05/09/2021   HDL 35 (L) 05/09/2021   CHOLHDL 3.6 05/09/2021   VLDL 8 09/11/2020   LDLCALC 78 05/09/2021   LDLCALC 87 09/11/2020   Lab Results  Component  Value Date   TSH 0.784 09/11/2020   TSH 0.68 06/21/2020    Therapeutic Level Labs: No results found for: LITHIUM No results found for: VALPROATE No components found for:  CBMZ  Current Medications: Current Outpatient Medications  Medication Sig Dispense Refill   acetaminophen (TYLENOL) 325 MG tablet Take 2 tablets (650 mg total) by mouth every 6 (six) hours as needed for mild pain or moderate pain. 30 tablet 1   ARIPiprazole (ABILIFY) 5 MG tablet Take 1 tablet (5 mg total) by mouth daily. 30 tablet 1   escitalopram (LEXAPRO) 5 MG tablet Take 1 tablet (5 mg total) by mouth daily. 3 tablet 0   gabapentin (NEURONTIN) 300 MG capsule Take 2 capsules (600 mg total) by mouth 3 (three) times daily. 180 capsule 1   ibuprofen (ADVIL) 800 MG tablet Take 1 tablet (800 mg total) by mouth every 8 (eight) hours as needed for moderate pain, cramping or headache. You may start medication after 1 am tonight (8 hours after toradol injection given in the hospital). 30 tablet 0   ISOtretinoin (ACCUTANE) 30 MG capsule  Take 1 capsule (30 mg total) by mouth daily. 30 capsule 0   lamoTRIgine (LAMICTAL) 100 MG tablet Take 1 tablet (100 mg total) by mouth 2 (two) times daily. 60 tablet 1   methocarbamol (ROBAXIN) 500 MG tablet Take 1 tablet (500 mg total) by mouth 2 (two) times daily. 20 tablet 0   mirtazapine (REMERON) 7.5 MG tablet Take 1 tablet (7.5 mg total) by mouth at bedtime. 30 tablet 1   ORILISSA 150 MG TABS Take 150 mg by mouth daily.     oxyCODONE (OXY IR/ROXICODONE) 5 MG immediate release tablet Take 1 tablet (5 mg total) by mouth every 4 (four) hours as needed for severe pain or breakthrough pain (USe oxycodone if ibuprofen and tylenol are not sufficient for pain control.). 25 tablet 0   traMADol (ULTRAM) 50 MG tablet Take 1 tablet (50 mg total) by mouth every 4 (four) hours as needed for moderate pain or severe pain (Pain not well controlled with  midol and ibuprofen). 30 tablet 0   valACYclovir (VALTREX) 1000 MG tablet Take 1 tablet (1,000 mg total) by mouth daily. 90 tablet 1   venlafaxine XR (EFFEXOR XR) 37.5 MG 24 hr capsule Take 1 capsule (37.5 mg total) by mouth daily. 30 capsule 1   No current facility-administered medications for this visit.     Musculoskeletal: Strength & Muscle Tone: Unable to assess due to telemedicine visit Dickson: Unable to assess due to telemedicine visit Patient leans: Unable to assess due to telemedicine visit  Psychiatric Specialty Exam: Review of Systems  Psychiatric/Behavioral:  Negative for decreased concentration, dysphoric [name], hallucinations, self-injury, sleep disturbance and suicidal ideas. The patient is nervous/anxious and is hyperactive.    There were no vitals taken for this visit.There is no height or weight on file to calculate BMI.  General Appearance: Unable to assess due to telemedicine visit  Eye Contact:  Unable to assess due to telemedicine visit  Speech:  Clear and Coherent and Normal Rate  Volume:  Normal  [name]:  Anxious,  Depressed, and Irritable  Affect:  Congruent and Depressed  Thought Process:  Coherent, Goal Directed, and Descriptions of Associations: Intact  Orientation:  Full (Time, Place, and Person)  Thought Content: WDL   Suicidal Thoughts:  No  Homicidal Thoughts:  No  Memory:  Immediate;   Good Recent;   Good Remote;   Good  Judgement:  Good  Insight:  Good  Psychomotor Activity:  Normal  Concentration:  Concentration: Good and Attention Span: Good  Recall:  Good  Fund of Knowledge: Good  Language: Good  Akathisia:  NA  Handed:  Ambidextrous  AIMS (if indicated): not done  Assets:  Communication Skills Desire for Improvement Housing Social Support Vocational/Educational  ADL's:  Intact  Cognition: WNL  Sleep:  Good   Screenings: AIMS    Flowsheet Row Admission (Discharged) from OP Visit from 09/10/2020 in [facility] Admission (Discharged) from OP Visit from 11/12/2019 in [facility]  AIMS Total Score 0 0      AUDIT    Flowsheet Row Admission (Discharged) from OP Visit from 09/10/2020 in [facility] Admission (Discharged) from OP Visit from 11/12/2019 in [facility]  Alcohol Use Disorder Identification Test Final Score (AUDIT) 0 0      GAD-7    Flowsheet Row Video Visit from 07/20/2021 in [facility] Video Visit from 06/01/2021 in [facility] Video Visit from 04/18/2021 in [facility] Video Visit from 01/03/2021 in [facility] Counselor from 09/23/2020 in [facility]  Total GAD-7 Score '21 14 18 18 21      '$ PHQ2-9    Flowsheet Row Video Visit from 07/20/2021 in [facility] Video Visit from 06/01/2021 in [facility] Video Visit from 04/18/2021 in  [facility] Video Visit from 01/03/2021 in [facility] Counselor from 09/23/2020 in [place]  PHQ-2 Total Score '5 6 2 4 4  '$ PHQ-9 Total Score '22 23 5 16 16      '$ Flowsheet Row Video Visit from 07/20/2021 in [facility] Admission (Discharged) from 06/07/2021 in [facility] Video Visit from 06/01/2021 in [place] Moderate Risk No Risk Moderate Risk        Assessment and Plan:   essica Mikaela Kennemore is a 30 year old female with a past psychiatric history significant for bipolar disorder, PTSD, and visual hallucinations who presents to [facility] via virtual video visit for follow-up and medication management.  Patient reports that she continues to experience worsening depression.  Patient states that she is going on vacation next week and fears that she will not be able to function due to her depression.  Patient was recommended taking a 10-day sample of Caplyta 42 mg daily for the management of her bipolar disorder.  Patient was informed to take the medication during her vacation and if she runs into any issues, she can contact the facility.  Patient was agreeable to recommendation.  Patient is requesting refills on all her other medications.  Patient's medications to be e-prescribed to pharmacy of choice.  1. Bipolar 1 disorder ([place]) Patient was provided a 10 day sample of Caplyta 42 mg for the management of her bipolar depression.  - ARIPiprazole (ABILIFY) 5 MG tablet; Take 1 tablet (5 mg total) by mouth daily.  Dispense: 30 tablet; Refill: 1 - lamoTRIgine (LAMICTAL) 100 MG tablet; Take 1 tablet (100 mg total) by mouth 2 (two) times daily.  Dispense: 60 tablet; Refill: 1  2. Chronic anxiety  - gabapentin (NEURONTIN) 300 MG capsule; Take 2 capsules (600 mg total) by mouth  3  (three) times daily.  Dispense: 180 capsule; Refill: 1  3. Chronic post-traumatic stress disorder (PTSD) Patient is no longer taking mirtazapine for the management of he PTSD  Patient to follow up in 6 weeks Provider spent a total of 25 minutes with the patient/reviewing patient's chart  Karen Mood, PA 07/20/2021, 11:29 PM

## 2021-07-24 ENCOUNTER — Encounter: Payer: Self-pay | Admitting: Dermatology

## 2021-07-24 NOTE — Progress Notes (Signed)
   Follow-Up Visit   Subjective  Karen [name] is a 30 y.o. female who presents for the following: Acne (Isotretinoin follow up).  Acne Location:  Duration:  Quality:  Associated Signs/Symptoms: Modifying Factors:  Severity:  Timing: Context:   Objective  Well appearing patient in no apparent distress; mood and affect are within normal limits. Head - Anterior (Face) Perhaps 70% fewer deep inflammatory papules, still some superficial inflammation.  Patient is having some joint achiness after working out at a gym; no objective arthritis.  Mood is stable.  Compliant with I pledge.    A focused examination was performed including head and neck. Relevant physical exam findings are noted in the Assessment and Plan.   Assessment & Plan    Acne vulgaris Head - Anterior (Face)  If GI is stable ok to use ibuprofen before work outs at the gym if she tolerates the otc.  We did discuss lowering the daily dose to potentially reduce her achiness but Karen [name] requested we continue the current dose to prevent prolonging the duration of therapy and I will honor this request.  Maintain I pledge compliance.  Sun protection.  Call with any questions or problems.  Pregnancy, urine - Head - Anterior (Face)  Related Medications ISOtretinoin (ACCUTANE) 30 MG capsule Take 1 capsule (30 mg total) by mouth daily.  Encounter for therapeutic drug monitoring  Related Medications ISOtretinoin (ACCUTANE) 30 MG capsule Take 1 capsule (30 mg total) by mouth daily.      I, Lavonna Monarch, MD, have reviewed all documentation for this visit.  The documentation on 07/24/21 for the exam, diagnosis, procedures, and orders are all accurate and complete.

## 2021-07-27 NOTE — Addendum Note (Signed)
Addended by: Lavonna Monarch on: 07/27/2021 06:42 AM   Modules accepted: Level of Service

## 2021-08-17 ENCOUNTER — Other Ambulatory Visit: Payer: Self-pay

## 2021-08-17 ENCOUNTER — Ambulatory Visit: Payer: 59 | Admitting: Physician Assistant

## 2021-08-17 ENCOUNTER — Encounter: Payer: Self-pay | Admitting: Physician Assistant

## 2021-08-17 ENCOUNTER — Ambulatory Visit (INDEPENDENT_AMBULATORY_CARE_PROVIDER_SITE_OTHER): Payer: 59 | Admitting: Physician Assistant

## 2021-08-17 DIAGNOSIS — L700 Acne vulgaris: Secondary | ICD-10-CM

## 2021-08-17 DIAGNOSIS — Z5181 Encounter for therapeutic drug level monitoring: Secondary | ICD-10-CM | POA: Diagnosis not present

## 2021-08-17 MED ORDER — ISOTRETINOIN 30 MG PO CAPS
30.0000 mg | ORAL_CAPSULE | Freq: Every day | ORAL | 0 refills | Status: DC
Start: 2021-08-17 — End: 2021-09-18

## 2021-08-17 MED ORDER — MUPIROCIN 2 % EX OINT: 1.0000 "application " | TOPICAL_OINTMENT | Freq: Two times a day (BID) | CUTANEOUS | 3 refills | Status: DC

## 2021-08-17 NOTE — Progress Notes (Signed)
   Follow-Up Visit   Subjective  Karen [name] is a 30 y.o. female who presents for the following: Acne (31 day follow up). Doing well with no severe side effects. Two areas of irritation- painful and persistent x 3 weeks.   The following portions of the chart were reviewed this encounter and updated as appropriate:  Tobacco  Allergies  Meds  Problems  Med Hx  Surg Hx  Fam Hx      Objective  Well appearing patient in no apparent distress; mood and affect are within normal limits.  A full examination was performed including scalp, head, eyes, ears, nose, lips, neck. All findings within normal limits unless otherwise noted below.  Head - Anterior (Face) Erythematous papule on left cheek. Right angular crease of her mouth is severely irritated with crusting and cracking.   Assessment & Plan  Acne vulgaris Head - Anterior (Face)  Impoyz sample for corner of mouth nightly x 1-2 weeks maximum.  Pregnancy, urine - Head - Anterior (Face)  ISOtretinoin (ACCUTANE) 30 MG capsule - Head - Anterior (Face) Take 1 capsule (30 mg total) by mouth daily.  mupirocin ointment (BACTROBAN) 2 % - Head - Anterior (Face) Apply 1 application topically 2 (two) times daily. Apply to affected area qd-bid  Encounter for therapeutic drug monitoring  Related Procedures Pregnancy, urine  Related Medications ISOtretinoin (ACCUTANE) 30 MG capsule Take 1 capsule (30 mg total) by mouth daily.    I, Everlene Cunning, PA-C, have reviewed all documentation's for this visit.  The documentation on 08/17/21 for the exam, diagnosis, procedures and orders are all accurate and complete.

## 2021-08-18 LAB — PREGNANCY, URINE: Preg Test, Ur: NEGATIVE

## 2021-08-21 ENCOUNTER — Ambulatory Visit: Payer: 59 | Admitting: Dermatology

## 2021-09-05 ENCOUNTER — Telehealth (HOSPITAL_COMMUNITY): Payer: Self-pay | Admitting: *Deleted

## 2021-09-05 NOTE — Telephone Encounter (Signed)
Provider was contacted by Glory Buff. Olevia Bowens, RN regarding voicemail left by this patient. Patient has an appointment scheduled for tomorrow. Provider will discuss medication options during patient's follow up appointment.

## 2021-09-05 NOTE — Telephone Encounter (Signed)
VM from patient stating Eddie PA put her on new medicine and "it hasnt worked out" and he had mentioned he might put her on Wellbutrin, and she would like him to call her and discuss this or another medicine change.

## 2021-09-06 ENCOUNTER — Other Ambulatory Visit: Payer: Self-pay

## 2021-09-06 ENCOUNTER — Telehealth (INDEPENDENT_AMBULATORY_CARE_PROVIDER_SITE_OTHER): Payer: 59 | Admitting: Physician Assistant

## 2021-09-06 DIAGNOSIS — F419 Anxiety disorder, unspecified: Secondary | ICD-10-CM | POA: Diagnosis not present

## 2021-09-06 DIAGNOSIS — F319 Bipolar disorder, unspecified: Secondary | ICD-10-CM | POA: Diagnosis not present

## 2021-09-06 DIAGNOSIS — F4312 Post-traumatic stress disorder, chronic: Secondary | ICD-10-CM | POA: Diagnosis not present

## 2021-09-06 MED ORDER — GABAPENTIN 300 MG PO CAPS: 600.0000 mg | ORAL_CAPSULE | Freq: Three times a day (TID) | ORAL | 1 refills | Status: DC

## 2021-09-06 MED ORDER — ARIPIPRAZOLE 5 MG PO TABS: 5.0000 mg | ORAL_TABLET | Freq: Every day | ORAL | 1 refills | Status: DC

## 2021-09-06 MED ORDER — BUPROPION HCL ER (XL) 150 MG PO TB24: 150.0000 mg | ORAL_TABLET | Freq: Every day | ORAL | 1 refills | Status: DC

## 2021-09-06 MED ORDER — LAMOTRIGINE 100 MG PO TABS: 100.0000 mg | ORAL_TABLET | Freq: Two times a day (BID) | ORAL | 1 refills | Status: DC

## 2021-09-06 NOTE — Progress Notes (Signed)
[place] MD/Karen [name]/NP OP Progress Note  Virtual Visit via Video Note  I connected with Karen [name] on 09/06/21 at  5:00 PM EDT by a video enabled telemedicine application and verified that I am speaking with the correct person using two identifiers.  Location: Patient: Home Provider: Clinic   I discussed the limitations of evaluation and management by telemedicine and the availability of in person appointments. The patient expressed understanding and agreed to proceed.  Follow Up Instructions:  I discussed the assessment and treatment plan with the patient. The patient was provided an opportunity to ask questions and all were answered. The patient agreed with the plan and demonstrated an understanding of the instructions.   The patient was advised to call back or seek an in-person evaluation if the symptoms worsen or if the condition fails to improve as anticipated.  I provided 15 minutes of non-face-to-face time during this encounter.  Karen [name], Karen [name]    09/06/2021 5:07 PM Karen [name]  MRN:  [id number]  Chief Complaint: Follow up and medication management  HPI:   Karen [name] is a 30 year old female with a past psychiatric history significant for chronic PTSD, bipolar disorder, and chronic anxiety who presents to [facility] via virtual video visit for follow-up and medication management.  Patient is currently being managed on the following medications:  Abilify 5 mg daily Lamotrigine 100 mg 2 times daily Gabapentin 600 mg 3 times daily  In addition to her current regimen of medications, patient was placed on a sample of Caplyta 42 mg daily during her last encounter.  Patient reports that the Caplyta was way too heavy and sedating even when she took it at night.  Patient reports that she has not felt manic as of late but still endorses experiencing depressive symptoms.  Patient endorses the following depressive symptoms: self  isolating from peers, decreased desire to socialize, increased sleeping, decreased appetite, lack of focus, and low [name].  Patient states that she finds herself canceling plans on a whim.  She does express that her anxiety has been much more manageable as of late.  Patient's main stressor includes traveling way too much for work.  Due to the constant traveling she has to do for work, patient is currently looking for another job.  Patient shows interest in being placed on Wellbutrin for the management of her depressive symptoms.  Patient was informed of side effects associated with taking Wellbutrin to which patient vocalized understanding.  A PHQ-9 screen was performed with the patient scoring to 22.  A GAD-7 screen was also performed with the patient scoring a 14.  A [place] Suicide Severity Rating Scale was performed with the patient being considered moderate risk.  Patient is alert and oriented x4, calm, cooperative, and fully engaged in conversation during the encounter.  Patient endorses okay [name] and states that she is not as depressed today.  Patient denies suicidal or homicidal ideations.  She further denies auditory or visual hallucinations and does not appear to be responding to internal/external stimuli.  Patient endorses and receives on average 8 hours of restful sleep each night.  Patient endorses fair appetite and eats on average 2 meals per day.  Patient denies alcohol consumption and illicit drug use.  Patient denies tobacco use but states that she does engage in [place].  Visit Diagnosis:    ICD-10-CM   1. Chronic post-traumatic stress disorder (PTSD)  F43.12     2. Bipolar 1 disorder ([place])  F31.9 ARIPiprazole (ABILIFY) 5 MG tablet    lamoTRIgine (LAMICTAL) 100 MG tablet    buPROPion (WELLBUTRIN XL) 150 MG 24 hr tablet    3. Chronic anxiety  F41.9 gabapentin (NEURONTIN) 300 MG capsule      Past Psychiatric History:  Bipolar disorder Generalized anxiety disorder Visual  hallucinations Chronic PTSD  Past Medical History:  Past Medical History:  Diagnosis Date   Anxiety    Bipolar disorder ([place])    Chronic UTI    Depression    Endometriosis    HSV (herpes simplex virus) anogenital infection    PTSD (post-traumatic stress disorder)     Past Surgical History:  Procedure Laterality Date   CESAREAN SECTION N/A 02/17/2018   Procedure: CESAREAN SECTION;  Surgeon: Sanjuana Kava, MD;  Location: [place];  Service: Obstetrics;  Laterality: N/A;   COLONOSCOPY     DIAGNOSTIC LAPAROSCOPY  11/05/2012   DILATION AND EVACUATION N/A 03/11/2015   Procedure: DILATATION AND EVACUATION;  Surgeon: Waymon Amato, MD;  Location: [facility];  Service: Gynecology;  Laterality: N/A;   DRUG INDUCED ENDOSCOPY     LAPAROSCOPY     LAPAROSCOPY N/A 06/07/2021   Procedure: LAPAROSCOPY OPERATIVE with /EXCISION OF LESIONS and lysis of adhesions;  Surgeon: Waymon Amato, MD;  Location: [place];  Service: Gynecology;  Laterality: N/A;   TONSILLECTOMY  11/05/2013    Family Psychiatric History:  Mother - Major Depressive Disorder Sister - Obsessive Compulsive Disorder Aunt (maternal) - Bipolar I Disorder Uncle (maternal) - Bipolar I Disorder  Family History:  Family History  Problem Relation Age of Onset   Rheum arthritis Mother    Cancer Father     Social History:  Social History   Socioeconomic History   Marital status: Married    Spouse name: Karen [name]   Number of children: 1   Years of education: Not on file   Highest education level: Some college, no degree  Occupational History   Not on file  Tobacco Use   Smoking status: Former    Packs/day: 1.00    Types: Cigarettes    Quit date: 03/05/2017    Years since quitting: 4.5   Smokeless tobacco: Never  Vaping Use   Vaping Use: Some days   Substances: Nicotine, Flavoring  Substance and Sexual Activity   Alcohol use: Not Currently   Drug use: Not Currently    Types: Heroin, Cocaine, Marijuana    Comment: Clean  since Aug 2015   Sexual activity: Not Currently  Other Topics Concern   Not on file  Social History Narrative   Not on file   Social Determinants of Health   Financial Resource Strain: Not on file  Food Insecurity: Not on file  Transportation Needs: Not on file  Physical Activity: Not on file  Stress: Not on file  Social Connections: Not on file    Allergies:  Allergies  Allergen Reactions   Ciprofloxacin Hives    Metabolic Disorder Labs: Lab Results  Component Value Date   HGBA1C 4.6 (L) 09/11/2020   MPG 85.32 09/11/2020   No results found for: PROLACTIN Lab Results  Component Value Date   CHOL 126 05/09/2021   TRIG 54 05/09/2021   HDL 35 (L) 05/09/2021   CHOLHDL 3.6 05/09/2021   VLDL 8 09/11/2020   LDLCALC 78 05/09/2021   LDLCALC 87 09/11/2020   Lab Results  Component Value Date   TSH 0.784 09/11/2020   TSH 0.68 06/21/2020    Therapeutic Level Labs: No  results found for: LITHIUM No results found for: VALPROATE No components found for:  CBMZ  Current Medications: Current Outpatient Medications  Medication Sig Dispense Refill   buPROPion (WELLBUTRIN XL) 150 MG 24 hr tablet Take 1 tablet (150 mg total) by mouth daily. 30 tablet 1   acetaminophen (TYLENOL) 325 MG tablet Take 2 tablets (650 mg total) by mouth every 6 (six) hours as needed for mild pain or moderate pain. 30 tablet 1   ARIPiprazole (ABILIFY) 5 MG tablet Take 1 tablet (5 mg total) by mouth daily. 30 tablet 1   gabapentin (NEURONTIN) 300 MG capsule Take 2 capsules (600 mg total) by mouth 3 (three) times daily. 180 capsule 1   ibuprofen (ADVIL) 800 MG tablet Take 1 tablet (800 mg total) by mouth every 8 (eight) hours as needed for moderate pain, cramping or headache. You may start medication after 1 am tonight (8 hours after toradol injection given in the hospital). 30 tablet 0   ISOtretinoin (ACCUTANE) 30 MG capsule Take 1 capsule (30 mg total) by mouth daily. 30 capsule 0   lamoTRIgine (LAMICTAL)  100 MG tablet Take 1 tablet (100 mg total) by mouth 2 (two) times daily. 60 tablet 1   methocarbamol (ROBAXIN) 500 MG tablet Take 1 tablet (500 mg total) by mouth 2 (two) times daily. 20 tablet 0   mupirocin ointment (BACTROBAN) 2 % Apply 1 application topically 2 (two) times daily. Apply to affected area qd-bid 22 g 3   ORILISSA 150 MG TABS Take 150 mg by mouth daily.     oxyCODONE (OXY IR/ROXICODONE) 5 MG immediate release tablet Take 1 tablet (5 mg total) by mouth every 4 (four) hours as needed for severe pain or breakthrough pain (USe oxycodone if ibuprofen and tylenol are not sufficient for pain control.). 25 tablet 0   traMADol (ULTRAM) 50 MG tablet Take 1 tablet (50 mg total) by mouth every 4 (four) hours as needed for moderate pain or severe pain (Pain not well controlled with  midol and ibuprofen). 30 tablet 0   valACYclovir (VALTREX) 1000 MG tablet Take 1 tablet (1,000 mg total) by mouth daily. 90 tablet 1   No current facility-administered medications for this visit.     Musculoskeletal: Strength & Muscle Tone: Unable to assess due to telemedicine visit [place]: Unable to assess due to telemedicine visit Patient leans: Unable to assess due to telemedicine visit  Psychiatric Specialty Exam: Review of Systems  Psychiatric/Behavioral:  Positive for decreased concentration. Negative for dysphoric [name], hallucinations, self-injury, sleep disturbance and suicidal ideas. The patient is nervous/anxious. The patient is not hyperactive.    There were no vitals taken for this visit.There is no height or weight on file to calculate BMI.  General Appearance: Well Groomed  Eye Contact:  Good  Speech:  Clear and Coherent and Normal Rate  Volume:  Normal  [name]:  Anxious and Depressed  Affect:  Congruent  Thought Process:  Coherent, Goal Directed, and Descriptions of Associations: Intact  Orientation:  Full (Time, Place, and Person)  Thought Content: WDL and Logical   Suicidal  Thoughts:  No  Homicidal Thoughts:  No  Memory:  Immediate;   Good Recent;   Good Remote;   Good  Judgement:  Good  Insight:  Good  Psychomotor Activity:  Normal  Concentration:  Concentration: Good and Attention Span: Good  Recall:  Good  Fund of Knowledge: Good  Language: Good  Akathisia:  NA  Handed:  Ambidextrous  AIMS (if  indicated): not done  Assets:  Communication Skills Desire for Improvement Housing Social Support Vocational/Educational  ADL's:  Intact  Cognition: WNL  Sleep:  Good   Screenings: AIMS    Flowsheet Row Admission (Discharged) from OP Visit from 09/10/2020 in [facility] Admission (Discharged) from OP Visit from 11/12/2019 in [place] [facility]  AIMS Total Score 0 0      AUDIT    Flowsheet Row Admission (Discharged) from OP Visit from 09/10/2020 in [facility] [facility] Admission (Discharged) from OP Visit from 11/12/2019 in [place] [facility]  Alcohol Use Disorder Identification Test Final Score (AUDIT) 0 0      GAD-7    Flowsheet Row Video Visit from 09/06/2021 in [facility] Video Visit from 07/20/2021 in [facility] Video Visit from 06/01/2021 in [facility] Video Visit from 04/18/2021 in [facility] Video Visit from 01/03/2021 in [facility]  Total GAD-7 Score 14 21 14 18 18       PHQ2-9    Flowsheet Row Video Visit from 09/06/2021 in [facility] Video Visit from 07/20/2021 in [facility] Video Visit from 06/01/2021 in [facility] Video Visit from 04/18/2021 in [facility] Video Visit from 01/03/2021 in [place]  PHQ-2 Total Score 5 5 6 2 4   PHQ-9 Total Score 22  22 23 5 16       Flowsheet Row Video Visit from 09/06/2021 in [facility] Video Visit from 07/20/2021 in [facility] Admission (Discharged) from 06/07/2021 in [place] CATEGORY Moderate Risk Moderate Risk No Risk        Assessment and Plan:   Karen [name] is a 30 year old female with a past psychiatric history significant for chronic PTSD, bipolar disorder, and chronic anxiety who presents to [facility] via virtual video visit for follow-up and medication management.  Patient reports that Caplyta was not helpful in the management of her symptoms stating that the medication was too sedating.  Patient still deals with ongoing depression and is interested in being placed on Wellbutrin.  Provider to place patient on bupropion (Wellbutrin XL) 300 mg 24-hour tablet daily for the management of her depressive symptoms.  Patient to continue taking all of the medications as prescribed.  Patient's medications to be e-prescribed to pharmacy of choice.  1. Bipolar 1 disorder (H[facility])  - ARIPiprazole (ABILIFY) 5 MG tablet; Take 1 tablet (5 mg total) by mouth daily.  Dispense: 30 tablet; Refill: 1 - lamoTRIgine (LAMICTAL) 100 MG tablet; Take 1 tablet (100 mg total) by mouth 2 (two) times daily.  Dispense: 60 tablet; Refill: 1 - buPROPion (WELLBUTRIN XL) 150 MG 24 hr tablet; Take 1 tablet (150 mg total) by mouth daily.  Dispense: 30 tablet; Refill: 1  2. Chronic anxiety  - gabapentin (NEURONTIN) 300 MG capsule; Take 2 capsules (600 mg total) by mouth 3 (three) times daily.  Dispense: 180 capsule; Refill: 1  3. Chronic post-traumatic stress disorder (PTSD)  Patient to follow up in 7 weeks Provider spent a total of 15 minutes with the patient/reviewing the patient's chart  Karen [name], Karen [name] 09/06/2021, 5:07 PM

## 2021-09-07 ENCOUNTER — Encounter (HOSPITAL_COMMUNITY): Payer: Self-pay | Admitting: Physician Assistant

## 2021-09-18 ENCOUNTER — Encounter: Payer: Self-pay | Admitting: Physician Assistant

## 2021-09-18 ENCOUNTER — Ambulatory Visit (INDEPENDENT_AMBULATORY_CARE_PROVIDER_SITE_OTHER): Payer: 59 | Admitting: Physician Assistant

## 2021-09-18 ENCOUNTER — Other Ambulatory Visit: Payer: Self-pay

## 2021-09-18 DIAGNOSIS — L700 Acne vulgaris: Secondary | ICD-10-CM

## 2021-09-18 DIAGNOSIS — Z5181 Encounter for therapeutic drug level monitoring: Secondary | ICD-10-CM | POA: Diagnosis not present

## 2021-09-18 MED ORDER — ISOTRETINOIN 30 MG PO CAPS
30.0000 mg | ORAL_CAPSULE | Freq: Every day | ORAL | 0 refills | Status: DC
Start: 2021-09-18 — End: 2022-07-01

## 2021-09-18 NOTE — Patient Instructions (Signed)
Continue Impoyz on your face for one month.

## 2021-09-19 ENCOUNTER — Encounter: Payer: Self-pay | Admitting: Physician Assistant

## 2021-09-19 LAB — PREGNANCY, URINE: Preg Test, Ur: NEGATIVE

## 2021-09-19 NOTE — Progress Notes (Signed)
   Follow-Up Visit   Subjective  Karen [name] is a 30 y.o. female who presents for the following: Acne (31 days check for isotretinoin.  She has a crusted spot on her right cheek acne -same lesion as last month ). Denies picking.    The following portions of the chart were reviewed this encounter and updated as appropriate:  Tobacco  Allergies  Meds  Problems  Med Hx  Surg Hx  Fam Hx      Objective  Well appearing patient in no apparent distress; mood and affect are within normal limits.  A focused examination was performed including face. Relevant physical exam findings are noted in the Assessment and Plan.  Head - Anterior (Face) Pitted post inflammatory hyperpigmentation with one large crusty nodule on her right cheek.   Assessment & Plan  Acne vulgaris Head - Anterior (Face)  Continue impoyz sample.3x per week. Cover with bandaid whenever possible to avoid picking.   ISOtretinoin (ACCUTANE) 30 MG capsule - Head - Anterior (Face) Take 1 capsule (30 mg total) by mouth daily.  Pregnancy, urine - Head - Anterior (Face)  Related Medications mupirocin ointment (BACTROBAN) 2 % Apply 1 application topically 2 (two) times daily. Apply to affected area qd-bid  Encounter for therapeutic drug monitoring  Related Procedures Pregnancy, urine  Related Medications ISOtretinoin (ACCUTANE) 30 MG capsule Take 1 capsule (30 mg total) by mouth daily.    I, Law Corsino, PA-C, have reviewed all documentation's for this visit.  The documentation on 09/19/21 for the exam, diagnosis, procedures and orders are all accurate and complete.

## 2021-09-21 ENCOUNTER — Ambulatory Visit: Payer: 59 | Admitting: Dermatology

## 2021-10-10 ENCOUNTER — Telehealth: Payer: Self-pay | Admitting: Physician Assistant

## 2021-10-10 NOTE — Telephone Encounter (Signed)
Patient calling to see if we can see her sooner for her Accutane visit due to lapse in insurance starting on Dec 15th. I informed her that would be too soon. Instead of paying out of pocket for visit and medication that month she has opted to stop using Accutane until her insurance is active again. Sending to clinical staff as Juluis Rainier only because patient is concerned this will cause issues with her test results for ipledge.

## 2021-10-23 ENCOUNTER — Ambulatory Visit: Payer: 59 | Admitting: Dermatology

## 2021-10-31 ENCOUNTER — Telehealth (INDEPENDENT_AMBULATORY_CARE_PROVIDER_SITE_OTHER): Payer: 59 | Admitting: Physician Assistant

## 2021-10-31 ENCOUNTER — Encounter (HOSPITAL_COMMUNITY): Payer: Self-pay | Admitting: Physician Assistant

## 2021-10-31 DIAGNOSIS — F419 Anxiety disorder, unspecified: Secondary | ICD-10-CM

## 2021-10-31 DIAGNOSIS — F4312 Post-traumatic stress disorder, chronic: Secondary | ICD-10-CM

## 2021-10-31 DIAGNOSIS — F319 Bipolar disorder, unspecified: Secondary | ICD-10-CM

## 2021-10-31 MED ORDER — ARIPIPRAZOLE 5 MG PO TABS: 5.0000 mg | ORAL_TABLET | Freq: Every day | ORAL | 1 refills | Status: DC

## 2021-10-31 MED ORDER — BUPROPION HCL ER (XL) 150 MG PO TB24: 150.0000 mg | ORAL_TABLET | Freq: Every day | ORAL | 1 refills | Status: DC

## 2021-10-31 MED ORDER — LAMOTRIGINE 100 MG PO TABS: 100.0000 mg | ORAL_TABLET | Freq: Two times a day (BID) | ORAL | 1 refills | Status: DC

## 2021-10-31 NOTE — Progress Notes (Cosign Needed Addendum)
[name] MD/PA/NP OP Progress Note  Virtual Visit via Telephone Note  I connected with Karen [name] on 10/31/21 at  4:30 PM EST by telephone and verified that I am speaking with the correct person using two identifiers.  Location: Patient: Home Provider: Clinic   I discussed the limitations, risks, security and privacy concerns of performing an evaluation and management service by telephone and the availability of in person appointments. I also discussed with the patient that there may be a patient responsible charge related to this service. The patient expressed understanding and agreed to proceed.  Follow Up Instructions:  I discussed the assessment and treatment plan with the patient. The patient was provided an opportunity to ask questions and all were answered. The patient agreed with the plan and demonstrated an understanding of the instructions.   The patient was advised to call back or seek an in-person evaluation if the symptoms worsen or if the condition fails to improve as anticipated.  I provided 14 minutes of non-face-to-face time during this encounter.  Karen Mood, PA   10/31/2021 10:20 PM Karen [name]  MRN:  [id number]  Chief Complaint: Follow up and medication management  HPI:   Karen [name] is a 30 year old female with a past psychiatric history significant for chronic PTSD, bipolar disorder, and chronic anxiety who presents to [facility] via virtual telephone visit for follow-up and medication management.  Patient is currently being managed on the following medications:  Abilify 5 mg daily Lamotrigine 100 mg 2 times daily Bupropion 150 mg 24-hour tablet daily Gabapentin 600 mg 3 times daily  Patient reports no issues or concerns regarding her current medication regimen.  Patient reports that she is doing well and that she is remaining through the during the holidays.  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  Patient denies experiencing any depressive symptoms at this time.  Patient denies any major changes to her anxiety and denies any new stressors at this time.  A PHQ-9 screen was performed with the patient scoring a 9.  A GAD-7 screen was also performed with the patient scoring a 5.  Patient is alert and oriented x4, calm, cooperative, and fully engaged in conversation during the encounter.  Patient endorses controlled [name].  Patient denies suicidal or homicidal ideations.  She further denies auditory or visual hallucinations.  Patient endorses fair sleep and receives on average 8 hours of sleep each night.  She reports that the amount of sleep that she receives is not quality sleep.  Patient endorses good appetite and eats on average 2 meals per day.  Patient denies alcohol consumption, tobacco use, and illicit drug use.  Visit Diagnosis:    ICD-10-CM   1. Chronic post-traumatic stress disorder (PTSD)  F43.12     2. Bipolar 1 disorder (HCC)  F31.9 buPROPion (WELLBUTRIN XL) 150 MG 24 hr tablet    lamoTRIgine (LAMICTAL) 100 MG tablet    ARIPiprazole (ABILIFY) 5 MG tablet    3. Chronic anxiety  F41.9       Past Psychiatric History:  Bipolar disorder Generalized anxiety disorder Visual hallucinations Chronic PTSD  Past Medical History:  Past Medical History:  Diagnosis Date   Anxiety    Bipolar disorder ([place])    Chronic UTI    Depression    Endometriosis    HSV (herpes simplex virus) anogenital infection    PTSD (post-traumatic  stress disorder)     Past Surgical History:  Procedure Laterality Date   CESAREAN SECTION N/A 02/17/2018   Procedure: CESAREAN SECTION;  Surgeon: Sanjuana Kava, MD;  Location: [place];  Service: Obstetrics;  Laterality: N/A;   COLONOSCOPY     DIAGNOSTIC LAPAROSCOPY  11/05/2012   DILATION AND EVACUATION N/A 03/11/2015   Procedure: DILATATION AND EVACUATION;  Surgeon: Waymon Amato,  MD;  Location: [facility];  Service: Gynecology;  Laterality: N/A;   DRUG INDUCED ENDOSCOPY     LAPAROSCOPY     LAPAROSCOPY N/A 06/07/2021   Procedure: LAPAROSCOPY OPERATIVE with /EXCISION OF LESIONS and lysis of adhesions;  Surgeon: Waymon Amato, MD;  Location: [place];  Service: Gynecology;  Laterality: N/A;   TONSILLECTOMY  11/05/2013    Family Psychiatric History:  Mother - Major Depressive Disorder Sister - Obsessive Compulsive Disorder Aunt (maternal) - Bipolar I Disorder Uncle (maternal) - Bipolar I Disorder  Family History:  Family History  Problem Relation Age of Onset   Rheum arthritis Mother    Cancer Father     Social History:  Social History   Socioeconomic History   Marital status: Married    Spouse name: Karen [name]   Number of children: 1   Years of education: Not on file   Highest education level: Some college, no degree  Occupational History   Not on file  Tobacco Use   Smoking status: Former    Packs/day: 1.00    Types: Cigarettes    Quit date: 03/05/2017    Years since quitting: 4.6   Smokeless tobacco: Never  Vaping Use   Vaping Use: Some days   Substances: Nicotine, Flavoring  Substance and Sexual Activity   Alcohol use: Not Currently   Drug use: Not Currently    Types: Heroin, Cocaine, Marijuana    Comment: Clean since Aug 2015   Sexual activity: Not Currently  Other Topics Concern   Not on file  Social History Narrative   Not on file   Social Determinants of Health   Financial Resource Strain: Not on file  Food Insecurity: Not on file  Transportation Needs: Not on file  Physical Activity: Not on file  Stress: Not on file  Social Connections: Not on file    Allergies:  Allergies  Allergen Reactions   Ciprofloxacin Hives    Metabolic Disorder Labs: Lab Results  Component Value Date   HGBA1C 4.6 (L) 09/11/2020   MPG 85.32 09/11/2020   No results found for: PROLACTIN Lab Results  Component Value Date   CHOL 126 05/09/2021   TRIG  54 05/09/2021   HDL 35 (L) 05/09/2021   CHOLHDL 3.6 05/09/2021   VLDL 8 09/11/2020   LDLCALC 78 05/09/2021   LDLCALC 87 09/11/2020   Lab Results  Component Value Date   TSH 0.784 09/11/2020   TSH 0.68 06/21/2020    Therapeutic Level Labs: No results found for: LITHIUM No results found for: VALPROATE No components found for:  CBMZ  Current Medications: Current Outpatient Medications  Medication Sig Dispense Refill   acetaminophen (TYLENOL) 325 MG tablet Take 2 tablets (650 mg total) by mouth every 6 (six) hours as needed for mild pain or moderate pain. 30 tablet 1   ARIPiprazole (ABILIFY) 5 MG tablet Take 1 tablet (5 mg total) by mouth daily. 30 tablet 1   buPROPion (WELLBUTRIN XL) 150 MG 24 hr tablet Take 1 tablet (150 mg total) by mouth daily. 30 tablet 1   gabapentin (NEURONTIN) 300 MG  capsule Take 2 capsules (600 mg total) by mouth 3 (three) times daily. 180 capsule 1   ibuprofen (ADVIL) 800 MG tablet Take 1 tablet (800 mg total) by mouth every 8 (eight) hours as needed for moderate pain, cramping or headache. You may start medication after 1 am tonight (8 hours after toradol injection given in the hospital). 30 tablet 0   ISOtretinoin (ACCUTANE) 30 MG capsule Take 1 capsule (30 mg total) by mouth daily. 30 capsule 0   lamoTRIgine (LAMICTAL) 100 MG tablet Take 1 tablet (100 mg total) by mouth 2 (two) times daily. 60 tablet 1   methocarbamol (ROBAXIN) 500 MG tablet Take 1 tablet (500 mg total) by mouth 2 (two) times daily. 20 tablet 0   mupirocin ointment (BACTROBAN) 2 % Apply 1 application topically 2 (two) times daily. Apply to affected area qd-bid 22 g 3   ORILISSA 150 MG TABS Take 150 mg by mouth daily.     oxyCODONE (OXY IR/ROXICODONE) 5 MG immediate release tablet Take 1 tablet (5 mg total) by mouth every 4 (four) hours as needed for severe pain or breakthrough pain (USe oxycodone if ibuprofen and tylenol are not sufficient for pain control.). 25 tablet 0   traMADol (ULTRAM)  50 MG tablet Take 1 tablet (50 mg total) by mouth every 4 (four) hours as needed for moderate pain or severe pain (Pain not well controlled with  midol and ibuprofen). 30 tablet 0   valACYclovir (VALTREX) 1000 MG tablet Take 1 tablet (1,000 mg total) by mouth daily. 90 tablet 1   No current facility-administered medications for this visit.     Musculoskeletal: Strength & Muscle Tone: Unable to assess due to telemedicine visit [place]: Unable to assess due to telemedicine visit Patient leans: Unable to assess due to telemedicine visit  Psychiatric Specialty Exam: Review of Systems  Psychiatric/Behavioral:  Positive for sleep disturbance. Negative for decreased concentration, dysphoric [name], hallucinations, self-injury and suicidal ideas. The patient is nervous/anxious. The patient is not hyperactive.    There were no vitals taken for this visit.There is no height or weight on file to calculate BMI.  General Appearance: Unable to assess due to telemedicine visit  Eye Contact:  Unable to assess due to telemedicine visit  Speech:  Clear and Coherent and Normal Rate  Volume:  Normal  [name]:  Anxious and Euthymic  Affect:  Appropriate and Congruent  Thought Process:  Coherent and Descriptions of Associations: Intact  Orientation:  Full (Time, Place, and Person)  Thought Content: WDL   Suicidal Thoughts:  No  Homicidal Thoughts:  No  Memory:  Immediate;   Good Recent;   Good Remote;   Good  Judgement:  Good  Insight:  Good  Psychomotor Activity:  Normal  Concentration:  Concentration: Good and Attention Span: Good  Recall:  Good  Fund of Knowledge: Good  Language: Good  Akathisia:  NA  Handed:  Ambidextrous  AIMS (if indicated): not done  Assets:  Communication Skills Desire for Improvement Housing Social Support Vocational/Educational  ADL's:  Intact  Cognition: WNL  Sleep:  Fair   Screenings: [place] Admission (Discharged) from OP Visit from  09/10/2020 in [place] [facility] Admission (Discharged) from OP Visit from 11/12/2019 in [place] [facility]  AIMS Total Score 0 0      AUDIT    Flowsheet Row Admission (Discharged) from OP Visit from 09/10/2020 in [place] [facility]  Admission (Discharged) from OP Visit from 11/12/2019 in [place] [facility]  Alcohol Use Disorder Identification Test Final Score (AUDIT) 0 0      GAD-7    Flowsheet Row Video Visit from 10/31/2021 in [facility] Video Visit from 09[facility]06/2021 in [facility] Video Visit from 07/20/2021 in [facility] Video Visit from 06/01/2021 in [facility] Video Visit from 04/18/2021 in [facility]  Total GAD-7 Score 5 14 21 14 18      $ [id number]    Flowsheet Row Video Visit from 10/31/2021 in [facility] Video Visit from 09/06/2021 in [facility] Video Visit from 07/20/2021 in [facility] Video Visit from 06/01/2021 in [facility] Video Visit from 04/18/2021 in [place]  PHQ-2 Total Score 2 5 5 6 2  $ PHQ-9 Total Score 9 22 22 23 5      $ Flowsheet Row Video Visit from 10/31/2021 in [facility] Video Visit from 09/06/2021 in [facility] Video Visit from 07/20/2021 in [place] CATEGORY Moderate Risk Moderate Risk Moderate Risk        Assessment and Plan:   Karen [name] is a 30 year old female with a past psychiatric history significant for chronic PTSD, bipolar disorder, and chronic anxiety who presents to [facility] via virtual telephone visit for  follow-up and medication management.  Patient denies any major depressive at this time.  She also denies any major changes in her anxiety.  Patient reports no issues with her current medication regimen and is requesting refills on her medications following the conclusion of the encounter.  1. Bipolar 1 disorder (HCC)  - buPROPion (WELLBUTRIN XL) 150 MG 24 hr tablet; Take 1 tablet (150 mg total) by mouth daily.  Dispense: 30 tablet; Refill: 1 - lamoTRIgine (LAMICTAL) 100 MG tablet; Take 1 tablet (100 mg total) by mouth 2 (two) times daily.  Dispense: 60 tablet; Refill: 1 - ARIPiprazole (ABILIFY) 5 MG tablet; Take 1 tablet (5 mg total) by mouth daily.  Dispense: 30 tablet; Refill: 1  2. Chronic post-traumatic stress disorder (PTSD)   3. Chronic anxiety Patient to continue taking gabapentin 600 mg 3 times daily for the management of her chronic anxiety  Patient to follow up in 2 months Provider spent a total of 14 minutes with the patient/reviewing patient's chart  Karen Mood, PA 10/31/2021, 10:20 PM

## 2021-11-14 ENCOUNTER — Telehealth (HOSPITAL_COMMUNITY): Payer: Self-pay | Admitting: Physician Assistant

## 2021-11-14 NOTE — Telephone Encounter (Signed)
Patient contacted office to inform provider of episode of disassociation on yesterday (11/13/21). She is wanting to speak with provider about these concerns and her treatment. Writer informed that provider is working remotely today and will respond to message at earliest convenience.

## 2021-11-20 NOTE — Telephone Encounter (Signed)
Provider was contacted by Arbutus Leas regarding patient's message. Provider was able to contact patient. Patient states that her medications are fine but she is considering a therapist to help improve upon her current symptoms. Provider informed patient that she would be set up with a counselor following the conclusion of the encounter.  Patient to be set up with [facility] therapist for counseling.

## 2021-11-22 ENCOUNTER — Other Ambulatory Visit: Payer: Self-pay

## 2021-11-22 ENCOUNTER — Ambulatory Visit (HOSPITAL_COMMUNITY)
Admission: EM | Admit: 2021-11-22 | Discharge: 2021-11-22 | Disposition: A | Discharge status: Home / Self Care | Payer: 59 | Attending: Physician Assistant | Admitting: Physician Assistant

## 2021-11-22 ENCOUNTER — Encounter (HOSPITAL_COMMUNITY): Payer: Self-pay

## 2021-11-22 ENCOUNTER — Ambulatory Visit (HOSPITAL_COMMUNITY): Payer: Self-pay

## 2021-11-22 DIAGNOSIS — J029 Acute pharyngitis, unspecified: Secondary | ICD-10-CM

## 2021-11-22 DIAGNOSIS — R52 Pain, unspecified: Secondary | ICD-10-CM

## 2021-11-22 DIAGNOSIS — J101 Influenza due to other identified influenza virus with other respiratory manifestations: Secondary | ICD-10-CM

## 2021-11-22 LAB — POC INFLUENZA A AND B ANTIGEN (URGENT CARE ONLY)
INFLUENZA A ANTIGEN, POC: POSITIVE — AB
INFLUENZA B ANTIGEN, POC: NEGATIVE

## 2021-11-22 MED ORDER — VALACYCLOVIR HCL 1 G PO TABS: 1000.0000 mg | ORAL_TABLET | Freq: Two times a day (BID) | ORAL | 0 refills | Status: AC

## 2021-11-22 MED ORDER — OSELTAMIVIR PHOSPHATE 75 MG PO CAPS: 75.0000 mg | ORAL_CAPSULE | Freq: Two times a day (BID) | ORAL | 0 refills | Status: DC

## 2021-11-22 NOTE — Discharge Instructions (Signed)
You tested positive for influenza A.  Please start Tamiflu twice daily for 5 days.  Alternate Tylenol and ibuprofen for fever and pain.  Make sure you are drinking plenty of fluid.  Gargle with warm salt water for sore throat.  You can return to work once you have been fever free and symptoms have improved for 24 hours without medication use.  If you develop any severe symptoms including nausea/vomiting interfering with oral intake, fever not responding to medication, chest pain, shortness of breath, weakness you need to be seen immediately.  I did call in a refill of Valtrex per your request

## 2021-11-22 NOTE — ED Triage Notes (Signed)
Patient presents to Urgent Care with complaints of sore throat and body aches since yesterday. Treating symptoms with ibuprofen.   Denies fever.

## 2021-11-22 NOTE — ED Provider Notes (Signed)
[place]    CSN: [id number] Arrival date & time: 11/22/21  0801      History   Chief Complaint Chief Complaint  Patient presents with   Sore Throat   Generalized Body Aches    HPI Karen [name] is a 31 y.o. female.   Patient presents today with a 24-hour history of URI symptoms.  She reports body aches, subjective fever, sore throat.  Denies any chest pain, shortness of breath, nausea, vomiting, diarrhea, cough, congestion.  Does report household sick contacts (boyfriend) with similar symptoms.  She denies any significant past medical history including asthma, allergies, COPD, smoking.  Denies any recent antibiotic use.  She has been using ibuprofen with minimal improvement of symptoms.  She is eating and drinking normally.  She has had COVID-19 vaccine but has not had flu shot.  She did have COVID several years ago.  In addition, patient is requesting refill of Valtrex.  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  She is denying any current symptoms but requesting this be sent to the pharmacy to be used if needed in the future.   Past Medical History:  Diagnosis Date   Anxiety    Bipolar disorder ([place])    Chronic UTI    Depression    Endometriosis    HSV (herpes simplex virus) anogenital infection    PTSD (post-traumatic stress disorder)     Patient Active Problem List   Diagnosis Date Noted   Visual hallucinations 10/05/2020   Chronic post-traumatic stress disorder (PTSD) 09/11/2020   MDD (major depressive disorder), recurrent episode, severe ([place]) 09/10/2020   Bipolar depression ([place]) 11/13/2019   S/P cesarean section 02/17/2018   Bipolar 1 disorder ([place]) 03/10/2015   Chronic depression 03/10/2015   Chronic anxiety 03/10/2015   Recurrent UTI 03/10/2015   Allergy history, drug--Cipro 03/10/2015   Hx of substance abuse--[facility] 03/10/2015   Back pain 03/10/2015     Past Surgical History:  Procedure Laterality Date   CESAREAN SECTION N/A 02/17/2018   Procedure: CESAREAN SECTION;  Surgeon: Sanjuana Kava, MD;  Location: [place];  Service: Obstetrics;  Laterality: N/A;   COLONOSCOPY     DIAGNOSTIC LAPAROSCOPY  11/05/2012   DILATION AND EVACUATION N/A 03/11/2015   Procedure: DILATATION AND EVACUATION;  Surgeon: Waymon Amato, MD;  Location: [facility];  Service: Gynecology;  Laterality: N/A;   DRUG INDUCED ENDOSCOPY     LAPAROSCOPY     LAPAROSCOPY N/A 06/07/2021   Procedure: LAPAROSCOPY OPERATIVE with /EXCISION OF LESIONS and lysis of adhesions;  Surgeon: Waymon Amato, MD;  Location: [place];  Service: Gynecology;  Laterality: N/A;   TONSILLECTOMY  11/05/2013    OB History     Gravida  2   Para  1   Term  1   Preterm      AB  1   Living  1      SAB  1   IAB      Ectopic      Multiple  0   Live Births  1            Home Medications    Prior to Admission medications   Medication Sig Start Date End Date Taking? Authorizing Provider  oseltamivir (TAMIFLU) 75 MG capsule Take 1 capsule (75 mg total) by mouth every 12 (twelve) hours. 11/22/21  Yes Margorie Renner, Derry Skill, PA-C  acetaminophen (TYLENOL) 325  MG tablet Take 2 tablets (650 mg total) by mouth every 6 (six) hours as needed for mild pain or moderate pain. 06/07/21   Waymon Amato, MD  ARIPiprazole (ABILIFY) 5 MG tablet Take 1 tablet (5 mg total) by mouth daily. 10/31/21 10/31/22  Nwoko, Terese Door, PA  buPROPion (WELLBUTRIN XL) 150 MG 24 hr tablet Take 1 tablet (150 mg total) by mouth daily. 10/31/21   Nwoko, Terese Door, PA  gabapentin (NEURONTIN) 300 MG capsule Take 2 capsules (600 mg total) by mouth 3 (three) times daily. 09/06/21   Nwoko, Terese Door, PA  ibuprofen (ADVIL) 800 MG tablet Take 1 tablet (800 mg total) by mouth every 8 (eight) hours as needed for moderate pain, cramping or headache. You may start medication after 1 am tonight (8 hours after toradol injection given in the  hospital). 06/07/21   Waymon Amato, MD  ISOtretinoin (ACCUTANE) 30 MG capsule Take 1 capsule (30 mg total) by mouth daily. 09/18/21   [place]ffield, Ronalee Red, PA-C  lamoTRIgine (LAMICTAL) 100 MG tablet Take 1 tablet (100 mg total) by mouth 2 (two) times daily. 10/31/21   Nwoko, Terese Door, PA  valACYclovir (VALTREX) 1000 MG tablet Take 1 tablet (1,000 mg total) by mouth 2 (two) times daily for 10 days. 11/22/21 12/02/21  Anh Mangano, Derry Skill, PA-C    Family History Family History  Problem Relation Age of Onset   Rheum arthritis Mother    Cancer Father     Social History Social History   Tobacco Use   Smoking status: Former    Packs/day: 1.00    Types: Cigarettes    Quit date: 03/05/2017    Years since quitting: 4.7   Smokeless tobacco: Never  Vaping Use   Vaping Use: Some days   Substances: Nicotine, Flavoring  Substance Use Topics   Alcohol use: Not Currently   Drug use: Not Currently    Types: Heroin, Cocaine, Marijuana    Comment: Clean since Aug 2015     Allergies   Ciprofloxacin   Review of Systems Review of Systems  Constitutional:  Positive for activity change, fatigue and fever. Negative for appetite change.  HENT:  Positive for sore throat. Negative for congestion, sinus pressure and sneezing.   Respiratory:  Negative for cough and shortness of breath.   Cardiovascular:  Negative for chest pain.  Gastrointestinal:  Positive for nausea. Negative for abdominal pain, diarrhea and vomiting.  Musculoskeletal:  Positive for arthralgias and myalgias.  Neurological:  Positive for headaches. Negative for dizziness and light-headedness.    Physical Exam Triage Vital Signs ED Triage Vitals  Enc Vitals Group     BP 11/22/21 0819 92/68     Pulse Rate 11/22/21 0819 100     Resp 11/22/21 0819 16     Temp 11/22/21 0819 99.1 F (37.3 C)     Temp Source 11/22/21 0819 Oral     SpO2 11/22/21 0819 99 %     Weight --      Height --      Head Circumference --      Peak Flow --      Pain  Score 11/22/21 0818 0     Pain Loc --      Pain Edu? --      Excl. in [place]? --    No data found.  Updated Vital Signs BP 92/68 (BP Location: Right Arm)    Pulse 100    Temp 99.1 F (37.3 C) (Oral)    Resp  16    SpO2 99%   Visual Acuity Right Eye Distance:   Left Eye Distance:   Bilateral Distance:    Right Eye Near:   Left Eye Near:    Bilateral Near:     Physical Exam Vitals reviewed.  Constitutional:      General: She is awake. She is not in acute distress.    Appearance: Normal appearance. She is well-developed. She is not ill-appearing.     Comments: Very pleasant female appears stated age in no acute distress sitting comfortably in exam room  HENT:     Head: Normocephalic and atraumatic.     Right Ear: Tympanic membrane, ear canal and external ear normal. Tympanic membrane is not erythematous or bulging.     Left Ear: Tympanic membrane, ear canal and external ear normal. Tympanic membrane is not erythematous or bulging.     Nose:     Right Sinus: No maxillary sinus tenderness or frontal sinus tenderness.     Left Sinus: No maxillary sinus tenderness or frontal sinus tenderness.     Mouth/Throat:     Pharynx: Uvula midline. Posterior oropharyngeal erythema present. No oropharyngeal exudate.  Cardiovascular:     Rate and Rhythm: Normal rate and regular rhythm.     Heart sounds: Normal heart sounds, S1 normal and S2 normal. No murmur heard. Pulmonary:     Effort: Pulmonary effort is normal.     Breath sounds: Normal breath sounds. No wheezing, rhonchi or rales.     Comments: Clear to auscultation bilaterally Psychiatric:        Behavior: Behavior is cooperative.     UC Treatments / Results  Labs (all labs ordered are listed, but only abnormal results are displayed) Labs Reviewed  POC INFLUENZA A AND B ANTIGEN (URGENT CARE ONLY) - Abnormal; Notable for the following components:      Result Value   INFLUENZA A ANTIGEN, POC POSITIVE (*)    All other components  within normal limits    EKG   Radiology No results found.  Procedures Procedures (including critical care time)  Medications Ordered in UC Medications - No data to display  Initial Impression / Assessment and Plan / UC Course  I have reviewed the triage vital signs and the nursing notes.  Pertinent labs & imaging results that were available during my care of the patient were reviewed by me and considered in my medical decision making (see chart for details).     Patient has a positive for influenza A.  She is within 72 hours of symptom onset so we will start Tamiflu.  Recommended she use over-the-counter medications for additional symptom relief including Tylenol ibuprofen for fever and pain.  She is to rest and drink plenty of fluid.  Recommend she gargle with warm salt water for sore throat symptoms.  She was provided work excuse note.  Discussed that if she has any worsening symptoms she needs to be reevaluated.  Strict return precautions given to which she expressed understanding.  Refill Valtrex provided as requested.  Final Clinical Impressions(s) / UC Diagnoses   Final diagnoses:  Influenza A  Body aches  Sore throat     Discharge Instructions      You tested positive for influenza A.  Please start Tamiflu twice daily for 5 days.  Alternate Tylenol and ibuprofen for fever and pain.  Make sure you are drinking plenty of fluid.  Gargle with warm salt water for sore throat.  You can return to  work once you have been fever free and symptoms have improved for 24 hours without medication use.  If you develop any severe symptoms including nausea/vomiting interfering with oral intake, fever not responding to medication, chest pain, shortness of breath, weakness you need to be seen immediately.  I did call in a refill of Valtrex per your request     ED Prescriptions     Medication Sig Dispense Auth. Provider   valACYclovir (VALTREX) 1000 MG tablet Take 1 tablet (1,000 mg  total) by mouth 2 (two) times daily for 10 days. 20 tablet Anihya Tuma K, PA-C   oseltamivir (TAMIFLU) 75 MG capsule Take 1 capsule (75 mg total) by mouth every 12 (twelve) hours. 10 capsule Joushua Dugar, Derry Skill, PA-C      PDMP not reviewed this encounter.   Terrilee Croak, PA-C 11/22/21 3419

## 2021-11-27 ENCOUNTER — Ambulatory Visit: Payer: 59 | Admitting: Dermatology

## 2022-01-02 ENCOUNTER — Encounter (HOSPITAL_COMMUNITY): Payer: Self-pay | Admitting: Physician Assistant

## 2022-01-02 ENCOUNTER — Telehealth (INDEPENDENT_AMBULATORY_CARE_PROVIDER_SITE_OTHER): Payer: No Payment, Other | Admitting: Physician Assistant

## 2022-01-02 DIAGNOSIS — F419 Anxiety disorder, unspecified: Secondary | ICD-10-CM

## 2022-01-02 DIAGNOSIS — F319 Bipolar disorder, unspecified: Secondary | ICD-10-CM

## 2022-01-02 MED ORDER — BUPROPION HCL ER (XL) 150 MG PO TB24: 150.0000 mg | ORAL_TABLET | Freq: Every day | ORAL | 2 refills | Status: DC

## 2022-01-02 MED ORDER — LAMOTRIGINE 100 MG PO TABS: 100.0000 mg | ORAL_TABLET | Freq: Two times a day (BID) | ORAL | 2 refills | Status: DC

## 2022-01-02 MED ORDER — ARIPIPRAZOLE 5 MG PO TABS: 5.0000 mg | ORAL_TABLET | Freq: Every day | ORAL | 2 refills | Status: DC

## 2022-01-02 NOTE — Progress Notes (Signed)
[place] MD/PA/NP OP Progress Note  Virtual Visit via Telephone Note  I connected with Karen [name] on 01/02/22 at  9:00 AM EST by telephone and verified that I am speaking with the correct person using two identifiers.  Location: Patient: School Provider: Clinic   I discussed the limitations, risks, security and privacy concerns of performing an evaluation and management service by telephone and the availability of in person appointments. I also discussed with the patient that there may be a patient responsible charge related to this service. The patient expressed understanding and agreed to proceed.  Follow Up Instructions:  I discussed the assessment and treatment plan with the patient. The patient was provided an opportunity to ask questions and all were answered. The patient agreed with the plan and demonstrated an understanding of the instructions.   The patient was advised to call back or seek an in-person evaluation if the symptoms worsen or if the condition fails to improve as anticipated.  I provided 9 minutes of non-face-to-face time during this encounter.  [name], PA    01/02/2022 9:18 AM Ashelyn Mccravy  MRN:  [id number]  Chief Complaint:  Chief Complaint  Patient presents with   Follow-up   Medication Management   HPI:   Viridiana [name] is a 31 year old female with a past psychiatric history significant for chronic PTSD, bipolar disorder, and chronic anxiety who presents to [facility] via virtual telephone visit for follow-up of medication management.  Patient is currently being managed on the following medications:  Bupropion (Wellbutrin XL) 150 mg 24-hour tablet daily Lamotrigine 100 mg 2 times daily Abilify 5 mg daily Gabapentin 600 mg 3 times daily  Patient reports no issues or concerns regarding her current medication regimen.  Patient denies experiencing any depressive symptoms or major shifts.  She  reports that her anxiety has been okay.  She reports that she used to take one gabapentin a day as needed for her anxiety since she has started school, patient states that she has been taking 1 to 2 tablets a day.  Patient attributes her anxiety to school and testing performance.  Patient notes that she has also been experiencing fatigue due to her current work load.  Patient is currently pending school for EMS.  Patient is requesting to be set up with a therapist following the conclusion of the encounter.  A GAD-7 screen was performed with the patient scoring a 10.  Patient is alert and oriented x4, calm, cooperative, and fully engaged in conversation during the encounter.  Patient describes her mood as being stressed out.  Patient denies suicidal or homicidal ideations.  She further denies auditory or visual hallucinations and does not appear to be responding to internal/external stimuli.  Patient endorses good sleep and receives on average 8 hours of sleep each night.  Patient endorses good appetite and eats on average 2 meals per day.  Patient denies alcohol consumption, tobacco use, and illicit drug use.  Visit Diagnosis:    ICD-10-CM   1. Chronic anxiety  F41.9     2. Bipolar 1 disorder (HCC)  F31.9 ARIPiprazole (ABILIFY) 5 MG tablet    buPROPion (WELLBUTRIN XL) 150 MG 24 hr tablet    lamoTRIgine (LAMICTAL) 100 MG tablet      Past Psychiatric History:  Bipolar disorder Generalized anxiety disorder Visual hallucinations Chronic PTSD  Past Medical History:  Past Medical History:  Diagnosis Date   Anxiety    Bipolar disorder ([place])  Chronic UTI    Depression    Endometriosis    HSV (herpes simplex virus) anogenital infection    PTSD (post-traumatic stress disorder)     Past Surgical History:  Procedure Laterality Date   CESAREAN SECTION N/A 02/17/2018   Procedure: CESAREAN SECTION;  Surgeon: Sanjuana Kava, MD;  Location: [place];  Service: Obstetrics;  Laterality: N/A;    COLONOSCOPY     DIAGNOSTIC LAPAROSCOPY  11/05/2012   DILATION AND EVACUATION N/A 03/11/2015   Procedure: DILATATION AND EVACUATION;  Surgeon: Waymon Amato, MD;  Location: [place] [facility];  Service: Gynecology;  Laterality: N/A;   DRUG INDUCED ENDOSCOPY     LAPAROSCOPY     LAPAROSCOPY N/A 06/07/2021   Procedure: LAPAROSCOPY OPERATIVE with /EXCISION OF LESIONS and lysis of adhesions;  Surgeon: Waymon Amato, MD;  Location: [place];  Service: Gynecology;  Laterality: N/A;   TONSILLECTOMY  11/05/2013    Family Psychiatric History:  Mother - Major Depressive Disorder Sister - Obsessive Compulsive Disorder Aunt (maternal) - Bipolar I Disorder Uncle (maternal) - Bipolar I Disorder  Family History:  Family History  Problem Relation Age of Onset   Rheum arthritis Mother    Cancer Father     Social History:  Social History   Socioeconomic History   Marital status: Married    Spouse name: Syrenity Klepacki   Number of children: 1   Years of education: Not on file   Highest education level: Some college, no degree  Occupational History   Not on file  Tobacco Use   Smoking status: Former    Packs/day: 1.00    Types: Cigarettes    Quit date: 03/05/2017    Years since quitting: 4.8   Smokeless tobacco: Never  Vaping Use   Vaping Use: Some days   Substances: Nicotine, Flavoring  Substance and Sexual Activity   Alcohol use: Not Currently   Drug use: Not Currently    Types: Heroin, Cocaine, Marijuana    Comment: Clean since Aug 2015   Sexual activity: Not Currently  Other Topics Concern   Not on file  Social History Narrative   Not on file   Social Determinants of Health   Financial Resource Strain: Not on file  Food Insecurity: Not on file  Transportation Needs: Not on file  Physical Activity: Not on file  Stress: Not on file  Social Connections: Not on file    Allergies:  Allergies  Allergen Reactions   Ciprofloxacin Hives    Metabolic Disorder Labs: Lab Results  Component Value  Date   HGBA1C 4.6 (L) 09/11/2020   MPG 85.32 09/11/2020   No results found for: PROLACTIN Lab Results  Component Value Date   CHOL 126 05/09/2021   TRIG 54 05/09/2021   HDL 35 (L) 05/09/2021   CHOLHDL 3.6 05/09/2021   VLDL 8 09/11/2020   LDLCALC 78 05/09/2021   LDLCALC 87 09/11/2020   Lab Results  Component Value Date   TSH 0.784 09/11/2020   TSH 0.68 06/21/2020    Therapeutic Level Labs: No results found for: LITHIUM No results found for: VALPROATE No components found for:  CBMZ  Current Medications: Current Outpatient Medications  Medication Sig Dispense Refill   acetaminophen (TYLENOL) 325 MG tablet Take 2 tablets (650 mg total) by mouth every 6 (six) hours as needed for mild pain or moderate pain. 30 tablet 1   ARIPiprazole (ABILIFY) 5 MG tablet Take 1 tablet (5 mg total) by mouth daily. 30 tablet 2   buPROPion [facility]  XL) 150 MG 24 hr tablet Take 1 tablet (150 mg total) by mouth daily. 30 tablet 2   gabapentin (NEURONTIN) 300 MG capsule Take 2 capsules (600 mg total) by mouth 3 (three) times daily. 180 capsule 1   ibuprofen (ADVIL) 800 MG tablet Take 1 tablet (800 mg total) by mouth every 8 (eight) hours as needed for moderate pain, cramping or headache. You may start medication after 1 am tonight (8 hours after toradol injection given in the hospital). 30 tablet 0   ISOtretinoin (ACCUTANE) 30 MG capsule Take 1 capsule (30 mg total) by mouth daily. 30 capsule 0   lamoTRIgine (LAMICTAL) 100 MG tablet Take 1 tablet (100 mg total) by mouth 2 (two) times daily. 60 tablet 2   oseltamivir (TAMIFLU) 75 MG capsule Take 1 capsule (75 mg total) by mouth every 12 (twelve) hours. 10 capsule 0   No current facility-administered medications for this visit.     Musculoskeletal: Strength & Muscle Tone: Unable to assess due to telemedicine visit [place]: Unable to assess due to telemedicine visit Patient leans: Unable to assess due to telemedicine visit  Psychiatric  Specialty Exam: Review of Systems  There were no vitals taken for this visit.There is no height or weight on file to calculate BMI.  General Appearance: Unable to assess due to telemedicine visit  Eye Contact:  Unable to assess due to telemedicine visit  Speech:  Clear and Coherent and Normal Rate  Volume:  Normal  Mood:  Anxious and Euthymic  Affect:  Appropriate and Congruent  Thought Process:  Coherent and Descriptions of Associations: Intact  Orientation:  Full (Time, Place, and Person)  Thought Content: WDL   Suicidal Thoughts:  No  Homicidal Thoughts:  No  Memory:  Immediate;   Good Recent;   Good Remote;   Good  Judgement:  Good  Insight:  Good  Psychomotor Activity:  Normal  Concentration:  Concentration: Good and Attention Span: Good  Recall:  Good  Fund of Knowledge: Good  Language: Good  Akathisia:  No  Handed:  Ambidextrous  AIMS (if indicated): not done  Assets:  Communication Skills Desire for Improvement Housing Social Support Vocational/Educational  ADL's:  Intact  Cognition: WNL  Sleep:  Good   Screenings: AIMS    Flowsheet Row Admission (Discharged) from OP Visit from 09/10/2020 in [place] [facility] Admission (Discharged) from OP Visit from 11/12/2019 in [place] [facility]  AIMS Total Score 0 0      AUDIT    Flowsheet Row Admission (Discharged) from OP Visit from 09/10/2020 in [place] [facility] Admission (Discharged) from OP Visit from 11/12/2019 in [place] [facility]  Alcohol Use Disorder Identification Test Final Score (AUDIT) 0 0      GAD-7    Flowsheet Row Video Visit from 01/02/2022 in [facility] Video Visit from 10/31/2021 in [facility] Video Visit from 09/06/2021 in [facility] Video Visit from 07/20/2021 in [facility]  Video Visit from 06/01/2021 in [facility]  Total GAD-7 Score 10 5 14 21 14       PHQ2-9    Flowsheet Row Video Visit from 01/02/2022 in [facility] Video Visit from 10/31/2021 in [facility] Video Visit from 09/06/2021 in [facility] Video Visit from 07/20/2021 in [facility]  Center Video Visit from 06/01/2021 in [facility]  PHQ-2 Total Score 2 2 5 5 6   PHQ-9 Total Score 6 9 22 22 23       Flowsheet Row Video Visit from 01/02/2022 in [facility] ED from 11/22/2021 in [facility] Video Visit from 10/31/2021 in [place] No Risk No Risk Moderate Risk        Assessment and Plan:   Jessenya Berdan is a 31 year old female with a past psychiatric history significant for chronic PTSD, bipolar disorder, and chronic anxiety who presents to [facility] via virtual telephone visit for follow-up of medication management.  Patient denies experiencing major depressive episodes or any changes in her mood.  She does report anxiety attributed to currently being in school but states that she is able to manage through the use of her gabapentin.  Patient denies the need for dosage adjustments at this time and is requesting refills on her medications following the conclusion of the encounter.  Patient's medications to be e-prescribed to pharmacy of choice.  Collaboration of Care: Collaboration of Care: Medication Management AEB provider managing patient's psychiatric medications, Psychiatrist AEB patient being followed by a mental health provider, and Referral or follow-up with counselor/therapist AEB provider to place patient with a therapy following the conclusion of the encounter  Patient/Guardian was  advised Release of Information must be obtained prior to any record release in order to collaborate their care with an outside provider. Patient/Guardian was advised if they have not already done so to contact the registration department to sign all necessary forms in order for [place] to release information regarding their care.   Consent: Patient/Guardian gives verbal consent for treatment and assignment of benefits for services provided during this visit. Patient/Guardian expressed understanding and agreed to proceed.   1. Chronic anxiety Patient to continue taking gabapentin 600 mg 3 times daily for the management of her chronic anxiety  2. Bipolar 1 disorder (HCC)  - ARIPiprazole (ABILIFY) 5 MG tablet; Take 1 tablet (5 mg total) by mouth daily.  Dispense: 30 tablet; Refill: 2 - buPROPion (WELLBUTRIN XL) 150 MG 24 hr tablet; Take 1 tablet (150 mg total) by mouth daily.  Dispense: 30 tablet; Refill: 2 - lamoTRIgine (LAMICTAL) 100 MG tablet; Take 1 tablet (100 mg total) by mouth 2 (two) times daily.  Dispense: 60 tablet; Refill: 2  Patient to follow up in 2 months Provider spent a total of 9 minutes with the patient/reviewing patient's chart  [name], PA 01/02/2022, 9:18 AM

## 2022-01-29 ENCOUNTER — Encounter (HOSPITAL_COMMUNITY): Payer: Self-pay

## 2022-01-29 ENCOUNTER — Other Ambulatory Visit: Payer: Self-pay

## 2022-01-29 ENCOUNTER — Ambulatory Visit (HOSPITAL_COMMUNITY)
Admission: RE | Admit: 2022-01-29 | Discharge: 2022-01-29 | Disposition: A | Discharge status: Home / Self Care | Payer: Self-pay | Source: Ambulatory Visit | Attending: Sports Medicine | Admitting: Sports Medicine

## 2022-01-29 VITALS — BP 97/63 | HR 72 | Temp 98.3°F | Resp 14

## 2022-01-29 DIAGNOSIS — J029 Acute pharyngitis, unspecified: Secondary | ICD-10-CM | POA: Insufficient documentation

## 2022-01-29 DIAGNOSIS — J302 Other seasonal allergic rhinitis: Secondary | ICD-10-CM | POA: Insufficient documentation

## 2022-01-29 LAB — POCT RAPID STREP A, ED / UC: Streptococcus, Group A Screen (Direct): NEGATIVE

## 2022-01-29 NOTE — Discharge Instructions (Addendum)
[place], ? ?Your strep test was negative. This will also be sent off for culture to make sure no other bacteria is causing this. We will call you if positive. ? ?This seems most indicative of irritation for seasonal allergies, likely pollen.  You may use Zyrtec, Claritin or any over-the-counter allergy medications as desired.  Otherwise stay hydrated with water, you may use throat lozenges as well ?

## 2022-01-29 NOTE — ED Provider Notes (Signed)
?[place] ? ? ? ?CSN: [id number] ?Arrival date & time: 01/29/22  1204 ? ? ?  ? ?History   ?Chief Complaint ?Chief Complaint  ?Patient presents with  ? Sore Throat  ? ? ?HPI ?Karen [name] is a 31 y.o. female who presents with sore throat. ? ? ?Sore Throat ?Pertinent negatives include no chest pain, no headaches and no shortness of breath.  ? ?Sore throat x 2 days ?Kinda dry and scratchy  ?No fatigue ?No fever/chills ?Very mild congestion ?Does have allergies, feels like this before  ?Her son got over strep throat about 1 week ago she wants to ensure that she does not have this. ? ?Not been taking any medications for her pain, as it is very mild. ? ?Past Medical History:  ?Diagnosis Date  ? Anxiety   ? Bipolar disorder ([place])   ? Chronic UTI   ? Depression   ? Endometriosis   ? HSV (herpes simplex virus) anogenital infection   ? PTSD (post-traumatic stress disorder)   ? ? ?Patient Active Problem List  ? Diagnosis Date Noted  ? Visual hallucinations 10/05/2020  ? Chronic post-traumatic stress disorder (PTSD) 09/11/2020  ? MDD (major depressive disorder), recurrent episode, severe ([place]) 09/10/2020  ? Bipolar depression ([place]) 11/13/2019  ? S/P cesarean section 02/17/2018  ? Bipolar 1 disorder ([place]) 03/10/2015  ? Chronic depression 03/10/2015  ? Chronic anxiety 03/10/2015  ? Recurrent UTI 03/10/2015  ? Allergy history, drug--Cipro 03/10/2015  ? Hx of substance abuse--staying at [facility] 03/10/2015  ? Back pain 03/10/2015  ? ? ?Past Surgical History:  ?Procedure Laterality Date  ? CESAREAN SECTION N/A 02/17/2018  ? Procedure: CESAREAN SECTION;  Surgeon: Sanjuana Kava, MD;  Location: [place];  Service: Obstetrics;  Laterality: N/A;  ? COLONOSCOPY    ? DIAGNOSTIC LAPAROSCOPY  11/05/2012  ? DILATION AND EVACUATION N/A 03/11/2015  ? Procedure: DILATATION AND EVACUATION;  Surgeon: Waymon Amato, MD;  Location: [place] [facility];  Service: Gynecology;  Laterality: N/A;  ? DRUG INDUCED ENDOSCOPY    ? LAPAROSCOPY     ? LAPAROSCOPY N/A 06/07/2021  ? Procedure: LAPAROSCOPY OPERATIVE with /EXCISION OF LESIONS and lysis of adhesions;  Surgeon: Waymon Amato, MD;  Location: [place];  Service: Gynecology;  Laterality: N/A;  ? TONSILLECTOMY  11/05/2013  ? ? ?OB History   ? ? Gravida  ?2  ? Para  ?1  ? Term  ?1  ? Preterm  ?   ? AB  ?1  ? Living  ?1  ?  ? ? SAB  ?1  ? IAB  ?   ? Ectopic  ?   ? Multiple  ?0  ? Live Births  ?1  ?   ?  ?  ? ? ? ?Home Medications   ? ?Prior to Admission medications   ?Medication Sig Start Date End Date Taking? Authorizing Provider  ?acetaminophen (TYLENOL) 325 MG tablet Take 2 tablets (650 mg total) by mouth every 6 (six) hours as needed for mild pain or moderate pain. 06/07/21   Waymon Amato, MD  ?ARIPiprazole (ABILIFY) 5 MG tablet Take 1 tablet (5 mg total) by mouth daily. 01/02/22 01/02/23  [name], PA  ?buPROPion (WELLBUTRIN XL) 150 MG 24 hr tablet Take 1 tablet (150 mg total) by mouth daily. 01/02/22   Nwoko, Terese Door, PA  ?gabapentin (NEURONTIN) 300 MG capsule Take 2 capsules (600 mg total) by mouth 3 (three) times daily. 09/06/21   [name], PA  ?ibuprofen (  ADVIL) 800 MG tablet Take 1 tablet (800 mg total) by mouth every 8 (eight) hours as needed for moderate pain, cramping or headache. You may start medication after 1 am tonight (8 hours after toradol injection given in the hospital). 06/07/21   Waymon Amato, MD  ?ISOtretinoin (ACCUTANE) 30 MG capsule Take 1 capsule (30 mg total) by mouth daily. ?Patient not taking: Reported on 01/29/2022 09/18/21   Warren Danes, PA-C  ?lamoTRIgine (LAMICTAL) 100 MG tablet Take 1 tablet (100 mg total) by mouth 2 (two) times daily. 01/02/22   Nwoko, Terese Door, PA  ?oseltamivir (TAMIFLU) 75 MG capsule Take 1 capsule (75 mg total) by mouth every 12 (twelve) hours. ?Patient not taking: Reported on 01/29/2022 11/22/21   Raspet, Derry Skill, PA-C  ? ? ?Family History ?Family History  ?Problem Relation Age of Onset  ? Rheum arthritis Mother   ? Cancer Father   ? ? ?Social  History ?Social History  ? ?Tobacco Use  ? Smoking status: Former  ?  Packs/day: 1.00  ?  Types: Cigarettes  ?  Quit date: 03/05/2017  ?  Years since quitting: 4.9  ? Smokeless tobacco: Never  ?Vaping Use  ? Vaping Use: Some days  ? Substances: Nicotine, Flavoring  ?Substance Use Topics  ? Alcohol use: Not Currently  ? Drug use: Not Currently  ?  Types: Heroin, Cocaine, Marijuana  ?  Comment: Clean since Aug 2015  ? ? ? ?Allergies   ?Ciprofloxacin ? ? ?Review of Systems ?Review of Systems  ?Constitutional:  Negative for activity change, chills, fatigue and fever.  ?HENT:  Positive for sneezing and sore throat. Negative for congestion.   ?Respiratory:  Negative for shortness of breath and wheezing.   ?Cardiovascular:  Negative for chest pain.  ?Neurological:  Negative for headaches.  ? ? ?Physical Exam ?Triage Vital Signs ?ED Triage Vitals  ?Enc Vitals Group  ?   BP 01/29/22 1300 97/63  ?   Pulse Rate 01/29/22 1300 72  ?   Resp 01/29/22 1300 14  ?   Temp 01/29/22 1300 98.3 ?F (36.8 ?C)  ?   Temp Source 01/29/22 1300 Oral  ?   SpO2 01/29/22 1300 98 %  ?   Weight --   ?   Height --   ?   Head Circumference --   ?   Peak Flow --   ?   Pain Score 01/29/22 1302 1  ?   Pain Loc --   ?   Pain Edu? --   ?   Excl. in [place]? --   ? ?No data found. ? ?Updated Vital Signs ?BP 97/63 (BP Location: Left Arm)   Pulse 72   Temp 98.3 ?F (36.8 ?C) (Oral)   Resp 14   SpO2 98%  ? ?Physical Exam ?Constitutional:   ?   General: She is not in acute distress. ?   Appearance: Normal appearance. She is not ill-appearing or toxic-appearing.  ?HENT:  ?   Head: Normocephalic and atraumatic.  ?   Right Ear: Ear canal and external ear normal.  ?   Left Ear: Ear canal and external ear normal.  ?   Nose: No congestion or rhinorrhea.  ?   Mouth/Throat:  ?   Mouth: Mucous membranes are moist.  ?   Pharynx: No oropharyngeal exudate or posterior oropharyngeal erythema.  ?   Comments: + posterior cobblestoning ?Eyes:  ?   Conjunctiva/sclera: Conjunctivae  normal.  ?   Pupils: Pupils are equal,  round, and reactive to light.  ?Cardiovascular:  ?   Rate and Rhythm: Normal rate.  ?   Pulses: Normal pulses.  ?Pulmonary:  ?   Effort: No respiratory distress.  ?   Breath sounds: No wheezing, rhonchi or rales.  ?Musculoskeletal:  ?   Cervical back: Normal range of motion.  ?Lymphadenopathy:  ?   Cervical: No cervical adenopathy.  ?Skin: ?   General: Skin is warm.  ?   Capillary Refill: Capillary refill takes less than 2 seconds.  ?Neurological:  ?   General: No focal deficit present.  ?   Mental Status: She is alert.  ?Psychiatric:     ?   Mood and Affect: Mood normal.     ?   Behavior: Behavior normal.     ?   Thought Content: Thought content normal.  ? ? ? ?UC Treatments / Results  ?Labs ?(all labs ordered are listed, but only abnormal results are displayed) ?Labs Reviewed  ?CULTURE, GROUP A STREP [facility])  ?POCT RAPID STREP A, ED / UC  ? ? ?EKG ? ? ?Radiology ?No results found. ? ?Procedures ?Procedures (including critical care time) ? ?Medications Ordered in UC ?Medications - No data to display ? ?Initial Impression / Assessment and Plan / UC Course  ?I have reviewed the triage vital signs and the nursing notes. ? ?Pertinent labs & imaging results that were available during my care of the patient were reviewed by me and considered in my medical decision making (see chart for details). ? ?  ? ?Patient presents with 2 days of mild sore throat.  States symptoms started when she was out side golfing where the pollen was heavy.  Has history of seasonal allergies.  She requested strep throat testing today, as her son had recently been treated and got over strep throat over a week ago.  Her point-of-care strep testing was negative today.  We will send off her throat culture, we will call her if these results are positive.  Otherwise, her physical exam and story seems very convincing for allergic rhinitis.  We will treat this conservatively.  Follow-up as needed. ?Final Clinical  Impressions(s) / UC Diagnoses  ? ?Final diagnoses:  ?Seasonal allergies  ?Sore throat  ? ? ? ?Discharge Instructions   ? ?  ?[place], ? ?Your strep test was negative. This will also be sent off for culture to

## 2022-01-29 NOTE — ED Triage Notes (Signed)
Pt presents with sore throat x 3 days. Positive strep exposure. ?

## 2022-02-01 LAB — CULTURE, GROUP A STREP (THRC)

## 2022-03-06 ENCOUNTER — Other Ambulatory Visit: Payer: Self-pay

## 2022-03-06 ENCOUNTER — Telehealth (INDEPENDENT_AMBULATORY_CARE_PROVIDER_SITE_OTHER): Payer: No Payment, Other | Admitting: Physician Assistant

## 2022-03-06 ENCOUNTER — Encounter (HOSPITAL_COMMUNITY): Payer: Self-pay | Admitting: Physician Assistant

## 2022-03-06 DIAGNOSIS — F419 Anxiety disorder, unspecified: Secondary | ICD-10-CM | POA: Diagnosis not present

## 2022-03-06 DIAGNOSIS — F319 Bipolar disorder, unspecified: Secondary | ICD-10-CM

## 2022-03-06 MED ORDER — BUPROPION HCL ER (XL) 150 MG PO TB24
150.0000 mg | ORAL_TABLET | Freq: Every day | ORAL | 2 refills | Status: DC
  Filled 2022-03-06: qty 30, 30d supply, fill #0
  Filled 2022-04-17: qty 30, 30d supply, fill #1

## 2022-03-06 MED ORDER — GABAPENTIN 300 MG PO CAPS
600.0000 mg | ORAL_CAPSULE | Freq: Three times a day (TID) | ORAL | 1 refills | Status: DC
  Filled 2022-03-06: qty 180, 30d supply, fill #0

## 2022-03-06 MED ORDER — ARIPIPRAZOLE 5 MG PO TABS
5.0000 mg | ORAL_TABLET | Freq: Every day | ORAL | 2 refills | Status: DC
  Filled 2022-03-06: qty 30, 30d supply, fill #0
  Filled 2022-04-17: qty 30, 30d supply, fill #1

## 2022-03-06 MED ORDER — LAMOTRIGINE 100 MG PO TABS
100.0000 mg | ORAL_TABLET | Freq: Two times a day (BID) | ORAL | 2 refills | Status: DC
  Filled 2022-03-06: qty 60, 30d supply, fill #0
  Filled 2022-04-17: qty 60, 30d supply, fill #1

## 2022-03-06 NOTE — Progress Notes (Signed)
BH MD/PA/NP OP Progress Note ? ?Virtual Visit via Video Note ? ?I connected with Karen [name] on 03/06/22 at  9:00 AM EDT by a video enabled telemedicine application and verified that I am speaking with the correct person using two identifiers. ? ?Location: ?Patient: Home ?Provider: Clinic ?  ?I discussed the limitations of evaluation and management by telemedicine and the availability of in person appointments. The patient expressed understanding and agreed to proceed. ? ?Follow Up Instructions: ? ?I discussed the assessment and treatment plan with the patient. The patient was provided an opportunity to ask questions and all were answered. The patient agreed with the plan and demonstrated an understanding of the instructions. ?  ?The patient was advised to call back or seek an in-person evaluation if the symptoms worsen or if the condition fails to improve as anticipated. ? ?I provided 18 minutes of non-face-to-face time during this encounter. ? ?Karen Mood, PA ? ? ?03/06/2022 1:44 PM ?Karen [name]  ?MRN:  [id number] ? ?Chief Complaint:  ?Chief Complaint  ?Patient presents with  ? Follow-up  ? Medication Refill  ? ?HPI:  ? ?Karen [name] is a 31 year old female with a past psychiatric history significant for chronic PTSD, bipolar disorder, and chronic anxiety who presents to [facility] via virtual video visit for follow-up of medication management.  Patient is currently being managed on the following medications: ? ?Bupropion (Wellbutrin XL) 150 mg 24-hour tablet daily ?Lamotrigine 100 mg 2 times daily ?Abilify 5 mg daily ?Gabapentin 600 mg 3 times daily ? ?Patient reports that she has not had insurance since February and has been unable to afford some of her medications due to lack of funds.  Patient decided to continue paying for her gabapentin and her lamotrigine prescriptions due to experiencing [name] instability without those medications in the past.   Patient reports that her [name] has been stable enough without the use of Abilify and Wellbutrin but she does endorse some situational depression.  Patient reports that she recently  going through a manic phase and since coming down from her mania, patient now feels tired and depressed.  Patient admits to never truly feeling balanced and states that there is usually a specific moment that may trigger her into a manic or depressive spell. ? ?Patient reports that her anxiety has been pretty well managed through the use of gabapentin.  She does endorse experiencing road where she thought the bugs were on her as well as in the bed.  Patient is unsure if these hallucinations were due to her anxiety since her anxiety tends to manifest at night.  She states that the next day after the experience, she exhibited normal [name].  Patient reports that she just recently graduated from school and will be starting her new job in a few weeks.  Patient reports that she will receive health insurance once she is fully acclimated into her new position. A PHQ-9 screen was performed with the patient scoring an 11.  A GAD-7 screen was performed with the patient scoring an 8. ? ?Patient is alert and oriented x4, calm, cooperative, and fully engaged in conversation during the encounter.  Patient describes her [name] as being stressed out.  Patient denies suicidal or homicidal ideations.  She further denies auditory or visual hallucinations and does not appear to be responding to internal/external stimuli.  Patient endorses good sleep and receives on average 8 hours of sleep each night.  Patient endorses good appetite and  eats on average 2 meals per day.  Patient denies alcohol consumption, tobacco use, and illicit drug use. ? ?Visit Diagnosis:  ?  ICD-10-CM   ?1. Chronic anxiety  F41.9 gabapentin (NEURONTIN) 300 MG capsule  ?  ?2. Bipolar 1 disorder (HCC)  F31.9 ARIPiprazole (ABILIFY) 5 MG tablet  ?  buPROPion (WELLBUTRIN XL) 150 MG 24 hr tablet   ?  lamoTRIgine (LAMICTAL) 100 MG tablet  ?  ? ? ?Past Psychiatric History:  ?Bipolar disorder ?Generalized anxiety disorder ?Visual hallucinations ?Chronic PTSD ? ?Past Medical History:  ?Past Medical History:  ?Diagnosis Date  ? Anxiety   ? Bipolar disorder ([place])   ? Chronic UTI   ? Depression   ? Endometriosis   ? HSV (herpes simplex virus) anogenital infection   ? PTSD (post-traumatic stress disorder)   ?  ?Past Surgical History:  ?Procedure Laterality Date  ? CESAREAN SECTION N/A 02/17/2018  ? Procedure: CESAREAN SECTION;  Surgeon: Sanjuana Kava, MD;  Location: [facility];  Service: Obstetrics;  Laterality: N/A;  ? COLONOSCOPY    ? DIAGNOSTIC LAPAROSCOPY  11/05/2012  ? DILATION AND EVACUATION N/A 03/11/2015  ? Procedure: DILATATION AND EVACUATION;  Surgeon: Waymon Amato, MD;  Location: [facility];  Service: Gynecology;  Laterality: N/A;  ? DRUG INDUCED ENDOSCOPY    ? LAPAROSCOPY    ? LAPAROSCOPY N/A 06/07/2021  ? Procedure: LAPAROSCOPY OPERATIVE with /EXCISION OF LESIONS and lysis of adhesions;  Surgeon: Waymon Amato, MD;  Location: [facility];  Service: Gynecology;  Laterality: N/A;  ? TONSILLECTOMY  11/05/2013  ? ? ?Family Psychiatric History:  ?Mother - Major Depressive Disorder ?Sister - Obsessive Compulsive Disorder ?Aunt (maternal) - Bipolar I Disorder ?Uncle (maternal) - Bipolar I Disorder ? ?Family History:  ?Family History  ?Problem Relation Age of Onset  ? Rheum arthritis Mother   ? Cancer Father   ? ? ?Social History:  ?Social History  ? ?Socioeconomic History  ? Marital status: Married  ?  Spouse name: Adrianne Shackleton  ? Number of children: 1  ? Years of education: Not on file  ? Highest education level: Some college, no degree  ?Occupational History  ? Not on file  ?Tobacco Use  ? Smoking status: Former  ?  Packs/day: 1.00  ?  Types: Cigarettes  ?  Quit date: 03/05/2017  ?  Years since quitting: 5.0  ? Smokeless tobacco: Never  ?Vaping Use  ? Vaping Use: Some days  ? Substances: Nicotine, Flavoring  ?Substance and  Sexual Activity  ? Alcohol use: Not Currently  ? Drug use: Not Currently  ?  Types: Heroin, Cocaine, Marijuana  ?  Comment: Clean since Aug 2015  ? Sexual activity: Not Currently  ?Other Topics Concern  ? Not on file  ?Social History Narrative  ? Not on file  ? ?Social Determinants of Health  ? ?Financial Resource Strain: Not on file  ?Food Insecurity: Not on file  ?Transportation Needs: Not on file  ?Physical Activity: Not on file  ?Stress: Not on file  ?Social Connections: Not on file  ? ? ?Allergies:  ?Allergies  ?Allergen Reactions  ? Ciprofloxacin Hives  ? ? ?Metabolic Disorder Labs: ?Lab Results  ?Component Value Date  ? HGBA1C 4.6 (L) 09/11/2020  ? MPG 85.32 09/11/2020  ? ?No results found for: PROLACTIN ?Lab Results  ?Component Value Date  ? CHOL 126 05/09/2021  ? TRIG 54 05/09/2021  ? HDL 35 (L) 05/09/2021  ? CHOLHDL 3.6 05/09/2021  ? VLDL 8 09/11/2020  ?  [place] 78 05/09/2021  ? [place] 87 09/11/2020  ? ?Lab Results  ?Component Value Date  ? TSH 0.784 09/11/2020  ? TSH 0.68 06/21/2020  ? ? ?Therapeutic Level Labs: ?No results found for: LITHIUM ?No results found for: VALPROATE ?No components found for:  CBMZ ? ?Current Medications: ?Current Outpatient Medications  ?Medication Sig Dispense Refill  ? acetaminophen (TYLENOL) 325 MG tablet Take 2 tablets (650 mg total) by mouth every 6 (six) hours as needed for mild pain or moderate pain. 30 tablet 1  ? ARIPiprazole (ABILIFY) 5 MG tablet Take 1 tablet (5 mg total) by mouth daily. 30 tablet 2  ? buPROPion (WELLBUTRIN XL) 150 MG 24 hr tablet Take 1 tablet (150 mg total) by mouth daily. 30 tablet 2  ? gabapentin (NEURONTIN) 300 MG capsule Take 2 capsules (600 mg total) by mouth 3 (three) times daily. 180 capsule 1  ? ibuprofen (ADVIL) 800 MG tablet Take 1 tablet (800 mg total) by mouth every 8 (eight) hours as needed for moderate pain, cramping or headache. You may start medication after 1 am tonight (8 hours after toradol injection given in the hospital). 30  tablet 0  ? ISOtretinoin (ACCUTANE) 30 MG capsule Take 1 capsule (30 mg total) by mouth daily. (Patient not taking: Reported on 01/29/2022) 30 capsule 0  ? lamoTRIgine (LAMICTAL) 100 MG tablet Take 1 tabl

## 2022-03-07 ENCOUNTER — Encounter (HOSPITAL_COMMUNITY): Payer: Self-pay | Admitting: Licensed Clinical Social Worker

## 2022-03-07 ENCOUNTER — Ambulatory Visit (INDEPENDENT_AMBULATORY_CARE_PROVIDER_SITE_OTHER): Payer: No Payment, Other | Admitting: Licensed Clinical Social Worker

## 2022-03-07 ENCOUNTER — Encounter (HOSPITAL_COMMUNITY): Payer: Self-pay

## 2022-03-07 DIAGNOSIS — F32A Depression, unspecified: Secondary | ICD-10-CM

## 2022-03-07 DIAGNOSIS — F319 Bipolar disorder, unspecified: Secondary | ICD-10-CM | POA: Diagnosis not present
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
## 2022-03-07 NOTE — Plan of Care (Signed)
Pt agreeable to plan  ?

## 2022-03-12 ENCOUNTER — Other Ambulatory Visit: Payer: Self-pay

## 2022-03-14 ENCOUNTER — Ambulatory Visit (INDEPENDENT_AMBULATORY_CARE_PROVIDER_SITE_OTHER): Payer: No Payment, Other | Admitting: Licensed Clinical Social Worker

## 2022-03-14 DIAGNOSIS — F319 Bipolar disorder, unspecified: Secondary | ICD-10-CM

## 2022-03-14 NOTE — Plan of Care (Signed)
?  Problem: Bipolar Disorder CCP Problem  1 bipolar 1 disorder  ?Goal: STG: [place] WILL IDENTIFY COGNITIVE PATTERNS AND BELIEFS THAT INTERFERE WITH THERAPY ?Outcome: Progressing ?  ?Problem: Bipolar Disorder CCP Problem  1 bipolar 1 disorder  ?Goal: maintain 40 hour a week employment  ?Outcome: Not Progressing ?Goal: LTG: Stabilize mood and increase goal-directed behavior: decrease PHQ-9 below 10  ?Outcome: Not Progressing ?Goal: STG: [place] WILL COOPERATE WITH A PSYCHIATRIC EVALUATION ON 10/05/2020  AND DURING ALL FOLLOW-UP VISITS ?Outcome: Not Progressing ?Goal:  decrease GAD-7 below 5  ?Outcome: Not Progressing ?  ?

## 2022-03-14 NOTE — Progress Notes (Signed)
? ?  THERAPIST PROGRESS NOTE ? ?Virtual Visit via Video Note ? ?I connected with Karen [name] on 03/14/22 at  3:00 PM EDT by a video enabled telemedicine application and verified that I am speaking with the correct person using two identifiers. ? ?Location: ?[place]  ?Provider: Providers Home ?  ?I discussed the limitations of evaluation and management by telemedicine and the availability of in person appointments. The patient expressed understanding and agreed to proceed. ? ? ? ?  ?I discussed the assessment and treatment plan with the patient. The patient was provided an opportunity to ask questions and all were answered. The patient agreed with the plan and demonstrated an understanding of the instructions. ?  ?The patient was advised to call back or seek an in-person evaluation if the symptoms worsen or if the condition fails to improve as anticipated. ? ?I provided 40 minutes of non-face-to-face time during this encounter. ? ? ?Dory Horn, [name]  ? ?Participation Level: Active ? ?Behavioral Response: CasualAlertAnxious and Depressed ? ?Type of Therapy: Individual Therapy ? ?Treatment Goals addressed: WILL IDENTIFY COGNITIVE PATTERNS AND BELIEFS THAT INTERFERE WITH  ?THERAPY ? ? ?ProgressTowards Goals: Progressing ? ?Interventions: CBT and Motivational Interviewing ? ?Summary: Karen [name] is a 31 y.o. female who presents with anxious and depressed mood\affect.  Patient was pleasant, cooperative, maintained good eye contact.  She engaged well in therapy session was dressed casually. ? Patient reports in today's session that everything has been going relatively "well".  Patient states that she had 1 depressive episode since [name] and patient met last week.  Patient reports 2 days where she was unable to get out of bed due to isolation, worthlessness, and hopelessness.  Patient reports self-awareness that she was coming down from a hypomanic state.  Patient also notes that she was  not taking all her medications.  Patient reports advocating for herself by reaching out to [facility] for further interventions.  Patient reports that she talk to medication provider who referred [facility] for more inexpensive medications.  Patient reports since taking medications she feels "more stable". ? ?Suicidal/Homicidal: Nowithout intent/plan ? ?Therapist Response:  ? ? Intervention/Plan: [name] utilized CBT to educate patient on positive affirmations, medication management, and diagnostic criteria for bipolar disorder.  CSW utilized supportive therapy for praise and encouragement.  [name] spoke with patient on how positive affirmations could be used on a daily basis to help increase mood.  [name] also spoke with patient about self reflection such as asking the questions at the end of their day "what is 1 good thing, what is 1 bad thing, and what is 1 thing I can improve on for tomorrow".  Plan for patient for next session will be to administer PHQ-9 and GAD-7. ? ? ?Plan: Return again in 4 weeks. ? ? ?Diagnosis: Bipolar 1 disorder ([place]) ? ?Collaboration of Care: Other None in todays session ? ?Patient/Guardian was advised Release of Information must be obtained prior to any record release in order to collaborate their care with an outside provider. Patient/Guardian was advised if they have not already done so to contact the registration department to sign all necessary forms in order for [place] to release information regarding their care.  ? ?Consent: Patient/Guardian gives verbal consent for treatment and assignment of benefits for services provided during this visit. Patient/Guardian expressed understanding and agreed to proceed.  ? ?Dory Horn, [name] ?03/14/2022 ? ?

## 2022-03-19 IMAGING — US US TRANSVAGINAL NON-OB
1 series · 13 of 25 positions shown · non-contrast
Comparison: July 16, 2017.

CLINICAL DATA: Severe pelvic pain.

EXAM:
TRANSABDOMINAL AND TRANSVAGINAL ULTRASOUND OF PELVIS
DOPPLER ULTRASOUND OF OVARIES
TECHNIQUE: Both transabdominal and transvaginal ultrasound examinations of the
pelvis were performed. Transabdominal technique was performed for
global imaging of the pelvis including uterus, ovaries, adnexal
regions, and pelvic cul-de-sac.
It was necessary to proceed with endovaginal exam following the
transabdominal exam to visualize the endometrium and ovaries. Color
and duplex Doppler ultrasound was utilized to evaluate blood flow to
the ovaries.

[Series 1: us transvaginal non-ob · 13 of 165 slices shown]
[im 1/165]
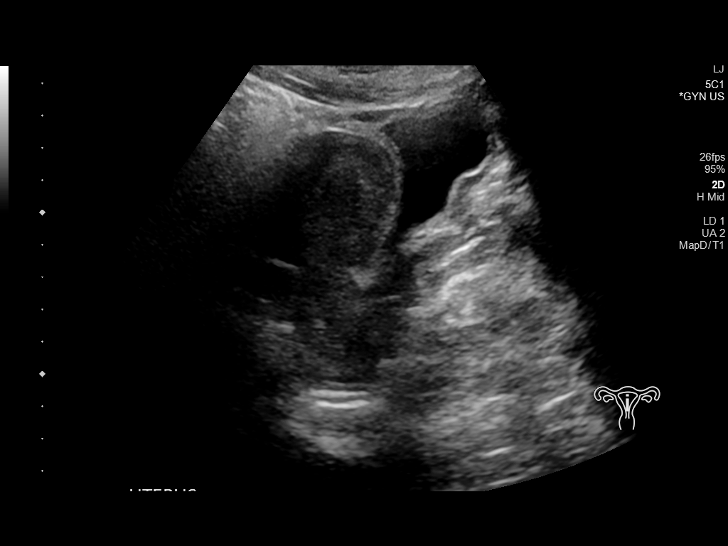
[im 14/165]
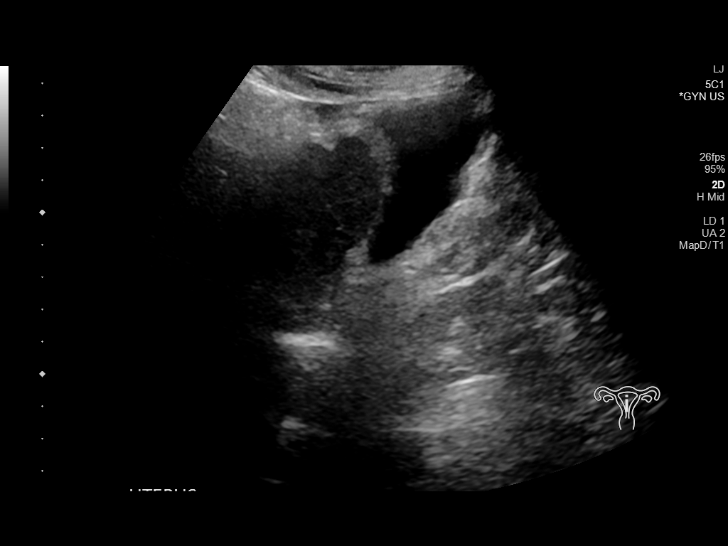
[im 28/165]
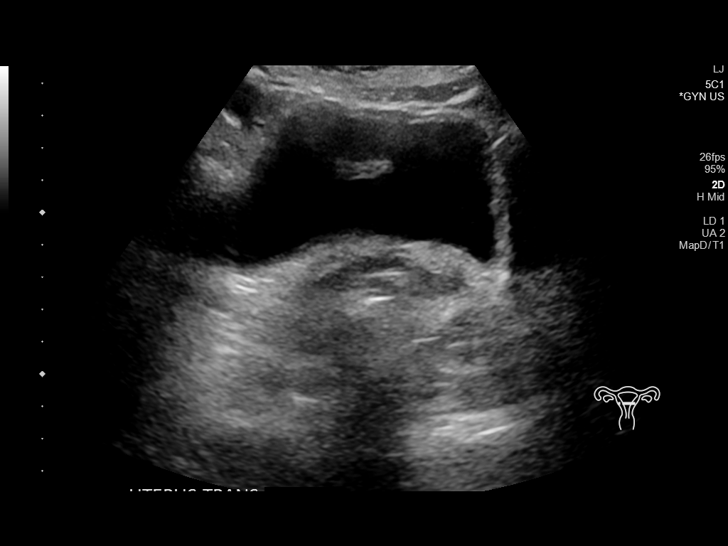
[im 42/165]
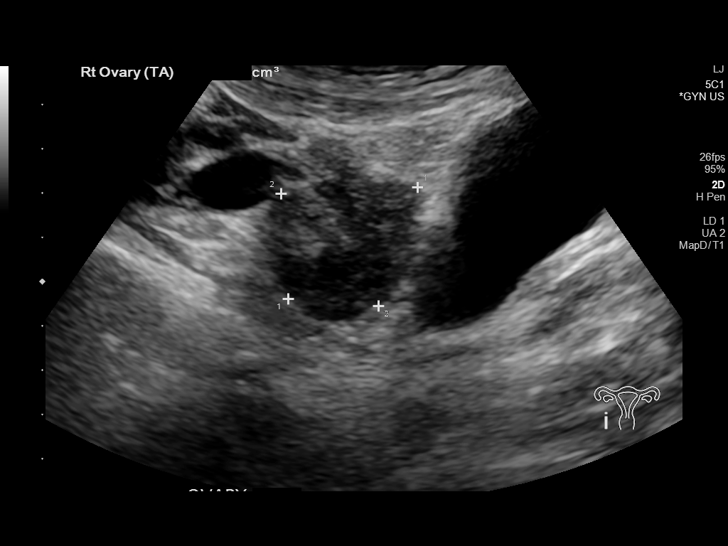
[im 55/165]
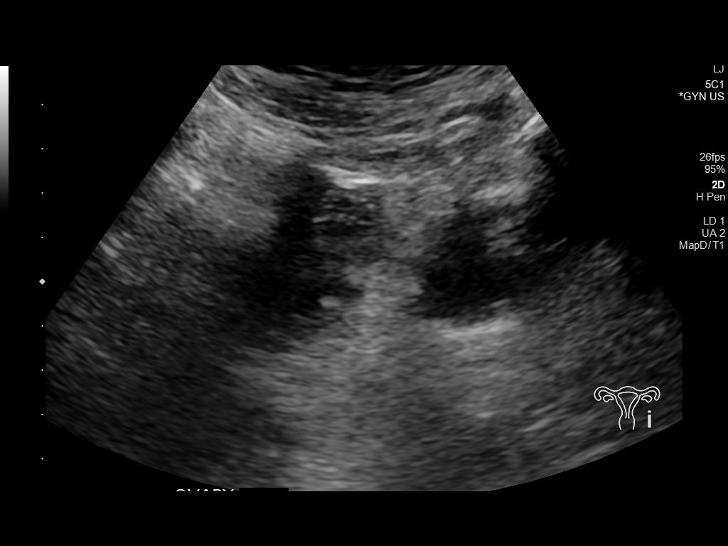
[im 69/165]
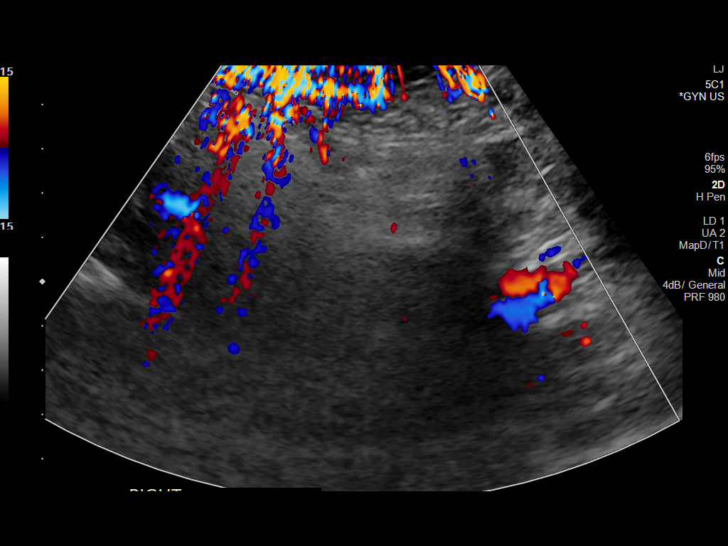
[im 83/165]
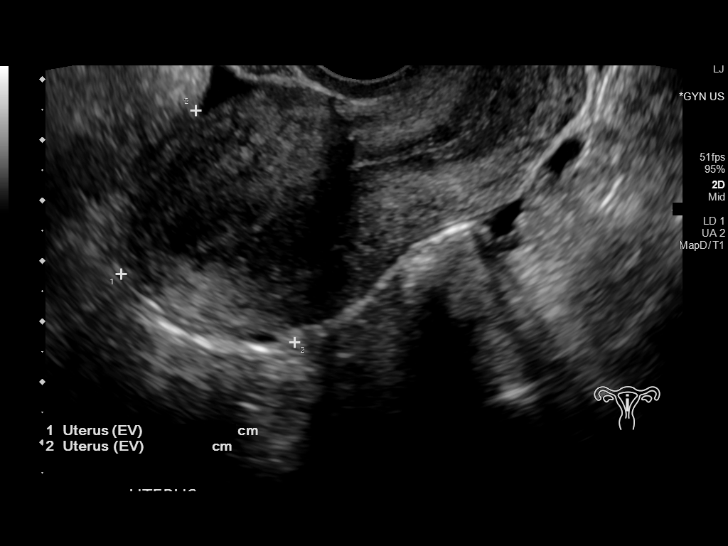
[im 96/165]
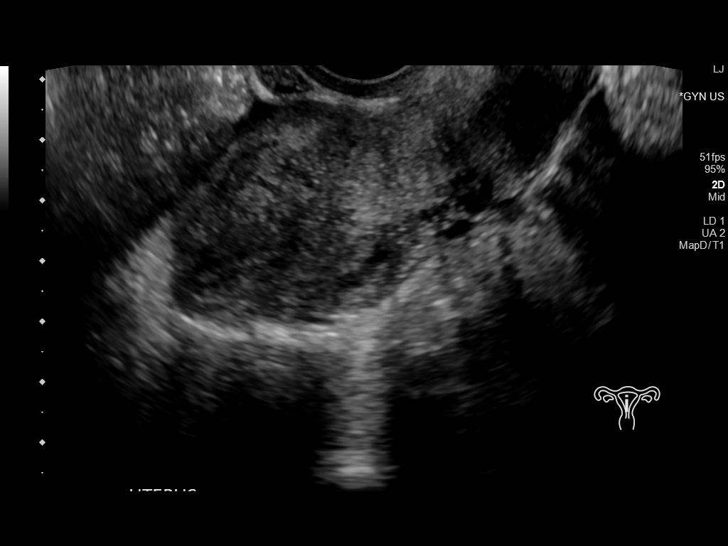
[im 110/165]
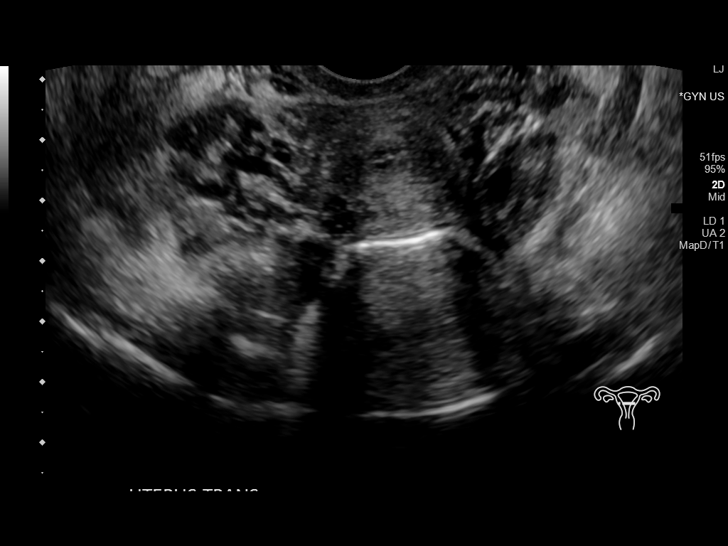
[im 124/165]
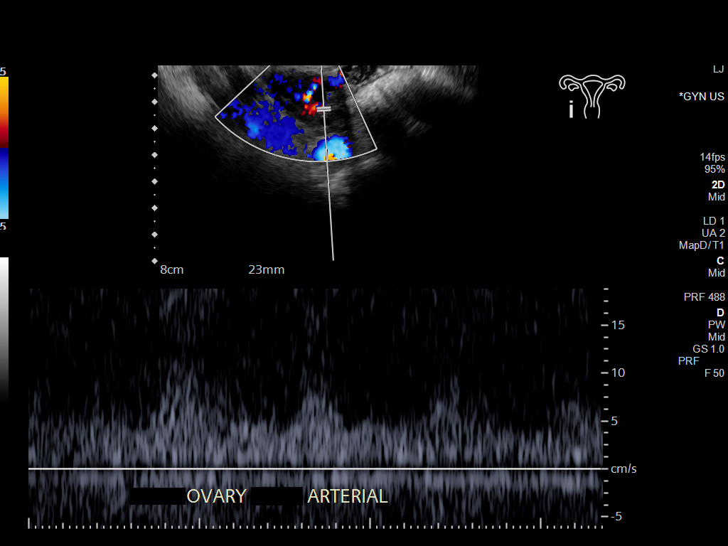
[im 137/165]
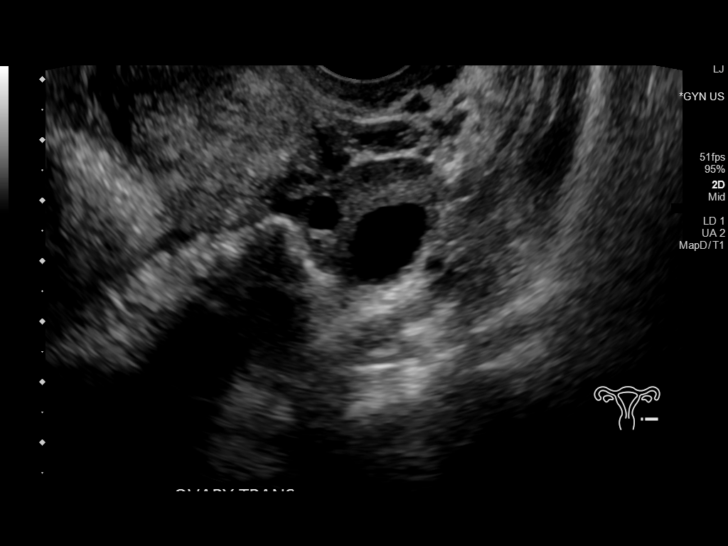
[im 151/165]
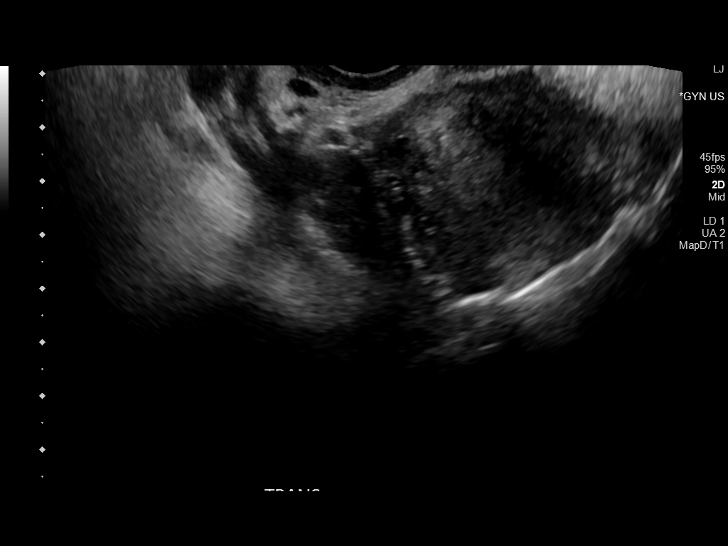
[im 165/165]
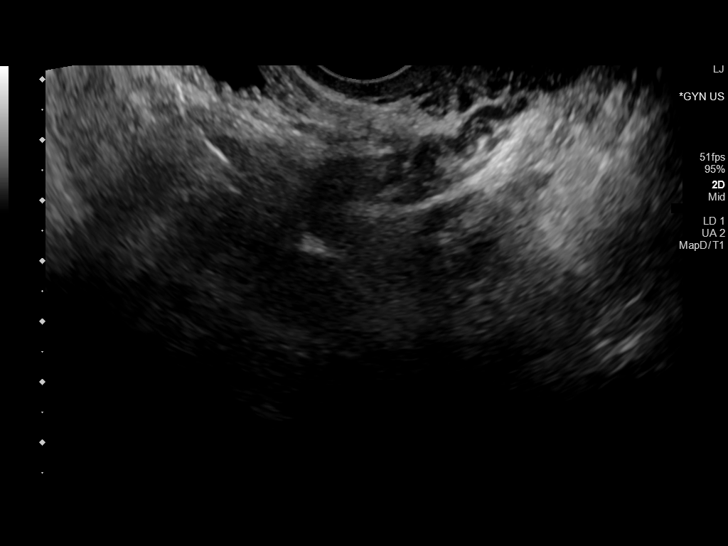

[13 of 25 positions shown; findings below may reference images not displayed]

FINDINGS: Uterus

Measurements: 8.6 x 4.5 x 4.2 cm = volume: 84 mL. 2.5 cm fibroid is
noted anteriorly.

Endometrium

Thickness: 5 mm which is within normal limits. No focal abnormality
visualized.

Right ovary

Measurements: 3.1 x 3.0 x 2.1 cm = volume: 12 mL. Normal
appearance/no adnexal mass.

Left ovary

Measurements: 4.0 x 3.1 x 2.6 cm = volume: 17 mL. 2.1 cm follicle is
noted.

Pulsed Doppler evaluation of both ovaries demonstrates normal
low-resistance arterial and venous waveforms.

Other findings

Trace free fluid is noted which most likely is physiologic.
IMPRESSION: No evidence of ovarian mass or torsion.

2.5 cm uterine fibroid is noted.

No other significant abnormality seen in the pelvis.

## 2022-03-26 ENCOUNTER — Ambulatory Visit (INDEPENDENT_AMBULATORY_CARE_PROVIDER_SITE_OTHER): Payer: No Payment, Other | Admitting: Licensed Clinical Social Worker

## 2022-03-26 DIAGNOSIS — F32A Depression, unspecified: Secondary | ICD-10-CM

## 2022-03-26 DIAGNOSIS — F319 Bipolar disorder, unspecified: Secondary | ICD-10-CM
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
## 2022-03-26 NOTE — Plan of Care (Signed)
  Problem: Bipolar Disorder CCP Problem  1 bipolar 1 disorder  Goal:  walk 3 x weekly or workout  Outcome: Progressing Goal: maintain 40 hour a week employment  Outcome: Progressing Goal: STG: [place] WILL IDENTIFY COGNITIVE PATTERNS AND BELIEFS THAT INTERFERE WITH THERAPY Outcome: Progressing   Problem: Bipolar Disorder CCP Problem  1 bipolar 1 disorder  Goal: LTG: Stabilize mood and increase goal-directed behavior: decrease PHQ-9 below 10  Outcome: Not Progressing Goal:  decrease GAD-7 below 5  Outcome: Not Progressing   Problem: Bipolar Disorder CCP Problem  1 bipolar 1 disorder  Goal: STG: Notnamed WILL COOPERATE WITH A PSYCHIATRIC EVALUATION ON 10/05/2020  AND DURING ALL FOLLOW-UP VISITS Outcome: Completed/Met   Problem: Bipolar Disorder CCP Problem  1 bipolar 1 disorder  Intervention: ENCOURAGE [place] TO KEEP A LOG OF MEDICATION SIDE EFFECTS, QUESTIONS REGARDING MEDICATION, AND SYMPTOMS   Problem: Bipolar Disorder CCP Problem  1 bipolar 1 disorder  Intervention: INSTRUCT Jenette TO COMMUNICATE EFFECTS OF PRESCRIBED MEDICATIONS

## 2022-04-17 ENCOUNTER — Other Ambulatory Visit: Payer: Self-pay

## 2022-04-19 ENCOUNTER — Other Ambulatory Visit: Payer: Self-pay

## 2022-04-30 ENCOUNTER — Ambulatory Visit (INDEPENDENT_AMBULATORY_CARE_PROVIDER_SITE_OTHER): Payer: No Payment, Other | Admitting: Licensed Clinical Social Worker

## 2022-04-30 DIAGNOSIS — F319 Bipolar disorder, unspecified: Secondary | ICD-10-CM

## 2022-05-18 ENCOUNTER — Encounter (HOSPITAL_COMMUNITY): Payer: Self-pay

## 2022-05-21 ENCOUNTER — Ambulatory Visit (HOSPITAL_COMMUNITY): Payer: No Payment, Other | Admitting: Licensed Clinical Social Worker

## 2022-05-29 ENCOUNTER — Telehealth (HOSPITAL_COMMUNITY): Payer: No Payment, Other | Admitting: Physician Assistant

## 2022-06-11 ENCOUNTER — Ambulatory Visit (INDEPENDENT_AMBULATORY_CARE_PROVIDER_SITE_OTHER): Payer: No Payment, Other | Admitting: Licensed Clinical Social Worker

## 2022-06-11 DIAGNOSIS — F319 Bipolar disorder, unspecified: Secondary | ICD-10-CM | POA: Diagnosis not present

## 2022-06-11 NOTE — Progress Notes (Signed)
   THERAPIST PROGRESS NOTE  Virtual Visit via Video Note  I connected with Karen [name] on 06/11/22 at  1:00 PM EDT by a video enabled telemedicine application and verified that I am speaking with the correct person using two identifiers.  Location: Patient: [facility]  Provider: Providers Home    I discussed the limitations of evaluation and management by telemedicine and the availability of in person appointments. The patient expressed understanding and agreed to proceed.      I discussed the assessment and treatment plan with the patient. The patient was provided an opportunity to ask questions and all were answered. The patient agreed with the plan and demonstrated an understanding of the instructions.   The patient was advised to call back or seek an in-person evaluation if the symptoms worsen or if the condition fails to improve as anticipated.  I provided 45 minutes of non-face-to-face time during this encounter.   Karen Horn, [name]   Participation Level: Active  Behavioral Response: CasualAlertAnxious and Depressed  Type of Therapy: Individual Therapy  Treatment Goals addressed: maintain 40 hour a week employment   ProgressTowards Goals: Progressing  Interventions: CBT, Motivational Interviewing, and Supportive   Suicidal/Homicidal: Nowithout intent/plan  Therapist Response:     Pt was alert and oriented x 5. She was dressed casually and engaged well in therapy session. Pt presented with depressed and anxious mood/affect. She was pleasant, cooperative and maintained good eye contact.   Pt primary stressor is ex relationship and son. Pt reports that her boyfriend of 18 months left without warning. She reports he packed up his stuff and just told her "I have a lot of resentment for things that you cannot control". Pt reports depression for hopelessness, irritability, and change in energy. Pt reports she has even tried to distract herself by going on  dating apps, but she ends up feeling "shallow" because she is not attracted to the guys she has been on dates on with. Other stressors for pt were her son. Pt reports that she has never had to raise her child without a significant other before. Karen [name] states she has got him into therapy which has helped, but she feels overwhelmed at times.   Interventions/Plan: [name] used supportive therapy for praise and encouragement. [name] educated pt about utilizing a planner to help with her forgetfulness. This will also help pt maintain a routine and keep her better organized. [name] spoke with pt about handling her basic needs first before moving up into ore emotional analysis.    Plan: Return again in 3 weeks.  Diagnosis: Bipolar 1 disorder ([place])  Collaboration of Care: Other none today   Patient/Guardian was advised Release of Information must be obtained prior to any record release in order to collaborate their care with an outside provider. Patient/Guardian was advised if they have not already done so to contact the registration department to sign all necessary forms in order for [place] to release information regarding their care.   Consent: Patient/Guardian gives verbal consent for treatment and assignment of benefits for services provided during this visit. Patient/Guardian expressed understanding and agreed to proceed.   Karen Horn, [name] 06/11/2022

## 2022-06-11 NOTE — Plan of Care (Signed)
  Problem: Bipolar Disorder CCP Problem  1 bipolar 1 disorder  Goal: maintain 40 hour a week employment  Outcome: Progressing Goal: STG: Karen [name] WILL IDENTIFY COGNITIVE PATTERNS AND BELIEFS THAT INTERFERE WITH THERAPY Outcome: Progressing   Problem: Bipolar Disorder CCP Problem  1 bipolar 1 disorder  Goal:  walk 3 x weekly or workout  Outcome: Not Progressing Goal: LTG: Stabilize mood and increase goal-directed behavior: decrease PHQ-9 below 10  Outcome: Not Progressing Goal:  decrease GAD-7 below 5  Outcome: Not Progressing

## 2022-07-01 ENCOUNTER — Other Ambulatory Visit: Payer: Self-pay | Admitting: Psychiatry

## 2022-07-01 ENCOUNTER — Inpatient Hospital Stay (HOSPITAL_COMMUNITY)
Admission: AD | Admit: 2022-07-01 | Discharge: 2022-07-06 | DRG: 885 | Disposition: A | Discharge status: Home / Self Care | Payer: 59 | Attending: Psychiatry | Admitting: Psychiatry

## 2022-07-01 ENCOUNTER — Other Ambulatory Visit: Payer: Self-pay

## 2022-07-01 ENCOUNTER — Encounter (HOSPITAL_COMMUNITY): Payer: Self-pay | Admitting: Psychiatry

## 2022-07-01 DIAGNOSIS — F419 Anxiety disorder, unspecified: Secondary | ICD-10-CM | POA: Diagnosis present

## 2022-07-01 DIAGNOSIS — F4310 Post-traumatic stress disorder, unspecified: Secondary | ICD-10-CM | POA: Diagnosis present

## 2022-07-01 DIAGNOSIS — Z751 Person awaiting admission to adequate facility elsewhere: Secondary | ICD-10-CM

## 2022-07-01 DIAGNOSIS — Z79899 Other long term (current) drug therapy: Secondary | ICD-10-CM

## 2022-07-01 DIAGNOSIS — F22 Delusional disorders: Secondary | ICD-10-CM | POA: Diagnosis present

## 2022-07-01 DIAGNOSIS — K5900 Constipation, unspecified: Secondary | ICD-10-CM | POA: Diagnosis present

## 2022-07-01 DIAGNOSIS — F17290 Nicotine dependence, other tobacco product, uncomplicated: Secondary | ICD-10-CM | POA: Diagnosis present

## 2022-07-01 DIAGNOSIS — F1910 Other psychoactive substance abuse, uncomplicated: Secondary | ICD-10-CM | POA: Diagnosis present

## 2022-07-01 DIAGNOSIS — Z8261 Family history of arthritis: Secondary | ICD-10-CM | POA: Diagnosis not present

## 2022-07-01 DIAGNOSIS — R45851 Suicidal ideations: Secondary | ICD-10-CM | POA: Diagnosis present

## 2022-07-01 DIAGNOSIS — F515 Nightmare disorder: Secondary | ICD-10-CM | POA: Diagnosis present

## 2022-07-01 DIAGNOSIS — Z818 Family history of other mental and behavioral disorders: Secondary | ICD-10-CM

## 2022-07-01 DIAGNOSIS — Z888 Allergy status to other drugs, medicaments and biological substances status: Secondary | ICD-10-CM

## 2022-07-01 DIAGNOSIS — F332 Major depressive disorder, recurrent severe without psychotic features: Secondary | ICD-10-CM | POA: Diagnosis present

## 2022-07-01 DIAGNOSIS — Z20822 Contact with and (suspected) exposure to covid-19: Secondary | ICD-10-CM | POA: Diagnosis present

## 2022-07-01 DIAGNOSIS — F319 Bipolar disorder, unspecified: Secondary | ICD-10-CM

## 2022-07-01 LAB — SARS CORONAVIRUS 2 BY RT PCR: SARS Coronavirus 2 by RT PCR: NEGATIVE

## 2022-07-01 MED ORDER — HYDROXYZINE HCL 25 MG PO TABS
25.0000 mg | ORAL_TABLET | Freq: Three times a day (TID) | ORAL | Status: DC | PRN
  Administered 2022-07-05: 25 mg via ORAL
  Filled 2022-07-01: qty 1

## 2022-07-01 MED ORDER — BUPROPION HCL ER (XL) 150 MG PO TB24
150.0000 mg | ORAL_TABLET | Freq: Every day | ORAL | Status: DC
  Administered 2022-07-01: 150 mg via ORAL
  Filled 2022-07-01 (×4): qty 1

## 2022-07-01 MED ORDER — MELATONIN 5 MG PO TABS
5.0000 mg | ORAL_TABLET | Freq: Every day | ORAL | Status: DC
  Administered 2022-07-01: 5 mg via ORAL
  Filled 2022-07-01 (×5): qty 1

## 2022-07-01 MED ORDER — MAGNESIUM HYDROXIDE 400 MG/5ML PO SUSP: 30.0000 mL | Freq: Every day | ORAL | Status: DC | PRN

## 2022-07-01 MED ORDER — ARIPIPRAZOLE 5 MG PO TABS
5.0000 mg | ORAL_TABLET | Freq: Once | ORAL | Status: AC
  Administered 2022-07-01: 5 mg via ORAL
  Filled 2022-07-01 (×2): qty 1

## 2022-07-01 MED ORDER — NICOTINE POLACRILEX 2 MG MT GUM
2.0000 mg | CHEWING_GUM | OROMUCOSAL | Status: DC | PRN
  Administered 2022-07-01: 2 mg via ORAL

## 2022-07-01 MED ORDER — LAMOTRIGINE 100 MG PO TABS
100.0000 mg | ORAL_TABLET | Freq: Every day | ORAL | Status: AC
Start: 2022-07-01 — End: 2022-07-03
  Administered 2022-07-01 – 2022-07-03 (×3): 100 mg via ORAL
  Filled 2022-07-01 (×5): qty 1

## 2022-07-01 MED ORDER — GABAPENTIN 300 MG PO CAPS
600.0000 mg | ORAL_CAPSULE | Freq: Three times a day (TID) | ORAL | Status: DC
  Administered 2022-07-01 – 2022-07-06 (×15): 600 mg via ORAL
  Filled 2022-07-01 (×20): qty 2

## 2022-07-01 MED ORDER — ALUM & MAG HYDROXIDE-SIMETH 200-200-20 MG/5ML PO SUSP: 30.0000 mL | ORAL | Status: DC | PRN

## 2022-07-01 MED ORDER — ARIPIPRAZOLE 5 MG PO TABS
5.0000 mg | ORAL_TABLET | Freq: Every day | ORAL | Status: DC
  Administered 2022-07-01: 5 mg via ORAL
  Filled 2022-07-01 (×4): qty 1

## 2022-07-01 MED ORDER — ARIPIPRAZOLE 10 MG PO TABS
10.0000 mg | ORAL_TABLET | Freq: Every day | ORAL | Status: DC
  Administered 2022-07-02 – 2022-07-06 (×5): 10 mg via ORAL
  Filled 2022-07-01 (×7): qty 1

## 2022-07-01 MED ORDER — LAMOTRIGINE 25 MG PO TABS
50.0000 mg | ORAL_TABLET | Freq: Every day | ORAL | Status: AC
  Administered 2022-07-01 – 2022-07-03 (×3): 50 mg via ORAL
  Filled 2022-07-01 (×5): qty 2

## 2022-07-01 MED ORDER — MIRTAZAPINE 7.5 MG PO TABS
7.5000 mg | ORAL_TABLET | Freq: Every day | ORAL | Status: DC
  Administered 2022-07-01 – 2022-07-05 (×5): 7.5 mg via ORAL
  Filled 2022-07-01 (×8): qty 1

## 2022-07-01 MED ORDER — ACETAMINOPHEN 325 MG PO TABS
650.0000 mg | ORAL_TABLET | Freq: Four times a day (QID) | ORAL | Status: DC | PRN
  Administered 2022-07-01 – 2022-07-04 (×2): 650 mg via ORAL
  Filled 2022-07-01 (×2): qty 2

## 2022-07-01 MED ORDER — NICOTINE POLACRILEX 2 MG MT GUM
4.0000 mg | CHEWING_GUM | OROMUCOSAL | Status: DC
  Administered 2022-07-01 – 2022-07-02 (×4): 4 mg via ORAL
  Administered 2022-07-02: 2 mg via ORAL
  Administered 2022-07-02 – 2022-07-06 (×17): 4 mg via ORAL
  Filled 2022-07-01 (×33): qty 2

## 2022-07-01 NOTE — [facility] Group Notes (Signed)
Psychoeducational Group- Pt did not attend due to meeting with provider.

## 2022-07-01 NOTE — [facility] Group Notes (Signed)
Adult Psychoeducational Group Note Date:  06/24/2022 Time:  [id number] Group Topic/Focus: PROGRESSIVE RELAXATION. A group where deep breathing is taught and tensing and relaxation muscle groups is used. Imagery is used as well.  Pts are asked to imagine 3 pillars that hold them up when they are not able to hold themselves up and to share that with the group.  Participation Level:  did not attend  Paulino Rily

## 2022-07-01 NOTE — Tx Team (Signed)
Initial Treatment Plan 07/01/2022 1:24 PM Karen [name] BPP:[id number]    PATIENT STRESSORS: Loss of father terminally ill in hospice   Medication change or noncompliance     PATIENT STRENGTHS: Ability for insight  Active sense of humor  Average or above average intelligence  Capable of independent living    PATIENT IDENTIFIED PROBLEMS: Depression anxiety                     DISCHARGE CRITERIA:  Improved stabilization in mood, thinking, and/or behavior Motivation to continue treatment in a less acute level of care Need for constant or close observation no longer present Verbal commitment to aftercare and medication compliance  PRELIMINARY DISCHARGE PLAN: Outpatient therapy Return to previous living arrangement  PATIENT/FAMILY INVOLVEMENT: This treatment plan has been presented to and reviewed with the patient, Karen [name], and/or family member,The patient and family have been given the opportunity to ask questions and make suggestions.  Leonia Reader, RN 07/01/2022, 1:24 PM

## 2022-07-01 NOTE — Progress Notes (Signed)
Pt given urine cup and educated on need for urine for pregnancy/UDS.

## 2022-07-01 NOTE — Progress Notes (Signed)
Pt presents with depressed affect and wants to go to bed early. Pt reports "I don't know, I don't like meeting new people". Pt rates depression 10/10 and anxiety 5/10. Pt reports her appetite "sucks", and no physical problems. Pt denies SI/HI/AVH and verbally contracts for safety. Provided support and encouragement. Pt safe on the unit. Q 15 minute safety checks continued.
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
## 2022-07-01 NOTE — Progress Notes (Signed)
UDS and urine pregnancy test collected and placed in fridge for pickup.

## 2022-07-01 NOTE — Progress Notes (Signed)
Adult Psychoeducational Group Note  Date:  07/01/2022 Time:  10:27 PM  Group Topic/Focus:  Wrap-Up Group:   The focus of this group is to help patients review their daily goal of treatment and discuss progress on daily workbooks.  Participation Level:  Minimal  Participation Quality:  Attentive  Affect:  Appropriate  Cognitive:  Appropriate  Insight: Appropriate  Engagement in Group:  Engaged  Modes of Intervention:  Discussion  Additional Comments:    Pt rated her mood/day 2/10. Pt reports today being her first day and she doesn't want to be here. Pt identified her goal as simply getting through treatment. Pt was receptive to the encouragement and support provided by MHT and peers. Pt reports having an overall good day.    Karen [name] 07/01/2022, 10:27 PM
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
## 2022-07-01 NOTE — BHH Suicide Risk Assessment (Signed)
[facility] Admission Suicide Risk Assessment   Nursing information obtained from:  Patient, Review of record Demographic factors:  Living alone, Adolescent or young adult Current Mental Status:  Suicide plan Loss Factors:  Loss of significant relationship Historical Factors:  Prior suicide attempts Risk Reduction Factors:  Positive coping skills or problem solving skills, Sense of responsibility to family, Responsible for children under 54 years of age  Total Time spent with patient: 45 minutes Principal Problem: MDD (major depressive disorder), recurrent episode, severe ([place]) Diagnosis:  Principal Problem:   MDD (major depressive disorder), recurrent episode, severe ([place])   Subjective Data: Karen [name] is a 31 y.o. female with psychiatric hx of type I bipolar disorder, PTSD, substance abuse who presented to [facility] voluntarily with worsening depression and active SI with plan.  Patient was evaluated in day room for intake.  Patient reports severe depression and anxiety that has been ongoing for 2 to 3 months.  Patient has history of bipolar disorder, type I and reports she is regularly manic for approximately 1 month and depressed for 1 week.  However, patient's depressive episode has presently lasted for several months.  Patient describes stressors of recent break-up in July as well as father who is terminally ill with hospice due to brain tumor but describes these not to be explicit triggers for her.  Of note, patient has recently been on Accutane for several months and due to a lapse in insurance and has been off birth control for past 3 months.  Patient describes poor self-care, poor sleep, anhedonia, increased feelings of guilt/worthlessness, low energy, poor concentration, poor appetite, intrusive suicidal thoughts.  Patient suicidal thoughts have been chronic for decades and she expresses incredible frustration relating to being in this "middle position" where she is neither feeling better nor  having completed suicide.  Patient does describe 67-year-old son as a protective factor.  Patient reports having a "complicated" relationship with son because of the fact that she recognizes him as a protective factor from her completing suicide; however, she also is angry that her son is a protective factor for this.  Patient reports that she will normally send son to ex-husband's house during her depressive week as she does not want son to be affected by her mental health problems.  Patient reports currently having chronic suicidal ideation which continues to persist.  Patient denies homicidal ideation/auditory/visual hallucinations.  Patient reports that due to multiple rescheduling with her primary psychiatrist, she has been taking Lamictal at half dose to extend how long she can be on Lamictal as she was fearful of mental side effects.  Patient reports regular compliance with other medications including Abilify 5 mg daily, gabapentin 600 mg 3 times daily, Lamictal 100 mg twice daily, and Wellbutrin 150 mg XL.  Continued Clinical Symptoms:    The "Alcohol Use Disorders Identification Test", Guidelines for Use in Primary Care, Second Edition.  World Pharmacologist [facility]). Score between 0-7:  no or low risk or alcohol related problems. Score between 8-15:  moderate risk of alcohol related problems. Score between 16-19:  high risk of alcohol related problems. Score 20 or above:  warrants further diagnostic evaluation for alcohol dependence and treatment.   CLINICAL FACTORS:   Severe Anxiety and/or Agitation Alcohol/Substance Abuse/Dependencies More than one psychiatric diagnosis Previous Psychiatric Diagnoses and Treatments   Musculoskeletal: Strength & Muscle Tone: within normal limits Gait & Station: normal Patient leans: N/A  Psychiatric Specialty Exam:  Presentation  General Appearance: Appropriate for Environment; Casual  Eye Contact:Good   Speech:Clear and Coherent;  Slow   Speech Volume:Normal   Handedness:No data recorded   Mood and Affect  Mood:Anxious; Depressed; Hopeless   Affect:Appropriate; Depressed; Tearful    Thought Process  Thought Processes:Coherent; Goal Directed; Linear   Descriptions of Associations:Intact   Orientation:Full (Time, Place and Person)   Thought Content:Logical   History of Schizophrenia/Schizoaffective disorder:No  Duration of Psychotic Symptoms:No data recorded Hallucinations:Hallucinations: None   Ideas of Reference:None   Suicidal Thoughts:Suicidal Thoughts: Yes, Passive   Homicidal Thoughts:Homicidal Thoughts: No    Sensorium  Memory:Immediate Good; Recent Good; Remote Good   Judgment:Fair   Insight:Good    Executive Functions  Concentration:Good   Attention Span:Good   [place] of Knowledge:Good   Language:Good    Psychomotor Activity  Psychomotor Activity:Psychomotor Activity: Decreased    Assets  Assets:Desire for Improvement; Financial Resources/Insurance; Housing; Leisure Time; Physical Health; Resilience; Talents/Skills; Transportation; Vocational/Educational    Sleep  Sleep:Sleep: Poor     Physical Exam: Physical Exam Review of Systems  Respiratory:  Negative for shortness of breath.   Cardiovascular:  Negative for chest pain.  Gastrointestinal:  Negative for abdominal pain, constipation, diarrhea, heartburn, nausea and vomiting.  Neurological:  Negative for headaches.   Blood pressure 102/68, pulse 66, temperature 98 F (36.7 C), temperature source Oral, resp. rate 16, height 5' (1.524 m), weight (P) 47.6 kg, SpO2 100 %. Body mass index is 20.51 kg/m (pended).   COGNITIVE FEATURES THAT CONTRIBUTE TO RISK:  Polarized thinking    SUICIDE RISK:   Severe:  Frequent, intense, and enduring suicidal ideation, specific plan, no subjective intent, but some objective markers of intent (i.e., choice of lethal method), the method is  accessible, some limited preparatory behavior, evidence of impaired self-control, severe dysphoria/symptomatology, multiple risk factors present, and few if any protective factors, particularly a lack of social support.  PLAN OF CARE: see H&P  I certify that inpatient services furnished can reasonably be expected to improve the patient's condition.   France Ravens, MD 07/01/2022, 3:32 PM

## 2022-07-01 NOTE — Progress Notes (Signed)
Patient admitted to [facility] voluntarily as a walk in. Patient upon admission is tearful, depressed and with blunted affect. She shares that she has been depressed and that her appointments at the [facility] had been pushed back due to provider schedule and that '' I had been stretching my medications because they kept moving my appoinment back. So I've only taken half of my lamictal for the last four days. '' Pt per record review indicated a plan to use razor blades to cut self and lay in bathtub to bleed out. Patient is able to agree to be safe in the hospital. Reports stress with new promotion at work and that her father is terminally ill with hospice due to a brain tumor.  Pt oriented to unit.Skin searched and no contraband found. Pt denies any HI or A/V Hallucinations. Reports poor po intake and weight loss due to depression but declines ensure.  Pt is safe, will con't to monitor.

## 2022-07-01 NOTE — BHH Suicide Risk Assessment (Signed)
[facility] INPATIENT:  Family/Significant Other Suicide Prevention Education  Suicide Prevention Education:  Patient Refusal for Family/Significant Other Suicide Prevention Education: The patient Karen [name] has refused to provide written consent for family/significant other to be provided Family/Significant Other Suicide Prevention Education during admission and/or prior to discharge.  Physician notified.  Berlin Hun Grossman-Orr 07/01/2022, 2:30 PM

## 2022-07-01 NOTE — H&P (Signed)
Behavioral Health Medical Screening Exam  Karen [name] is an 31 y.o. female with past psychiatric history of bipolar 1, bipolar depression, anxiety, MDD severe, PTSD, and substance abuse presented to [facility] voluntarily for assessment of suicidal ideations and severe depression.   Patient describes "not doing well mental health wise" where she  "unable to function and having to fight not to kill myself". She describes experiencing a recent break-up and her father actively dying as triggers to her downward spiral. As a result she states she has not been taking care of herself; poor sleep, anhedonia, increased feelings of guilt and worthlessness, low energy and concentration, poor appetite and intrusive suicidal thoughts. Recently sent son to live with his father to avoid him witnessing her current state and possibly "finding me".   She is currently engaged in outpatient therapy and medication via [facility] where she reports having been "lying" about her suicidal thoughts and plan. She currently works full-time at a [employer] where she has recently received a promotion. States she came in due to having her plan (razor blades in bath tub) "laid out". She presents calm and cooperative; withdrawn, depressed affect. She endorses active suicidal ideations with intent, plan, and access to multiple means. Denies any homicidal ideation, auditory or visual hallucinations. Provider discussed risk factors, concerns, and plan for admission to [facility] unit; patient in agreement to voluntary admission.   Total Time spent with patient: 20 minutes  Psychiatric Specialty Exam: Physical Exam Vitals and nursing note reviewed.  Constitutional:      Appearance: She is normal weight.  HENT:     Head: Normocephalic.     Nose: Nose normal.     Mouth/Throat:     Mouth: Mucous membranes are moist.     Pharynx: Oropharynx is clear.  Eyes:     Pupils: Pupils are equal, round, and reactive to light.  Cardiovascular:      Rate and Rhythm: Normal rate.     Pulses: Normal pulses.  Pulmonary:     Effort: Pulmonary effort is normal.  Abdominal:     Palpations: Abdomen is soft.  Musculoskeletal:        General: Normal range of motion.     Cervical back: Normal range of motion.  Skin:    General: Skin is warm and dry.  Neurological:     Mental Status: She is alert and oriented to person, place, and time.  Psychiatric:        Attention and Perception: Attention and perception normal.        Mood and Affect: Mood is depressed. Affect is flat.        Speech: Speech is delayed.        Behavior: Behavior is withdrawn. Behavior is cooperative.        Thought Content: Thought content is not paranoid or delusional. Thought content includes suicidal ideation. Thought content does not include homicidal ideation. Thought content includes suicidal plan. Thought content does not include homicidal plan.        Cognition and Memory: Cognition and memory normal.        Judgment: Judgment normal.    Review of Systems  Constitutional:  Positive for activity change, appetite change and unexpected weight change.  Psychiatric/Behavioral:  Positive for decreased concentration, dysphoric mood, sleep disturbance and suicidal ideas. The patient is nervous/anxious.   All other systems reviewed and are negative.  Blood pressure 97/65, pulse 69, temperature 98.7 F (37.1 C), temperature source Oral, resp. rate 16, SpO2  100 %.There is no height or weight on file to calculate BMI. General Appearance: Casual Eye Contact:  Good Speech:  Clear and Coherent Volume:  Normal Mood:  Depressed, Dysphoric, and Hopeless Affect:  Blunt, Congruent, and Depressed Thought Process:  Coherent and Linear Orientation:  Full (Time, Place, and Person) Thought Content:  Logical Suicidal Thoughts:  Yes.  with intent/plan Homicidal Thoughts:  No Memory:  Immediate;   Fair Recent;   Fair Judgement:  Fair Insight:  Present Psychomotor Activity:   Normal Concentration: Concentration: Good and Attention Span: Good Recall:  Good Fund of Knowledge:Good Language: Good Akathisia:  NA Handed:  Right AIMS (if indicated):    Assets:  Communication Skills Desire for Improvement Financial Resources/Insurance Housing Leisure Time [place] Talents/Skills Transportation Vocational/Educational Sleep:     Musculoskeletal: Strength & Muscle Tone: within normal limits Gait & Station: normal Patient leans: N/A  Blood pressure 97/65, pulse 69, temperature 98.7 F (37.1 C), temperature source Oral, resp. rate 16, SpO2 100 %.  [place] Scale:  Health and safety inspector from 03/07/2022 in [facility] Video Visit from 03/06/2022 in [facility] ED from 01/29/2022 in [facility] at [place] Risk No Risk       Recommendations: Based on my evaluation the patient does not appear to have an emergency medical condition. Patient accepted to [facility].   Inda Merlin, NP 07/01/2022, 9:27 AM

## 2022-07-01 NOTE — BHH Group Notes (Signed)
Adult Psychoeducational Group  Date:  06/24/2022 Time:  1300-1400  Group Topic/Focus: Continuation of the group from Saturday. Looking at the lists that were created and talking about what needs to be done with the homework of 30 positives about themselves.                                     Talking about taking their power back and helping themselves to develop a positive self esteem.      Participation Quality: did not attend  Paulino Rily

## 2022-07-02 ENCOUNTER — Encounter (HOSPITAL_COMMUNITY): Payer: Self-pay

## 2022-07-02 DIAGNOSIS — F332 Major depressive disorder, recurrent severe without psychotic features: Secondary | ICD-10-CM | POA: Diagnosis not present

## 2022-07-02 LAB — LIPID PANEL
Cholesterol: 117 mg/dL (ref 0–200)
HDL: 29 mg/dL — ABNORMAL LOW (ref >40)
LDL Cholesterol: 69 mg/dL (ref 0–99)
Total CHOL/HDL Ratio: 4 RATIO
Triglycerides: 94 mg/dL (ref <150)
VLDL: 19 mg/dL (ref 0–40)

## 2022-07-02 LAB — RAPID URINE DRUG SCREEN, HOSP PERFORMED
Amphetamines: NOT DETECTED
Barbiturates: NOT DETECTED
Benzodiazepines: NOT DETECTED
Cocaine: NOT DETECTED
Opiates: NOT DETECTED
Tetrahydrocannabinol: NOT DETECTED

## 2022-07-02 LAB — CBC
HCT: 39.4 % (ref 36.0–46.0)
Hemoglobin: 13.5 g/dL (ref 12.0–15.0)
MCH: 30.6 pg (ref 26.0–34.0)
MCHC: 34.3 g/dL (ref 30.0–36.0)
MCV: 89.3 fL (ref 80.0–100.0)
Platelets: 277 10*3/uL (ref 150–400)
RBC: 4.41 MIL/uL (ref 3.87–5.11)
RDW: 11.9 % (ref 11.5–15.5)
WBC: 6.7 10*3/uL (ref 4.0–10.5)
nRBC: 0 % (ref 0.0–0.2)

## 2022-07-02 LAB — COMPREHENSIVE METABOLIC PANEL
ALT: 11 U/L (ref 0–44)
AST: 13 U/L — ABNORMAL LOW (ref 15–41)
Albumin: 4.3 g/dL (ref 3.5–5.0)
Alkaline Phosphatase: 47 U/L (ref 38–126)
Anion gap: 8 (ref 5–15)
BUN: 13 mg/dL (ref 6–20)
CO2: 27 mmol/L (ref 22–32)
Calcium: 9 mg/dL (ref 8.9–10.3)
Chloride: 105 mmol/L (ref 98–111)
Creatinine, Ser: 0.76 mg/dL (ref 0.44–1.00)
GFR, Estimated: >60 mL/min (ref >60)
Glucose, Bld: 136 mg/dL — ABNORMAL HIGH (ref 70–99)
Potassium: 3.9 mmol/L (ref 3.5–5.1)
Sodium: 140 mmol/L (ref 135–145)
Total Bilirubin: 0.5 mg/dL (ref 0.3–1.2)
Total Protein: 7 g/dL (ref 6.5–8.1)

## 2022-07-02 LAB — HEMOGLOBIN A1C
Hgb A1c MFr Bld: 4.6 % — ABNORMAL LOW (ref 4.8–5.6)
Mean Plasma Glucose: 85.32 mg/dL

## 2022-07-02 LAB — TSH: TSH: 0.519 u[IU]/mL (ref 0.350–4.500)

## 2022-07-02 LAB — PREGNANCY, URINE: Preg Test, Ur: NEGATIVE

## 2022-07-02 LAB — T4, FREE: Free T4: 0.81 ng/dL (ref 0.61–1.12)

## 2022-07-02 MED ORDER — LAMOTRIGINE 100 MG PO TABS
100.0000 mg | ORAL_TABLET | Freq: Two times a day (BID) | ORAL | Status: DC
  Administered 2022-07-04 – 2022-07-06 (×5): 100 mg via ORAL
  Filled 2022-07-02 (×7): qty 1

## 2022-07-02 NOTE — BH IP Treatment Plan (Signed)
Interdisciplinary Treatment and Diagnostic Plan Update  07/02/2022 Time of Session: 0830 Karen [name] MRN: [id number]  Principal Diagnosis: MDD (major depressive disorder), recurrent episode, severe ([place])  Secondary Diagnoses: Principal Problem:   MDD (major depressive disorder), recurrent episode, severe ([place])   Current Medications:  Current Facility-Administered Medications  Medication Dose Route Frequency Provider Last Rate Last Admin   acetaminophen (TYLENOL) tablet 650 mg  650 mg Oral Q6H PRN Leevy-Johnson, Brooke A, NP   650 mg at 07/01/22 1647   alum & mag hydroxide-simeth (MAALOX/MYLANTA) 200-200-20 MG/5ML suspension 30 mL  30 mL Oral Q4H PRN Leevy-Johnson, Brooke A, NP       ARIPiprazole (ABILIFY) tablet 10 mg  10 mg Oral Daily France Ravens, MD   10 mg at 07/02/22 0816   gabapentin (NEURONTIN) capsule 600 mg  600 mg Oral TID Leevy-Johnson, Brooke A, NP   600 mg at 07/02/22 7001   hydrOXYzine (ATARAX) tablet 25 mg  25 mg Oral TID PRN France Ravens, MD       lamoTRIgine (LAMICTAL) tablet 50 mg  50 mg Oral Daily France Ravens, MD   50 mg at 07/02/22 7494   And   lamoTRIgine (LAMICTAL) tablet 100 mg  100 mg Oral Aliene Altes, MD   100 mg at 07/01/22 2121   magnesium hydroxide (MILK OF MAGNESIA) suspension 30 mL  30 mL Oral Daily PRN Leevy-Johnson, Brooke A, NP       melatonin tablet 5 mg  5 mg Oral QHS France Ravens, MD   5 mg at 07/01/22 2121   mirtazapine (REMERON) tablet 7.5 mg  7.5 mg Oral QHS France Ravens, MD   7.5 mg at 07/01/22 2121   nicotine polacrilex (NICORETTE) gum 4 mg  4 mg Oral Q4H while awake France Ravens, MD   4 mg at 07/02/22 0715   PTA Medications: Medications Prior to Admission  Medication Sig Dispense Refill Last Dose   acetaminophen (TYLENOL) 325 MG tablet Take 2 tablets (650 mg total) by mouth every 6 (six) hours as needed for mild pain or moderate pain. 30 tablet 1 Past Week   ARIPiprazole (ABILIFY) 5 MG tablet Take 1 tablet (5 mg total) by mouth daily. 30 tablet  2 06/30/2022   buPROPion (WELLBUTRIN XL) 150 MG 24 hr tablet Take 1 tablet (150 mg total) by mouth daily. 30 tablet 2 06/30/2022   gabapentin (NEURONTIN) 300 MG capsule Take 2 capsules (600 mg total) by mouth 3 (three) times daily. 180 capsule 1 06/30/2022   ibuprofen (ADVIL) 800 MG tablet Take 1 tablet (800 mg total) by mouth every 8 (eight) hours as needed for moderate pain, cramping or headache. You may start medication after 1 am tonight (8 hours after toradol injection given in the hospital). 30 tablet 0 Past Month   medroxyPROGESTERone (DEPO-PROVERA) 150 MG/ML injection Inject 150 mg into the muscle every 3 (three) months. Next dose due 07/03/22      Multiple Vitamins-Minerals (MULTIVITAMIN WITH MINERALS) tablet Take 1 tablet by mouth daily.   06/30/2022   valACYclovir (VALTREX) 1000 MG tablet Take 1,000 mg by mouth daily as needed. Only takes when onset of symptoms of occur and takes until symptoms resolved      lamoTRIgine (LAMICTAL) 100 MG tablet Take 1 tablet (100 mg total) by mouth 2 (two) times daily. 60 tablet 2     Patient Stressors: Loss of father terminally ill in hospice   Medication change or noncompliance    Patient Strengths: Ability for insight  Active sense of humor  Average or above average intelligence  Capable of independent living   Treatment Modalities: Medication Management, Group therapy, Case management,  1 to 1 session with clinician, Psychoeducation, Recreational therapy.   Physician Treatment Plan for Primary Diagnosis: MDD (major depressive disorder), recurrent episode, severe ([place]) Long Term Goal(s):     Short Term Goals:    Medication Management: Evaluate patient's response, side effects, and tolerance of medication regimen.  Therapeutic Interventions: 1 to 1 sessions, Unit Group sessions and Medication administration.  Evaluation of Outcomes: Progressing  Physician Treatment Plan for Secondary Diagnosis: Principal Problem:   MDD (major depressive  disorder), recurrent episode, severe ([place])  Long Term Goal(s):     Short Term Goals:       Medication Management: Evaluate patient's response, side effects, and tolerance of medication regimen.  Therapeutic Interventions: 1 to 1 sessions, Unit Group sessions and Medication administration.  Evaluation of Outcomes: Progressing   RN Treatment Plan for Primary Diagnosis: MDD (major depressive disorder), recurrent episode, severe ([place]) Long Term Goal(s): Knowledge of disease and therapeutic regimen to maintain health will improve  Short Term Goals: Ability to remain free from injury will improve, Ability to verbalize frustration and anger appropriately will improve, Ability to demonstrate self-control, Ability to participate in decision making will improve, Ability to verbalize feelings will improve, Ability to disclose and discuss suicidal ideas, Ability to identify and develop effective coping behaviors will improve, and Compliance with prescribed medications will improve  Medication Management: RN will administer medications as ordered by provider, will assess and evaluate patient's response and provide education to patient for prescribed medication. RN will report any adverse and/or side effects to prescribing provider.  Therapeutic Interventions: 1 on 1 counseling sessions, Psychoeducation, Medication administration, Evaluate responses to treatment, Monitor vital signs and CBGs as ordered, Perform/monitor CIWA, COWS, AIMS and Fall Risk screenings as ordered, Perform wound care treatments as ordered.  Evaluation of Outcomes: Progressing   [name] Treatment Plan for Primary Diagnosis: MDD (major depressive disorder), recurrent episode, severe ([place]) Long Term Goal(s): Safe transition to appropriate next level of care at discharge, Engage patient in therapeutic group addressing interpersonal concerns.  Short Term Goals: Engage patient in aftercare planning with referrals and resources, Increase  social support, Increase ability to appropriately verbalize feelings, Increase emotional regulation, Facilitate acceptance of mental health diagnosis and concerns, Facilitate patient progression through stages of change regarding substance use diagnoses and concerns, Identify triggers associated with mental health/substance abuse issues, and Increase skills for wellness and recovery  Therapeutic Interventions: Assess for all discharge needs, 1 to 1 time with Social worker, Explore available resources and support systems, Assess for adequacy in community support network, Educate family and significant other(s) on suicide prevention, Complete Psychosocial Assessment, Interpersonal group therapy.  Evaluation of Outcomes: Progressing   Progress in Treatment: Attending groups: No. Participating in groups: No. Taking medication as prescribed: Yes. Toleration medication: Yes. Family/Significant other contact made: No, will contact:  Patient has declined consent for CSW to reach family/friend.  Patient understands diagnosis: Yes. Discussing patient identified problems/goals with staff: Yes. Medical problems stabilized or resolved: Yes. Denies suicidal/homicidal ideation: No. Issues/concerns per patient self-inventory: Yes. Other: none  New problem(s) identified: No, Describe:  none  New Short Term/Long Term Goal(s):Patient to work towards medication management for mood stabilization; elimination of SI thoughts; development of comprehensive mental wellness plan.  Patient Goals:  Patient states their goal for treatment is to "get into a better routine with planning out life."  Discharge  Plan or Barriers: No psychosocial barriers identified at this time, patient to return to place of residence when appropriate for discharge.   Reason for Continuation of Hospitalization: Depression  Estimated Length of Stay: 1-7 days   Scribe for Treatment Team: Larose Kells 07/02/2022 11:33 AM

## 2022-07-02 NOTE — Progress Notes (Signed)
D) Pt received calm, visible, participating in milieu, and in no acute distress. Pt A & O x4. Pt denies SI, HI, A/ V H, depression, and pain at this time. Pt appears anxious and sadA) Pt encouraged to drink fluids. Pt encouraged to come to staff with needs. Pt encouraged to attend and participate in groups. Pt encouraged to set reachable goals.  R) Pt remained safe on unit, in no acute distress, will continue to assess.      07/02/22 1930  Psych Admission Type (Psych Patients Only)  Admission Status Voluntary  Psychosocial Assessment  Patient Complaints None  Eye Contact Fair  Facial Expression Anxious;Flat  Affect Depressed;Sad;Anxious  Speech Logical/coherent  Interaction Cautious  Motor Activity Other (Comment) (unremarkable)  Appearance/Hygiene Unremarkable  Behavior Characteristics Anxious;Cooperative  Mood Sad;Anxious  Thought Process  Coherency WDL  Content WDL  Delusions None reported or observed  Perception WDL  Hallucination None reported or observed  Judgment WDL  Confusion None  Danger to Self  Current suicidal ideation? Denies  Danger to Others  Danger to Others None reported or observed

## 2022-07-02 NOTE — BHH Group Notes (Signed)
Adult Psychoeducational Group Note  Date:  07/02/2022 Time:  3:50 PM  Group Topic/Focus:  Goals Group:   The focus of this group is to help patients establish daily goals to achieve during treatment and discuss how the patient can incorporate goal setting into their daily lives to aide in recovery.  Participation Level:  Active  Participation Quality:  Appropriate  Affect:  Appropriate  Cognitive:  Appropriate  Insight: Appropriate  Engagement in Group:  Engaged  Modes of Intervention:  Education  Additional Comments:  Patient attended goals group and was attentive the duration of it. Patient's goal was to get a discharge plan.   Shermar Friedland T Ria Comment 07/02/2022, 3:50 PM

## 2022-07-02 NOTE — Progress Notes (Signed)
Adult Psychoeducational Group Note  Date:  07/02/2022 Time:  8:46 PM  Group Topic/Focus:  AA/Wrap-Up  Participation Level:  Did Not Attend  Participation Quality:   n/a  Affect:   n/a  Cognitive:   n/a  Insight: None  Engagement in Group:   n/a  Modes of Intervention:   n/a  Additional Comments:   Pt did not attend the AA/Wrap-Up group.  [place] 07/02/2022, 8:46 PM

## 2022-07-02 NOTE — Group Note (Signed)
[facility] [name] Group Therapy Note   Group Date: 07/02/2022 Start Time: 1300 End Time: 1400   Type of Therapy and Topic: Group Therapy: Avoiding Self-Sabotaging and Enabling Behaviors  Participation Level: Active  Description of Group:  In this group, patients will learn how to identify obstacles, self-sabotaging and enabling behaviors, as well as: what are they, why do we do them and what needs these behaviors meet. Discuss unhealthy relationships and how to have positive healthy boundaries with those that sabotage and enable. Explore aspects of self-sabotage and enabling in yourself and how to limit these self-destructive behaviors in everyday life.   Therapeutic Goals: 1. Patient will identify one obstacle that relates to self-sabotage and enabling behaviors 2. Patient will identify one personal self-sabotaging or enabling behavior they did prior to admission 3. Patient will state a plan to change the above identified behavior 4. Patient will demonstrate ability to communicate their needs through discussion and/or role play.    Summary of Patient Progress: Patient was present for the later half of the group session. Patient was an active listener and participated in the topic of discussion, provided helpful advice to others, and added nuance to topic of conversation. Patient was helpful in bringing up the serenity prayer as a solution to another group member's question.    Therapeutic Modalities:  Cognitive Behavioral Therapy Person-Centered Therapy Motivational Interviewing    Durenda Hurt, [place]

## 2022-07-02 NOTE — Progress Notes (Signed)
D:  Patient's self inventory sheet, patient has good sleep, no sleep medication.  Fair appetite, normal energy level, poor concentration.  Rated depression 6, denied hopeless, anxiety 5.  Denied withdrawals.  Denied SI.  Denied physical problems.  Denied physical pain.  Not sure of goal.  No discharge plans. A:  Medications administered per MD orders.  Emotional support and encouragement given patient. R:  Denied SI and HI, contracts for safety.  Denied A/V hallucinations.  Safety maintained with 15 minute checks.

## 2022-07-02 NOTE — Group Note (Signed)
Occupational Therapy Group Note   Group Topic:Goal Setting  Group Date: 07/02/2022 Start Time: 1400 End Time: 1440 Facilitators: Brantley Stage, OT   Group Description: Group encouraged engagement and participation through discussion focused on goal setting. Group members were introduced to goal-setting using the SMART Goal framework, identifying goals as Specific, Measureable, Acheivable, Relevant, and Time-Bound. Group members took time from group to create their own personal goal reflecting the SMART goal template and shared for review by peers and OT.    Therapeutic Goal(s):  Identify at least one goal that fits the SMART framework    Participation Level: Active and Engaged   Participation Quality: Independent   Behavior: Appropriate   Speech/Thought Process: Coherent and Directed   Affect/Mood: Appropriate   Insight: Good   Judgement: Good   Individualization: pt was active / engaged in their participation of group discussion/activity. New skills identified  Modes of Intervention: Discussion and Education  Patient Response to Interventions:  Attentive, Engaged, Interested , and Receptive   Plan: Continue to engage patient in OT groups 2 - 3x/week.  07/02/2022  Brantley Stage, OT Cornell Barman, OT

## 2022-07-02 NOTE — Group Note (Signed)
Recreation Therapy Group Note   Group Topic:Stress Management  Group Date: 07/02/2022 Start Time: 0940 End Time: 0955 Facilitators: Victorino Sparrow, [place] Location: [address] Day[address]   Goal Area(s) Addresses:  Patient will identify positive stress management techniques. Patient will identify benefits of using stress management post d/c.  Group Description:  Meditation.  LRT played meditation that focused on making choices.  It talked about how thought can creep into your mind during meditation and how you have a choice to push those thoughts.  It also expressed how we have choices throughout the day and how those choices can impact the day ahead.   Affect/Mood: Appropriate   Participation Level: Engaged   Participation Quality: Independent   Behavior: Appropriate   Speech/Thought Process: Focused   Insight: Good   Judgement: Good   Modes of Intervention: Meditation   Patient Response to Interventions:  Engaged   Education Outcome:  Acknowledges education and In group clarification offered    Clinical Observations/Individualized Feedback: Pt attended and participated in group session.    Plan: Continue to engage patient in RT group sessions 2-3x/week.   Victorino Sparrow, LRT,CTRS 07/02/2022 11:48 AM

## 2022-07-02 NOTE — [facility] Group Notes (Signed)
Spiritual care group on grief and loss facilitated by chaplain Karen [name], [facility]   Group Goal:   Support / Education around grief and loss   Members engage in facilitated group support and psycho-social education.   Group Description:   Following introductions and group rules, group members engaged in facilitated group dialog and support around topic of loss, with particular support around experiences of loss in their lives. Group Identified types of loss (relationships / self / things) and identified patterns, circumstances, and changes that precipitate losses. Reflected on thoughts / feelings around loss, normalized grief responses, and recognized variety in grief experience. Group noted Worden's four tasks of grief in discussion.   Group drew on Adlerian / Rogerian, narrative, [place],   Patient Progress: Karen [name] attended group and participated and engaged in the conversation.  Her comments showed insight and she was supportive of peers.  [address] [place] Pager, [phone]

## 2022-07-02 NOTE — Progress Notes (Signed)
[facility] MD Progress Note  07/02/2022 12:40 PM Karen [name]  MRN:  [id number]   Reason for Admission:  Karen [name] is a 31 y.o. female with psychiatric hx of type I bipolar disorder, PTSD, substance abuse who presented to [facility] on 07/01/2022 voluntarily with worsening depression and active SI with plan.  The patient is currently on Hospital Day 1.   Chart Review from last 24 hours:  The patient's chart was reviewed and nursing notes were reviewed. The patient's case was discussed in multidisciplinary team meeting. Per [facility], patient was taking medications appropriately. Per nursing, patient is calm and cooperative and attended 1 group sessions. She also had a depressed affect and wanted to go to bed early due to meeting many people. The following PRN medications were given: 1x tylenol    Information Obtained Today During Patient Interview: The patient was seen and evaluated on the unit. On assessment today the patient reports feeling "tired" today.  She attributes this due to feeling like she has an "emotional hangover" from speaking with many people yesterday. She reports sleeping well and feels that her gabapentin helps with this. She continues to feel anxiety regarding eating in the cafeteria and she states "I just eat enough to get by but the act of finding a place to sit and wanting to be alone in the cafeteria and also when I turn in my tray, it gives me a lot of anxiety". She is agreeable to seeing how her eating patterns are today and if she continues to have high anxiety to where she cannot fully eat, she can receive her meal in her room. She still continues to have depression and anhedonia, having difficulty to being motivated to to engaged in group. Denies current SI, saying the last time having this feeling was yesterday, Denies HI and AVH. She is open to starting lithium during this hospital stay. Denies adverse effects from her medications such as HA, akathisia, rash, dizziness, and  constipation.    Principal Problem: MDD (major depressive disorder), recurrent episode, severe ([place]) Diagnosis: Principal Problem:   MDD (major depressive disorder), recurrent episode, severe ([place])    Past Psychiatric History:  Previous Psych Diagnoses: Type I bipolar disorder, PTSD, substance abuse Prior inpatient treatment: [facility] 2013, [facility] 11/2019 Current/prior outpatient treatment: Lamictal 100 mg twice daily, Abilify 5 mg daily, gabapentin 600 mg 3 times daily, Wellbutrin XL 150 mg daily Psychotherapy hx: Alvera Singh regularly History of suicide: 3 attempts History of homicide: denies Psychiatric medication history: trazodone (hypotension and dizziness), Zoloft, Prozac, Lexapro, Prozac, lithium, Tegretol Psychiatric medication compliance history: fair Neuromodulation history: IV ketamine (open to consideration for Spravato) Current Psychiatrist: Ileene Musa Current therapist: Alvera Singh  Past Medical History:  Past Medical History:  Diagnosis Date   Anxiety    Bipolar disorder ([place])    Chronic UTI    Depression    Endometriosis    HSV (herpes simplex virus) anogenital infection    PTSD (post-traumatic stress disorder)     Past Surgical History:  Procedure Laterality Date   CESAREAN SECTION N/A 02/17/2018   Procedure: CESAREAN SECTION;  Surgeon: Sanjuana Kava, MD;  Location: [place];  Service: Obstetrics;  Laterality: N/A;   COLONOSCOPY     DIAGNOSTIC LAPAROSCOPY  11/05/2012   DILATION AND EVACUATION N/A 03/11/2015   Procedure: DILATATION AND EVACUATION;  Surgeon: Waymon Amato, MD;  Location: [facility];  Service: Gynecology;  Laterality: N/A;   DRUG INDUCED ENDOSCOPY     LAPAROSCOPY  LAPAROSCOPY N/A 06/07/2021   Procedure: LAPAROSCOPY OPERATIVE with /EXCISION OF LESIONS and lysis of adhesions;  Surgeon: Waymon Amato, MD;  Location: [place];  Service: Gynecology;  Laterality: N/A;   TONSILLECTOMY  11/05/2013   Family History:  Family History  Problem  Relation Age of Onset   Rheum arthritis Mother    Cancer Father    Family History: Medical: grandfather that died from dementia Psych: paternal aunt with schizoaffective disorder, sister with OCD, maternal aunt with bipolar disorder, mom with bipolar disorder, paternal grandfather with schizophrenia Psych Rx: unknown SA/HA: unknown  Social History: Social History: Marital Status: divorced Children: 58 yo son Employment: physician substitute Trauma: endorses, but unwilling to discuss (open to discussing with therapist) Housing: lives in a condo Finances/Disability: employed  Scientist, research (physical sciences) difficulties: denies Nature conservation officer: denies  Sleep:  Fair  Appetite:  Fair  Current Medications: Current Facility-Administered Medications  Medication Dose Route Frequency Provider Last Rate Last Admin   acetaminophen (TYLENOL) tablet 650 mg  650 mg Oral Q6H PRN Leevy-Johnson, Brooke A, NP   650 mg at 07/01/22 1647   alum & mag hydroxide-simeth (MAALOX/MYLANTA) 200-200-20 MG/5ML suspension 30 mL  30 mL Oral Q4H PRN Leevy-Johnson, Brooke A, NP       ARIPiprazole (ABILIFY) tablet 10 mg  10 mg Oral Daily France Ravens, MD   10 mg at 07/02/22 0816   gabapentin (NEURONTIN) capsule 600 mg  600 mg Oral TID Leevy-Johnson, Brooke A, NP   600 mg at 07/02/22 1202   hydrOXYzine (ATARAX) tablet 25 mg  25 mg Oral TID PRN France Ravens, MD       lamoTRIgine (LAMICTAL) tablet 50 mg  50 mg Oral Daily France Ravens, MD   50 mg at 07/02/22 0277   And   lamoTRIgine (LAMICTAL) tablet 100 mg  100 mg Oral Aliene Altes, MD   100 mg at 07/01/22 2121   [START ON 07/04/2022] lamoTRIgine (LAMICTAL) tablet 100 mg  100 mg Oral BID Stormy Fabian, MD       magnesium hydroxide (MILK OF MAGNESIA) suspension 30 mL  30 mL Oral Daily PRN Leevy-Johnson, Brooke A, NP       melatonin tablet 5 mg  5 mg Oral QHS France Ravens, MD   5 mg at 07/01/22 2121   mirtazapine (REMERON) tablet 7.5 mg  7.5 mg Oral QHS France Ravens, MD   7.5 mg at 07/01/22 2121   nicotine  polacrilex (NICORETTE) gum 4 mg  4 mg Oral Q4H while awake France Ravens, MD   4 mg at 07/02/22 0715    Lab Results:  Results for orders placed or performed during the hospital encounter of 07/01/22 (from the past 48 hour(s))  Pregnancy, urine     Status: None   Collection Time: 07/01/22  7:08 AM  Result Value Ref Range   Preg Test, Ur NEGATIVE NEGATIVE    Comment:        THE SENSITIVITY OF THIS METHODOLOGY IS >20 mIU/mL. Performed at [facility], [place] [address], [place], [place] [address]   SARS Coronavirus 2 by RT PCR (hospital order, performed in [facility] hospital lab) *cepheid single result test* Anterior Nasal Swab     Status: None   Collection Time: 07/01/22  9:50 AM   Specimen: Anterior Nasal Swab  Result Value Ref Range   SARS Coronavirus 2 by RT PCR NEGATIVE NEGATIVE    Comment: (NOTE) SARS-CoV-2 target nucleic acids are NOT DETECTED.  The SARS-CoV-2 RNA is generally detectable in  upper and lower respiratory specimens during the acute phase of infection. The lowest concentration of SARS-CoV-2 viral copies this assay can detect is 250 copies / mL. A negative result does not preclude SARS-CoV-2 infection and should not be used as the sole basis for treatment or other patient management decisions.  A negative result may occur with improper specimen collection / handling, submission of specimen other than nasopharyngeal swab, presence of viral mutation(s) within the areas targeted by this assay, and inadequate number of viral copies (<250 copies / mL). A negative result must be combined with clinical observations, patient history, and epidemiological information.  Fact Sheet for Patients:   https://www.patel.info/  Fact Sheet for Healthcare Providers: https://hall.com/  This test is not yet approved or  cleared by the [place] FDA and has been authorized for detection and/or diagnosis of SARS-CoV-2  by FDA under an Emergency Use Authorization (EUA).  This EUA will remain in effect (meaning this test can be used) for the duration of the COVID-19 declaration under Section 564(b)(1) of the Act, 21 U.S.C. section 360bbb-3(b)(1), unless the authorization is terminated or revoked sooner.  Performed at [facility], [place] [address], [place], [place] [address]   Rapid urine drug screen (hospital performed) not at [facility]     Status: None   Collection Time: 07/01/22 12:05 PM  Result Value Ref Range   Opiates NONE DETECTED NONE DETECTED   Cocaine NONE DETECTED NONE DETECTED   Benzodiazepines NONE DETECTED NONE DETECTED   Amphetamines NONE DETECTED NONE DETECTED   Tetrahydrocannabinol NONE DETECTED NONE DETECTED   Barbiturates NONE DETECTED NONE DETECTED    Comment: (NOTE) DRUG SCREEN FOR MEDICAL PURPOSES ONLY.  IF CONFIRMATION IS NEEDED FOR ANY PURPOSE, NOTIFY LAB WITHIN 5 DAYS.  LOWEST DETECTABLE LIMITS FOR URINE DRUG SCREEN Drug Class                     Cutoff (ng/mL) Amphetamine and metabolites    1000 Barbiturate and metabolites    200 Benzodiazepine                 702 Tricyclics and metabolites     300 Opiates and metabolites        300 Cocaine and metabolites        300 THC                            50 Performed at [facility], [place] [address], [place], [place] [address]   TSH     Status: None   Collection Time: 07/02/22  6:39 AM  Result Value Ref Range   TSH 0.519 0.350 - 4.500 uIU/mL    Comment: Performed by a 3rd Generation assay with a functional sensitivity of <=0.01 uIU/mL. Performed at [facility], [place] [address], [place], [place] [address]   Lipid panel     Status: Abnormal   Collection Time: 07/02/22  6:39 AM  Result Value Ref Range   Cholesterol 117 0 - 200 mg/dL   Triglycerides 94 <150 mg/dL   HDL 29 (L) >40 mg/dL   Total CHOL/HDL Ratio 4.0 RATIO   VLDL 19 0 - 40 mg/dL   LDL Cholesterol 69 0 - 99  mg/dL    Comment:        Total Cholesterol/HDL:CHD Risk Coronary Heart Disease Risk Table  Men   Women  1/2 Average Risk   3.4   3.3  Average Risk       5.0   4.4  2 X Average Risk   9.6   7.1  3 X Average Risk  23.4   11.0        Use the calculated Patient Ratio above and the CHD Risk Table to determine the patient's CHD Risk.        ATP III CLASSIFICATION (LDL):  <100     mg/dL   Optimal  100-129  mg/dL   Near or Above                    Optimal  130-159  mg/dL   Borderline  160-189  mg/dL   High  >190     mg/dL   Very High Performed at [place] [address] [place], [place] [address]   Comprehensive metabolic panel     Status: Abnormal   Collection Time: 07/02/22  6:39 AM  Result Value Ref Range   Sodium 140 135 - 145 mmol/L   Potassium 3.9 3.5 - 5.1 mmol/L   Chloride 105 98 - 111 mmol/L   CO2 27 22 - 32 mmol/L   Glucose, Bld 136 (H) 70 - 99 mg/dL    Comment: Glucose reference range applies only to samples taken after fasting for at least 8 hours.   BUN 13 6 - 20 mg/dL   Creatinine, Ser 0.76 0.44 - 1.00 mg/dL   Calcium 9.0 8.9 - 10.3 mg/dL   Total Protein 7.0 6.5 - 8.1 g/dL   Albumin 4.3 3.5 - 5.0 g/dL   AST 13 (L) 15 - 41 U/L   ALT 11 0 - 44 U/L   Alkaline Phosphatase 47 38 - 126 U/L   Total Bilirubin 0.5 0.3 - 1.2 mg/dL   GFR, Estimated >60 >60 mL/min    Comment: (NOTE) Calculated using the CKD-EPI Creatinine Equation (2021)    Anion gap 8 5 - 15    Comment: Performed at [facility], [place] [address], [place], [place] [address]  CBC     Status: None   Collection Time: 07/02/22  6:39 AM  Result Value Ref Range   WBC 6.7 4.0 - 10.5 K/uL   RBC 4.41 3.87 - 5.11 MIL/uL   Hemoglobin 13.5 12.0 - 15.0 g/dL   HCT 39.4 36.0 - 46.0 %   MCV 89.3 80.0 - 100.0 fL   MCH 30.6 26.0 - 34.0 pg   MCHC 34.3 30.0 - 36.0 g/dL   RDW 11.9 11.5 - 15.5 %   Platelets 277 150 - 400 K/uL   nRBC 0.0 0.0 - 0.2 %     Comment: Performed at [facility], [place] [address] [place], [place] [address]  Hemoglobin A1c     Status: Abnormal   Collection Time: 07/02/22  6:39 AM  Result Value Ref Range   Hgb A1c MFr Bld 4.6 (L) 4.8 - 5.6 %    Comment: (NOTE) Pre diabetes:          5.7%-6.4%  Diabetes:              >6.4%  Glycemic control for   <7.0% adults with diabetes    Mean Plasma Glucose 85.32 mg/dL    Comment: Performed at [place] [address] [place], [place] [address]    Blood Alcohol level:  Lab Results  Component Value  Date   ETH <10 09/11/2020   ETH <10 95/63/8756    Metabolic Disorder Labs: Lab Results  Component Value Date   HGBA1C 4.6 (L) 07/02/2022   MPG 85.32 07/02/2022   MPG 85.32 09/11/2020   No results found for: "PROLACTIN" Lab Results  Component Value Date   CHOL 117 07/02/2022   TRIG 94 07/02/2022   HDL 29 (L) 07/02/2022   CHOLHDL 4.0 07/02/2022   VLDL 19 07/02/2022   LDLCALC 69 07/02/2022   LDLCALC 78 05/09/2021    Physical Findings: AIMS: Facial and Oral Movements Muscles of Facial Expression: None, normal Lips and Perioral Area: None, normal Jaw: None, normal Tongue: None, normal,Extremity Movements Upper (arms, wrists, hands, fingers): None, normal Lower (legs, knees, ankles, toes): None, normal, Trunk Movements Neck, shoulders, hips: None, normal, Overall Severity Severity of abnormal movements (highest score from questions above): None, normal Incapacitation due to abnormal movements: None, normal Patient's awareness of abnormal movements (rate only patient's report): No Awareness, Dental Status Current problems with teeth and/or dentures?: No Does patient usually wear dentures?: No  CIWA:    COWS:     Musculoskeletal: Strength & Muscle Tone: within normal limits Gait & Station: normal Patient leans: N/A  Psychiatric Specialty Exam:  General Appearance: appears at stated age, casually dressed and  groomed  Behavior: cooperative, low energy  Psychomotor Activity: no psychomotor agitation or retardation noted   Eye Contact: good Speech: normal amount, tone, decreased volume, normal fluency   Mood: depressed Affect: congruent, somber  Thought Process: linear, goal directed, no circumstantial or tangential thought process noted, no racing thoughts or flight of ideas Descriptions of Associations: intact Thought Content: no bizarre content, logical and future-oriented Hallucinations: denies AH, VH , does not appear responding to stimuli Delusions: no paranoia, delusions of control, grandeur, ideas of reference, thought broadcasting, and magical thinking Suicidal Thoughts: denies SI, intention, plan; had SI on admission Homicidal Thoughts: denies HI, intention, plan   Alertness/Orientation: alert and fully oriented  Insight: limited Judgment: limited  Memory: intact  Executive Functions  Concentration: intact  Attention Span: fair Recall: intact Fund of Knowledge: fair    Assets  Assets:Desire for Improvement; Financial Resources/Insurance; Housing; Leisure Time; Physical Health; Resilience; Talents/Skills; Transportation; Vocational/Educational  Physical Exam Constitutional:      Appearance: Normal appearance.  Cardiovascular:     Rate and Rhythm: Normal rate.  Pulmonary:     Effort: Pulmonary effort is normal.  Neurological:     General: No focal deficit present.     Mental Status: Alert and oriented to person, place, and time.    Review of Systems  Constitutional: Negative.  Negative for chills, fever and weight loss.  HENT: Negative.    Eyes: Negative.   Respiratory: Negative.    Cardiovascular: Negative.   Gastrointestinal:  Negative for constipation, diarrhea, nausea and vomiting.  Genitourinary: Negative.   Musculoskeletal: Negative.   Skin: Negative.   Neurological: Negative.  Negative for tingling.    Blood pressure 99/67, pulse 65, temperature  98.1 F (36.7 C), temperature source Oral, resp. rate 16, height 5' (1.524 m), weight (P) 47.6 kg, SpO2 100 %. Body mass index is 20.51 kg/m (pended).   ASSESSMENT:  Diagnoses / Active Problems: Principal Problem: MDD (major depressive disorder), recurrent episode, severe ([place]) Diagnosis: Principal Problem:   MDD (major depressive disorder), recurrent episode, severe ([facility]) R/o borderline personality disorder R/o social anxiety disorder  PLAN: Safety and Monitoring:  -- Voluntary admission to inpatient psychiatric unit for safety, stabilization and  treatment  -- Daily contact with patient to assess and evaluate symptoms and progress in treatment  -- Patient's case to be discussed in multi-disciplinary team meeting  -- Observation Level : q15 minute checks  -- Vital signs:  q12 hours  -- Precautions: suicide, elopement, and assault  2. Medications:   -Continue gabapentin 600 mg 3 times daily for anxiety   - Monitor patient's anxiety regarding eating and can consider changing to having food delivered to her room due to high anxiety eating in public space -Continue Lamictal 50 mg in the morning and 100 mg at night x3 days (last time is 8/29) followed by increase of Lamictal to 100 mg twice daily (starting 8/30) for mood stabilization              -Patient had previously been on higher dose and denies significant side effects from lamotrigine.  Denies any skin rashes from dose adjustments of Lamictal -Continue Remeron 7.5 mg nightly for depression and anxiety with added benefits of sedation at night and appetite stimulation             --Monitor for rebound mania/mood instability -Continue Abilify 10 mg qd as a mood stabilizer -Plan to start Lithium 300 mg BID tomorrow for chronic suicidal ideation and as a mood stabilizer. Discussed with patient regarding side effects and patient is amenable to medication trial IF TSH/CMP appropriate            -- Metabolic profile and EKG monitoring  obtained while on an atypical antipsychotic BMI: 20.51 Lipid Panel: HDL 29 HbgA1c: 4.6 (low) QTc: 425 -- The risks/benefits/side-effects/alternatives to this medication were discussed in detail with the patient and time was given for questions. The patient consents to medication trial.              -- Encouraged patient to participate in unit milieu and in scheduled group therapies         3. Pertinent labs CMP: elevated glucose 136, AST 13 Lipid: HDL 29 CBC: WNL A1c: 4.6 TSH: WNL (0.519)      Lab ordered: free T4 level   4. Tobacco Use Disorder -- Nicorette gum 4 mg q4h while awake for NRT -- Allergic to adhesive in nicotine patch  -- Smoking cessation encouraged  5. Group and Therapy: -- Encouraged patient to participate in unit milieu and in scheduled group therapies     -- Short Term Goals: Ability to identify changes in lifestyle to reduce recurrence of condition will improve, Ability to verbalize feelings will improve, Ability to disclose and discuss suicidal ideas, Ability to demonstrate self-control will improve, Ability to identify and develop effective coping behaviors will improve, Ability to maintain clinical measurements within normal limits will improve, Compliance with prescribed medications will improve, and Ability to identify triggers associated with substance abuse/mental health issues will improve  -- Long Term Goals: Improvement in symptoms so as ready for discharge  6. Discharge Planning:   -- Social work and case management to assist with discharge planning and identification of hospital follow-up needs prior to discharge  -- Estimated LOS: 5-7 days  -- Discharge Concerns: Need to establish a safety plan; Medication compliance and effectiveness  -- Discharge Goals: Return home with outpatient referrals for mental health follow-up including medication management/psychotherapy      Total Time Spent in Direct Patient Care:  I personally spent 30 minutes on the  unit in direct patient care. The direct patient care time included face-to-face time with the patient, reviewing the  patient's chart, communicating with other professionals, and coordinating care. Greater than 50% of this time was spent in counseling or coordinating care with the patient regarding goals of hospitalization, psycho-education, and discharge planning needs.   Stormy Fabian, MD 07/02/2022, 12:40 PM

## 2022-07-02 NOTE — Plan of Care (Signed)
Nurse discussed anxiety, depression and coping skills with patient.  

## 2022-07-02 NOTE — Progress Notes (Signed)
Patient reports that she ate 50% of her lunch today 07/02/22.

## 2022-07-03 ENCOUNTER — Telehealth (HOSPITAL_COMMUNITY): Payer: 59 | Admitting: Physician Assistant

## 2022-07-03 DIAGNOSIS — F332 Major depressive disorder, recurrent severe without psychotic features: Secondary | ICD-10-CM | POA: Diagnosis not present

## 2022-07-03 MED ORDER — LITHIUM CARBONATE 300 MG PO CAPS
300.0000 mg | ORAL_CAPSULE | Freq: Two times a day (BID) | ORAL | Status: DC
  Administered 2022-07-03 – 2022-07-06 (×7): 300 mg via ORAL
  Filled 2022-07-03 (×12): qty 1

## 2022-07-03 NOTE — Progress Notes (Signed)
   07/03/22 0907  Psych Admission Type (Psych Patients Only)  Admission Status Voluntary  Psychosocial Assessment  Patient Complaints None  Eye Contact Fair  Facial Expression Flat  Affect Depressed  Speech Logical/coherent  Interaction Minimal  Motor Activity Other (Comment) (WNL)  Appearance/Hygiene Unremarkable  Behavior Characteristics Cooperative;Calm  Mood Depressed  Thought Process  Coherency WDL  Content WDL  Delusions None reported or observed  Perception WDL  Hallucination None reported or observed  Judgment Limited  Confusion None  Danger to Self  Current suicidal ideation? Denies  Agreement Not to Harm Self Yes  Description of Agreement verbally contracts for safety  Danger to Others  Danger to Others None reported or observed

## 2022-07-03 NOTE — Group Note (Signed)
Recreation Therapy Group Note   Group Topic:Animal Assisted Therapy   Group Date: 07/03/2022 Start Time: 1430 End Time: 1500 Facilitators: Victorino Sparrow, LRT,CTRS Location: [address] Dayroom   Animal-Assisted Activity (AAA) Program Checklist/Progress Notes Patient Eligibility Criteria Checklist & Daily Group note for Rec Tx Intervention  AAA/T Program Assumption of Risk Form signed by Patient/ or Parent Legal Guardian Yes  Patient is free of allergies or severe asthma Yes  Patient reports no fear of animals Yes  Patient reports no history of cruelty to animals Yes  Patient understands his/her participation is voluntary Yes  Patient washes hands before animal contact Yes  Patient washes hands after animal contact Yes   Affect/Mood: Appropriate   Participation Level: Engaged   Participation Quality: Independent   Behavior: Appropriate    Clinical Observations/Individualized Feedback:  Patient attended session and interacted appropriately with therapy dog and peers. Patient asked appropriate questions about therapy dog and his training. Patient shared stories about their pets at home with group.   Plan: Continue to engage patient in RT group sessions 2-3x/week.   Victorino Sparrow, Glennis Brink 07/03/2022 3:40 PM

## 2022-07-03 NOTE — Plan of Care (Signed)
  Problem: Coping: Goal: Coping ability will improve Outcome: Progressing   Problem: Health Behavior/Discharge Planning: Goal: Compliance with therapeutic regimen will improve Outcome: Progressing   Problem: Safety: Goal: Ability to disclose and discuss suicidal ideas will improve Outcome: Progressing   Problem: Self-Concept: Goal: Level of anxiety will decrease Outcome: Progressing

## 2022-07-03 NOTE — Progress Notes (Signed)
[place] Group Notes:  (Nursing/MHT/Case Management/Adjunct)  Date:  07/03/2022  Time:  2020  Type of Therapy:   wrap up group  Participation Level:  Active  Participation Quality:  Appropriate, Attentive, Sharing, and Supportive  Affect:  Anxious  Cognitive:  Alert  Insight:  Improving  Engagement in Group:  Engaged  Modes of Intervention:  Clarification, Education, and Socialization  Summary of Progress/Problems: Positive thinking and self-care were discussed.   Winfield Rast S 07/03/2022, 9:10 PM

## 2022-07-03 NOTE — Progress Notes (Signed)
[facility] MD Progress Note  07/03/2022 9:12 AM Karen [name]  MRN:  [id number]   Reason for Admission:  Karen [name] is a 31 y.o. female with psychiatric hx of type I bipolar disorder, PTSD, substance abuse who presented to [facility] on 07/01/2022 voluntarily with worsening depression and active SI with plan.  The patient is currently on Hospital Day 2.   Chart Review from last 24 hours:  The patient's chart was reviewed and nursing notes were reviewed. The patient's case was discussed in multidisciplinary team meeting. Per [facility], patient was taking medications appropriately. Per nursing, patient is calm and cooperative and attended 4 group sessions. It was noted that she ate 50 % of yesterday's lunch. No PRN medications were given   Information Obtained Today During Patient Interview: The patient was seen and evaluated on the unit. On assessment, the patient feels "better" today and attributes this to being more on a routine. She is content that she has been able to have a set routine in the hospital and take her medications. She feels more comfortable in the group sessions noting that she is learning new coping skills like the "2 minute rule". She denies having issues with sleep. She reports eating three meals each day here which she has not been doing at home. She is agreeable to continue going to the cafeteria to eat. She denies adverse effects from her medications. She is agreeable to starting lithium today and we discussed the benefits/risk/side effects/alternatives. She reports having tried this medication in the past but was taken off due to drinking alcohol while on the medication. Initially she was worried of having to stay longer due to having to be monitored on the lithium and obtaining a lithium level, and this is because she needed to go to [place] to say goodbye to her father. When told a more concrete timeline of obtaining lithium levels on Friday for a plan to be discharged Saturday, she  was agreeable to this plan. Denies anxiety and feels her overall mood is improving. She denies SI, stating the last night she has thoughts was prior to admission. Denies HI, AVH.    Principal Problem: MDD (major depressive disorder), recurrent episode, severe ([place]) Diagnosis: Principal Problem:   MDD (major depressive disorder), recurrent episode, severe ([place])    Past Psychiatric History:  Previous Psych Diagnoses: Type I bipolar disorder, PTSD, substance abuse Prior inpatient treatment: [facility] 2013, [facility] 11/2019 Current/prior outpatient treatment: Lamictal 100 mg twice daily, Abilify 5 mg daily, gabapentin 600 mg 3 times daily, Wellbutrin XL 150 mg daily Psychotherapy hx: Alvera Singh regularly History of suicide: 3 attempts History of homicide: denies Psychiatric medication history: trazodone (hypotension and dizziness), Zoloft, Prozac, Lexapro, Prozac, lithium, Tegretol Psychiatric medication compliance history: fair Neuromodulation history: IV ketamine (open to consideration for Spravato) Current Psychiatrist: Ileene Musa Current therapist: Alvera Singh  Past Medical History:  Past Medical History:  Diagnosis Date   Anxiety    Bipolar disorder ([place])    Chronic UTI    Depression    Endometriosis    HSV (herpes simplex virus) anogenital infection    PTSD (post-traumatic stress disorder)     Past Surgical History:  Procedure Laterality Date   CESAREAN SECTION N/A 02/17/2018   Procedure: CESAREAN SECTION;  Surgeon: Sanjuana Kava, MD;  Location: [place];  Service: Obstetrics;  Laterality: N/A;   COLONOSCOPY     DIAGNOSTIC LAPAROSCOPY  11/05/2012   DILATION AND EVACUATION N/A 03/11/2015   Procedure: DILATATION AND EVACUATION;  Surgeon: Waymon Amato, MD;  Location: [facility];  Service: Gynecology;  Laterality: N/A;   DRUG INDUCED ENDOSCOPY     LAPAROSCOPY     LAPAROSCOPY N/A 06/07/2021   Procedure: LAPAROSCOPY OPERATIVE with /EXCISION OF LESIONS and lysis of  adhesions;  Surgeon: Waymon Amato, MD;  Location: [place];  Service: Gynecology;  Laterality: N/A;   TONSILLECTOMY  11/05/2013   Family History:  Family History  Problem Relation Age of Onset   Rheum arthritis Mother    Cancer Father    Family History: Medical: grandfather that died from dementia Psych: paternal aunt with schizoaffective disorder, sister with OCD, maternal aunt with bipolar disorder, mom with bipolar disorder, paternal grandfather with schizophrenia Psych Rx: unknown SA/HA: unknown  Social History: Social History: Marital Status: divorced Children: 40 yo son Employment: physician substitute Trauma: endorses, but unwilling to discuss (open to discussing with therapist) Housing: lives in a condo Finances/Disability: employed  Scientist, research (physical sciences) difficulties: denies Nature conservation officer: denies  Sleep:  Fair  Appetite:  Fair  Current Medications: Current Facility-Administered Medications  Medication Dose Route Frequency Provider Last Rate Last Admin   acetaminophen (TYLENOL) tablet 650 mg  650 mg Oral Q6H PRN Leevy-Johnson, Brooke A, NP   650 mg at 07/01/22 1647   alum & mag hydroxide-simeth (MAALOX/MYLANTA) 200-200-20 MG/5ML suspension 30 mL  30 mL Oral Q4H PRN Leevy-Johnson, Brooke A, NP       ARIPiprazole (ABILIFY) tablet 10 mg  10 mg Oral Daily France Ravens, MD   10 mg at 07/03/22 0804   gabapentin (NEURONTIN) capsule 600 mg  600 mg Oral TID Leevy-Johnson, Brooke A, NP   600 mg at 07/03/22 7829   hydrOXYzine (ATARAX) tablet 25 mg  25 mg Oral TID PRN France Ravens, MD       lamoTRIgine (LAMICTAL) tablet 100 mg  100 mg Oral QHS France Ravens, MD   100 mg at 07/02/22 2114   [START ON 07/04/2022] lamoTRIgine (LAMICTAL) tablet 100 mg  100 mg Oral BID Stormy Fabian, MD       lithium carbonate capsule 300 mg  300 mg Oral BID WC Stormy Fabian, MD       magnesium hydroxide (MILK OF MAGNESIA) suspension 30 mL  30 mL Oral Daily PRN Leevy-Johnson, Brooke A, NP       melatonin tablet 5 mg  5 mg Oral QHS France Ravens, MD   5 mg at 07/01/22 2121   mirtazapine (REMERON) tablet 7.5 mg  7.5 mg Oral QHS France Ravens, MD   7.5 mg at 07/02/22 2114   nicotine polacrilex (NICORETTE) gum 4 mg  4 mg Oral Q4H while awake France Ravens, MD   4 mg at 07/03/22 5621    Lab Results:  Results for orders placed or performed during the hospital encounter of 07/01/22 (from the past 48 hour(s))  SARS Coronavirus 2 by RT PCR (hospital order, performed in [facility] hospital lab) *cepheid single result test* Anterior Nasal Swab     Status: None   Collection Time: 07/01/22  9:50 AM   Specimen: Anterior Nasal Swab  Result Value Ref Range   SARS Coronavirus 2 by RT PCR NEGATIVE NEGATIVE    Comment: (NOTE) SARS-CoV-2 target nucleic acids are NOT DETECTED.  The SARS-CoV-2 RNA is generally detectable in upper and lower respiratory specimens during the acute phase of infection. The lowest concentration of SARS-CoV-2 viral copies this assay can detect is 250 copies / mL. A negative result does not preclude SARS-CoV-2 infection and should not be  used as the sole basis for treatment or other patient management decisions.  A negative result may occur with improper specimen collection / handling, submission of specimen other than nasopharyngeal swab, presence of viral mutation(s) within the areas targeted by this assay, and inadequate number of viral copies (<250 copies / mL). A negative result must be combined with clinical observations, patient history, and epidemiological information.  Fact Sheet for Patients:   https://www.patel.info/  Fact Sheet for Healthcare Providers: https://hall.com/  This test is not yet approved or  cleared by the [place] FDA and has been authorized for detection and/or diagnosis of SARS-CoV-2 by FDA under an Emergency Use Authorization (EUA).  This EUA will remain in effect (meaning this test can be used) for the duration of the COVID-19  declaration under Section 564(b)(1) of the Act, 21 U.S.C. section 360bbb-3(b)(1), unless the authorization is terminated or revoked sooner.  Performed at [facility], [place] [address], [place], [place] [address]   Rapid urine drug screen (hospital performed) not at [facility]     Status: None   Collection Time: 07/01/22 12:05 PM  Result Value Ref Range   Opiates NONE DETECTED NONE DETECTED   Cocaine NONE DETECTED NONE DETECTED   Benzodiazepines NONE DETECTED NONE DETECTED   Amphetamines NONE DETECTED NONE DETECTED   Tetrahydrocannabinol NONE DETECTED NONE DETECTED   Barbiturates NONE DETECTED NONE DETECTED    Comment: (NOTE) DRUG SCREEN FOR MEDICAL PURPOSES ONLY.  IF CONFIRMATION IS NEEDED FOR ANY PURPOSE, NOTIFY LAB WITHIN 5 DAYS.  LOWEST DETECTABLE LIMITS FOR URINE DRUG SCREEN Drug Class                     Cutoff (ng/mL) Amphetamine and metabolites    1000 Barbiturate and metabolites    200 Benzodiazepine                 517 Tricyclics and metabolites     300 Opiates and metabolites        300 Cocaine and metabolites        300 THC                            50 Performed at [facility] [place] [address] [place], [place] [address]   TSH     Status: None   Collection Time: 07/02/22  6:39 AM  Result Value Ref Range   TSH 0.519 0.350 - 4.500 uIU/mL    Comment: Performed by a 3rd Generation assay with a functional sensitivity of <=0.01 uIU/mL. Performed at [facility], [place] [address], [place], [place] [address]   Lipid panel     Status: Abnormal   Collection Time: 07/02/22  6:39 AM  Result Value Ref Range   Cholesterol 117 0 - 200 mg/dL   Triglycerides 94 <150 mg/dL   HDL 29 (L) >40 mg/dL   Total CHOL/HDL Ratio 4.0 RATIO   VLDL 19 0 - 40 mg/dL   LDL Cholesterol 69 0 - 99 mg/dL    Comment:        Total Cholesterol/HDL:CHD Risk Coronary Heart Disease Risk Table                     Men   Women  1/2 Average Risk   3.4    3.3  Average Risk       5.0   4.4  2 X Average Risk  9.6   7.1  3 X Average Risk  23.4   11.0        Use the calculated Patient Ratio above and the CHD Risk Table to determine the patient's CHD Risk.        ATP III CLASSIFICATION (LDL):  <100     mg/dL   Optimal  100-129  mg/dL   Near or Above                    Optimal  130-159  mg/dL   Borderline  160-189  mg/dL   High  >190     mg/dL   Very High Performed at [place] [address], [place], [place] [address]   Comprehensive metabolic panel     Status: Abnormal   Collection Time: 07/02/22  6:39 AM  Result Value Ref Range   Sodium 140 135 - 145 mmol/L   Potassium 3.9 3.5 - 5.1 mmol/L   Chloride 105 98 - 111 mmol/L   CO2 27 22 - 32 mmol/L   Glucose, Bld 136 (H) 70 - 99 mg/dL    Comment: Glucose reference range applies only to samples taken after fasting for at least 8 hours.   BUN 13 6 - 20 mg/dL   Creatinine, Ser 0.76 0.44 - 1.00 mg/dL   Calcium 9.0 8.9 - 10.3 mg/dL   Total Protein 7.0 6.5 - 8.1 g/dL   Albumin 4.3 3.5 - 5.0 g/dL   AST 13 (L) 15 - 41 U/L   ALT 11 0 - 44 U/L   Alkaline Phosphatase 47 38 - 126 U/L   Total Bilirubin 0.5 0.3 - 1.2 mg/dL   GFR, Estimated >60 >60 mL/min    Comment: (NOTE) Calculated using the CKD-EPI Creatinine Equation (2021)    Anion gap 8 5 - 15    Comment: Performed at [facility], [place] [address], [place], [place] [address]  CBC     Status: None   Collection Time: 07/02/22  6:39 AM  Result Value Ref Range   WBC 6.7 4.0 - 10.5 K/uL   RBC 4.41 3.87 - 5.11 MIL/uL   Hemoglobin 13.5 12.0 - 15.0 g/dL   HCT 39.4 36.0 - 46.0 %   MCV 89.3 80.0 - 100.0 fL   MCH 30.6 26.0 - 34.0 pg   MCHC 34.3 30.0 - 36.0 g/dL   RDW 11.9 11.5 - 15.5 %   Platelets 277 150 - 400 K/uL   nRBC 0.0 0.0 - 0.2 %    Comment: Performed at [facility], [place] [address], [place], [place] [address]  Hemoglobin A1c     Status: Abnormal   Collection Time:  07/02/22  6:39 AM  Result Value Ref Range   Hgb A1c MFr Bld 4.6 (L) 4.8 - 5.6 %    Comment: (NOTE) Pre diabetes:          5.7%-6.4%  Diabetes:              >6.4%  Glycemic control for   <7.0% adults with diabetes    Mean Plasma Glucose 85.32 mg/dL    Comment: Performed at [place] [address], [place], [place] [address]  T4, free     Status: None   Collection Time: 07/02/22  6:12 PM  Result Value Ref Range   Free T4 0.81 0.61 - 1.12 ng/dL    Comment: (NOTE) Biotin ingestion may interfere with free T4 tests. If the  results are inconsistent with the TSH level, previous test results, or the clinical presentation, then consider biotin interference. If needed, order repeat testing after stopping biotin. Performed at [facility], [place] [address] [place], [place] [address]     Blood Alcohol level:  Lab Results  Component Value Date   [facility] <10 09/11/2020   ETH <10 81/82/9937    Metabolic Disorder Labs: Lab Results  Component Value Date   HGBA1C 4.6 (L) 07/02/2022   MPG 85.32 07/02/2022   MPG 85.32 09/11/2020   No results found for: "PROLACTIN" Lab Results  Component Value Date   CHOL 117 07/02/2022   TRIG 94 07/02/2022   HDL 29 (L) 07/02/2022   CHOLHDL 4.0 07/02/2022   VLDL 19 07/02/2022   LDLCALC 69 07/02/2022   LDLCALC 78 05/09/2021    Musculoskeletal: Strength & Muscle Tone: within normal limits Gait & Station: normal Patient leans: N/A  Psychiatric Specialty Exam:  General Appearance: appears at stated age, casually dressed and groomed  Behavior: pleasant and cooperative  Psychomotor Activity: no psychomotor agitation or retardation noted   Eye Contact: good Speech: normal amount, tone, volume and fluency   Mood: euthymic, improving from yesterday Affect: congruent, less restrictive compared to yesterday   Thought Process: linear, goal directed, no circumstantial or tangential thought process noted, no racing thoughts or flight  of ideas Descriptions of Associations: intact Thought Content: no bizarre content, logical and future-oriented Hallucinations: denies AH, VH , does not appear responding to stimuli Delusions: no paranoia, delusions of control, grandeur, ideas of reference, thought broadcasting, and magical thinking Suicidal Thoughts: denies SI, intention, plan  Homicidal Thoughts: denies HI, intention, plan   Alertness/Orientation: alert and fully oriented  Insight: fair- has regret regarding SI prior to admission Judgment: fair - open to staying to monitor for mood and medication  Memory: intact  Executive Functions  Concentration: intact  Attention Span: fair Recall: intact Fund of Knowledge: fair     Assets  Assets:Desire for Improvement; Financial Resources/Insurance; Housing; Leisure Time; Physical Health; Resilience; Talents/Skills; Transportation; Vocational/Educational  Physical Exam Constitutional:      Appearance: Normal appearance.  Cardiovascular:     Rate and Rhythm: Normal rate.  Pulmonary:     Effort: Pulmonary effort is normal.  Neurological:     General: No focal deficit present.     Mental Status: Alert and oriented to person, place, and time.    Review of Systems  Constitutional: Negative.  Negative for chills, fever and weight loss.  HENT: Negative.    Eyes: Negative.   Respiratory: Negative.    Cardiovascular: Negative.   Gastrointestinal:  Negative for constipation, diarrhea, nausea and vomiting.  Genitourinary: Negative.   Musculoskeletal: Negative.   Skin: Negative.   Neurological: Negative.  Negative for tingling.     Blood pressure 94/63, pulse 86, temperature 97.7 F (36.5 C), temperature source Oral, resp. rate 18, height 5' (1.524 m), weight (P) 47.6 kg, SpO2 100 %. Body mass index is 20.51 kg/m (pended).   ASSESSMENT:  Diagnoses / Active Problems: Principal Problem: MDD (major depressive disorder), recurrent episode, severe ([place]) Diagnosis:  Principal Problem:   MDD (major depressive disorder), recurrent episode, severe ([facility]) R/o borderline personality disorder R/o social anxiety disorder  PLAN: Safety and Monitoring:  -- Voluntary admission to inpatient psychiatric unit for safety, stabilization and treatment  -- Daily contact with patient to assess and evaluate symptoms and progress in treatment  -- Patient's case to be discussed in multi-disciplinary team meeting  --  Observation Level : q15 minute checks  -- Vital signs:  q12 hours  -- Precautions: suicide, elopement, and assault  -- Continue documenting a food log  2. Medications:   -Continue gabapentin 600 mg 3 times daily for anxiety   - Monitor patient's anxiety regarding eating and can consider changing to having food delivered to her room due to high anxiety eating in public space -Continue Lamictal 50 mg in the morning and 100 mg at night x3 days (last time is 8/29) followed by increase of Lamictal to 100 mg twice daily (starting 8/30) for mood stabilization              -Patient had previously been on higher dose and denies significant side effects from lamotrigine.  Denies any skin rashes from dose adjustments of Lamictal -Continue Remeron 7.5 mg nightly for depression and anxiety with added benefits of sedation at night and appetite stimulation             --Monitor for rebound mania/mood instability -Continue Abilify 10 mg qd as a mood stabilizer -Start Lithium 300 mg BID for chronic suicidal ideation and as a mood stabilizer. Discussed with patient regarding side effects and patient is amenable to medication  -- TSH and CMP WNL except elevated glucose and decreased AST  -- will check Lithium levels on 9/1            -- Metabolic profile and EKG monitoring obtained while on an atypical antipsychotic BMI: 20.51 Lipid Panel: HDL 29 HbgA1c: 4.6 (low) QTc: 425 -- The risks/benefits/side-effects/alternatives to this medication were discussed in detail with the patient  and time was given for questions. The patient consents to medication trial.              -- Encouraged patient to participate in unit milieu and in scheduled group therapies         3. Pertinent labs CMP: elevated glucose 136, AST 13 Lipid: HDL 29 CBC: WNL A1c: 4.6 (low) TSH: WNL (0.519) Free T4 WNL (0.81)     Lab ordered: Lithium level ordered for Friday morning   4. Tobacco Use Disorder -- Nicorette gum 4 mg q4h while awake for NRT -- Allergic to adhesive in nicotine patch  -- Smoking cessation encouraged  5. Group and Therapy: -- Encouraged patient to participate in unit milieu and in scheduled group therapies     -- Short Term Goals: Ability to identify changes in lifestyle to reduce recurrence of condition will improve, Ability to verbalize feelings will improve, Ability to disclose and discuss suicidal ideas, Ability to demonstrate self-control will improve, Ability to identify and develop effective coping behaviors will improve, Ability to maintain clinical measurements within normal limits will improve, Compliance with prescribed medications will improve, and Ability to identify triggers associated with substance abuse/mental health issues will improve  -- Long Term Goals: Improvement in symptoms so as ready for discharge  6. Discharge Planning:   -- Social work and case management to assist with discharge planning and identification of hospital follow-up needs prior to discharge  -- Estimated LOS: 5-7 days  -- Discharge Concerns: Need to establish a safety plan; Medication compliance and effectiveness  -- Discharge Goals: Return home with outpatient referrals for mental health follow-up including medication management/psychotherapy      Total Time Spent in Direct Patient Care:  I personally spent 30 minutes on the unit in direct patient care. The direct patient care time included face-to-face time with the patient, reviewing the patient's chart, communicating  with other  professionals, and coordinating care. Greater than 50% of this time was spent in counseling or coordinating care with the patient regarding goals of hospitalization, psycho-education, and discharge planning needs.   Stormy Fabian, MD 07/03/2022, 9:12 AM

## 2022-07-04 ENCOUNTER — Encounter (HOSPITAL_COMMUNITY): Payer: Self-pay | Admitting: Psychiatry

## 2022-07-04 DIAGNOSIS — F332 Major depressive disorder, recurrent severe without psychotic features: Secondary | ICD-10-CM | POA: Diagnosis not present

## 2022-07-04 MED ORDER — MELATONIN 5 MG PO TABS: 5.0000 mg | ORAL_TABLET | Freq: Every evening | ORAL | Status: DC | PRN

## 2022-07-04 NOTE — Progress Notes (Signed)
[facility] MD Progress Note  07/04/2022 8:51 AM Karen [name]  MRN:  [id number]   Reason for Admission:  Karen [name] is a 31 y.o. female with psychiatric hx of type I bipolar disorder, PTSD, substance abuse who presented to [facility] on 07/01/2022 voluntarily with worsening depression and active SI with plan.  The patient is currently on Hospital Day 3.   Chart Review from last 24 hours:  The patient's chart was reviewed and nursing notes were reviewed. The patient's case was discussed in multidisciplinary team meeting. Per [facility], patient was taking medications appropriately. Per nursing, patient is cooperative and attended 2 group sessions. No PRN medications were given.   Information Obtained Today During Patient Interview: The patient was seen and evaluated on the unit . On assessment, the patient feels "good" today. Patient reports having good sleep and appetite with no issues on bowel movement, nausea, vomiting, and abdominal pain. She feels that the medications have been helpful and denies adverse effects such as stiffness, restlessness. When asked about low mood, patient reports it has been improving. When the plan regarding medication monitoring was discussed, the patient started becoming upset regarding having to stay to get her lithium level checked. She initially noted that she was gaining benefit from staying but once the plan was being discussed, she says "I should not have come here. When I come here, I typically only stay for three days. I want to say forget the medication if it means I can leave here". We informed the patient that we added the medication only yesterday and monitoring is needed. She continues to be upset when the benefits of the medications were explained to her. While she denies SI, HI, AVH, delusions, paranoia, thought broadcasting, and ideas of reference, patient's judgement regarding staying for two more days is limited. Yesterday, patient was agreeable to staying until  she obtained a lithium level in the hospital. Patient is agreeable to obtaining an EKG today.              Principal Problem: MDD (major depressive disorder), recurrent episode, severe ([place]) Diagnosis: Principal Problem:   MDD (major depressive disorder), recurrent episode, severe ([place])    Past Psychiatric History:  Previous Psych Diagnoses: Type I bipolar disorder, PTSD, substance abuse Prior inpatient treatment: [facility] 2013, [facility] 11/2019 Current/prior outpatient treatment: Lamictal 100 mg twice daily, Abilify 5 mg daily, gabapentin 600 mg 3 times daily, Wellbutrin XL 150 mg daily Psychotherapy hx: Alvera Singh regularly History of suicide: 3 attempts History of homicide: denies Psychiatric medication history: trazodone (hypotension and dizziness), Zoloft, Prozac, Lexapro, Prozac, lithium, Tegretol Psychiatric medication compliance history: fair Neuromodulation history: IV ketamine (open to consideration for Spravato) Current Psychiatrist: Ileene Musa Current therapist: Alvera Singh  Past Medical History:  Past Medical History:  Diagnosis Date   Anxiety    Bipolar disorder ([place])    Chronic UTI    Depression    Endometriosis    HSV (herpes simplex virus) anogenital infection    PTSD (post-traumatic stress disorder)     Past Surgical History:  Procedure Laterality Date   CESAREAN SECTION N/A 02/17/2018   Procedure: CESAREAN SECTION;  Surgeon: Sanjuana Kava, MD;  Location: [facility];  Service: Obstetrics;  Laterality: N/A;   COLONOSCOPY     DIAGNOSTIC LAPAROSCOPY  11/05/2012   DILATION AND EVACUATION N/A 03/11/2015   Procedure: DILATATION AND EVACUATION;  Surgeon: Waymon Amato, MD;  Location: [facility];  Service: Gynecology;  Laterality: N/A;   DRUG INDUCED ENDOSCOPY  LAPAROSCOPY     LAPAROSCOPY N/A 06/07/2021   Procedure: LAPAROSCOPY OPERATIVE with /EXCISION OF LESIONS and lysis of adhesions;  Surgeon: Waymon Amato, MD;  Location: [facility];  Service: Gynecology;   Laterality: N/A;   TONSILLECTOMY  11/05/2013   Family History:  Family History  Problem Relation Age of Onset   Rheum arthritis Mother    Cancer Father    Family History: Medical: grandfather that died from dementia Psych: paternal aunt with schizoaffective disorder, sister with OCD, maternal aunt with bipolar disorder, mom with bipolar disorder, paternal grandfather with schizophrenia Psych Rx: unknown SA/HA: unknown  Social History: Social History: Marital Status: divorced Children: 46 yo son Employment: physician substitute Trauma: endorses, but unwilling to discuss (open to discussing with therapist) Housing: lives in a condo Finances/Disability: employed  Scientist, research (physical sciences) difficulties: denies Nature conservation officer: denies  Sleep:  Fair  Appetite:  Fair  Current Medications: Current Facility-Administered Medications  Medication Dose Route Frequency Provider Last Rate Last Admin   acetaminophen (TYLENOL) tablet 650 mg  650 mg Oral Q6H PRN Leevy-Johnson, Brooke A, NP   650 mg at 07/01/22 1647   alum & mag hydroxide-simeth (MAALOX/MYLANTA) 200-200-20 MG/5ML suspension 30 mL  30 mL Oral Q4H PRN Leevy-Johnson, Brooke A, NP       ARIPiprazole (ABILIFY) tablet 10 mg  10 mg Oral Daily France Ravens, MD   10 mg at 07/04/22 1696   gabapentin (NEURONTIN) capsule 600 mg  600 mg Oral TID Leevy-Johnson, Brooke A, NP   600 mg at 07/04/22 7893   hydrOXYzine (ATARAX) tablet 25 mg  25 mg Oral TID PRN France Ravens, MD       lamoTRIgine (LAMICTAL) tablet 100 mg  100 mg Oral BID Stormy Fabian, MD   100 mg at 07/04/22 8101   lithium carbonate capsule 300 mg  300 mg Oral BID WC Stormy Fabian, MD   300 mg at 07/04/22 0753   magnesium hydroxide (MILK OF MAGNESIA) suspension 30 mL  30 mL Oral Daily PRN Leevy-Johnson, Brooke A, NP       melatonin tablet 5 mg  5 mg Oral QHS PRN Stormy Fabian, MD       mirtazapine (REMERON) tablet 7.5 mg  7.5 mg Oral QHS France Ravens, MD   7.5 mg at 07/03/22 2110   nicotine polacrilex  (NICORETTE) gum 4 mg  4 mg Oral Q4H while awake France Ravens, MD   4 mg at 07/04/22 7510    Lab Results:  Results for orders placed or performed during the hospital encounter of 07/01/22 (from the past 48 hour(s))  T4, free     Status: None   Collection Time: 07/02/22  6:12 PM  Result Value Ref Range   Free T4 0.81 0.61 - 1.12 ng/dL    Comment: (NOTE) Biotin ingestion may interfere with free T4 tests. If the results are inconsistent with the TSH level, previous test results, or the clinical presentation, then consider biotin interference. If needed, order repeat testing after stopping biotin. Performed at [facility], [place] [address] [place], [place] [address]     Blood Alcohol level:  Lab Results  Component Value Date   [facility] <10 09/11/2020   ETH <10 77/82/4235    Metabolic Disorder Labs: Lab Results  Component Value Date   HGBA1C 4.6 (L) 07/02/2022   MPG 85.32 07/02/2022   MPG 85.32 09/11/2020   No results found for: "PROLACTIN" Lab Results  Component Value Date   CHOL 117 07/02/2022   TRIG 94 07/02/2022  HDL 29 (L) 07/02/2022   CHOLHDL 4.0 07/02/2022   VLDL 19 07/02/2022   LDLCALC 69 07/02/2022   LDLCALC 78 05/09/2021    Musculoskeletal: Strength & Muscle Tone: within normal limits Gait & Station: normal Patient leans: N/A  Psychiatric Specialty Exam:  General Appearance: appears at stated age, casually dressed and groomed  Behavior: irritable  Psychomotor Activity: no psychomotor agitation or retardation noted   Eye Contact: good Speech: normal amount, tone, volume and fluency   Mood: dysphoric Affect: congruent, uncooperative  Thought Process: linear, goal directed, no circumstantial or tangential thought process noted, no racing thoughts or flight of ideas Descriptions of Associations: intact Thought Content: no bizarre content, logical and future-oriented Hallucinations: denies AH, VH , does not appear responding to stimuli Delusions: no  paranoia, delusions of control, grandeur, ideas of reference, thought broadcasting, and magical thinking Suicidal Thoughts: denies SI, intention, plan  Homicidal Thoughts: denies HI, intention, plan   Alertness/Orientation: alert and fully oriented  Insight: limited, feels her mood is stable but patient started crying twice when we told her the plan to obtain lithium level in two days Judgment: limited, close minded regarding staying in the hospital  Memory: intact  Executive Functions  Concentration: intact  Attention Span: fair Recall: intact Fund of Knowledge: fair      Assets  Assets:Desire for Improvement; Financial Resources/Insurance; Housing; Leisure Time; Physical Health; Resilience; Talents/Skills; Transportation; Vocational/Educational  Physical Exam Constitutional:      Appearance: Normal appearance.  Cardiovascular:     Rate and Rhythm: Normal rate.  Pulmonary:     Effort: Pulmonary effort is normal.  Neurological:     General: No focal deficit present.     Mental Status: Alert and oriented to person, place, and time.    Review of Systems  Constitutional: Negative.  Negative for chills, fever and weight loss.  HENT: Negative.    Eyes: Negative.   Respiratory: Negative.    Cardiovascular: Negative.   Gastrointestinal:  Negative for constipation, diarrhea, nausea and vomiting.  Genitourinary: Negative.   Musculoskeletal: Negative.   Skin: Negative.   Neurological: Negative.  Negative for tingling.     Blood pressure 92/68, pulse 100, temperature 98 F (36.7 C), temperature source Oral, resp. rate 16, height 5' (1.524 m), weight (P) 47.6 kg, SpO2 99 %. Body mass index is 20.51 kg/m (pended).   ASSESSMENT:  Diagnoses / Active Problems: Principal Problem: MDD (major depressive disorder), recurrent episode, severe ([place]) Diagnosis: Principal Problem:   MDD (major depressive disorder), recurrent episode, severe ([facility]) R/o borderline personality  disorder R/o social anxiety disorder  PLAN: Safety and Monitoring:  -- Voluntary admission to inpatient psychiatric unit for safety, stabilization and treatment  -- Daily contact with patient to assess and evaluate symptoms and progress in treatment  -- Patient's case to be discussed in multi-disciplinary team meeting  -- Observation Level : q15 minute checks  -- Vital signs:  q12 hours  -- Precautions: suicide, elopement, and assault  -- Continue documenting a food log  2. Medications:   -Continue gabapentin 600 mg 3 times daily for anxiety   - Monitor patient's anxiety regarding eating and can consider changing to having food delivered to her room due to high anxiety eating in public space -Continue Lamictal 50 mg in the morning and 100 mg at night x3 days (last time is 8/29) followed by increase of Lamictal to 100 mg twice daily (starting 8/30) for mood stabilization              -  Patient had previously been on higher dose and denies significant side effects from lamotrigine.  Denies any skin rashes from dose adjustments of Lamictal -Continue Remeron 7.5 mg nightly for depression and anxiety with added benefits of sedation at night and appetite stimulation             --Monitor for rebound mania/mood instability -Continue Abilify 10 mg qd as a mood stabilizer -Continue Lithium 300 mg BID for chronic suicidal ideation and as a mood stabilizer. Discussed with patient regarding side effects and patient is amenable to medication  -- TSH and CMP WNL except elevated glucose and decreased AST, EKG pending  -- will check Lithium levels on 9/1            -- Metabolic profile and EKG monitoring obtained while on an atypical antipsychotic BMI: 20.51 Lipid Panel: HDL 29 HbgA1c: 4.6 (low) QTc: 425 -- The risks/benefits/side-effects/alternatives to this medication were discussed in detail with the patient and time was given for questions. The patient consents to medication trial.              --  Encouraged patient to participate in unit milieu and in scheduled group therapies     3. Pertinent labs CMP: elevated glucose 136, AST 13 Lipid: HDL 29 CBC: WNL A1c: 4.6 (low) TSH: WNL (0.519) Free T4 WNL (0.81)     Lab ordered: Lithium level ordered for Friday morning, EKG   4. Tobacco Use Disorder -- Nicorette gum 4 mg q4h while awake for NRT -- Allergic to adhesive in nicotine patch  -- Smoking cessation encouraged  5. Group and Therapy: -- Encouraged patient to participate in unit milieu and in scheduled group therapies     -- Short Term Goals: Ability to identify changes in lifestyle to reduce recurrence of condition will improve, Ability to verbalize feelings will improve, Ability to disclose and discuss suicidal ideas, Ability to demonstrate self-control will improve, Ability to identify and develop effective coping behaviors will improve, Ability to maintain clinical measurements within normal limits will improve, Compliance with prescribed medications will improve, and Ability to identify triggers associated with substance abuse/mental health issues will improve  -- Long Term Goals: Improvement in symptoms so as ready for discharge  6. Discharge Planning:   -- Social work and case management to assist with discharge planning and identification of hospital follow-up needs prior to discharge  -- Estimated LOS: 5-7 days  -- Discharge Concerns: Need to establish a safety plan; Medication compliance and effectiveness  -- Discharge Goals: Return home with outpatient referrals for mental health follow-up including medication management/psychotherapy      Total Time Spent in Direct Patient Care:  I personally spent 30 minutes on the unit in direct patient care. The direct patient care time included face-to-face time with the patient, reviewing the patient's chart, communicating with other professionals, and coordinating care. Greater than 50% of this time was spent in counseling or  coordinating care with the patient regarding goals of hospitalization, psycho-education, and discharge planning needs.   Stormy Fabian, MD 07/04/2022, 8:51 AM

## 2022-07-04 NOTE — Group Note (Signed)
[name] Group Therapy Note   Group Date: 07/04/2022 Start Time: 1300 End Time: 1400  Type of Therapy and Topic:  Group Therapy - Who Am I?  Participation Level:  Active   Description of Group The focus of this group was to aid patients in self-exploration and awareness. Patients were guided in exploring various factors of oneself to include interests, readiness to change, management of emotions, and individual perception of self. Patients were provided with complementary worksheets exploring hidden talents, ease of asking other for help, music/media preferences, understanding and responding to feelings/emotions, and hope for the future. At group closing, patients were encouraged to adhere to discharge plan to assist in continued self-exploration and understanding.  Therapeutic Goals Patients learned that self-exploration and awareness is an ongoing process Patients identified their individual skills, preferences, and abilities Patients explored their openness to establish and confide in supports Patients explored their readiness for change and progression of mental health   Summary of Patient Progress:  Patient actively engaged in introductory check-in. Patient actively engaged in activity of self-exploration and identification, completing complementary worksheet to assist in discussion. Patient identified various factors ranging from hidden talents, favorite music and movies, trusted individuals, accountability, and individual perceptions of self and hope. Pt engaged in processing thoughts and feelings as well as means of reframing thoughts. Pt proved receptive of alternate group members input and feedback from [place].   Therapeutic Modalities Cognitive Behavioral Therapy Motivational Interviewing  Carie Caddy, [place] 07/04/2022  2:18 PM

## 2022-07-04 NOTE — Plan of Care (Signed)
  Problem: Coping: Goal: Coping ability will improve Outcome: Progressing   Problem: Health Behavior/Discharge Planning: Goal: Ability to make decisions will improve Outcome: Progressing   Problem: Health Behavior/Discharge Planning: Goal: Compliance with therapeutic regimen will improve Outcome: Progressing   Problem: Role Relationship: Goal: Will demonstrate positive changes in social behaviors and relationships Outcome: Progressing

## 2022-07-04 NOTE — Progress Notes (Signed)
Pt refused melatonin. Pt stated she did not need medication and thought she spoke to provider about discontinuing it.   Pt took her other bedtime medication and went to sleep.

## 2022-07-04 NOTE — Group Note (Signed)
Recreation Therapy Group Note   Group Topic:Stress Management  Group Date: 07/04/2022 Start Time: 0930 End Time: 0958 Facilitators: Victorino Sparrow, [place] Location: [address] Day[address]   Goal Area(s) Addresses:  Patient will identify positive stress management techniques. Patient will identify benefits of using stress management post d/c.  Group Description: Quiet Time.  Patients were allowed to watch a video that was relaxing, calming and allowed them the opportunity to clear their minds and escape from their daily activities.   Affect/Mood: Appropriate   Participation Level: Engaged   Participation Quality: Independent   Behavior: Appropriate   Speech/Thought Process: Focused   Insight: Good   Judgement: Good   Modes of Intervention: Meditation   Patient Response to Interventions:  Engaged   Education Outcome:  Acknowledges education and In group clarification offered    Clinical Observations/Individualized Feedback: Pt attended and participated in group session.     Plan: Continue to engage patient in RT group sessions 2-3x/week.   Victorino Sparrow, Glennis Brink 07/04/2022 12:23 PM

## 2022-07-04 NOTE — Progress Notes (Signed)
   07/04/22 0901  Psych Admission Type (Psych Patients Only)  Admission Status Voluntary  Psychosocial Assessment  Patient Complaints None  Eye Contact Fair  Facial Expression Flat  Affect Flat  Speech Logical/coherent  Interaction Assertive  Motor Activity Other (Comment) (WNL)  Appearance/Hygiene Unremarkable  Behavior Characteristics Cooperative  Mood Euthymic;Pleasant  Thought Process  Coherency WDL  Content WDL  Delusions None reported or observed  Perception WDL  Hallucination None reported or observed  Judgment Limited  Confusion None  Danger to Self  Current suicidal ideation? Denies  Agreement Not to Harm Self Yes  Description of Agreement verbally contracts for safety  Danger to Others  Danger to Others None reported or observed

## 2022-07-05 DIAGNOSIS — F332 Major depressive disorder, recurrent severe without psychotic features: Secondary | ICD-10-CM

## 2022-07-05 MED ORDER — MEDROXYPROGESTERONE ACETATE 150 MG/ML IM SUSP
150.0000 mg | Freq: Once | INTRAMUSCULAR | Status: AC
  Administered 2022-07-05: 150 mg via INTRAMUSCULAR
  Filled 2022-07-05: qty 1

## 2022-07-05 NOTE — Progress Notes (Signed)
   07/05/22 2100  Psych Admission Type (Psych Patients Only)  Admission Status Voluntary  Psychosocial Assessment  Patient Complaints None  Eye Contact Fair  Facial Expression Animated  Affect Appropriate to circumstance  Speech Logical/coherent  Interaction Assertive  Motor Activity Slow  Appearance/Hygiene Unremarkable  Behavior Characteristics Appropriate to situation;Cooperative  Mood Pleasant  Thought Process  Coherency WDL  Content WDL  Delusions None reported or observed  Perception WDL  Hallucination None reported or observed  Judgment WDL  Confusion None  Danger to Self  Current suicidal ideation? Denies  Agreement Not to Harm Self Yes  Description of Agreement Verbal  Danger to Others  Danger to Others None reported or observed

## 2022-07-05 NOTE — Progress Notes (Signed)
[facility] MD Progress Note  07/05/2022 1:10 PM Karen [name]  MRN:  [id number]   Reason for Admission:  Karen [name] is a 31 y.o. female with psychiatric hx of type I bipolar disorder, PTSD, substance abuse who presented to [facility] on 07/01/2022 voluntarily with worsening depression and active SI with plan.  The patient is currently on Hospital Day 4.   Chart Review from last 24 hours:  The patient's chart was reviewed and nursing notes were reviewed. The patient's case was discussed in multidisciplinary team meeting. Per [facility], patient was taking medications appropriately. Per nursing, patient is calm and cooperative and attended 2 group sessions. The following PRN medications were given: tylenol 1x, atarax 1x   Information Obtained Today During Patient Interview: The patient was seen and evaluated on the unit . On assessment, the patient feels "okay" today. Patient reports having good sleep and appetite with no issues on bowel movement, nausea, vomiting, and abdominal pain. She feels that the medications have been helpful and denies adverse effects such as dizziness, tremor, drowsiness, and HA. She continues noting frustration regarding staying in the hospital and notes feeling trapped. It was reiterated that we needed to monitor her for medication side effects and check the lithium level prior to her being discharged. She was understanding that we will check her lithium level tomorrow. Denies SI, HI, AVH, delusions, paranoia, thought broadcasting, and ideas of reference. Patient missed her OBGYN appointment on 8/29 and she was supposed to receive her Depo-provera shot so patient was agreeable to receiving the shot today.            Contacted patient's neighbor Caryl Pina [phone]) whom the pt is close with and she feels comfortable with the patient being discharged tomorrow and notes the patient has strong support in her life.   Principal Problem: MDD (major depressive disorder), recurrent  episode, severe ([place]) Diagnosis: Principal Problem:   MDD (major depressive disorder), recurrent episode, severe ([place])    Past Psychiatric History:  Previous Psych Diagnoses: Type I bipolar disorder, PTSD, substance abuse Prior inpatient treatment: [facility] 2013, [facility] 11/2019 Current/prior outpatient treatment: Lamictal 100 mg twice daily, Abilify 5 mg daily, gabapentin 600 mg 3 times daily, Wellbutrin XL 150 mg daily Psychotherapy hx: Alvera Singh regularly History of suicide: 3 attempts History of homicide: denies Psychiatric medication history: trazodone (hypotension and dizziness), Zoloft, Prozac, Lexapro, Prozac, lithium, Tegretol Psychiatric medication compliance history: fair Neuromodulation history: IV ketamine (open to consideration for Spravato) Current Psychiatrist: Ileene Musa Current therapist: Alvera Singh  Past Medical History:  Past Medical History:  Diagnosis Date   Anxiety    Bipolar disorder ([place])    Chronic UTI    Depression    Endometriosis    HSV (herpes simplex virus) anogenital infection    PTSD (post-traumatic stress disorder)     Past Surgical History:  Procedure Laterality Date   CESAREAN SECTION N/A 02/17/2018   Procedure: CESAREAN SECTION;  Surgeon: Sanjuana Kava, MD;  Location: [place];  Service: Obstetrics;  Laterality: N/A;   COLONOSCOPY     DIAGNOSTIC LAPAROSCOPY  11/05/2012   DILATION AND EVACUATION N/A 03/11/2015   Procedure: DILATATION AND EVACUATION;  Surgeon: Waymon Amato, MD;  Location: [facility];  Service: Gynecology;  Laterality: N/A;   DRUG INDUCED ENDOSCOPY     LAPAROSCOPY     LAPAROSCOPY N/A 06/07/2021   Procedure: LAPAROSCOPY OPERATIVE with /EXCISION OF LESIONS and lysis of adhesions;  Surgeon: Waymon Amato, MD;  Location: [place];  Service: Gynecology;  Laterality: N/A;   TONSILLECTOMY  11/05/2013   Family History:  Family History  Problem Relation Age of Onset   Rheum arthritis Mother    Cancer Father    Family  History: Medical: grandfather that died from dementia Psych: paternal aunt with schizoaffective disorder, sister with OCD, maternal aunt with bipolar disorder, mom with bipolar disorder, paternal grandfather with schizophrenia Psych Rx: unknown SA/HA: unknown  Social History: Social History: Marital Status: divorced Children: 7 yo son Employment: physician substitute Trauma: endorses, but unwilling to discuss (open to discussing with therapist) Housing: lives in a condo Finances/Disability: employed  Scientist, research (physical sciences) difficulties: denies Nature conservation officer: denies  Sleep:  good  Appetite: good   Current Medications: Current Facility-Administered Medications  Medication Dose Route Frequency Provider Last Rate Last Admin   acetaminophen (TYLENOL) tablet 650 mg  650 mg Oral Q6H PRN Leevy-Johnson, Brooke A, NP   650 mg at 07/04/22 2121   alum & mag hydroxide-simeth (MAALOX/MYLANTA) 200-200-20 MG/5ML suspension 30 mL  30 mL Oral Q4H PRN Leevy-Johnson, Brooke A, NP       ARIPiprazole (ABILIFY) tablet 10 mg  10 mg Oral Daily France Ravens, MD   10 mg at 07/05/22 0731   gabapentin (NEURONTIN) capsule 600 mg  600 mg Oral TID Leevy-Johnson, Brooke A, NP   600 mg at 07/05/22 1202   hydrOXYzine (ATARAX) tablet 25 mg  25 mg Oral TID PRN France Ravens, MD   25 mg at 07/05/22 1231   lamoTRIgine (LAMICTAL) tablet 100 mg  100 mg Oral BID Stormy Fabian, MD   100 mg at 07/05/22 0730   lithium carbonate capsule 300 mg  300 mg Oral BID WC Stormy Fabian, MD   300 mg at 07/05/22 0731   magnesium hydroxide (MILK OF MAGNESIA) suspension 30 mL  30 mL Oral Daily PRN Leevy-Johnson, Brooke A, NP       melatonin tablet 5 mg  5 mg Oral QHS PRN Stormy Fabian, MD       mirtazapine (REMERON) tablet 7.5 mg  7.5 mg Oral QHS France Ravens, MD   7.5 mg at 07/04/22 2118   nicotine polacrilex (NICORETTE) gum 4 mg  4 mg Oral Q4H while awake France Ravens, MD   4 mg at 07/05/22 1002    Lab Results:  No results found for this or any previous visit  (from the past 48 hour(s)).   Blood Alcohol level:  Lab Results  Component Value Date   ETH <10 09/11/2020   ETH <10 40/08/2724    Metabolic Disorder Labs: Lab Results  Component Value Date   HGBA1C 4.6 (L) 07/02/2022   MPG 85.32 07/02/2022   MPG 85.32 09/11/2020   No results found for: "PROLACTIN" Lab Results  Component Value Date   CHOL 117 07/02/2022   TRIG 94 07/02/2022   HDL 29 (L) 07/02/2022   CHOLHDL 4.0 07/02/2022   VLDL 19 07/02/2022   LDLCALC 69 07/02/2022   LDLCALC 78 05/09/2021    Musculoskeletal: Strength & Muscle Tone: within normal limits Gait & Station: normal Patient leans: N/A  Psychiatric Specialty Exam: General Appearance: appears at stated age, casually dressed and groomed  Behavior: irritable, less agitated compared to yesterday  Psychomotor Activity: no psychomotor agitation or retardation noted   Eye Contact: good Speech: normal amount, tone, volume and fluency   Mood: dysphoric Affect: congruent  Thought Process: linear, goal directed, no circumstantial or tangential thought process noted, no racing thoughts or flight of ideas Descriptions of Associations: intact Thought Content: no bizarre  content, logical and future-oriented Hallucinations: denies AH, VH , does not appear responding to stimuli Delusions: no paranoia, delusions of control, grandeur, ideas of reference, thought broadcasting, and magical thinking Suicidal Thoughts: denies SI, intention, plan  Homicidal Thoughts: denies HI, intention, plan   Alertness/Orientation: alert and fully oriented  Insight: fair Judgment: fair  Memory: intact  Executive Functions  Concentration: intact  Attention Span: fair Recall: intact Fund of Knowledge: fair       Assets  Assets:Desire for Improvement; Financial Resources/Insurance; Housing; Leisure Time; Physical Health; Resilience; Talents/Skills; Transportation; Vocational/Educational  Physical Exam Constitutional:       Appearance: Normal appearance.  Cardiovascular:     Rate and Rhythm: Normal rate.  Pulmonary:     Effort: Pulmonary effort is normal.  Neurological:     General: No focal deficit present.     Mental Status: Alert and oriented to person, place, and time.    Review of Systems  Constitutional: Negative.  Negative for chills, fever and weight loss.  HENT: Negative.    Eyes: Negative.   Respiratory: Negative.    Cardiovascular: Negative.   Gastrointestinal:  Negative for constipation, diarrhea, nausea and vomiting.  Genitourinary: Negative.   Musculoskeletal: Negative.   Skin: Negative.   Neurological: Negative.  Negative for tingling.       Blood pressure 102/67, pulse 90, temperature 98.2 F (36.8 C), temperature source Oral, resp. rate 16, height 5' (1.524 m), weight (P) 47.6 kg, SpO2 100 %. Body mass index is 20.51 kg/m (pended).   ASSESSMENT:  Diagnoses / Active Problems: Principal Problem: MDD (major depressive disorder), recurrent episode, severe ([place]) Diagnosis: Principal Problem:   MDD (major depressive disorder), recurrent episode, severe (H[facility]) R/o borderline personality disorder R/o social anxiety disorder  PLAN: Safety and Monitoring:  -- Voluntary admission to inpatient psychiatric unit for safety, stabilization and treatment  -- Daily contact with patient to assess and evaluate symptoms and progress in treatment  -- Patient's case to be discussed in multi-disciplinary team meeting  -- Observation Level : q15 minute checks  -- Vital signs:  q12 hours  -- Precautions: suicide, elopement, and assault  -- Continue documenting a food log  2. Medications:   -Continue gabapentin 600 mg 3 times daily for anxiety   - Monitor patient's anxiety regarding eating and can consider changing to having food delivered to her room due to high anxiety eating in public space -Continue Lamictal 50 mg in the morning and 100 mg at night x3 days (last time is 8/29) followed by  increase of Lamictal to 100 mg twice daily (starting 8/30) for mood stabilization              -Patient had previously been on higher dose and denies significant side effects from lamotrigine.  Denies any skin rashes from dose adjustments of Lamictal -Continue Remeron 7.5 mg nightly for depression and anxiety with added benefits of sedation at night and appetite stimulation             --Monitor for rebound mania/mood instability -Continue Abilify 10 mg qd as a mood stabilizer -Continue Lithium 300 mg BID for chronic suicidal ideation and as a mood stabilizer. Discussed with patient regarding side effects and patient is amenable to medication  -- TSH and CMP WNL except elevated glucose and decreased AST, EKG WNL  -- will check Lithium levels on 9/1            -- Metabolic profile and EKG monitoring obtained while on an atypical antipsychotic  BMI: 20.51 Lipid Panel: HDL 29 HbgA1c: 4.6 (low) QTc: 425 -- The risks/benefits/side-effects/alternatives to this medication were discussed in detail with the patient and time was given for questions. The patient consents to medication trial.              -- Encouraged patient to participate in unit milieu and in scheduled group therapies    -- Given Depo-Provera 150 mg IM today for contraception- patient was due for her shot on 8/29   3. Pertinent labs CMP: elevated glucose 136, AST 13 Lipid: HDL 29 CBC: WNL A1c: 4.6 (low) TSH: WNL (0.519) Free T4 WNL (0.81) EKG: Qtc is 420     Lab ordered: Lithium level ordered for Friday morning   4. Tobacco Use Disorder -- Nicorette gum 4 mg q4h while awake for NRT -- Allergic to adhesive in nicotine patch  -- Smoking cessation encouraged  5. Group and Therapy: -- Encouraged patient to participate in unit milieu and in scheduled group therapies     -- Short Term Goals: Ability to identify changes in lifestyle to reduce recurrence of condition will improve, Ability to verbalize feelings will improve, Ability  to disclose and discuss suicidal ideas, Ability to demonstrate self-control will improve, Ability to identify and develop effective coping behaviors will improve, Ability to maintain clinical measurements within normal limits will improve, Compliance with prescribed medications will improve, and Ability to identify triggers associated with substance abuse/mental health issues will improve  -- Long Term Goals: Improvement in symptoms so as ready for discharge  6. Discharge Planning:   -- Social work and case management to assist with discharge planning and identification of hospital follow-up needs prior to discharge  -- Estimated LOS: 5-7 days  -- Discharge Concerns: Need to establish a safety plan; Medication compliance and effectiveness  -- Discharge Goals: Return home with outpatient referrals for mental health follow-up including medication management/psychotherapy      Total Time Spent in Direct Patient Care:  I personally spent 30 minutes on the unit in direct patient care. The direct patient care time included face-to-face time with the patient, reviewing the patient's chart, communicating with other professionals, and coordinating care. Greater than 50% of this time was spent in counseling or coordinating care with the patient regarding goals of hospitalization, psycho-education, and discharge planning needs.   Stormy Fabian, MD 07/05/2022, 1:10 PM

## 2022-07-05 NOTE — Progress Notes (Signed)
D:  Patient's self inventory sheet, patient has fair sleep.  Good appetite, normal energy level, good concentration.  Denied depression and anxiety.  Denied withdrawals.  Denied SI.  Denied physical pain.  Goal is discharge home.  Plans to take lithium level blood work tomorrow.  Wants to discharge home. A:  Medications administered per MD orders.  Emotional support and  encouragement given patient. R:  Denied SI and HI, contracts for safety.   Denied A/V hallucinations.  Safety maintained with 15 minute checks.

## 2022-07-05 NOTE — BHH Group Notes (Signed)
Adult Psychoeducational Group Note  Date:  07/05/2022 Time:  9:14 PM  Group Topic/Focus:  Wrap-Up Group:   The focus of this group is to help patients review their daily goal of treatment and discuss progress on daily workbooks.  Participation Level:  Active  Participation Quality:  Appropriate  Affect:  Appropriate  Cognitive:  Appropriate  Insight: Appropriate  Engagement in Group:  Engaged  Modes of Intervention:  Education  Additional Comments:  Pt goal today was not to cuss anybody out and fix my attitude to the positive.Pt wants to work on tomorrow being nice to the doctor.  [place] Paiz, Georgiann Mccoy 07/05/2022, 9:14 PM

## 2022-07-05 NOTE — Plan of Care (Signed)
Nurse discussed anxiety, depression and coping skills.

## 2022-07-05 NOTE — BHH Group Notes (Signed)
[facility] Group Notes:  (Nursing/MHT/Case Management/Adjunct)  Date:  07/05/2022  Time:  5:15 PM  Type of Therapy:  Group Therapy  Participation Level:  Active  Participation Quality:  Appropriate  Affect:  Appropriate  Cognitive:  Appropriate  Insight:  Appropriate  Engagement in Group:  Engaged  Modes of Intervention:  Discussion  Summary of Progress/Problems:  Patient attended and participated in goals group.   Karen [name] 07/05/2022, 5:15 PM

## 2022-07-06 ENCOUNTER — Other Ambulatory Visit: Payer: Self-pay

## 2022-07-06 ENCOUNTER — Encounter (HOSPITAL_COMMUNITY): Payer: Self-pay

## 2022-07-06 LAB — BASIC METABOLIC PANEL
Anion gap: 4 — ABNORMAL LOW (ref 5–15)
BUN: 10 mg/dL (ref 6–20)
CO2: 27 mmol/L (ref 22–32)
Calcium: 8.8 mg/dL — ABNORMAL LOW (ref 8.9–10.3)
Chloride: 108 mmol/L (ref 98–111)
Creatinine, Ser: 0.65 mg/dL (ref 0.44–1.00)
GFR, Estimated: >60 mL/min (ref >60)
Glucose, Bld: 100 mg/dL — ABNORMAL HIGH (ref 70–99)
Potassium: 4 mmol/L (ref 3.5–5.1)
Sodium: 139 mmol/L (ref 135–145)

## 2022-07-06 LAB — LITHIUM LEVEL: Lithium Lvl: 0.27 mmol/L — ABNORMAL LOW (ref 0.60–1.20)

## 2022-07-06 MED ORDER — MIRTAZAPINE 7.5 MG PO TABS
7.5000 mg | ORAL_TABLET | Freq: Every day | ORAL | 0 refills | Status: DC
Start: 2022-07-06 — End: 2022-07-18
  Filled 2022-07-06: qty 30, 30d supply, fill #0

## 2022-07-06 MED ORDER — ARIPIPRAZOLE 10 MG PO TABS
10.0000 mg | ORAL_TABLET | Freq: Every day | ORAL | 0 refills | Status: DC
  Filled 2022-07-06: qty 30, 30d supply, fill #0

## 2022-07-06 MED ORDER — GABAPENTIN 300 MG PO CAPS
600.0000 mg | ORAL_CAPSULE | Freq: Three times a day (TID) | ORAL | 0 refills | Status: DC
  Filled 2022-07-06: qty 180, 30d supply, fill #0

## 2022-07-06 MED ORDER — LAMOTRIGINE 100 MG PO TABS
100.0000 mg | ORAL_TABLET | Freq: Two times a day (BID) | ORAL | 0 refills | Status: DC
  Filled 2022-07-06: qty 60, 30d supply, fill #0

## 2022-07-06 MED ORDER — HYDROXYZINE HCL 25 MG PO TABS
25.0000 mg | ORAL_TABLET | Freq: Three times a day (TID) | ORAL | 0 refills | Status: DC | PRN
  Filled 2022-07-06: qty 45, 15d supply, fill #0

## 2022-07-06 MED ORDER — LITHIUM CARBONATE 300 MG PO CAPS
300.0000 mg | ORAL_CAPSULE | Freq: Two times a day (BID) | ORAL | 0 refills | Status: DC
  Filled 2022-07-06: qty 60, 30d supply, fill #0

## 2022-07-06 NOTE — Progress Notes (Signed)
Pt discharged to lobby. Pt was stable and appreciative at that time. All papers, suicide prevention plan and prescriptions were given and valuables returned. Verbal understanding expressed. Denies SI/HI and A/VH. Pt given opportunity to express concerns and ask questions.

## 2022-07-06 NOTE — BHH Suicide Risk Assessment (Signed)
[facility] Discharge Suicide Risk Assessment   Principal Problem: MDD (major depressive disorder), recurrent episode, severe ([place]) Discharge Diagnoses: Principal Problem:   MDD (major depressive disorder), recurrent episode, severe ([place])   Reason for Admission: Suicidal Ideation and progressive depression  Hospital Summary During the patient's hospitalization, patient had extensive initial psychiatric evaluation, and follow-up psychiatric evaluations every day.   Psychiatric diagnoses provided upon initial assessment: Bipolar Disorder   Patient's psychiatric medications were adjusted to: -Started Lithium and titrated to 300 mg bid             -Lithium level subtherapeutic 0.27             -Recommend outpatient psychiatry to titrate to therapeutic level -Resumed Lamictal 150 mg total daily and increased to home dosage of 100 mg bid -Received depo-provera shot 07/05/22     Patient's care was discussed during the interdisciplinary team meeting every day during the hospitalization.   The patient denies having side effects to prescribed psychiatric medication.   Gradually, patient started adjusting to milieu. The patient was evaluated each day by a clinical provider to ascertain response to treatment. Improvement was noted by the patient's report of decreasing symptoms, improved sleep and appetite, affect, medication tolerance, behavior, and participation in unit programming.  Patient was asked each day to complete a self inventory noting mood, mental status, pain, new symptoms, anxiety and concerns.     Symptoms were reported as significantly decreased or resolved completely by discharge.    On day of discharge, the patient reports that their mood is stable. The patient denied having suicidal thoughts for more than 48 hours prior to discharge.  Patient denies having homicidal thoughts.  Patient denies having auditory hallucinations.  Patient denies any visual hallucinations or other symptoms of  psychosis. The patient was motivated to continue taking medication with a goal of continued improvement in mental health.    The patient reports their target psychiatric symptoms of depression responded well to the psychiatric medications, and the patient reports overall benefit other psychiatric hospitalization. Supportive psychotherapy was provided to the patient. The patient also participated in regular group therapy while hospitalized. Coping skills, problem solving as well as relaxation therapies were also part of the unit programming.   Labs were reviewed with the patient, and abnormal results were discussed with the patient.   The patient is able to verbalize their individual safety plan to this provider.   # It is recommended to the patient to continue psychiatric medications as prescribed, after discharge from the hospital.     # It is recommended to the patient to follow up with your outpatient psychiatric provider and PCP.   # It was discussed with the patient, the impact of alcohol, drugs, tobacco have been there overall psychiatric and medical wellbeing, and total abstinence from substance use was recommended the patient.ed.   # Prescriptions provided or sent directly to preferred pharmacy at discharge. Patient agreeable to plan. Given opportunity to ask questions. Appears to feel comfortable with discharge.    # In the event of worsening symptoms, the patient is instructed to call the crisis hotline, 911 and or go to the nearest ED for appropriate evaluation and treatment of symptoms. To follow-up with primary care provider for other medical issues, concerns and or health care needs   # Patient was discharged home with a plan to follow up as noted below.   Total Time spent with patient: 45 minutes  Musculoskeletal: Strength & Muscle Tone: within normal limits  Gait & Station: normal Patient leans: N/A  Psychiatric Specialty Exam  Presentation  General Appearance:  Appropriate for Environment; Casual   Eye Contact:Good   Speech:Clear and Coherent; Normal Rate   Speech Volume:Normal   Handedness:No data recorded   Mood and Affect  Mood:Euthymic   Duration of Depression Symptoms: Greater than two weeks   Affect:Appropriate    Thought Process  Thought Processes:Coherent; Goal Directed; Linear   Descriptions of Associations:Intact   Orientation:Full (Time, Place and Person)   Thought Content:Logical   History of Schizophrenia/Schizoaffective disorder:No   Duration of Psychotic Symptoms:No data recorded  Hallucinations:Hallucinations: None  Ideas of Reference:None   Suicidal Thoughts:Suicidal Thoughts: No  Homicidal Thoughts:Homicidal Thoughts: No   Sensorium  Memory:Immediate Good; Recent Good; Remote Good   Judgment:Good   Insight:Good    Executive Functions  Concentration:Good   Attention Span:Good   [place] of Knowledge:Good   Language:Good    Psychomotor Activity  Psychomotor Activity:Psychomotor Activity: Normal   Assets  Assets:Communication Skills; Desire for Improvement; Financial Resources/Insurance; Housing; Leisure Time; Physical Health; Resilience; Talents/Skills; Transportation; Vocational/Educational    Sleep  Sleep:Sleep: Good   Physical Exam: Physical Exam Vitals and nursing note reviewed.  Constitutional:      Appearance: Normal appearance. She is normal weight.  HENT:     Head: Normocephalic and atraumatic.  Pulmonary:     Effort: Pulmonary effort is normal.  Neurological:     General: No focal deficit present.     Mental Status: She is oriented to person, place, and time.    Review of Systems  Respiratory:  Negative for shortness of breath.   Cardiovascular:  Negative for chest pain.  Gastrointestinal:  Negative for abdominal pain, constipation, diarrhea, heartburn, nausea and vomiting.  Neurological:  Negative for headaches.   Blood  pressure 90/61, pulse 87, temperature 98.2 F (36.8 C), temperature source Oral, resp. rate 16, height 5' (1.524 m), weight (P) 47.6 kg, SpO2 100 %. Body mass index is 20.51 kg/m (pended).  Mental Status Per Nursing Assessment::   On Admission:  Suicide plan  Demographic Factors:  Caucasian  Loss Factors: NA  Historical Factors: NA  Risk Reduction Factors:   Responsible for children under 45 years of age, Sense of responsibility to family, Employed, Living with another person, especially a relative, Positive social support, Positive therapeutic relationship, and Positive coping skills or problem solving skills  Continued Clinical Symptoms:  Bipolar Disorder:   Depressive phase  Cognitive Features That Contribute To Risk:  None    Suicide Risk:  Mild:  Suicidal ideation of limited frequency, intensity, duration, and specificity.  There are no identifiable plans, no associated intent, mild dysphoria and related symptoms, good self-control (both objective and subjective assessment), few other risk factors, and identifiable protective factors, including available and accessible social support.   [place]. Go on 07/17/2022.   Specialty: Behavioral Health Why: You have an appointment for medication management services on 07/17/22 at 3:30 pm.  You also have an appointment on 07/17/22 at 4:00 pm for therapy services. Contact information: [facility] [phone]                Plan Of Care/Follow-up recommendations:  Activity: as tolerated  Diet: heart healthy  Other: -Follow-up with your outpatient psychiatric provider -instructions on appointment date, time, and address (location) are provided to you in discharge paperwork.  -Take your psychiatric medications as prescribed at  discharge - instructions are provided to you in the discharge paperwork  -Recommend abstinence from  alcohol, tobacco, and other illicit drug use at discharge.   -If your psychiatric symptoms recur, worsen, or if you have side effects to your psychiatric medications, call your outpatient psychiatric provider, 911, [phone] or go to the nearest emergency department.  -If suicidal thoughts recur, call your outpatient psychiatric provider, 911, [phone] or go to the nearest emergency department.   France Ravens, MD 07/06/2022, 10:43 AM

## 2022-07-06 NOTE — BH IP Treatment Plan (Signed)
Interdisciplinary Treatment and Diagnostic Plan Update  07/06/2022 Time of Session: 10:15am  Karen [name] MRN: [id number]  Principal Diagnosis: MDD (major depressive disorder), recurrent episode, severe ([place])  Secondary Diagnoses: Principal Problem:   MDD (major depressive disorder), recurrent episode, severe ([place])   Current Medications:  Current Facility-Administered Medications  Medication Dose Route Frequency Provider Last Rate Last Admin   acetaminophen (TYLENOL) tablet 650 mg  650 mg Oral Q6H PRN Leevy-Johnson, Brooke A, NP   650 mg at 07/04/22 2121   alum & mag hydroxide-simeth (MAALOX/MYLANTA) 200-200-20 MG/5ML suspension 30 mL  30 mL Oral Q4H PRN Leevy-Johnson, Brooke A, NP       ARIPiprazole (ABILIFY) tablet 10 mg  10 mg Oral Daily France Ravens, MD   10 mg at 07/06/22 8242   gabapentin (NEURONTIN) capsule 600 mg  600 mg Oral TID Leevy-Johnson, Brooke A, NP   600 mg at 07/06/22 3536   hydrOXYzine (ATARAX) tablet 25 mg  25 mg Oral TID PRN France Ravens, MD   25 mg at 07/05/22 1231   lamoTRIgine (LAMICTAL) tablet 100 mg  100 mg Oral BID Stormy Fabian, MD   100 mg at 07/06/22 1443   lithium carbonate capsule 300 mg  300 mg Oral BID WC Stormy Fabian, MD   300 mg at 07/06/22 1540   magnesium hydroxide (MILK OF MAGNESIA) suspension 30 mL  30 mL Oral Daily PRN Leevy-Johnson, Brooke A, NP       melatonin tablet 5 mg  5 mg Oral QHS PRN Stormy Fabian, MD       mirtazapine (REMERON) tablet 7.5 mg  7.5 mg Oral QHS France Ravens, MD   7.5 mg at 07/05/22 2046   nicotine polacrilex (NICORETTE) gum 4 mg  4 mg Oral Q4H while awake France Ravens, MD   4 mg at 07/06/22 1025   Current Outpatient Medications  Medication Sig Dispense Refill   acetaminophen (TYLENOL) 325 MG tablet Take 2 tablets (650 mg total) by mouth every 6 (six) hours as needed for mild pain or moderate pain. 30 tablet 1   medroxyPROGESTERone (DEPO-PROVERA) 150 MG/ML injection Inject 150 mg into the muscle every 3 (three) months. Next  dose due 07/03/22     Multiple Vitamins-Minerals (MULTIVITAMIN WITH MINERALS) tablet Take 1 tablet by mouth daily.     ARIPiprazole (ABILIFY) 10 MG tablet Take 1 tablet (10 mg total) by mouth daily. 30 tablet 0   gabapentin (NEURONTIN) 300 MG capsule Take 2 capsules (600 mg total) by mouth 3 (three) times daily. 180 capsule 0   hydrOXYzine (ATARAX) 25 MG tablet Take 1 tablet (25 mg total) by mouth 3 (three) times daily as needed for anxiety. 45 tablet 0   lamoTRIgine (LAMICTAL) 100 MG tablet Take 1 tablet (100 mg total) by mouth 2 (two) times daily. 60 tablet 0   lithium carbonate 300 MG capsule Take 1 capsule (300 mg total) by mouth 2 (two) times daily with a meal. 60 capsule 0   mirtazapine (REMERON) 7.5 MG tablet Take 1 tablet (7.5 mg total) by mouth at bedtime. 30 tablet 0   PTA Medications: No medications prior to admission.    Patient Stressors: Loss of father terminally ill in hospice   Medication change or noncompliance    Patient Strengths: Ability for insight  Active sense of humor  Average or above average intelligence  Capable of independent living   Treatment Modalities: Medication Management, Group therapy, Case management,  1 to 1 session with clinician, Psychoeducation, Recreational therapy.  Physician Treatment Plan for Primary Diagnosis: MDD (major depressive disorder), recurrent episode, severe ([place]) Long Term Goal(s): Improvement in symptoms so as ready for discharge   Short Term Goals: Ability to identify changes in lifestyle to reduce recurrence of condition will improve Ability to verbalize feelings will improve Ability to disclose and discuss suicidal ideas Ability to demonstrate self-control will improve Ability to identify and develop effective coping behaviors will improve Ability to maintain clinical measurements within normal limits will improve Compliance with prescribed medications will improve Ability to identify triggers associated with substance  abuse/mental health issues will improve  Medication Management: Evaluate patient's response, side effects, and tolerance of medication regimen.  Therapeutic Interventions: 1 to 1 sessions, Unit Group sessions and Medication administration.  Evaluation of Outcomes: Adequate for Discharge  Physician Treatment Plan for Secondary Diagnosis: Principal Problem:   MDD (major depressive disorder), recurrent episode, severe ([place])  Long Term Goal(s): Improvement in symptoms so as ready for discharge   Short Term Goals: Ability to identify changes in lifestyle to reduce recurrence of condition will improve Ability to verbalize feelings will improve Ability to disclose and discuss suicidal ideas Ability to demonstrate self-control will improve Ability to identify and develop effective coping behaviors will improve Ability to maintain clinical measurements within normal limits will improve Compliance with prescribed medications will improve Ability to identify triggers associated with substance abuse/mental health issues will improve     Medication Management: Evaluate patient's response, side effects, and tolerance of medication regimen.  Therapeutic Interventions: 1 to 1 sessions, Unit Group sessions and Medication administration.  Evaluation of Outcomes: Adequate for Discharge   RN Treatment Plan for Primary Diagnosis: MDD (major depressive disorder), recurrent episode, severe ([place]) Long Term Goal(s): Knowledge of disease and therapeutic regimen to maintain health will improve  Short Term Goals: Ability to remain free from injury will improve, Ability to participate in decision making will improve, Ability to verbalize feelings will improve, Ability to disclose and discuss suicidal ideas, and Ability to identify and develop effective coping behaviors will improve  Medication Management: RN will administer medications as ordered by provider, will assess and evaluate patient's response and  provide education to patient for prescribed medication. RN will report any adverse and/or side effects to prescribing provider.  Therapeutic Interventions: 1 on 1 counseling sessions, Psychoeducation, Medication administration, Evaluate responses to treatment, Monitor vital signs and CBGs as ordered, Perform/monitor CIWA, COWS, AIMS and Fall Risk screenings as ordered, Perform wound care treatments as ordered.  Evaluation of Outcomes: Adequate for Discharge   [name] Treatment Plan for Primary Diagnosis: MDD (major depressive disorder), recurrent episode, severe ([place]) Long Term Goal(s): Safe transition to appropriate next level of care at discharge, Engage patient in therapeutic group addressing interpersonal concerns.  Short Term Goals: Engage patient in aftercare planning with referrals and resources, Increase social support, Increase emotional regulation, Facilitate acceptance of mental health diagnosis and concerns, Identify triggers associated with mental health/substance abuse issues, and Increase skills for wellness and recovery  Therapeutic Interventions: Assess for all discharge needs, 1 to 1 time with Social worker, Explore available resources and support systems, Assess for adequacy in community support network, Educate family and significant other(s) on suicide prevention, Complete Psychosocial Assessment, Interpersonal group therapy.  Evaluation of Outcomes: Adequate for Discharge   Progress in Treatment: Attending groups: No. Participating in groups: No. Taking medication as prescribed: Yes. Toleration medication: Yes. Family/Significant other contact made: No, will contact:  Patient has declined consent for CSW to reach family/friend.  Patient understands  diagnosis: Yes. Discussing patient identified problems/goals with staff: Yes. Medical problems stabilized or resolved: Yes. Denies suicidal/homicidal ideation: No. Issues/concerns per patient self-inventory: Yes. Other:  none   New problem(s) identified: No, Describe:  none   New Short Term/Long Term Goal(s):Patient to work towards medication management for mood stabilization; elimination of SI thoughts; development of comprehensive mental wellness plan.   Patient Goals:  Patient states their goal for treatment is to "get into a better routine with planning out life."   Discharge Plan or Barriers: Pt will return home and follow up with [facility] for therapy and medication management    Reason for Continuation of Hospitalization: Medication management    Estimated Length of Stay: Adequate for Discharge     Last [place] Suicide Severity Risk Score: Flowsheet Row Admission (Discharged) from OP Visit from 07/01/2022 in [facility] Counselor from 03/07/2022 in [facility] Video Visit from 03/06/2022 in [place] High Risk Low Risk Low Risk       Last [facility] 2/9 Scores:    03/07/2022    9:10 AM 03/06/2022    9:10 AM 01/02/2022    9:09 AM  Depression screen PHQ 2/9  Decreased Interest '2 1 1  '$ Down, Depressed, Hopeless '1 1 1  '$ PHQ - 2 Score '3 2 2  '$ Altered sleeping 3 2 0  Tired, decreased energy '3 2 1  '$ Change in appetite 2 1 0  Feeling bad or failure about yourself  '1 1 1  '$ Trouble concentrating '1 1 2  '$ Moving slowly or fidgety/restless 1 2 0  Suicidal thoughts 0 0 0  PHQ-9 Score '14 11 6  '$ Difficult doing work/chores Somewhat difficult Somewhat difficult Somewhat difficult    Scribe for Treatment Team: Darleen Crocker, Latanya Presser 07/06/2022 2:23 PM

## 2022-07-06 NOTE — Discharge Summary (Signed)
Physician Discharge Summary Note  Patient:  Karen [name] is an 31 y.o., female MRN:  [id number] DOB:  [birth year] Patient phone:  [phone] (home)  Patient address:   [address] [place] [address],  Total Time spent with patient: 45 minutes  Date of Admission:  07/01/2022 Date of Discharge: 07/06/2022  Reason for Admission:  Karen [name] is a 31 y.o. female with psychiatric hx of type I bipolar disorder, PTSD, substance abuse who presented to [facility] on 07/01/2022 voluntarily with worsening depression and active SI with plan.   Work note to return to work Tuesday 07/10/22.   Principal Problem: MDD (major depressive disorder), recurrent episode, severe ([place]) Discharge Diagnoses: Principal Problem:   MDD (major depressive disorder), recurrent episode, severe ([place])    Past Psychiatric History:  Previous Psych Diagnoses: Type I bipolar disorder, PTSD, substance abuse Prior inpatient treatment: Karen [name] 2013, [facility] 11/2019 Current/prior outpatient treatment: Lamictal 100 mg twice daily, Abilify 5 mg daily, gabapentin 600 mg 3 times daily, Wellbutrin XL 150 mg daily Psychotherapy hx: Alvera Singh regularly History of suicide: 3 attempts History of homicide: denies Psychiatric medication history: trazodone (hypotension and dizziness), Zoloft, Prozac, Lexapro, Prozac, lithium, Tegretol Psychiatric medication compliance history: fair Neuromodulation history: IV ketamine (open to consideration for Spravato) Current Psychiatrist: Ileene Musa Current therapist: Alvera Singh  Past Medical History:  Past Medical History:  Diagnosis Date   Anxiety    Bipolar disorder ([place])    Chronic UTI    Depression    Endometriosis    HSV (herpes simplex virus) anogenital infection    PTSD (post-traumatic stress disorder)     Past Surgical History:  Procedure Laterality Date   CESAREAN SECTION N/A 02/17/2018   Procedure: CESAREAN SECTION;  Surgeon: Sanjuana Kava, MD;   Location: [place];  Service: Obstetrics;  Laterality: N/A;   COLONOSCOPY     DIAGNOSTIC LAPAROSCOPY  11/05/2012   DILATION AND EVACUATION N/A 03/11/2015   Procedure: DILATATION AND EVACUATION;  Surgeon: Waymon Amato, MD;  Location: [facility];  Service: Gynecology;  Laterality: N/A;   DRUG INDUCED ENDOSCOPY     LAPAROSCOPY     LAPAROSCOPY N/A 06/07/2021   Procedure: LAPAROSCOPY OPERATIVE with /EXCISION OF LESIONS and lysis of adhesions;  Surgeon: Waymon Amato, MD;  Location: [place];  Service: Gynecology;  Laterality: N/A;   TONSILLECTOMY  11/05/2013   Family History:  Family History  Problem Relation Age of Onset   Rheum arthritis Mother    Cancer Father    Family Psychiatric  History: unknown Social History:  Social History   Substance and Sexual Activity  Alcohol Use Not Currently     Social History   Substance and Sexual Activity  Drug Use Not Currently   Types: Heroin, Cocaine, Marijuana   Comment: Clean since Aug 2015    Social History   Socioeconomic History   Marital status: Married    Spouse name: Magdaline Zollars   Number of children: 1   Years of education: Not on file   Highest education level: Some college, no degree  Occupational History   Not on file  Tobacco Use   Smoking status: Every Day    Types: E-cigarettes   Smokeless tobacco: Never   Tobacco comments:    PT VAPES DAILY, '' I vape a lot ''   Vaping Use   Vaping Use: Every day   Substances: Nicotine, Flavoring  Substance and Sexual Activity   Alcohol use: Not Currently   Drug use:  Not Currently    Types: Heroin, Cocaine, Marijuana    Comment: Clean since Aug 2015   Sexual activity: Yes    Partners: Male  Other Topics Concern   Not on file  Social History Narrative   Not on file   Social Determinants of Health   Financial Resource Strain: Medium Risk (03/07/2022)   Overall Financial Resource Strain (CARDIA)    Difficulty of Paying Living Expenses: Somewhat hard  Food Insecurity: No Food  Insecurity (03/07/2022)   Hunger Vital Sign    Worried About Running Out of Food in the Last Year: Never true    Ran Out of Food in the Last Year: Never true  Transportation Needs: No Transportation Needs (03/07/2022)   PRAPARE - Hydrologist (Medical): No    Lack of Transportation (Non-Medical): No  Physical Activity: Sufficiently Active (03/07/2022)   Exercise Vital Sign    Days of Exercise per Week: 3 days    Minutes of Exercise per Session: 60 min  Stress: Stress Concern Present (03/07/2022)   [place]    Feeling of Stress : Rather much  Social Connections: Moderately Isolated (03/07/2022)   Social Connection and Isolation Panel [NHANES]    Frequency of Communication with Friends and Family: More than three times a week    Frequency of Social Gatherings with Friends and Family: More than three times a week    Attends Religious Services: Never    Marine scientist or Organizations: Yes    Attends Music therapist: More than 4 times per year    Marital Status: Separated    Hospital Course:   HOSPITAL COURSE:  During the patient's hospitalization, patient had extensive initial psychiatric evaluation, and follow-up psychiatric evaluations every day.  Psychiatric diagnoses provided upon initial assessment: Bipolar Disorder  Patient's psychiatric medications were adjusted to: -Started Lithium and titrated to 300 mg bid  -Lithium level subtherapeutic 0.27  -Recommend outpatient psychiatry to titrate to therapeutic level -Resumed Lamictal 150 mg total daily and increased to home dosage of 100 mg bid -Received depo-provera shot 07/05/22   Patient's care was discussed during the interdisciplinary team meeting every day during the hospitalization.  The patient denies having side effects to prescribed psychiatric medication.  Gradually, patient started adjusting to milieu. The  patient was evaluated each day by a clinical provider to ascertain response to treatment. Improvement was noted by the patient's report of decreasing symptoms, improved sleep and appetite, affect, medication tolerance, behavior, and participation in unit programming.  Patient was asked each day to complete a self inventory noting mood, mental status, pain, new symptoms, anxiety and concerns.    Symptoms were reported as significantly decreased or resolved completely by discharge.   On day of discharge, the patient reports that their mood is stable. The patient denied having suicidal thoughts for more than 48 hours prior to discharge.  Patient denies having homicidal thoughts.  Patient denies having auditory hallucinations.  Patient denies any visual hallucinations or other symptoms of psychosis. The patient was motivated to continue taking medication with a goal of continued improvement in mental health.   The patient reports their target psychiatric symptoms of depression responded well to the psychiatric medications, and the patient reports overall benefit other psychiatric hospitalization. Supportive psychotherapy was provided to the patient. The patient also participated in regular group therapy while hospitalized. Coping skills, problem solving as well as relaxation therapies were also part  of the unit programming.  Labs were reviewed with the patient, and abnormal results were discussed with the patient.  The patient is able to verbalize their individual safety plan to this provider.  # It is recommended to the patient to continue psychiatric medications as prescribed, after discharge from the hospital.    # It is recommended to the patient to follow up with your outpatient psychiatric provider and PCP.  # It was discussed with the patient, the impact of alcohol, drugs, tobacco have been there overall psychiatric and medical wellbeing, and total abstinence from substance use was recommended the  patient.ed.  # Prescriptions provided or sent directly to preferred pharmacy at discharge. Patient agreeable to plan. Given opportunity to ask questions. Appears to feel comfortable with discharge.    # In the event of worsening symptoms, the patient is instructed to call the crisis hotline, 911 and or go to the nearest ED for appropriate evaluation and treatment of symptoms. To follow-up with primary care provider for other medical issues, concerns and or health care needs  # Patient was discharged home with a plan to follow up as noted below.  Physical Findings: AIMS:  Facial and Oral Movements Muscles of Facial Expression: None, normal Lips and Perioral Area: None, normal Jaw: None, normal Tongue: None, normal, Extremity Movements Upper (arms, wrists, hands, fingers): None, normal Lower (legs, knees, ankles, toes): None, normal,  Trunk Movements Neck, shoulders, hips: None, normal,  Overall Severity Severity of abnormal movements (highest score from questions above): None, normal Incapacitation due to abnormal movements: None, normal Patient's awareness of abnormal movements (rate only patient's report): No Awareness,  Dental Status Current problems with teeth and/or dentures?: No Does patient usually wear dentures?: No  CIWA:    COWS:     Musculoskeletal: Strength & Muscle Tone: within normal limits Gait & Station: normal Patient leans: N/A   Psychiatric Specialty Exam:  Presentation  General Appearance: Appropriate for Environment; Casual   Eye Contact:Good   Speech:Clear and Coherent; Normal Rate   Speech Volume:Normal   Handedness:No data recorded   Mood and Affect  Mood:Euthymic   Affect:Appropriate    Thought Process  Thought Processes:Coherent; Goal Directed; Linear   Descriptions of Associations:Intact   Orientation:Full (Time, Place and Person)   Thought Content:Logical   History of Schizophrenia/Schizoaffective  disorder:No   Duration of Psychotic Symptoms:No data recorded  Hallucinations:Hallucinations: None   Ideas of Reference:None   Suicidal Thoughts:Suicidal Thoughts: No   Homicidal Thoughts:Homicidal Thoughts: No    Sensorium  Memory:Immediate Good; Recent Good; Remote Good   Judgment:Good   Insight:Good    Executive Functions  Concentration:Good   Attention Span:Good   [place] of Knowledge:Good   Language:Good    Psychomotor Activity  Psychomotor Activity:Psychomotor Activity: Normal   Assets  Assets:Communication Skills; Desire for Improvement; Financial Resources/Insurance; Housing; Leisure Time; Physical Health; Resilience; Talents/Skills; Transportation; Vocational/Educational    Sleep  Sleep:Sleep: Good    Physical Exam: Physical Exam Vitals and nursing note reviewed.  Constitutional:      Appearance: Normal appearance. She is normal weight.  HENT:     Head: Normocephalic and atraumatic.  Pulmonary:     Effort: Pulmonary effort is normal.  Neurological:     General: No focal deficit present.     Mental Status: She is oriented to person, place, and time.    Review of Systems  Respiratory:  Negative for shortness of breath.   Cardiovascular:  Negative for chest pain.  Gastrointestinal:  Negative for abdominal pain, constipation, diarrhea, heartburn, nausea and vomiting.  Neurological:  Negative for headaches.   Blood pressure 90/61, pulse 87, temperature 98.2 F (36.8 C), temperature source Oral, resp. rate 16, height 5' (1.524 m), weight (P) 47.6 kg, SpO2 100 %. Body mass index is 20.51 kg/m (pended).   Social History   Tobacco Use  Smoking Status Every Day   Types: E-cigarettes  Smokeless Tobacco Never  Tobacco Comments   PT VAPES DAILY, '' I vape a lot ''    Tobacco Cessation:  A prescription for an FDA-approved tobacco cessation medication was offered at discharge and the patient refused   Blood Alcohol  level:  Lab Results  Component Value Date   [facility] <10 09/11/2020   ETH <10 19/41/7408    Metabolic Disorder Labs:  Lab Results  Component Value Date   HGBA1C 4.6 (L) 07/02/2022   MPG 85.32 07/02/2022   MPG 85.32 09/11/2020   No results found for: "PROLACTIN" Lab Results  Component Value Date   CHOL 117 07/02/2022   TRIG 94 07/02/2022   HDL 29 (L) 07/02/2022   CHOLHDL 4.0 07/02/2022   VLDL 19 07/02/2022   LDLCALC 69 07/02/2022   LDLCALC 78 05/09/2021    See Psychiatric Specialty Exam and Suicide Risk Assessment completed by Attending Physician prior to discharge.  Discharge destination:  Home  Is patient on multiple antipsychotic therapies at discharge:  No   Has Patient had three or more failed trials of antipsychotic monotherapy by history:  No  Recommended Plan for Multiple Antipsychotic Therapies: NA   Allergies as of 07/06/2022       Reactions   Ciprofloxacin Hives   Wound Dressing Adhesive Rash        Medication List     STOP taking these medications    buPROPion 150 MG 24 hr tablet Commonly known as: Wellbutrin XL   ibuprofen 800 MG tablet Commonly known as: ADVIL   valACYclovir 1000 MG tablet Commonly known as: VALTREX       TAKE these medications      Indication  acetaminophen 325 MG tablet Commonly known as: TYLENOL Take 2 tablets (650 mg total) by mouth every 6 (six) hours as needed for mild pain or moderate pain.  Indication: Pain   ARIPiprazole 10 MG tablet Commonly known as: ABILIFY Take 1 tablet (10 mg total) by mouth daily. What changed:  medication strength how much to take  Indication: MIXED BIPOLAR AFFECTIVE DISORDER   gabapentin 300 MG capsule Commonly known as: NEURONTIN Take 2 capsules (600 mg total) by mouth 3 (three) times daily.  Indication: Generalized Anxiety Disorder   hydrOXYzine 25 MG tablet Commonly known as: ATARAX Take 1 tablet (25 mg total) by mouth 3 (three) times daily as needed for anxiety.   Indication: Feeling Anxious   lamoTRIgine 100 MG tablet Commonly known as: LAMICTAL Take 1 tablet (100 mg total) by mouth 2 (two) times daily.  Indication: Manic-Depression   lithium carbonate 300 MG capsule Take 1 capsule (300 mg total) by mouth 2 (two) times daily with a meal.  Indication: Manic-Depression   medroxyPROGESTERone 150 MG/ML injection Commonly known as: DEPO-PROVERA Inject 150 mg into the muscle every 3 (three) months. Next dose due 07/03/22  Indication: Birth Control Treatment   mirtazapine 7.5 MG tablet Commonly known as: REMERON Take 1 tablet (7.5 mg total) by mouth at bedtime.  Indication: Bipolar Affective Disorder   multivitamin with minerals tablet Take 1 tablet by mouth daily.  Indication:  Nutritional Support        Follow-up Information     [facility] Go on 07/17/2022.   Specialty: Behavioral Health Why: You have an appointment for medication management services on 07/17/22 at 3:30 pm.  You also have an appointment on 07/17/22 at 4:00 pm for therapy services. Contact information: [place] [phone]                Follow-up recommendations:   Activity:  as tolerated Diet:  heart healthy   Comments:  Prescriptions were given at discharge.  Patient is agreeable with the discharge plan.  Patient was given an opportunity to ask questions.  Patient appears to feel comfortable with discharge and denies any current suicidal or homicidal thoughts.    Patient is instructed prior to discharge to: Take all medications as prescribed by mental healthcare provider. Report any adverse effects and or reactions from the medicines to outpatient provider promptly. In the event of worsening symptoms, patient is instructed to call the crisis hotline, 911 and or go to the nearest ED for appropriate evaluation and treatment of symptoms. Patient is to follow-up with primary care provider for other  medical issues, concerns and or health care needs.   Signed: France Ravens, MD 07/06/2022, 10:42 AM

## 2022-07-06 NOTE — Progress Notes (Signed)
  [facility] Adult Case Management Discharge Plan :  Will you be returning to the same living situation after discharge:  No. Patient states she will be staying with her father.  At discharge, do you have transportation home?: Yes,  Patient's friend to provide transportation from hospital at 1130AM.  Do you have the ability to pay for your medications: Yes,  [employer].   Release of information consent forms completed and in the chart;  Patient's signature needed at discharge.  Patient to Follow up at:  [facility]. Go on 07/17/2022.   Specialty: Behavioral Health Why: You have an appointment for medication management services on 07/17/22 at 3:30 pm.  You also have an appointment on 07/17/22 at 4:00 pm for therapy services. Contact information: [place] [phone]                Next level of care provider has access to [place] and Suicide Prevention discussed: Yes,  SPE completed with patient, has declined consent for CSW to reach family/friend.  In addition to the precautions below, patient's support agrees to monitor patient for safety, ensure treatment compliance, and alert emergency services should patient require such services.    Has patient been referred to the Quitline?: Patient refused referral Tobacco Use: High Risk (07/04/2022)   Patient History    Smoking Tobacco Use: Every Day    Smokeless Tobacco Use: Never    Passive Exposure: Not on file   Patient has been referred for addiction treatment: N/A Patient denies active substance use, UDS Negative for all, screened low risk during nursing admission (see [facility] quick tab).  Social History   Substance and Sexual Activity  Drug Use Not Currently   Types: Heroin, Cocaine, Marijuana   Comment: Clean since Aug 2015   Social History   Substance and Sexual Activity  Alcohol Use Not Currently    Durenda Hurt, Latanya Presser 07/06/2022, 10:01 AM

## 2022-07-17 ENCOUNTER — Ambulatory Visit (INDEPENDENT_AMBULATORY_CARE_PROVIDER_SITE_OTHER): Payer: No Payment, Other | Admitting: Licensed Clinical Social Worker

## 2022-07-17 ENCOUNTER — Telehealth (INDEPENDENT_AMBULATORY_CARE_PROVIDER_SITE_OTHER): Payer: No Payment, Other | Admitting: Physician Assistant

## 2022-07-17 DIAGNOSIS — F319 Bipolar disorder, unspecified: Secondary | ICD-10-CM

## 2022-07-17 DIAGNOSIS — F4312 Post-traumatic stress disorder, chronic: Secondary | ICD-10-CM | POA: Diagnosis not present

## 2022-07-17 DIAGNOSIS — F419 Anxiety disorder, unspecified: Secondary | ICD-10-CM

## 2022-07-17 DIAGNOSIS — F418 Other specified anxiety disorders: Secondary | ICD-10-CM | POA: Diagnosis not present

## 2022-07-17 NOTE — Progress Notes (Signed)
THERAPIST PROGRESS NOTE  Virtual Visit via Video Note  I connected with Phyliss Hulick on 07/18/22 at  4:00 PM EDT by a video enabled telemedicine application and verified that I am speaking with the correct person using two identifiers.  Location: Patient: [facility]  Provider: Providers Home    I discussed the limitations of evaluation and management by telemedicine and the availability of in person appointments. The patient expressed understanding and agreed to proceed.     I discussed the assessment and treatment plan with the patient. The patient was provided an opportunity to ask questions and all were answered. The patient agreed with the plan and demonstrated an understanding of the instructions.   The patient was advised to call back or seek an in-person evaluation if the symptoms worsen or if the condition fails to improve as anticipated.  I provided 40 minutes of non-face-to-face time during this encounter.   Dory Horn, [name]   Participation Level: Active  Behavioral Response: CasualAlertAnxious and Depressed  Type of Therapy: Individual Therapy  Treatment Goals addressed: walk 3 x weekly or workout  ProgressTowards Goals: Progressing  Interventions: CBT, Motivational Interviewing, and Supportive    Suicidal/Homicidal: Yeswithout intent/plan  Therapist Response:    Patient was alert and oriented x5.  She was pleasant, cooperative, maintained good eye contact.  She engaged well in therapy session was dressed casually.  Patient presented today with depressed and anxious mood\affect.  Patient comes in today stating that she was hospitalized in August 2023.  Patient reports that she has been dishonest with therapist and medication provider about suicidal ideations and poor coping skills such as diet restriction.  Patient reports that she was in the hospital for about 1 week before being discharged.  Patient states that her primary stressors are  mood stabilization, coping skills, ex relationship.  Patient reports prior to her hospitalization she talked to her recent ex-boyfriend who was "doing amazing".  Patient reports that she pushed him to talk about the break-up and mentioned to her poor coping skills such as diet restriction.  Patient reports that her friend took her to be evaluated in an emergency room and eventually was admitted to behavioral health hospital at [place].  Patient endorses mood stabilization but still reports depression and anxiety for tension, worry, restlessness, worthlessness.   Interventions\plan: [name] utilized supportive therapy, CBT therapy, solution focused therapy, and motivational interviewing.  [name] used interventions for open-ended questions, positive affirmations, and reflective listening.  [name] utilized reframing in session.  [name] educated patient on mood stabilization using medication management and taking medications as prescribed.  [name] used person centered therapy for unconditional positive regard using nonjudgmental stances.  [name] used solution focused therapy for utilizing a prioritization list.  [name] explained the difference between short-term and long-term goals.  Plan for patient is to write down goals and objectives she would like to accomplish and divide them up between short-term versus long-term goals.  Patient then to prioritize from highest priority to lowest priority Plan: Return again in 3 weeks.  Diagnosis: Bipolar 1 disorder ([place])  Collaboration of Care: Other none today   Patient/Guardian was advised Release of Information must be obtained prior to any record release in order to collaborate their care with an outside provider. Patient/Guardian was advised if they have not already done so to contact the registration department to sign all necessary forms in order for [place] to release information regarding their care.   Consent: Patient/Guardian gives verbal consent for treatment  and  assignment of benefits for services provided during this visit. Patient/Guardian expressed understanding and agreed to proceed.   Dory Horn, [name] 07/18/2022

## 2022-07-18 ENCOUNTER — Encounter (HOSPITAL_COMMUNITY): Payer: Self-pay | Admitting: Physician Assistant

## 2022-07-18 ENCOUNTER — Other Ambulatory Visit: Payer: Self-pay

## 2022-07-18 MED ORDER — LITHIUM CARBONATE 300 MG PO CAPS
300.0000 mg | ORAL_CAPSULE | Freq: Two times a day (BID) | ORAL | 2 refills | Status: DC
  Filled 2022-07-18 – 2022-08-06 (×2): qty 60, 30d supply, fill #0
  Filled 2022-09-25: qty 60, 30d supply, fill #1

## 2022-07-18 MED ORDER — LAMOTRIGINE 100 MG PO TABS
100.0000 mg | ORAL_TABLET | Freq: Two times a day (BID) | ORAL | 2 refills | Status: DC
  Filled 2022-07-18 – 2022-08-06 (×2): qty 60, 30d supply, fill #0
  Filled 2022-09-25: qty 60, 30d supply, fill #1

## 2022-07-18 MED ORDER — HYDROXYZINE HCL 25 MG PO TABS
25.0000 mg | ORAL_TABLET | Freq: Three times a day (TID) | ORAL | 1 refills | Status: DC | PRN
  Filled 2022-07-18 – 2022-08-06 (×2): qty 75, 25d supply, fill #0
  Filled 2022-09-25: qty 75, 25d supply, fill #1

## 2022-07-18 MED ORDER — GABAPENTIN 300 MG PO CAPS
600.0000 mg | ORAL_CAPSULE | Freq: Three times a day (TID) | ORAL | 2 refills | Status: DC
  Filled 2022-07-18 – 2022-08-06 (×2): qty 180, 30d supply, fill #0
  Filled 2022-09-25: qty 180, 30d supply, fill #1

## 2022-07-18 MED ORDER — MIRTAZAPINE 7.5 MG PO TABS
7.5000 mg | ORAL_TABLET | Freq: Every day | ORAL | 2 refills | Status: DC
  Filled 2022-07-18 – 2022-08-06 (×2): qty 30, 30d supply, fill #0
  Filled 2022-09-25: qty 30, 30d supply, fill #1

## 2022-07-18 MED ORDER — ARIPIPRAZOLE 10 MG PO TABS
10.0000 mg | ORAL_TABLET | Freq: Every day | ORAL | 2 refills | Status: DC
  Filled 2022-07-18 – 2022-08-06 (×2): qty 30, 30d supply, fill #0
  Filled 2022-09-25: qty 30, 30d supply, fill #1

## 2022-07-18 NOTE — Progress Notes (Signed)
[name] MD/PA/NP OP Progress Note  Virtual Visit via Telephone Note  I connected with [name] on 07/18/22 at  3:30 PM EDT by telephone and verified that I am speaking with the correct person using two identifiers.  Location: Patient: Home Provider: Clinic   I discussed the limitations, risks, security and privacy concerns of performing an evaluation and management service by telephone and the availability of in person appointments. I also discussed with the patient that there may be a patient responsible charge related to this service. The patient expressed understanding and agreed to proceed.  Follow Up Instructions:   I discussed the assessment and treatment plan with the patient. The patient was provided an opportunity to ask questions and all were answered. The patient agreed with the plan and demonstrated an understanding of the instructions.   The patient was advised to call back or seek an in-person evaluation if the symptoms worsen or if the condition fails to improve as anticipated.  I provided 14 minutes of non-face-to-face time during this encounter.  [name], PA    07/18/2022 5:17 AM Simrit Gohlke  MRN:  [id number]  Chief Complaint:  No chief complaint on file.  HPI:   Karen [name] is a 31 year old female with a past psychiatric history significant for chronic PTSD, bipolar disorder, and chronic anxiety who presents to [facility] via virtual telephone visit for follow-up of medication management.  Patient was last seen by this provider on 03/06/2022.  During her last encounter, patient was being managed on the following medications:  Bupropion (Wellbutrin XL) 150 mg 24-hour tablet daily Lamotrigine 100 mg 2 times daily Abilify 5 mg daily Gabapentin 600 mg 3 times daily  Patient reports that she was recently admitted to [facility] due to worsening depression.  Patient states that she ended  up at the hospital after not eating or taking care of herself.  Due to not taking care of herself, patient started experiencing muscle spasms and chest pains as well as having thoughts of wanting to hurt herself.  Patient states that her suicidal thoughts were triggered by not eating or taking her medications regularly.  Per chart review, patient was voluntarily admitted to [facility] due to worsening depression and active suicidal ideations with a plan.  Patient was discharged from [facility] on 07/06/2022 with the following medications:  Abilify 10 mg daily Gabapentin 600 mg 3 times daily Hydroxyzine 25 mg 3 times daily as needed Lamotrigine 100 mg 2 times daily Lithium 300 mg 3 times daily Mirtazapine 7.5 mg at bedtime  Patient states that her current medication regimen is helpful.  Patient denies experiencing any adverse side effects from her medications especially her lithium.  Patient states that she is recovering well but states that she is not fully herself since not eating for so long.  Patient denies depressive symptoms and states that she feels mentally better.  Patient endorses anxiety and rates her anxiety as 7 out of 10.  Patient's current stressors involves working a lot and everything involving eating.  A PHQ-9 screen was performed with the patient scoring a 16.  A GAD-7 screen was also performed with the patient scoring a 19.  Patient is alert and oriented x4, calm, cooperative, and fully engaged in conversation during the encounter.  Patient endorses anxious mood.  Patient denies suicidal or homicidal ideations.  She further denies auditory or visual hallucinations and does not appear to be responding to internal/external  stimuli.  Patient endorses good sleep and receives on average 8 hours of sleep each night.  Patient endorses improved appetite and eats on average 2-3 meals per day.  Patient denies alcohol consumption, tobacco use, and illicit drug use.  Visit Diagnosis:     ICD-10-CM   1. Bipolar 1 disorder (HCC)  F31.9 mirtazapine (REMERON) 7.5 MG tablet    lithium carbonate 300 MG capsule    lamoTRIgine (LAMICTAL) 100 MG tablet    ARIPiprazole (ABILIFY) 10 MG tablet    2. Chronic anxiety  F41.9 mirtazapine (REMERON) 7.5 MG tablet    gabapentin (NEURONTIN) 300 MG capsule    hydrOXYzine (ATARAX) 25 MG tablet    3. Chronic post-traumatic stress disorder (PTSD)  F43.12 mirtazapine (REMERON) 7.5 MG tablet      Past Psychiatric History:  Bipolar disorder Generalized anxiety disorder Visual hallucinations Chronic PTSD  Past Medical History:  Past Medical History:  Diagnosis Date   Anxiety    Bipolar disorder ([place])    Chronic UTI    Depression    Endometriosis    HSV (herpes simplex virus) anogenital infection    PTSD (post-traumatic stress disorder)     Past Surgical History:  Procedure Laterality Date   CESAREAN SECTION N/A 02/17/2018   Procedure: CESAREAN SECTION;  Surgeon: Sanjuana Kava, MD;  Location: [facility];  Service: Obstetrics;  Laterality: N/A;   COLONOSCOPY     DIAGNOSTIC LAPAROSCOPY  11/05/2012   DILATION AND EVACUATION N/A 03/11/2015   Procedure: DILATATION AND EVACUATION;  Surgeon: Waymon Amato, MD;  Location: [facility];  Service: Gynecology;  Laterality: N/A;   DRUG INDUCED ENDOSCOPY     LAPAROSCOPY     LAPAROSCOPY N/A 06/07/2021   Procedure: LAPAROSCOPY OPERATIVE with /EXCISION OF LESIONS and lysis of adhesions;  Surgeon: Waymon Amato, MD;  Location: [facility];  Service: Gynecology;  Laterality: N/A;   TONSILLECTOMY  11/05/2013    Family Psychiatric History:  Mother - Major Depressive Disorder Sister - Obsessive Compulsive Disorder Aunt (maternal) - Bipolar I Disorder Uncle (maternal) - Bipolar I Disorder  Family History:  Family History  Problem Relation Age of Onset   Rheum arthritis Mother    Cancer Father     Social History:  Social History   Socioeconomic History   Marital status: Married    Spouse name: Karen [name]   Number of children: 1   Years of education: Not on file   Highest education level: Some college, no degree  Occupational History   Not on file  Tobacco Use   Smoking status: Every Day    Types: E-cigarettes   Smokeless tobacco: Never   Tobacco comments:    PT VAPES DAILY, '' I vape a lot ''   Vaping Use   Vaping Use: Every day   Substances: Nicotine, Flavoring  Substance and Sexual Activity   Alcohol use: Not Currently   Drug use: Not Currently    Types: Heroin, Cocaine, Marijuana    Comment: Clean since Aug 2015   Sexual activity: Yes    Partners: Male  Other Topics Concern   Not on file  Social History Narrative   Not on file   Social Determinants of Health   Financial Resource Strain: Medium Risk (03/07/2022)   Overall Financial Resource Strain (CARDIA)    Difficulty of Paying Living Expenses: Somewhat hard  Food Insecurity: No Food Insecurity (03/07/2022)   Hunger Vital Sign    Worried About Running Out of Food in the Last Year: Never  true    Ran Out of Food in the Last Year: Never true  Transportation Needs: No Transportation Needs (03/07/2022)   PRAPARE - Hydrologist (Medical): No    Lack of Transportation (Non-Medical): No  Physical Activity: Sufficiently Active (03/07/2022)   Exercise Vital Sign    Days of Exercise per Week: 3 days    Minutes of Exercise per Session: 60 min  Stress: Stress Concern Present (03/07/2022)   [place]    Feeling of Stress : Rather much  Social Connections: Moderately Isolated (03/07/2022)   Social Connection and Isolation Panel [NHANES]    Frequency of Communication with Friends and Family: More than three times a week    Frequency of Social Gatherings with Friends and Family: More than three times a week    Attends Religious Services: Never    Marine scientist or Organizations: Yes    Attends Music therapist: More  than 4 times per year    Marital Status: Separated    Allergies:  Allergies  Allergen Reactions   Ciprofloxacin Hives   Wound Dressing Adhesive Rash    Metabolic Disorder Labs: Lab Results  Component Value Date   HGBA1C 4.6 (L) 07/02/2022   MPG 85.32 07/02/2022   MPG 85.32 09/11/2020   No results found for: "PROLACTIN" Lab Results  Component Value Date   CHOL 117 07/02/2022   TRIG 94 07/02/2022   HDL 29 (L) 07/02/2022   CHOLHDL 4.0 07/02/2022   VLDL 19 07/02/2022   LDLCALC 69 07/02/2022   LDLCALC 78 05/09/2021   Lab Results  Component Value Date   TSH 0.519 07/02/2022   TSH 0.784 09/11/2020    Therapeutic Level Labs: Lab Results  Component Value Date   LITHIUM 0.27 (L) 07/06/2022   No results found for: "VALPROATE" No results found for: "CBMZ"  Current Medications: Current Outpatient Medications  Medication Sig Dispense Refill   acetaminophen (TYLENOL) 325 MG tablet Take 2 tablets (650 mg total) by mouth every 6 (six) hours as needed for mild pain or moderate pain. 30 tablet 1   ARIPiprazole (ABILIFY) 10 MG tablet Take 1 tablet (10 mg total) by mouth daily. 30 tablet 2   gabapentin (NEURONTIN) 300 MG capsule Take 2 capsules (600 mg total) by mouth 3 (three) times daily. 180 capsule 2   hydrOXYzine (ATARAX) 25 MG tablet Take 1 tablet (25 mg total) by mouth 3 (three) times daily as needed for anxiety. 75 tablet 1   lamoTRIgine (LAMICTAL) 100 MG tablet Take 1 tablet (100 mg total) by mouth 2 (two) times daily. 60 tablet 2   lithium carbonate 300 MG capsule Take 1 capsule (300 mg total) by mouth 2 (two) times daily with a meal. 60 capsule 2   medroxyPROGESTERone (DEPO-PROVERA) 150 MG/ML injection Inject 150 mg into the muscle every 3 (three) months. Next dose due 07/03/22     mirtazapine (REMERON) 7.5 MG tablet Take 1 tablet (7.5 mg total) by mouth at bedtime. 30 tablet 2   Multiple Vitamins-Minerals (MULTIVITAMIN WITH MINERALS) tablet Take 1 tablet by mouth daily.      No current facility-administered medications for this visit.     Musculoskeletal: Strength & Muscle Tone: Unable to assess due to telemedicine visit [place]: Unable to assess due to telemedicine visit Patient leans: Unable to assess due to telemedicine visit  Psychiatric Specialty Exam: Review of Systems  Psychiatric/Behavioral:  Negative for decreased  concentration, dysphoric mood, hallucinations, self-injury, sleep disturbance and suicidal ideas. The patient is nervous/anxious. The patient is not hyperactive.     There were no vitals taken for this visit.There is no height or weight on file to calculate BMI.  General Appearance: Unable to assess due to telemedicine visit  Eye Contact:  Unable to assess due to telemedicine visit  Speech:  Clear and Coherent and Normal Rate  Volume:  Normal  Mood:  Anxious  Affect:  Congruent  Thought Process:  Coherent and Descriptions of Associations: Intact  Orientation:  Full (Time, Place, and Person)  Thought Content: WDL   Suicidal Thoughts:  No  Homicidal Thoughts:  No  Memory:  Immediate;   Good Recent;   Good Remote;   Good  Judgement:  Good  Insight:  Good  Psychomotor Activity:  Normal  Concentration:  Concentration: Good and Attention Span: Good  Recall:  Good  Fund of Knowledge: Good  Language: Good  Akathisia:  No  Handed:  Ambidextrous  AIMS (if indicated): not done  Assets:  Communication Skills Desire for Improvement Housing Social Support Vocational/Educational  ADL's:  Intact  Cognition: WNL  Sleep:  Fair   Screenings: [place] Admission (Discharged) from OP Visit from 07/01/2022 in [place] [facility] Admission (Discharged) from OP Visit from 09/10/2020 in [place] [facility] Admission (Discharged) from OP Visit from 11/12/2019 in [place] [facility]  AIMS Total Score 0 0 0      AUDIT    Flowsheet Row Admission  (Discharged) from OP Visit from 07/01/2022 in [place] [facility] Admission (Discharged) from OP Visit from 09/10/2020 in [place] [facility] Admission (Discharged) from OP Visit from 11/12/2019 in [facility]  Alcohol Use Disorder Identification Test Final Score (AUDIT) 0 0 0      GAD-7    Flowsheet Row Video Visit from 07/17/2022 in [facility] Counselor from 03/07/2022 in [facility] Video Visit from 03/06/2022 in [facility] Video Visit from 01/02/2022 in [facility] Video Visit from 10/31/2021 in [facility]  Total GAD-7 Score '19 9 8 10 5      '$ PHQ2-9    Flowsheet Row Video Visit from 07/17/2022 in [facility] Counselor from 03/07/2022 in [facility] Video Visit from 03/06/2022 in [facility] Video Visit from 01/02/2022 in [facility] Video Visit from 10/31/2021 in [place]  PHQ-2 Total Score '4 3 2 2 2  '$ PHQ-9 Total Score '16 14 11 6 9      '$ Flowsheet Row Video Visit from 07/17/2022 in [facility] Admission (Discharged) from OP Visit from 07/01/2022 in [place] [facility] Counselor from 03/07/2022 in [place] High Risk High Risk Low Risk        Assessment and Plan:   Karen [name] is a 31 year old female with a past psychiatric history significant for chronic PTSD, bipolar disorder, and chronic anxiety who presents to [facility] via virtual telephone visit for follow-up of medication management.  Patient presents to the clinic recently having been discharged from [facility] after being  admitted for worsening depression and suicidal ideation.  Patient's new medication  regimen has been helpful in managing her symptoms, patient does endorse anxiety.  Patient reports that she does not experience depression but states that her eating has been somewhat of an issue.  Patient would like to continue taking her medications as prescribed.  Patient's medications to be e-prescribed to pharmacy of choice.  Collaboration of Care: Collaboration of Care: Medication Management AEB provider managing patient's psychiatric medications, Psychiatrist AEB patient being followed by a mental health provider, and Referral or follow-up with counselor/therapist AEB provider to place patient with a therapy following the conclusion of the encounter  Patient/Guardian was advised Release of Information must be obtained prior to any record release in order to collaborate their care with an outside provider. Patient/Guardian was advised if they have not already done so to contact the registration department to sign all necessary forms in order for [place] to release information regarding their care.   Consent: Patient/Guardian gives verbal consent for treatment and assignment of benefits for services provided during this visit. Patient/Guardian expressed understanding and agreed to proceed.   1. Chronic anxiety  - mirtazapine (REMERON) 7.5 MG tablet; Take 1 tablet (7.5 mg total) by mouth at bedtime.  Dispense: 30 tablet; Refill: 2 - gabapentin (NEURONTIN) 300 MG capsule; Take 2 capsules (600 mg total) by mouth 3 (three) times daily.  Dispense: 180 capsule; Refill: 2 - hydrOXYzine (ATARAX) 25 MG tablet; Take 1 tablet (25 mg total) by mouth 3 (three) times daily as needed for anxiety.  Dispense: 75 tablet; Refill: 1  2. Bipolar 1 disorder (HCC)  - mirtazapine (REMERON) 7.5 MG tablet; Take 1 tablet (7.5 mg total) by mouth at bedtime.  Dispense: 30 tablet; Refill: 2 - lithium carbonate 300 MG capsule; Take 1 capsule (300 mg  total) by mouth 2 (two) times daily with a meal.  Dispense: 60 capsule; Refill: 2 - lamoTRIgine (LAMICTAL) 100 MG tablet; Take 1 tablet (100 mg total) by mouth 2 (two) times daily.  Dispense: 60 tablet; Refill: 2 - ARIPiprazole (ABILIFY) 10 MG tablet; Take 1 tablet (10 mg total) by mouth daily.  Dispense: 30 tablet; Refill: 2  3. Chronic post-traumatic stress disorder (PTSD)  - mirtazapine (REMERON) 7.5 MG tablet; Take 1 tablet (7.5 mg total) by mouth at bedtime.  Dispense: 30 tablet; Refill: 2  Patient to follow up in 2 months Provider spent a total of 14 minutes with the patient/reviewing patient's chart  [name], PA 07/18/2022, 5:17 AM

## 2022-07-23 ENCOUNTER — Telehealth (HOSPITAL_COMMUNITY): Payer: Self-pay | Admitting: Licensed Clinical Social Worker

## 2022-07-23 NOTE — Telephone Encounter (Signed)
[name] called patient to cancel appointment on September 25.  Patient was understanding via phone call of cancellation of appointment due to this [name]'s family emergency.  Patient agreeable to add-on appointment October 30 at 10 AM virtually.  [name] message front desk staff to cancel appointment on September 25 and to add on October 30 10 AM appointment virtually

## 2022-07-25 ENCOUNTER — Other Ambulatory Visit: Payer: Self-pay

## 2022-07-30 ENCOUNTER — Ambulatory Visit (HOSPITAL_COMMUNITY): Payer: Self-pay | Admitting: Licensed Clinical Social Worker

## 2022-08-06 ENCOUNTER — Other Ambulatory Visit: Payer: Self-pay

## 2022-08-08 ENCOUNTER — Ambulatory Visit (INDEPENDENT_AMBULATORY_CARE_PROVIDER_SITE_OTHER): Payer: No Payment, Other | Admitting: Licensed Clinical Social Worker

## 2022-08-08 DIAGNOSIS — F319 Bipolar disorder, unspecified: Secondary | ICD-10-CM | POA: Diagnosis not present

## 2022-08-08 NOTE — Progress Notes (Signed)
THERAPIST PROGRESS NOTE  Virtual Visit via Video Note  I connected with Karen [name] on 08/08/22 at  4:00 PM EDT by a video enabled telemedicine application and verified that I am speaking with the correct person using two identifiers.  Location: Patient: [facility]  Provider: Providers Home    I discussed the limitations of evaluation and management by telemedicine and the availability of in person appointments. The patient expressed understanding and agreed to proceed.    I discussed the assessment and treatment plan with the patient. The patient was provided an opportunity to ask questions and all were answered. The patient agreed with the plan and demonstrated an understanding of the instructions.   The patient was advised to call back or seek an in-person evaluation if the symptoms worsen or if the condition fails to improve as anticipated.  I provided 50 minutes of non-face-to-face time during this encounter.   Dory Horn, [name]   Participation Level: Active  Behavioral Response: CasualAlertAnxious and Depressed  Type of Therapy: Individual Therapy  Treatment Goals addressed: Stabilize mood and increase goal-directed behavior: decrease PHQ-9 below 10   ProgressTowards Goals: Progressing  Interventions: CBT, Motivational Interviewing, and Supportive    Suicidal/Homicidal: Nowithout intent/plan  Therapist Response:    Pt was alert and oriented x 5. She was dressed casually and engaged well in therapy session. Pt presented with depressed, anxious, and tired mood/affect. She was pleasant, cooperative, and maintained good eye contact.   Primary stressor for pt work, relationships, childcare, and medication mgnt for mental health. Pt reports today "I am so tired physically and mentally". Pt states that she keeps forgetting to take her medications even though she has a pill box for them and keeps them in her pocket.   Intervention: [name] used solution  focused therapy to set three phone alarms throughout the day to remind pt and condition her to take her medications.   Other stressor for pt is relationship. Pt reports that she has been talking to a new girl after a breakup from her ex-boyfriend 3 months ago. Pt reports that she is not sure if she wants to pursue this potential partner. As it would be a long-distance relationship and communication has broken down such as fighting over scheduling conflicts to see each other.   Interventions: [name] used supportive therapy for praise and encouragement. [name] empowered pt and listened to pt using unconditional positive regard utilizing a non-judgment stance.   Final stressors for pt are childcare and work. Pt reports that her son has been having behavioral problems. She states that it has been a change trying to balance her mental health and childcare with her son's father. She reports that she has managed to the best of her abilities.   Interventions: [name] used psychoanalytic therapy for pt to express thoughts, feeling, and emotions in session.   Plan: Return again in 3 weeks.  Diagnosis: Bipolar 1 disorder ([place])  Collaboration of Care: Other None today   Patient/Guardian was advised Release of Information must be obtained prior to any record release in order to collaborate their care with an outside provider. Patient/Guardian was advised if they have not already done so to contact the registration department to sign all necessary forms in order for [place] to release information regarding their care.   Consent: Patient/Guardian gives verbal consent for treatment and assignment of benefits for services provided during this visit. Patient/Guardian expressed understanding and agreed to proceed.   Dory Horn, [name] 08/08/2022

## 2022-08-08 NOTE — Plan of Care (Signed)
  Problem: Bipolar Disorder CCP Problem  1 bipolar 1 disorder  Goal:  walk 3 x weekly or workout  Outcome: Progressing Goal: LTG: Stabilize mood and increase goal-directed behavior: decrease PHQ-9 below 10  Outcome: Progressing Goal: STG: Karen [name] WILL IDENTIFY COGNITIVE PATTERNS AND BELIEFS THAT INTERFERE WITH THERAPY Outcome: Progressing Goal:  decrease GAD-7 below 5  Outcome: Progressing   Problem: Bipolar Disorder CCP Problem  1 bipolar 1 disorder  Goal: maintain 40 hour a week employment  Outcome: Completed/Met

## 2022-09-03 ENCOUNTER — Ambulatory Visit (INDEPENDENT_AMBULATORY_CARE_PROVIDER_SITE_OTHER): Payer: No Payment, Other | Admitting: Licensed Clinical Social Worker

## 2022-09-03 DIAGNOSIS — F319 Bipolar disorder, unspecified: Secondary | ICD-10-CM | POA: Diagnosis not present

## 2022-09-03 NOTE — Progress Notes (Signed)
THERAPIST PROGRESS NOTE  Virtual Visit via Video Note  I connected with Karen [name] on 09/03/22 at 10:00 AM EDT by a video enabled telemedicine application and verified that I am speaking with the correct person using two identifiers.  Location: Patient: [facility]  Provider: [facility]iders Home    I discussed the limitations of evaluation and management by telemedicine and the availability of in person appointments. The patient expressed understanding and agreed to proceed.      I discussed the assessment and treatment plan with the patient. The patient was provided an opportunity to ask questions and all were answered. The patient agreed with the plan and demonstrated an understanding of the instructions.   The patient was advised to call back or seek an in-person evaluation if the symptoms worsen or if the condition fails to improve as anticipated.  I provided 45 minutes of non-face-to-face time during this encounter.   Dory Horn, [name]   Participation Level: Active  Behavioral Response: CasualAlertAnxious  Type of Therapy: Individual Therapy  Treatment Goals addressed: : [name] WILL IDENTIFY COGNITIVE PATTERNS AND BELIEFS THAT INTERFERE WITH  THERAPY   ProgressTowards Goals: Progressing  Interventions: CBT  Summary: [name] is a 31 y.o. female who presents with depressed and anxious mood\affect.  Patient was pleasant, cooperative, maintained good eye contact.  She engaged well in therapy session was dressed casually.  [name] was alert and oriented x5.  Patient comes in with stressors for relationship, depression, and anxiety.  Patient reports improvement with taking medications as prescribed which has helped stabilize her mood.  Patient reports\endorses symptoms for tension and worry.  Patient reports a decrease in overall depression symptoms for worthlessness, hopelessness, and suicidal ideations.  Patient currently denies any SI or HI.   Patient reports being able to take a vacation to [place] to see her mother.  Patient states that this was needed to help "recharge her batteries".  Patient reports that she will be traveling back today and start work back again tomorrow.  Patient reports primary stressors as work, work on self, and relationship.  Patient states that she was talking to a person in [place] but reports that that relationship is now over and patient needed to block this person's number.  Patient reports that the person was reaching out to friends and family without her consent.  [name] reports mental health has improved now that she has started to prioritize herself.  Patient reports that the hardest part about stabilizing her mental health is getting into a "routine".  Suicidal/Homicidal: Nowithout intent/plan  Therapist Response:     Intervention/Plan: [name] utilized supportive therapy for praise and encouragement.  [name] used CBT therapy for reframing and cognitive restructuring.  [name] utilized motivational interviewing for open-ended questions, reflective listening and positive affirmations.  [name] used psychoanalytic therapy for patient to express thoughts, feelings, and concerns.  [name] educated patient on taking medications as prescribed.  Plan for patient is to review each part of her day asking 3 questions 1.  What is 1 good thing that happened in her day. 2.  Was 1 bad thing that happened in her day. 3.  We will to 1 thing she can improve on for tomorrow.  Patient will utilize these questions on a daily basis and report back to [name]  Plan: Return again in 3 weeks.  Diagnosis: Bipolar 1 disorder ([place])  Collaboration of Care: Other None today  Patient/Guardian was advised Release of Information must be obtained prior to  any record release in order to collaborate their care with an outside provider. Patient/Guardian was advised if they have not already done so to contact the registration department to sign all  necessary forms in order for [place] to release information regarding their care.   Consent: Patient/Guardian gives verbal consent for treatment and assignment of benefits for services provided during this visit. Patient/Guardian expressed understanding and agreed to proceed.   Dory Horn, [name] 09/03/2022

## 2022-09-19 ENCOUNTER — Ambulatory Visit (INDEPENDENT_AMBULATORY_CARE_PROVIDER_SITE_OTHER): Payer: No Payment, Other | Admitting: Licensed Clinical Social Worker

## 2022-09-19 DIAGNOSIS — F319 Bipolar disorder, unspecified: Secondary | ICD-10-CM | POA: Diagnosis not present

## 2022-09-19 NOTE — Progress Notes (Signed)
   THERAPIST PROGRESS NOTE  Virtual Visit via Video Note  I connected with Karen [name] on 09/19/22 at  4:00 PM EST by a video enabled telemedicine application and verified that I am speaking with the correct person using two identifiers.  Location: Patient: [facility]  Provider: [facility]    I discussed the limitations of evaluation and management by telemedicine and the availability of in person appointments. The patient expressed understanding and agreed to proceed.      I discussed the assessment and treatment plan with the patient. The patient was provided an opportunity to ask questions and all were answered. The patient agreed with the plan and demonstrated an understanding of the instructions.   The patient was advised to call back or seek an in-person evaluation if the symptoms worsen or if the condition fails to improve as anticipated.  I provided 30 minutes of non-face-to-face time during this encounter.   Dory Horn, [name]   Participation Level: Active  Behavioral Response: CasualAlertAnxious and Depressed  Type of Therapy: Individual Therapy  Treatment Goals addressed: Karen [name] WILL IDENTIFY COGNITIVE PATTERNS AND BELIEFS THAT INTERFERE WITH THERAPY   ProgressTowards Goals: Progressing  Interventions: Motivational Interviewing and Supportive   Suicidal/Homicidal: Nowithout intent/plan  Therapist Response:     Pt was alert and oriented x 5. She was dressed casually and engaged well in therapy session. Pt presented with depressed and anxious mood/affect. She was pleasant, cooperative, and maintained good eye contact.    Primary stressors for pt are relationship, family conflict, and work. Pt reports that she has been sick for the past several days. She has received points against her at work for leaving work early because she was sick. Pt reports that she also works with younger people that can be dramatic within the work environment because  they do not like one another. Other stressor is family conflict for her father's illness. Pt reports that he has brain cancer and is only given months to live. Pt reports that she is attempting to get things figured out with her work for bereavement. Pt reports that she only gets 3-day bereavement when her father does pass which pt reports will not be enough. Karen [name] states she is also navigating a new relationship, which has gone overall well in over the past two months.   Interventions/Plan: [name] used supportive therapy for praise and encouragement. [name] administered a PHQ-9. [name] reviewed scores with pt. [name] notes a decrease in PHQ-9. Pt reports no suicidal ideations since being hospitalized 2 months ago. [name] used psychoanalytic therapy for pt to express thoughts, feeling and emotions in session.   Plan: Return again in 3 weeks.  Diagnosis: Bipolar depression ([place])  Collaboration of Care: Other None    Patient/Guardian was advised Release of Information must be obtained prior to any record release in order to collaborate their care with an outside provider. Patient/Guardian was advised if they have not already done so to contact the registration department to sign all necessary forms in order for [place] to release information regarding their care.   Consent: Patient/Guardian gives verbal consent for treatment and assignment of benefits for services provided during this visit. Patient/Guardian expressed understanding and agreed to proceed.   Dory Horn, [name] 09/19/2022

## 2022-09-25 ENCOUNTER — Other Ambulatory Visit: Payer: Self-pay

## 2022-10-03 ENCOUNTER — Ambulatory Visit (INDEPENDENT_AMBULATORY_CARE_PROVIDER_SITE_OTHER): Payer: No Payment, Other | Admitting: Licensed Clinical Social Worker

## 2022-10-03 DIAGNOSIS — F319 Bipolar disorder, unspecified: Secondary | ICD-10-CM | POA: Diagnosis not present

## 2022-10-03 NOTE — Progress Notes (Signed)
   THERAPIST PROGRESS NOTE  Virtual Visit via Video Note  I connected with Karen [name] on 10/04/22 at  4:00 PM EST by a video enabled telemedicine application and verified that I am speaking with the correct person using two identifiers.  Location: Patient: [facility]  Provider: [facility]    I discussed the limitations of evaluation and management by telemedicine and the availability of in person appointments. The patient expressed understanding and agreed to proceed.   I discussed the assessment and treatment plan with the patient. The patient was provided an opportunity to ask questions and all were answered. The patient agreed with the plan and demonstrated an understanding of the instructions.   The patient was advised to call back or seek an in-person evaluation if the symptoms worsen or if the condition fails to improve as anticipated.  I provided 30 minutes of non-face-to-face time during this encounter.   Dory Horn, [name]   Participation Level: Active  Behavioral Response: CasualAlertAnxious and Depressed  Type of Therapy: Individual Therapy  Treatment Goals addressed: Karen [name] WILL IDENTIFY COGNITIVE PATTERNS AND BELIEFS THAT INTERFERE WITH THERAPY    ProgressTowards Goals: Progressing  Interventions: CBT, Motivational Interviewing, and Supportive   Suicidal/Homicidal: Nowithout intent/plan  Therapist Response:    Pt was alert and oriented x 5. She was dressed casually and engaged well in therapy session. Pt presented with depressed and anxious mood/affect. She was pleasant, cooperative and maintained good eye contact.   Primary stressor for pt are relationship and stress of motherhood. Pt reports that things are going well in her three-month relationship. Karen [name] states her next step is to introduce her significant other child to her child. Pt reports tension and worry about the introduction. Pt states that she also has worry about her mental  health. Even though she has been honest about her bipolar Dx she knows that she is currently stable. Pt reports "What happens when I go into depression, or I get manic? They all run then".   Intervention/Plan: [name] spoke with pt about open and honest communication. [name] used supportive therapy for praise and encouragement. [name] used psychoanalytic therapy for pt to express thoughts, feeling and emotions.  [name] used empowerment for person centered therapy. [name] used education on bipolar Dx going over symptoms.   Plan: Return again in 3 weeks.  Diagnosis: Bipolar 1 disorder ([place])  Collaboration of Care: Other None today   Patient/Guardian was advised Release of Information must be obtained prior to any record release in order to collaborate their care with an outside provider. Patient/Guardian was advised if they have not already done so to contact the registration department to sign all necessary forms in order for [place] to release information regarding their care.   Consent: Patient/Guardian gives verbal consent for treatment and assignment of benefits for services provided during this visit. Patient/Guardian expressed understanding and agreed to proceed.   Dory Horn, [name] 10/04/2022

## 2022-10-18 ENCOUNTER — Ambulatory Visit (INDEPENDENT_AMBULATORY_CARE_PROVIDER_SITE_OTHER): Payer: No Payment, Other | Admitting: Licensed Clinical Social Worker

## 2022-10-18 DIAGNOSIS — F332 Major depressive disorder, recurrent severe without psychotic features: Secondary | ICD-10-CM | POA: Diagnosis not present

## 2022-10-18 DIAGNOSIS — F319 Bipolar disorder, unspecified: Secondary | ICD-10-CM

## 2022-10-18 NOTE — Progress Notes (Signed)
   THERAPIST PROGRESS NOTE  Virtual Visit via Video Note  I connected with Karen [name] on 10/19/22 at  4:00 PM EST by a video enabled telemedicine application and verified that I am speaking with the correct person using two identifiers.  Location: Patient: [facility] Provider: Provider Home    I discussed the limitations of evaluation and management by telemedicine and the availability of in person appointments. The patient expressed understanding and agreed to proceed.     I discussed the assessment and treatment plan with the patient. The patient was provided an opportunity to ask questions and all were answered. The patient agreed with the plan and demonstrated an understanding of the instructions.   The patient was advised to call back or seek an in-person evaluation if the symptoms worsen or if the condition fails to improve as anticipated.  I provided 45 minutes of non-face-to-face time during this encounter.  Dory Horn, [name]   Participation Level: Active  Behavioral Response: CasualAlertAnxious  Type of Therapy: Individual Therapy  Treatment Goals addressed: Karen [name] WILL IDENTIFY COGNITIVE PATTERNS AND BELIEFS THAT INTERFERE WITH THERAPY   ProgressTowards Goals: Progressing  Interventions: Motivational Interviewing and Supportive   Suicidal/Homicidal: Nowithout intent/plan  Therapist Response:   Pt was alert and oriented x  5. She was dressed casually and engaged well in therapy session. She was pleasant, cooperative and maintained good eye contact. She presented with stressed and anxious mood/affect.   Pt reports primary stressor as grief/loss and financials. Pt reports she went to go see her father with terminal brain cancer. Karen [name] states he is in the end stages. Pt reports "It did not go well. Karen [name] was a lot and not on his best behavior and he kept on bringing up financial. Thinking that I should be more stable in that department at my age." Pt  states that she works a full-time job and still struggles with her financials. Karen [name] reports that hearing this from her father is hard as there is a history between her and her father, but pt does not want to bring things up as she knows he only has a small time left before the cancer will end his life.   Intervention/Plan:   [name] used educated on the stages of grief/loss for: Sadness, anger, bargaining, acceptance, and denial. [name] used unconditional positive regard maintaining nonjudgment mental stance in session. [name] used reflective listening and open-ended questions when pt talked about processing through her grief.    Plan: Return again in 3 weeks.  Diagnosis: Bipolar 1 disorder ([facility])  Severe episode of recurrent major depressive disorder, without psychotic features ([place])  Collaboration of Care: Other None today   Patient/Guardian was advised Release of Information must be obtained prior to any record release in order to collaborate their care with an outside provider. Patient/Guardian was advised if they have not already done so to contact the registration department to sign all necessary forms in order for [place] to release information regarding their care.   Consent: Patient/Guardian gives verbal consent for treatment and assignment of benefits for services provided during this visit. Patient/Guardian expressed understanding and agreed to proceed.   Dory Horn, [name] 10/19/2022

## 2022-11-07 ENCOUNTER — Ambulatory Visit (INDEPENDENT_AMBULATORY_CARE_PROVIDER_SITE_OTHER): Payer: No Payment, Other | Admitting: Licensed Clinical Social Worker

## 2022-11-07 ENCOUNTER — Other Ambulatory Visit (HOSPITAL_COMMUNITY): Payer: Self-pay | Admitting: Physician Assistant

## 2022-11-07 DIAGNOSIS — F319 Bipolar disorder, unspecified: Secondary | ICD-10-CM

## 2022-11-07 DIAGNOSIS — F419 Anxiety disorder, unspecified: Secondary | ICD-10-CM

## 2022-11-07 DIAGNOSIS — F4312 Post-traumatic stress disorder, chronic: Secondary | ICD-10-CM

## 2022-11-07 MED ORDER — LAMOTRIGINE 100 MG PO TABS
100.0000 mg | ORAL_TABLET | Freq: Two times a day (BID) | ORAL | 1 refills | Status: DC
  Filled 2022-11-07: qty 60, 30d supply, fill #0

## 2022-11-07 MED ORDER — HYDROXYZINE HCL 25 MG PO TABS
25.0000 mg | ORAL_TABLET | Freq: Three times a day (TID) | ORAL | 1 refills | Status: DC | PRN
  Filled 2022-11-07: qty 75, 25d supply, fill #0

## 2022-11-07 MED ORDER — ARIPIPRAZOLE 10 MG PO TABS
10.0000 mg | ORAL_TABLET | Freq: Every day | ORAL | 1 refills | Status: DC
  Filled 2022-11-07: qty 30, 30d supply, fill #0

## 2022-11-07 MED ORDER — MIRTAZAPINE 7.5 MG PO TABS
7.5000 mg | ORAL_TABLET | Freq: Every day | ORAL | 1 refills | Status: DC
  Filled 2022-11-07: qty 30, 30d supply, fill #0

## 2022-11-07 NOTE — Progress Notes (Signed)
Provider was contacted by the patient's licensed clinical social worker regarding the need for her medications to be refilled.  Provider was informed that patient is no longer taking lithium due to patient's loss of appetite.  Patient is open to the idea of taking lithium once her appetite returns.  Provider was informed that patient has been taking her medications every other day as a way to ration her medications so she does not run out.  Patient's medications (Abilify, mirtazapine, hydroxyzine, and lamotrigine) to be e-prescribed to pharmacy of choice.

## 2022-11-07 NOTE — Progress Notes (Unsigned)
   THERAPIST PROGRESS NOTE  Virtual Visit via Video Note  I connected with Karen [name] on 11/07/22 at  4:00 PM EST by a video enabled telemedicine application and verified that I am speaking with the correct person using two identifiers.  Location: Patient: [facility]  Provider: Providers Home    I discussed the limitations of evaluation and management by telemedicine and the availability of in person appointments. The patient expressed understanding and agreed to proceed.      I discussed the assessment and treatment plan with the patient. The patient was provided an opportunity to ask questions and all were answered. The patient agreed with the plan and demonstrated an understanding of the instructions.   The patient was advised to call back or seek an in-person evaluation if the symptoms worsen or if the condition fails to improve as anticipated.  I provided 55 minutes of non-face-to-face time during this encounter.   Dory Horn, [name]   Participation Level: Active  Behavioral Response: CasualAlertAnxious and Depressed  Type of Therapy: Individual Therapy  Treatment Goals addressed:  Karen [name] WILL IDENTIFY COGNITIVE PATTERNS AND BELIEFS THAT INTERFERE WITH THERAPY    Worked on education of taking medication 7/7 days per week and understanding taking medication every other day runs the risk of them being 50% less effective.    ProgressTowards Goals: Progressing  Interventions: CBT, Motivational Interviewing, and Supportive   Suicidal/Homicidal: Nowithout intent/plan  Therapist Response:   Pt was alert and oriented x 5. She was dressed casually and engaged well in therapy session. Karen [name] presented with depressed, flat and anxious mood/affect. She was pleasant, cooperative and maintained good eye contact.   Primary stressor is medication management, medication appointment f/u, and increased depression. Pt denies suicidal or homicidal ideations  currently. Karen [name] reports she has not been taking her medication as prescribed. She reports that she is running out of medications and does not have medication f/u appointment. Pt reports that she is taking all her medication except Hydroxyzine (PRN) and lithium every other day. Hydroxyzine pt only takes as needed and lithium pt has not been taking because of poor appetite.   Intervention: Solution focused therapy was used today. [name] reached out to medication provider to refill current prescription he was agreeable to fill everything minus the lithium until labs could be drawn and appetite return as pt should take it with meals. [name] message admin staff for medication mgmt. appointment. They scheduled her for Jan 9th 2024. [name] updated pt on information.      Plan: Return again in 3 weeks.  Diagnosis: Bipolar 1 disorder ([place])  Collaboration of Care: Other None today   Patient/Guardian was advised Release of Information must be obtained prior to any record release in order to collaborate their care with an outside provider. Patient/Guardian was advised if they have not already done so to contact the registration department to sign all necessary forms in order for [place] to release information regarding their care.   Consent: Patient/Guardian gives verbal consent for treatment and assignment of benefits for services provided during this visit. Patient/Guardian expressed understanding and agreed to proceed.   Dory Horn, [name] 11/07/2022

## 2022-11-08 ENCOUNTER — Other Ambulatory Visit: Payer: Self-pay

## 2022-11-13 ENCOUNTER — Telehealth (INDEPENDENT_AMBULATORY_CARE_PROVIDER_SITE_OTHER): Payer: No Payment, Other | Admitting: Physician Assistant

## 2022-11-13 ENCOUNTER — Other Ambulatory Visit: Payer: Self-pay

## 2022-11-13 ENCOUNTER — Encounter (HOSPITAL_COMMUNITY): Payer: Self-pay | Admitting: Physician Assistant

## 2022-11-13 DIAGNOSIS — F319 Bipolar disorder, unspecified: Secondary | ICD-10-CM | POA: Diagnosis not present

## 2022-11-13 DIAGNOSIS — F419 Anxiety disorder, unspecified: Secondary | ICD-10-CM | POA: Diagnosis not present

## 2022-11-13 DIAGNOSIS — F4312 Post-traumatic stress disorder, chronic: Secondary | ICD-10-CM | POA: Diagnosis not present

## 2022-11-13 MED ORDER — MIRTAZAPINE 7.5 MG PO TABS
7.5000 mg | ORAL_TABLET | Freq: Every day | ORAL | 1 refills | Status: DC
  Filled 2022-11-13: qty 30, 30d supply, fill #0

## 2022-11-13 MED ORDER — ARIPIPRAZOLE 15 MG PO TABS
15.0000 mg | ORAL_TABLET | Freq: Every day | ORAL | 1 refills | Status: DC
  Filled 2022-11-13: qty 30, 30d supply, fill #0

## 2022-11-13 MED ORDER — HYDROXYZINE HCL 25 MG PO TABS
25.0000 mg | ORAL_TABLET | Freq: Three times a day (TID) | ORAL | 1 refills | Status: DC | PRN
  Filled 2022-11-13: qty 75, 25d supply, fill #0

## 2022-11-13 MED ORDER — LAMOTRIGINE 100 MG PO TABS
100.0000 mg | ORAL_TABLET | Freq: Two times a day (BID) | ORAL | 1 refills | Status: DC
  Filled 2022-11-13: qty 60, 30d supply, fill #0

## 2022-11-13 MED ORDER — GABAPENTIN 300 MG PO CAPS
600.0000 mg | ORAL_CAPSULE | Freq: Three times a day (TID) | ORAL | 1 refills | Status: DC
  Filled 2022-11-13: qty 180, 30d supply, fill #0
  Filled 2023-01-22 (×2): qty 180, 30d supply, fill #1

## 2022-11-13 NOTE — Progress Notes (Signed)
[name] MD/PA/NP OP Progress Note  Virtual Visit via Telephone Note  I connected with Karen [name] on 11/13/22 at  3:00 PM EST by telephone and verified that I am speaking with the correct person using two identifiers.  Location: Patient: Home Provider: Clinic   I discussed the limitations, risks, security and privacy concerns of performing an evaluation and management service by telephone and the availability of in person appointments. I also discussed with the patient that there may be a patient responsible charge related to this service. The patient expressed understanding and agreed to proceed.  Follow Up Instructions:  I discussed the assessment and treatment plan with the patient. The patient was provided an opportunity to ask questions and all were answered. The patient agreed with the plan and demonstrated an understanding of the instructions.   The patient was advised to call back or seek an in-person evaluation if the symptoms worsen or if the condition fails to improve as anticipated.  I provided 16 minutes of non-face-to-face time during this encounter.  [name], PA    11/13/2022 5:12 PM [name]  MRN:  [id number]  Chief Complaint:  Chief Complaint  Patient presents with   Follow-up   Medication Management   HPI:   Karen [name] is a 32 year old, Caucasian female with a past psychiatric history significant for chronic PTSD, bipolar disorder, and chronic anxiety who presents to [facility] via virtual telephone visit for follow-up and medication management.  Patient was last seen by this provider on 07/17/2022.  During her last encounter, patient was being managed on the following psychiatric medications:  Mirtazapine 7.5 mg at bedtime Gabapentin 600 mg 3 times daily Hydroxyzine 25 mg 3 times daily as needed Lithium carbonate 300 mg 2 times daily with a female Lamotrigine 100 mg 2 times daily Abilify 10 mg  daily  Patient reports that she has not been taking lithium due to having difficulties with aligning her eating schedule with the time she needs to take her medication.  Patient reports that she eats at odd hours during the day so it is hard for her to take her lithium while eating.  Since the last encounter, patient states that she has been consistently depressed but not to the point of wanting to harm herself.  Patient rates her depression as 6 out of 10 with 10 being most severe.  Patient's depressive symptoms are characterized by the following: lack of motivation, anhedonia, low mood, and difficulty getting out of bed.  Patient also endorses anxiety and rates it at 10 out of 10.  Patient states that although her gabapentin has been helpful with managing her anxiety, she has started experiencing physical symptoms of anxiety.  Patient states that she will often find herself shaking really bad when anxious.  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  Patient denies any new stressors and states that her wife has got an better but does not know why she feels depressed or anxious.  PHQ-9 screen was performed with the patient scoring a 19.  A GAD-7 screen was also performed with the patient scoring a 21.  Patient is alert and oriented x 4, calm, cooperative, and fully engaged in conversation during the encounter.  Patient endorses feeling restless.  Patient denies suicidal or homicidal ideations.  She further denies auditory or visual hallucinations and does not appear to be responding to internal/external stimuli.  Patient endorses  good sleep and receives on average 8 hours of sleep each night.  Patient endorses fair appetite and eats on average 1 or 2 meals per day.  Patient denies alcohol consumption, tobacco use, and illicit drug use.  Visit Diagnosis:    ICD-10-CM   1. Chronic anxiety  F41.9 mirtazapine (REMERON) 7.5 MG tablet    gabapentin (NEURONTIN)  300 MG capsule    hydrOXYzine (ATARAX) 25 MG tablet    2. Bipolar 1 disorder (HCC)  F31.9 mirtazapine (REMERON) 7.5 MG tablet    lamoTRIgine (LAMICTAL) 100 MG tablet    ARIPiprazole (ABILIFY) 15 MG tablet    3. Chronic post-traumatic stress disorder (PTSD)  F43.12 mirtazapine (REMERON) 7.5 MG tablet      Past Psychiatric History:  Bipolar disorder Generalized anxiety disorder Visual hallucinations Chronic PTSD  Past Medical History:  Past Medical History:  Diagnosis Date   Anxiety    Bipolar disorder ([place])    Chronic UTI    Depression    Endometriosis    HSV (herpes simplex virus) anogenital infection    PTSD (post-traumatic stress disorder)     Past Surgical History:  Procedure Laterality Date   CESAREAN SECTION N/A 02/17/2018   Procedure: CESAREAN SECTION;  Surgeon: Sanjuana Kava, MD;  Location: [facility];  Service: Obstetrics;  Laterality: N/A;   COLONOSCOPY     DIAGNOSTIC LAPAROSCOPY  11/05/2012   DILATION AND EVACUATION N/A 03/11/2015   Procedure: DILATATION AND EVACUATION;  Surgeon: Waymon Amato, MD;  Location: [facility];  Service: Gynecology;  Laterality: N/A;   DRUG INDUCED ENDOSCOPY     LAPAROSCOPY     LAPAROSCOPY N/A 06/07/2021   Procedure: LAPAROSCOPY OPERATIVE with /EXCISION OF LESIONS and lysis of adhesions;  Surgeon: Waymon Amato, MD;  Location: [facility];  Service: Gynecology;  Laterality: N/A;   TONSILLECTOMY  11/05/2013    Family Psychiatric History:  Mother - Major Depressive Disorder Sister - Obsessive Compulsive Disorder Aunt (maternal) - Bipolar I Disorder Uncle (maternal) - Bipolar I Disorder  Family History:  Family History  Problem Relation Age of Onset   Rheum arthritis Mother    Cancer Father     Social History:  Social History   Socioeconomic History   Marital status: Married    Spouse name: [name]   Number of children: 1   Years of education: Not on file   Highest education level: Some college, no degree  Occupational History    Not on file  Tobacco Use   Smoking status: Every Day    Types: E-cigarettes   Smokeless tobacco: Never   Tobacco comments:    PT VAPES DAILY, '' I vape a lot ''   Vaping Use   Vaping Use: Every day   Substances: Nicotine, Flavoring  Substance and Sexual Activity   Alcohol use: Not Currently   Drug use: Not Currently    Types: Heroin, Cocaine, Marijuana    Comment: Clean since Aug 2015   Sexual activity: Yes    Partners: Male  Other Topics Concern   Not on file  Social History Narrative   Not on file   Social Determinants of Health   Financial Resource Strain: Medium Risk (03/07/2022)   Overall Financial Resource Strain (CARDIA)    Difficulty of Paying Living Expenses: Somewhat hard  Food Insecurity: No Food Insecurity (03/07/2022)   Hunger Vital Sign    Worried About Running Out of Food in the Last Year: Never true    [place] in the  Last Year: Never true  Transportation Needs: No Transportation Needs (03/07/2022)   PRAPARE - Hydrologist (Medical): No    Lack of Transportation (Non-Medical): No  Physical Activity: Sufficiently Active (03/07/2022)   Exercise Vital Sign    Days of Exercise per Week: 3 days    Minutes of Exercise per Session: 60 min  Stress: Stress Concern Present (03/07/2022)   [place]    Feeling of Stress : Rather much  Social Connections: Moderately Isolated (03/07/2022)   Social Connection and Isolation Panel [NHANES]    Frequency of Communication with Friends and Family: More than three times a week    Frequency of Social Gatherings with Friends and Family: More than three times a week    Attends Religious Services: Never    Marine scientist or Organizations: Yes    Attends Music therapist: More than 4 times per year    Marital Status: Separated    Allergies:  Allergies  Allergen Reactions   Ciprofloxacin Hives   Wound Dressing  Adhesive Rash    Metabolic Disorder Labs: Lab Results  Component Value Date   HGBA1C 4.6 (L) 07/02/2022   MPG 85.32 07/02/2022   MPG 85.32 09/11/2020   No results found for: "PROLACTIN" Lab Results  Component Value Date   CHOL 117 07/02/2022   TRIG 94 07/02/2022   HDL 29 (L) 07/02/2022   CHOLHDL 4.0 07/02/2022   VLDL 19 07/02/2022   LDLCALC 69 07/02/2022   LDLCALC 78 05/09/2021   Lab Results  Component Value Date   TSH 0.519 07/02/2022   TSH 0.784 09/11/2020    Therapeutic Level Labs: Lab Results  Component Value Date   LITHIUM 0.27 (L) 07/06/2022   No results found for: "VALPROATE" No results found for: "CBMZ"  Current Medications: Current Outpatient Medications  Medication Sig Dispense Refill   acetaminophen (TYLENOL) 325 MG tablet Take 2 tablets (650 mg total) by mouth every 6 (six) hours as needed for mild pain or moderate pain. 30 tablet 1   ARIPiprazole (ABILIFY) 15 MG tablet Take 1 tablet (15 mg total) by mouth daily. 30 tablet 1   gabapentin (NEURONTIN) 300 MG capsule Take 2 capsules (600 mg total) by mouth 3 (three) times daily. 180 capsule 1   hydrOXYzine (ATARAX) 25 MG tablet Take 1 tablet (25 mg total) by mouth 3 (three) times daily as needed for anxiety. 75 tablet 1   lamoTRIgine (LAMICTAL) 100 MG tablet Take 1 tablet (100 mg total) by mouth 2 (two) times daily. 60 tablet 1   lithium carbonate 300 MG capsule Take 1 capsule (300 mg total) by mouth 2 (two) times daily with a meal. 60 capsule 2   medroxyPROGESTERone (DEPO-PROVERA) 150 MG/ML injection Inject 150 mg into the muscle every 3 (three) months. Next dose due 07/03/22     mirtazapine (REMERON) 7.5 MG tablet Take 1 tablet (7.5 mg total) by mouth at bedtime. 30 tablet 1   Multiple Vitamins-Minerals (MULTIVITAMIN WITH MINERALS) tablet Take 1 tablet by mouth daily.     No current facility-administered medications for this visit.     Musculoskeletal: Strength & Muscle Tone: Unable to assess due to  telemedicine visit [place]: Unable to assess due to telemedicine visit Patient leans: Unable to assess due to telemedicine visit  Psychiatric Specialty Exam: Review of Systems  Psychiatric/Behavioral:  Negative for decreased concentration, dysphoric mood, hallucinations, self-injury, sleep disturbance and suicidal ideas.  The patient is nervous/anxious. The patient is not hyperactive.     There were no vitals taken for this visit.There is no height or weight on file to calculate BMI.  General Appearance: Unable to assess due to telemedicine visit  Eye Contact:  Unable to assess due to telemedicine visit  Speech:  Clear and Coherent and Normal Rate  Volume:  Normal  Mood:  Anxious and Depressed  Affect:  Congruent  Thought Process:  Coherent, Goal Directed, and Descriptions of Associations: Intact  Orientation:  Full (Time, Place, and Person)  Thought Content: WDL   Suicidal Thoughts:  No  Homicidal Thoughts:  No  Memory:  Immediate;   Good Recent;   Good Remote;   Good  Judgement:  Good  Insight:  Good  Psychomotor Activity:  Normal  Concentration:  Concentration: Good and Attention Span: Good  Recall:  Good  Fund of Knowledge: Good  Language: Good  Akathisia:  No  Handed:  Ambidextrous  AIMS (if indicated): not done  Assets:  Communication Skills Desire for Improvement Housing Social Support Vocational/Educational  ADL's:  Intact  Cognition: WNL  Sleep:  Good   Screenings: AIMS    Flowsheet Row Admission (Discharged) from OP Visit from 07/01/2022 in [facility] [facility] Admission (Discharged) from OP Visit from 09/10/2020 in [place] [facility] Admission (Discharged) from OP Visit from 11/12/2019 in [place] [facility]  AIMS Total Score 0 0 0      AUDIT    Flowsheet Row Admission (Discharged) from OP Visit from 07/01/2022 in [place] [facility] Admission  (Discharged) from OP Visit from 09/10/2020 in [facility] [facility] Admission (Discharged) from OP Visit from 11/12/2019 in [place] [facility]  Alcohol Use Disorder Identification Test Final Score (AUDIT) 0 0 0      GAD-7    Flowsheet Row Video Visit from 11/13/2022 in [facility] Video Visit from 07/17/2022 in [facility] Counselor from 03/07/2022 in [facility] Video Visit from 03/06/2022 in [facility] Video Visit from 01/02/2022 in [facility]  Total GAD-7 Score '21 19 9 8 10      '$ PHQ2-9    Flowsheet Row Video Visit from 11/13/2022 in [facility] Counselor from 09/19/2022 in [facility] Video Visit from 07/17/2022 in [facility] Counselor from 03/07/2022 in [facility] Video Visit from 03/06/2022 in [place]  PHQ-2 Total Score '6 2 4 3 2  '$ PHQ-9 Total Score '19 6 16 14 11      '$ Flowsheet Row Video Visit from 11/13/2022 in [facility] Video Visit from 07/17/2022 in [facility] Admission (Discharged) from OP Visit from 07/01/2022 in [place] [facility]  C-SSRS RISK CATEGORY Moderate Risk High Risk High Risk        Assessment and Plan:   Karen [name] is a 32 year old, Caucasian female with a past psychiatric history significant for chronic PTSD, bipolar disorder, and chronic anxiety who presents to [facility] via virtual telephone visit for follow-up and medication management.  Patient reports that she is no longer taking lithium due to being unable to set her eating schedule around the time she needs to take the medication.  Although patient  endorses improvements in her life, patient states that she is consistently depressed and experiences physical symptoms of anxiety.  Patient was recommended increasing her dosage of Abilify from 10 mg to 15 mg daily for the management of her depressive symptoms.  Provider discussed adding on Inderal for the management of patient's physical manifestations of her anxiety; however, provider will hold off on placing patient on medication due to patient having history of low blood pressure.  Patient's medications to be e-prescribed to pharmacy of choice.  Collaboration of Care: Collaboration of Care: Medication Management AEB provider managing patient's psychiatric medications, Psychiatrist AEB patient being followed by mental health provider, and Referral or follow-up with counselor/therapist AEB being seen by a licensed clinical social worker at this facility  Patient/Guardian was advised Release of Information must be obtained prior to any record release in order to collaborate their care with an outside provider. Patient/Guardian was advised if they have not already done so to contact the registration department to sign all necessary forms in order for [place] to release information regarding their care.   Consent: Patient/Guardian gives verbal consent for treatment and assignment of benefits for services provided during this visit. Patient/Guardian expressed understanding and agreed to proceed.   1. Chronic anxiety  - mirtazapine (REMERON) 7.5 MG tablet; Take 1 tablet (7.5 mg total) by mouth at bedtime.  Dispense: 30 tablet; Refill: 1 - gabapentin (NEURONTIN) 300 MG capsule; Take 2 capsules (600 mg total) by mouth 3 (three) times daily.  Dispense: 180 capsule; Refill: 1 - hydrOXYzine (ATARAX) 25 MG tablet; Take 1 tablet (25 mg total) by mouth 3 (three) times daily as needed for anxiety.  Dispense: 75 tablet; Refill: 1  2. Bipolar 1 disorder (HCC)  - mirtazapine (REMERON) 7.5 MG tablet; Take 1 tablet (7.5  mg total) by mouth at bedtime.  Dispense: 30 tablet; Refill: 1 - lamoTRIgine (LAMICTAL) 100 MG tablet; Take 1 tablet (100 mg total) by mouth 2 (two) times daily.  Dispense: 60 tablet; Refill: 1 - ARIPiprazole (ABILIFY) 15 MG tablet; Take 1 tablet (15 mg total) by mouth daily.  Dispense: 30 tablet; Refill: 1  3. Chronic post-traumatic stress disorder (PTSD)  - mirtazapine (REMERON) 7.5 MG tablet; Take 1 tablet (7.5 mg total) by mouth at bedtime.  Dispense: 30 tablet; Refill: 1  Patient to follow-up in 6 weeks Provider spent a total of 16 minutes with the patient/reviewing patient's chart  [name], PA 11/13/2022, 5:12 PM

## 2022-11-14 ENCOUNTER — Other Ambulatory Visit: Payer: Self-pay

## 2022-11-29 ENCOUNTER — Ambulatory Visit (HOSPITAL_COMMUNITY): Payer: No Payment, Other | Admitting: Licensed Clinical Social Worker

## 2022-12-05 ENCOUNTER — Inpatient Hospital Stay (HOSPITAL_COMMUNITY): Admission: RE | Admit: 2022-12-05 | Payer: Self-pay | Source: Ambulatory Visit

## 2022-12-21 ENCOUNTER — Other Ambulatory Visit: Payer: Self-pay

## 2022-12-26 ENCOUNTER — Telehealth (INDEPENDENT_AMBULATORY_CARE_PROVIDER_SITE_OTHER): Payer: Self-pay | Admitting: Physician Assistant

## 2022-12-26 ENCOUNTER — Encounter (HOSPITAL_COMMUNITY): Payer: Self-pay | Admitting: Physician Assistant

## 2022-12-26 DIAGNOSIS — F419 Anxiety disorder, unspecified: Secondary | ICD-10-CM

## 2022-12-26 DIAGNOSIS — F319 Bipolar disorder, unspecified: Secondary | ICD-10-CM

## 2022-12-26 DIAGNOSIS — F4312 Post-traumatic stress disorder, chronic: Secondary | ICD-10-CM

## 2022-12-26 MED ORDER — ARIPIPRAZOLE 20 MG PO TABS
20.0000 mg | ORAL_TABLET | Freq: Every day | ORAL | 1 refills | Status: DC
  Filled 2022-12-26 – 2022-12-27 (×2): qty 30, 30d supply, fill #0
  Filled 2023-01-22 (×2): qty 30, 30d supply, fill #1

## 2022-12-26 MED ORDER — LAMOTRIGINE 100 MG PO TABS
100.0000 mg | ORAL_TABLET | Freq: Two times a day (BID) | ORAL | 1 refills | Status: DC
  Filled 2022-12-26 – 2022-12-27 (×2): qty 60, 30d supply, fill #0
  Filled 2023-01-22 (×2): qty 60, 30d supply, fill #1

## 2022-12-26 MED ORDER — MIRTAZAPINE 7.5 MG PO TABS
7.5000 mg | ORAL_TABLET | Freq: Every day | ORAL | 1 refills | Status: DC
  Filled 2022-12-26 – 2022-12-27 (×2): qty 30, 30d supply, fill #0
  Filled 2023-01-22 (×2): qty 30, 30d supply, fill #1

## 2022-12-26 MED ORDER — HYDROXYZINE HCL 25 MG PO TABS
25.0000 mg | ORAL_TABLET | Freq: Three times a day (TID) | ORAL | 1 refills | Status: DC | PRN
  Filled 2022-12-26 – 2022-12-27 (×3): qty 75, 25d supply, fill #0

## 2022-12-26 NOTE — Progress Notes (Signed)
BH MD/PA/NP OP Progress Note  Virtual Visit via Video Note  I connected with Karen [name] on 12/26/22 at  3:00 PM EST by a video enabled telemedicine application and verified that I am speaking with the correct person using two identifiers.  Location: Patient: Home Provider: Clinic   I discussed the limitations of evaluation and management by telemedicine and the availability of in person appointments. The patient expressed understanding and agreed to proceed.  Follow Up Instructions:  I discussed the assessment and treatment plan with the patient. The patient was provided an opportunity to ask questions and all were answered. The patient agreed with the plan and demonstrated an understanding of the instructions.   The patient was advised to call back or seek an in-person evaluation if the symptoms worsen or if the condition fails to improve as anticipated.  I provided 11 minutes of non-face-to-face time during this encounter.  [name], PA    12/26/2022 6:59 PM Karen [name]  MRN:  [id number]  Chief Complaint:  Chief Complaint  Patient presents with   Follow-up   Medication Management   HPI:   Karen [name] is a 32 year old, Caucasian female with a past psychiatric history significant for chronic anxiety, bipolar 1 disorder, and chronic PTSD who presents to [facility] via virtual video visit for follow-up and medication management.  Patient is currently being managed on the following psychiatric medications:  Mirtazapine 7.5 mg at bedtime Gabapentin 600 mg 3 times daily Hydroxyzine 25 mg 3 times daily as needed Lamotrigine 100 mg 2 times daily Abilify 15 mg daily  Patient reports that she has been experiencing depression for a week.  Patient denies there being an underlying cause to her depression and feels awful all the time.  Patient reports that she has been taking her medications regularly and has been  depressed since the last visit.  Since last visit, patient states that her depression has worsened.  Patient rates her depression an 8 out of 10 with 10 being most severe.  Patient endorses the following depressive symptoms: decreased energy, irritability, crying spells, lack of motivation, and decreased interest in activities or hobbies.  Patient states that her anxiety level has been terrible and states that her depression feeds her anxiety.  Patient rates her anxiety an 8 or 9 out of 10.  Patient denies any new stressors at this time and has even alleviated some of her current stressors.  A PHQ-9 screen was performed with the patient scoring a 16.  A GAD-7 screen was also performed with the patient scoring an 18.  Patient is alert and oriented x 4, calm, cooperative, and engaged in conversation during the encounter.  Patient endorses depressed mood.  Patient denies suicidal or homicidal ideations.  She further denies auditory or visual hallucinations and does not appear to be responding to internal/external stimuli.  Patient endorses increased sleep and receives on average 8 hours of sleep each night but states that she needs more.  Patient endorses fair appetite and eats on average 2 meals per day.  Patient denies alcohol consumption, tobacco use, and illicit drug use.  Visit Diagnosis:    ICD-10-CM   1. Chronic anxiety  F41.9 mirtazapine (REMERON) 7.5 MG tablet    hydrOXYzine (ATARAX) 25 MG tablet    2. Bipolar 1 disorder (HCC)  F31.9 mirtazapine (REMERON) 7.5 MG tablet    ARIPiprazole (ABILIFY) 20 MG tablet    lamoTRIgine (LAMICTAL) 100 MG tablet  3. Chronic post-traumatic stress disorder (PTSD)  F43.12 mirtazapine (REMERON) 7.5 MG tablet      Past Psychiatric History:  Bipolar disorder Generalized anxiety disorder Visual hallucinations Chronic PTSD  Past Medical History:  Past Medical History:  Diagnosis Date   Anxiety    Bipolar disorder ([place])    Chronic UTI    Depression     Endometriosis    HSV (herpes simplex virus) anogenital infection    PTSD (post-traumatic stress disorder)     Past Surgical History:  Procedure Laterality Date   CESAREAN SECTION N/A 02/17/2018   Procedure: CESAREAN SECTION;  Surgeon: Sanjuana Kava, MD;  Location: [facility];  Service: Obstetrics;  Laterality: N/A;   COLONOSCOPY     DIAGNOSTIC LAPAROSCOPY  11/05/2012   DILATION AND EVACUATION N/A 03/11/2015   Procedure: DILATATION AND EVACUATION;  Surgeon: Waymon Amato, MD;  Location: [facility];  Service: Gynecology;  Laterality: N/A;   DRUG INDUCED ENDOSCOPY     LAPAROSCOPY     LAPAROSCOPY N/A 06/07/2021   Procedure: LAPAROSCOPY OPERATIVE with /EXCISION OF LESIONS and lysis of adhesions;  Surgeon: Waymon Amato, MD;  Location: [facility];  Service: Gynecology;  Laterality: N/A;   TONSILLECTOMY  11/05/2013    Family Psychiatric History:  Mother - Major Depressive Disorder Sister - Obsessive Compulsive Disorder Aunt (maternal) - Bipolar I Disorder Uncle (maternal) - Bipolar I Disorder  Family History:  Family History  Problem Relation Age of Onset   Rheum arthritis Mother    Cancer Father     Social History:  Social History   Socioeconomic History   Marital status: Married    Spouse name: Dariany Graunke   Number of children: 1   Years of education: Not on file   Highest education level: Some college, no degree  Occupational History   Not on file  Tobacco Use   Smoking status: Every Day    Types: E-cigarettes   Smokeless tobacco: Never   Tobacco comments:    PT VAPES DAILY, '' I vape a lot ''   Vaping Use   Vaping Use: Every day   Substances: Nicotine, Flavoring  Substance and Sexual Activity   Alcohol use: Not Currently   Drug use: Not Currently    Types: Heroin, Cocaine, Marijuana    Comment: Clean since Aug 2015   Sexual activity: Yes    Partners: Male  Other Topics Concern   Not on file  Social History Narrative   Not on file   Social Determinants of Health    Financial Resource Strain: Medium Risk (03/07/2022)   Overall Financial Resource Strain (CARDIA)    Difficulty of Paying Living Expenses: Somewhat hard  Food Insecurity: No Food Insecurity (03/07/2022)   Hunger Vital Sign    Worried About Running Out of Food in the Last Year: Never true    [place] in the Last Year: Never true  Transportation Needs: No Transportation Needs (03/07/2022)   PRAPARE - Hydrologist (Medical): No    Lack of Transportation (Non-Medical): No  Physical Activity: Sufficiently Active (03/07/2022)   Exercise Vital Sign    Days of Exercise per Week: 3 days    Minutes of Exercise per Session: 60 min  Stress: Stress Concern Present (03/07/2022)   [place]    Feeling of Stress : Rather much  Social Connections: Moderately Isolated (03/07/2022)   Social Connection and Isolation Panel [NHANES]    Frequency  of Communication with Friends and Family: More than three times a week    Frequency of Social Gatherings with Friends and Family: More than three times a week    Attends Religious Services: Never    Marine scientist or Organizations: Yes    Attends Music therapist: More than 4 times per year    Marital Status: Separated    Allergies:  Allergies  Allergen Reactions   Ciprofloxacin Hives   Wound Dressing Adhesive Rash    Metabolic Disorder Labs: Lab Results  Component Value Date   HGBA1C 4.6 (L) 07/02/2022   MPG 85.32 07/02/2022   MPG 85.32 09/11/2020   No results found for: "PROLACTIN" Lab Results  Component Value Date   CHOL 117 07/02/2022   TRIG 94 07/02/2022   HDL 29 (L) 07/02/2022   CHOLHDL 4.0 07/02/2022   VLDL 19 07/02/2022   LDLCALC 69 07/02/2022   LDLCALC 78 05/09/2021   Lab Results  Component Value Date   TSH 0.519 07/02/2022   TSH 0.784 09/11/2020    Therapeutic Level Labs: Lab Results  Component Value Date    LITHIUM 0.27 (L) 07/06/2022   No results found for: "VALPROATE" No results found for: "CBMZ"  Current Medications: Current Outpatient Medications  Medication Sig Dispense Refill   acetaminophen (TYLENOL) 325 MG tablet Take 2 tablets (650 mg total) by mouth every 6 (six) hours as needed for mild pain or moderate pain. 30 tablet 1   ARIPiprazole (ABILIFY) 20 MG tablet Take 1 tablet (20 mg total) by mouth daily. 30 tablet 1   gabapentin (NEURONTIN) 300 MG capsule Take 2 capsules (600 mg total) by mouth 3 (three) times daily. 180 capsule 1   hydrOXYzine (ATARAX) 25 MG tablet Take 1 tablet (25 mg total) by mouth 3 (three) times daily as needed for anxiety. 75 tablet 1   lamoTRIgine (LAMICTAL) 100 MG tablet Take 1 tablet (100 mg total) by mouth 2 (two) times daily. 60 tablet 1   lithium carbonate 300 MG capsule Take 1 capsule (300 mg total) by mouth 2 (two) times daily with a meal. 60 capsule 2   medroxyPROGESTERone (DEPO-PROVERA) 150 MG/ML injection Inject 150 mg into the muscle every 3 (three) months. Next dose due 07/03/22     mirtazapine (REMERON) 7.5 MG tablet Take 1 tablet (7.5 mg total) by mouth at bedtime. 30 tablet 1   Multiple Vitamins-Minerals (MULTIVITAMIN WITH MINERALS) tablet Take 1 tablet by mouth daily.     No current facility-administered medications for this visit.     Musculoskeletal: Strength & Muscle Tone: within normal limits Gait & Station: normal Patient leans: N/A  Psychiatric Specialty Exam: Review of Systems  Psychiatric/Behavioral:  Positive for sleep disturbance. Negative for decreased concentration, dysphoric mood, hallucinations, self-injury and suicidal ideas. The patient is nervous/anxious. The patient is not hyperactive.     There were no vitals taken for this visit.There is no height or weight on file to calculate BMI.  General Appearance: Casual  Eye Contact:  Good  Speech:  Clear and Coherent and Normal Rate  Volume:  Normal  Mood:  Anxious and  Depressed  Affect:  Congruent  Thought Process:  Coherent, Goal Directed, and Descriptions of Associations: Intact  Orientation:  Full (Time, Place, and Person)  Thought Content: WDL   Suicidal Thoughts:  No  Homicidal Thoughts:  No  Memory:  Immediate;   Good Recent;   Good Remote;   Good  Judgement:  Good  Insight:  Good  Psychomotor Activity:  Normal  Concentration:  Concentration: Good and Attention Span: Good  Recall:  Good  Fund of Knowledge: Good  Language: Good  Akathisia:  No  Handed:  Ambidextrous  AIMS (if indicated): not done  Assets:  Communication Skills Desire for Improvement Housing Social Support Vocational/Educational  ADL's:  Intact  Cognition: WNL  Sleep:  Fair   Screenings: AIMS    [place] Admission (Discharged) from OP Visit from 07/01/2022 in [facility] [facility] [facility] Admission (Discharged) from OP Visit from 09/10/2020 in [place] [facility] Admission (Discharged) from OP Visit from 11/12/2019 in [facility] [facility]  AIMS Total Score 0 0 0      AUDIT    Flowsheet Row Admission (Discharged) from OP Visit from 07/01/2022 in [facility] [facility] Admission (Discharged) from OP Visit from 09/10/2020 in [facility] [facility] Admission (Discharged) from OP Visit from 11/12/2019 in [facility] [facility]  Alcohol Use Disorder Identification Test Final Score (AUDIT) 0 0 0      GAD-7    Flowsheet Row Video Visit from 12/26/2022 in [facility] Video Visit from 11/13/2022 in [facility] Video Visit from 07/17/2022 in [facility] Counselor from 03/07/2022 in [facility] Video Visit from 03/06/2022 in [facility]  Total GAD-7 Score '18 21 19 9 8      '$ PHQ2-9    Flowsheet Row Video  Visit from 12/26/2022 in [facility] Video Visit from 11/13/2022 in [facility] Counselor from 09/19/2022 in [facility] Video Visit from 07/17/2022 in [facility] Counselor from 03/07/2022 in [place]  PHQ-2 Total Score '6 6 2 4 3  '$ PHQ-9 Total Score '16 19 6 16 14      '$ Flowsheet Row Video Visit from 12/26/2022 in [facility] Video Visit from 11/13/2022 in [facility] Video Visit from 07/17/2022 in [place] Moderate Risk Moderate Risk High Risk        Assessment and Plan:   Karen [name] is a 32 year old, Caucasian female with a past psychiatric history significant for chronic anxiety, bipolar 1 disorder, and chronic PTSD who presents to [facility] via virtual video visit for follow-up and medication management.  Patient presents today stating that her depression has worsened since her last encounter.  Patient endorses the following depressive symptoms: decreased energy, irritability, crying spells, lack of motivation, and disinterest in activities/hobbies.  Patient also endorses anxiety which is attributed to her depression.  Patient was recommended increasing her Abilify from 15 mg to 20 mg daily to manage her depressive symptoms and for mood stability.  Patient is agreeable to recommendation.  Provider considered adjusting patient's mirtazapine; however, patient refused stating that mirtazapine has a tendency to make her feel groggy in the morning.  Patient's medications to be e-prescribed to pharmacy of choice.  Collaboration of Care: Collaboration of Care: Medication Management AEB provider managing patient's psychiatric medications, Psychiatrist AEB patient being followed by mental health provider at  this facility, and Referral or follow-up with counselor/therapist AEB patient being seen by a licensed clinical social worker at this facility  Patient/Guardian was advised Release of Information must be obtained prior to any record release  in order to collaborate their care with an outside provider. Patient/Guardian was advised if they have not already done so to contact the registration department to sign all necessary forms in order for [place] to release information regarding their care.   Consent: Patient/Guardian gives verbal consent for treatment and assignment of benefits for services provided during this visit. Patient/Guardian expressed understanding and agreed to proceed.   1. Chronic anxiety  - mirtazapine (REMERON) 7.5 MG tablet; Take 1 tablet (7.5 mg total) by mouth at bedtime.  Dispense: 30 tablet; Refill: 1 - hydrOXYzine (ATARAX) 25 MG tablet; Take 1 tablet (25 mg total) by mouth 3 (three) times daily as needed for anxiety.  Dispense: 75 tablet; Refill: 1  2. Bipolar 1 disorder (HCC)  - mirtazapine (REMERON) 7.5 MG tablet; Take 1 tablet (7.5 mg total) by mouth at bedtime.  Dispense: 30 tablet; Refill: 1 - ARIPiprazole (ABILIFY) 20 MG tablet; Take 1 tablet (20 mg total) by mouth daily.  Dispense: 30 tablet; Refill: 1 - lamoTRIgine (LAMICTAL) 100 MG tablet; Take 1 tablet (100 mg total) by mouth 2 (two) times daily.  Dispense: 60 tablet; Refill: 1  3. Chronic post-traumatic stress disorder (PTSD)  - mirtazapine (REMERON) 7.5 MG tablet; Take 1 tablet (7.5 mg total) by mouth at bedtime.  Dispense: 30 tablet; Refill: 1  Patient to follow up in 6 weeks Provider spent a total of 11 minute with the patient/reviewing the patient's chart  [name], PA 12/26/2022, 6:59 PM

## 2022-12-27 ENCOUNTER — Other Ambulatory Visit: Payer: Self-pay

## 2023-01-01 ENCOUNTER — Ambulatory Visit (INDEPENDENT_AMBULATORY_CARE_PROVIDER_SITE_OTHER): Payer: Self-pay | Admitting: Licensed Clinical Social Worker

## 2023-01-01 DIAGNOSIS — F319 Bipolar disorder, unspecified: Secondary | ICD-10-CM
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
## 2023-01-02 ENCOUNTER — Ambulatory Visit (HOSPITAL_COMMUNITY): Payer: No Payment, Other | Admitting: Licensed Clinical Social Worker

## 2023-01-14 ENCOUNTER — Ambulatory Visit (HOSPITAL_COMMUNITY): Payer: No Payment, Other | Admitting: Licensed Clinical Social Worker

## 2023-01-14 DIAGNOSIS — F319 Bipolar disorder, unspecified: Secondary | ICD-10-CM | POA: Diagnosis not present
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
## 2023-01-21 ENCOUNTER — Ambulatory Visit (HOSPITAL_COMMUNITY): Payer: Self-pay | Admitting: Licensed Clinical Social Worker

## 2023-01-22 ENCOUNTER — Other Ambulatory Visit: Payer: Self-pay

## 2023-01-22 ENCOUNTER — Other Ambulatory Visit (HOSPITAL_COMMUNITY): Payer: Self-pay

## 2023-01-29 ENCOUNTER — Ambulatory Visit (INDEPENDENT_AMBULATORY_CARE_PROVIDER_SITE_OTHER): Payer: No Payment, Other | Admitting: Licensed Clinical Social Worker

## 2023-01-29 DIAGNOSIS — F319 Bipolar disorder, unspecified: Secondary | ICD-10-CM | POA: Diagnosis not present

## 2023-01-29 NOTE — Progress Notes (Addendum)
THERAPIST PROGRESS NOTE Virtual Visit via Video Note  I connected with Karen [name] on 01/29/23 at  2:00 PM EDT by a video enabled telemedicine application and verified that I am speaking with the correct person using two identifiers.  Location: Patient: Pt fathers Home  Provider: Providers Home    I discussed the limitations of evaluation and management by telemedicine and the availability of in person appointments. The patient expressed understanding and agreed to proceed.    I discussed the assessment and treatment plan with the patient. The patient was provided an opportunity to ask questions and all were answered. The patient agreed with the plan and demonstrated an understanding of the instructions.   The patient was advised to call back or seek an in-person evaluation if the symptoms worsen or if the condition fails to improve as anticipated.  I provided 55 minutes of non-face-to-face time during this encounter.   Dory Horn, [name]   Participation Level: Active  Behavioral Response: CasualAlertAnxious and Depressed  Type of Therapy: Individual Therapy  Treatment Goals addressed:  Active     Bipolar Disorder CCP Problem  1 bipolar 1 disorder       walk 3 x weekly or workout  (Progressing)     Start:  03/07/22    Expected End:  04/05/23         maintain 40 hour a week employment  (Completed/Met)     Start:  03/07/22    Expected End:  10/04/22    Resolved:  08/08/22      LTG: Stabilize mood and increase goal-directed behavior: decrease PHQ-9 below 10  (Not Progressing)     Start:  03/07/22    Expected End:  04/05/23         STG: Karen [name] WILL COOPERATE WITH A PSYCHIATRIC EVALUATION ON 10/05/2020  AND DURING ALL FOLLOW-UP VISITS (Completed/Met)     Start:  03/07/22    Expected End:  10/04/22    Resolved:  03/26/22      STG: Karen [name] WILL IDENTIFY COGNITIVE PATTERNS AND BELIEFS THAT INTERFERE WITH THERAPY (Progressing)     Start:  03/07/22     Expected End:  04/05/23          decrease GAD-7 below 5  (Not Progressing)     Start:  03/07/22    Expected End:  04/05/23               ProgressTowards Goals: Progressing  Interventions: CBT, Motivational Interviewing, and Supportive  Suicidal/Homicidal: Nowithout intent/plan  Therapist Response:     Pt was alert and oriented x 5. She was dressed casually and engaged well in therapy session. She presented with depressed and anxious mood/affect. Karen [name] was pleasant, cooperative and maintained good eye contact.   Primary stressor is family conflict. Pt did session today from her father's house in [place]. Karen [name] states that her father brain cancer has continued to progress, and they are brining in hospice. Pt reports that he is refusing all medications including pain medication as he believes this will prolong his life. Pt reports frustration, tension, and worry since this has been ongoing for the past two years. Karen [name] states "I am mentally exhausted".   Interventions/Plan: [name] educated pt on taking medications as prescribed. [name] educated pt on the symptoms of depression. [name] educated pt today on the stages of grief. [name] used psychoanalytic therapy for pt to express thoughts, feelings, and concerns in session. [name] used supportive therapy for praise and encouragement.  Plan: Return again in 3 weeks.  Diagnosis: Bipolar 1 disorder ([place])  Collaboration of Care: Other None today   Patient/Guardian was advised Release of Information must be obtained prior to any record release in order to collaborate their care with an outside provider. Patient/Guardian was advised if they have not already done so to contact the registration department to sign all necessary forms in order for [place] to release information regarding their care.   Consent: Patient/Guardian gives verbal consent for treatment and assignment of benefits for services provided during this visit. Patient/Guardian  expressed understanding and agreed to proceed.   Dory Horn, [name] 01/29/2023

## 2023-01-30 ENCOUNTER — Ambulatory Visit (HOSPITAL_COMMUNITY): Payer: Self-pay | Admitting: Licensed Clinical Social Worker

## 2023-02-06 ENCOUNTER — Other Ambulatory Visit: Payer: Self-pay

## 2023-02-06 ENCOUNTER — Telehealth (INDEPENDENT_AMBULATORY_CARE_PROVIDER_SITE_OTHER): Payer: No Payment, Other | Admitting: Physician Assistant

## 2023-02-06 DIAGNOSIS — F419 Anxiety disorder, unspecified: Secondary | ICD-10-CM | POA: Diagnosis not present

## 2023-02-06 DIAGNOSIS — F4312 Post-traumatic stress disorder, chronic: Secondary | ICD-10-CM

## 2023-02-06 DIAGNOSIS — F319 Bipolar disorder, unspecified: Secondary | ICD-10-CM | POA: Diagnosis not present

## 2023-02-06 MED ORDER — GABAPENTIN 300 MG PO CAPS
600.0000 mg | ORAL_CAPSULE | Freq: Three times a day (TID) | ORAL | 1 refills | Status: DC
Start: 2023-02-06 — End: 2023-07-10
  Filled 2023-02-06: qty 180, 30d supply, fill #0

## 2023-02-06 MED ORDER — HYDROXYZINE HCL 25 MG PO TABS
25.0000 mg | ORAL_TABLET | Freq: Three times a day (TID) | ORAL | 1 refills | Status: DC | PRN
Start: 2023-02-06 — End: 2024-06-26
  Filled 2023-02-06: qty 75, 25d supply, fill #0

## 2023-02-06 MED ORDER — ARIPIPRAZOLE 20 MG PO TABS
20.0000 mg | ORAL_TABLET | Freq: Every day | ORAL | 1 refills | Status: DC
Start: 2023-02-06 — End: 2023-06-18
  Filled 2023-02-06 – 2023-03-13 (×2): qty 30, 30d supply, fill #0
  Filled 2023-04-19: qty 30, 30d supply, fill #1

## 2023-02-06 MED ORDER — MIRTAZAPINE 7.5 MG PO TABS
7.5000 mg | ORAL_TABLET | Freq: Every day | ORAL | 1 refills | Status: DC
  Filled 2023-02-06 – 2023-03-13 (×2): qty 30, 30d supply, fill #0
  Filled 2023-05-27: qty 30, 30d supply, fill #1

## 2023-02-06 MED ORDER — LAMOTRIGINE 100 MG PO TABS
100.0000 mg | ORAL_TABLET | Freq: Two times a day (BID) | ORAL | 1 refills | Status: DC
Start: 2023-02-06 — End: 2023-07-02
  Filled 2023-02-06 – 2023-03-13 (×2): qty 60, 30d supply, fill #0
  Filled 2023-05-27: qty 60, 30d supply, fill #1

## 2023-02-06 NOTE — Progress Notes (Signed)
[place] MD/PA/NP OP Progress Note  Virtual Visit via Video Note  I connected with Karen [name] on 02/07/23 at  3:00 PM EDT by a video enabled telemedicine application and verified that I am speaking with the correct person using two identifiers.  Location: Patient: Home Provider: Clinic   I discussed the limitations of evaluation and management by telemedicine and the availability of in person appointments. The patient expressed understanding and agreed to proceed.  Follow Up Instructions:  I discussed the assessment and treatment plan with the patient. The patient was provided an opportunity to ask questions and all were answered. The patient agreed with the plan and demonstrated an understanding of the instructions.   The patient was advised to call back or seek an in-person evaluation if the symptoms worsen or if the condition fails to improve as anticipated.  I provided 13 minutes of non-face-to-face time during this encounter.  [name], PA    02/07/2023 2:57 PM Karen [name]  MRN:  [id number]  Chief Complaint:  No chief complaint on file.  HPI:   Karen [name] is a 32 year old, Caucasian female with a past psychiatric history significant for chronic anxiety, bipolar 1 disorder, and chronic PTSD who presents to [facility] via virtual video visit for follow-up and medication management.  Patient is currently being managed on the following psychiatric medications:  Mirtazapine 7.5 mg at bedtime Gabapentin 600 mg 3 times daily Hydroxyzine 25 mg 3 times daily as needed Lamotrigine 100 mg 2 times daily Abilify 15 mg daily  Patient presents to the encounter stating that she continues to have issues with her anxiety.  Patient's anxiety is so bad that she denies relief from the use of her gabapentin.  Patient rates her anxiety at 9 out of 10 and states that she always feels on edge.  Patient has utilized meditation and  breathing exercises for the management of her anxiety but has found no relief.  Patient reports that her depression appears to be manageable at this time.  A PHQ-9 screen was performed with the patient scoring a 6.  A GAD-7 screen was also performed with the patient scoring a 19.  Patient is alert and oriented x 4, calm, cooperative, and engaged in conversation during the encounter.  Patient describes her mood as anxious.  Patient denies suicidal or homicidal ideations.  She further denies auditory or visual hallucinations and does not appear to be responding to internal/external stimuli.  Patient endorses good sleep and receives on average 8 hours of sleep each night.  Patient endorses good appetite and eats on average 2-3 meals per day.  Patient denies alcohol consumption, tobacco use, illicit drug use.  Visit Diagnosis:    ICD-10-CM   1. Chronic anxiety  F41.9 mirtazapine (REMERON) 7.5 MG tablet    gabapentin (NEURONTIN) 300 MG capsule    hydrOXYzine (ATARAX) 25 MG tablet    2. Bipolar 1 disorder  F31.9 mirtazapine (REMERON) 7.5 MG tablet    ARIPiprazole (ABILIFY) 20 MG tablet    lamoTRIgine (LAMICTAL) 100 MG tablet    3. Chronic post-traumatic stress disorder (PTSD)  F43.12 mirtazapine (REMERON) 7.5 MG tablet      Past Psychiatric History:  Bipolar disorder Generalized anxiety disorder Visual hallucinations Chronic PTSD  Past Medical History:  Past Medical History:  Diagnosis Date   Anxiety    Bipolar disorder ([place])    Chronic UTI    Depression    Endometriosis    HSV (  herpes simplex virus) anogenital infection    PTSD (post-traumatic stress disorder)     Past Surgical History:  Procedure Laterality Date   CESAREAN SECTION N/A 02/17/2018   Procedure: CESAREAN SECTION;  Surgeon: Sanjuana Kava, MD;  Location: [facility];  Service: Obstetrics;  Laterality: N/A;   COLONOSCOPY     DIAGNOSTIC LAPAROSCOPY  11/05/2012   DILATION AND EVACUATION N/A 03/11/2015   Procedure:  DILATATION AND EVACUATION;  Surgeon: Waymon Amato, MD;  Location: [facility];  Service: Gynecology;  Laterality: N/A;   DRUG INDUCED ENDOSCOPY     LAPAROSCOPY     LAPAROSCOPY N/A 06/07/2021   Procedure: LAPAROSCOPY OPERATIVE with /EXCISION OF LESIONS and lysis of adhesions;  Surgeon: Waymon Amato, MD;  Location: [facility];  Service: Gynecology;  Laterality: N/A;   TONSILLECTOMY  11/05/2013    Family Psychiatric History:  Mother - Major Depressive Disorder Sister - Obsessive Compulsive Disorder Aunt (maternal) - Bipolar I Disorder Uncle (maternal) - Bipolar I Disorder  Family History:  Family History  Problem Relation Age of Onset   Rheum arthritis Mother    Cancer Father     Social History:  Social History   Socioeconomic History   Marital status: Married    Spouse name: Seline Adamiak   Number of children: 1   Years of education: Not on file   Highest education level: Some college, no degree  Occupational History   Not on file  Tobacco Use   Smoking status: Every Day    Types: E-cigarettes   Smokeless tobacco: Never   Tobacco comments:    PT VAPES DAILY, '' I vape a lot ''   Vaping Use   Vaping Use: Every day   Substances: Nicotine, Flavoring  Substance and Sexual Activity   Alcohol use: Not Currently   Drug use: Not Currently    Types: Heroin, Cocaine, Marijuana    Comment: Clean since Aug 2015   Sexual activity: Yes    Partners: Male  Other Topics Concern   Not on file  Social History Narrative   Not on file   Social Determinants of Health   Financial Resource Strain: Medium Risk (03/07/2022)   Overall Financial Resource Strain (CARDIA)    Difficulty of Paying Living Expenses: Somewhat hard  Food Insecurity: No Food Insecurity (03/07/2022)   Hunger Vital Sign    Worried About Running Out of Food in the Last Year: Never true    [place] in the Last Year: Never true  Transportation Needs: No Transportation Needs (03/07/2022)   PRAPARE - Radiographer, therapeutic (Medical): No    Lack of Transportation (Non-Medical): No  Physical Activity: Sufficiently Active (03/07/2022)   Exercise Vital Sign    Days of Exercise per Week: 3 days    Minutes of Exercise per Session: 60 min  Stress: Stress Concern Present (03/07/2022)   [place]    Feeling of Stress : Rather much  Social Connections: Moderately Isolated (03/07/2022)   Social Connection and Isolation Panel [NHANES]    Frequency of Communication with Friends and Family: More than three times a week    Frequency of Social Gatherings with Friends and Family: More than three times a week    Attends Religious Services: Never    Marine scientist or Organizations: Yes    Attends Music therapist: More than 4 times per year    Marital Status: Separated  Allergies:  Allergies  Allergen Reactions   Ciprofloxacin Hives   Wound Dressing Adhesive Rash    Metabolic Disorder Labs: Lab Results  Component Value Date   HGBA1C 4.6 (L) 07/02/2022   MPG 85.32 07/02/2022   MPG 85.32 09/11/2020   No results found for: "PROLACTIN" Lab Results  Component Value Date   CHOL 117 07/02/2022   TRIG 94 07/02/2022   HDL 29 (L) 07/02/2022   CHOLHDL 4.0 07/02/2022   VLDL 19 07/02/2022   LDLCALC 69 07/02/2022   LDLCALC 78 05/09/2021   Lab Results  Component Value Date   TSH 0.519 07/02/2022   TSH 0.784 09/11/2020    Therapeutic Level Labs: Lab Results  Component Value Date   LITHIUM 0.27 (L) 07/06/2022   No results found for: "VALPROATE" No results found for: "CBMZ"  Current Medications: Current Outpatient Medications  Medication Sig Dispense Refill   acetaminophen (TYLENOL) 325 MG tablet Take 2 tablets (650 mg total) by mouth every 6 (six) hours as needed for mild pain or moderate pain. 30 tablet 1   ARIPiprazole (ABILIFY) 20 MG tablet Take 1 tablet (20 mg total) by mouth daily. 30 tablet 1   gabapentin  (NEURONTIN) 300 MG capsule Take 2 capsules (600 mg total) by mouth 3 (three) times daily. 180 capsule 1   hydrOXYzine (ATARAX) 25 MG tablet Take 1 tablet (25 mg total) by mouth 3 (three) times daily as needed for anxiety. 75 tablet 1   lamoTRIgine (LAMICTAL) 100 MG tablet Take 1 tablet (100 mg total) by mouth 2 (two) times daily. 60 tablet 1   medroxyPROGESTERone (DEPO-PROVERA) 150 MG/ML injection Inject 150 mg into the muscle every 3 (three) months. Next dose due 07/03/22     mirtazapine (REMERON) 7.5 MG tablet Take 1 tablet (7.5 mg total) by mouth at bedtime. 30 tablet 1   Multiple Vitamins-Minerals (MULTIVITAMIN WITH MINERALS) tablet Take 1 tablet by mouth daily.     No current facility-administered medications for this visit.     Musculoskeletal: Strength & Muscle Tone: within normal limits Gait & Station: normal Patient leans: N/A  Psychiatric Specialty Exam: Review of Systems  Psychiatric/Behavioral:  Negative for decreased concentration, dysphoric mood, hallucinations, self-injury, sleep disturbance and suicidal ideas. The patient is nervous/anxious. The patient is not hyperactive.     There were no vitals taken for this visit.There is no height or weight on file to calculate BMI.  General Appearance: Casual  Eye Contact:  Good  Speech:  Clear and Coherent and Normal Rate  Volume:  Normal  Mood:  Anxious and Euthymic  Affect:  Congruent  Thought Process:  Coherent, Goal Directed, and Descriptions of Associations: Intact  Orientation:  Full (Time, Place, and Person)  Thought Content: WDL   Suicidal Thoughts:  No  Homicidal Thoughts:  No  Memory:  Immediate;   Good Recent;   Good Remote;   Good  Judgement:  Good  Insight:  Good  Psychomotor Activity:  Normal  Concentration:  Concentration: Good and Attention Span: Good  Recall:  Good  Fund of Knowledge: Good  Language: Good  Akathisia:  No  Handed:  Ambidextrous  AIMS (if indicated): not done  Assets:  Communication  Skills Desire for Improvement Housing Social Support Vocational/Educational  ADL's:  Intact  Cognition: WNL  Sleep:  Fair   Screenings: AIMS    [place] Admission (Discharged) from OP Visit from 07/01/2022 in [facility] [facility] Admission (Discharged) from OP Visit from 09/10/2020 in [place]  [place] [facility] Admission (Discharged) from OP Visit from 11/12/2019 in [place] [facility]  AIMS Total Score 0 0 0      AUDIT    Flowsheet Row Admission (Discharged) from OP Visit from 07/01/2022 in [place] [facility] Admission (Discharged) from OP Visit from 09/10/2020 in [place] [facility] Admission (Discharged) from OP Visit from 11/12/2019 in [place] [facility]  Alcohol Use Disorder Identification Test Final Score (AUDIT) 0 0 0      GAD-7    Flowsheet Row Video Visit from 02/06/2023 in [facility] Video Visit from 12/26/2022 in [facility] Video Visit from 11/13/2022 in [facility] Video Visit from 07/17/2022 in [facility] Counselor from 03/07/2022 in [facility]  Total GAD-7 Score 19 18 21 19 9       PHQ2-9    Flowsheet Row Video Visit from 02/06/2023 in [facility] Video Visit from 12/26/2022 in [facility] Video Visit from 11/13/2022 in [facility] Counselor from 09/19/2022 in [facility] Video Visit from 07/17/2022 in [place]  PHQ-2 Total Score 2 6 6 2 4   PHQ-9 Total Score 6 16 19 6 16       Flowsheet Row Video Visit from 02/06/2023 in [facility] Video Visit from 12/26/2022 in [facility] Video Visit  from 11/13/2022 in [place] CATEGORY Moderate Risk Moderate Risk Moderate Risk        Assessment and Plan:   Karen [name] is a 32 year old, Caucasian female with a past psychiatric history significant for chronic anxiety, bipolar 1 disorder, and chronic PTSD who presents to [facility] via virtual video visit for follow-up and medication management.  Patient presents today encounter expressing issues with her anxiety.  Patient has found no relief in the management of her anxiety through the use of her medication (gabapentin) or the use of coping skills such as meditation and breathing exercises.  Patient has been on a number of anxiolytic medications in the past with no relief from her.  Patient does report that she has been on a benzodiazepine for the management of her anxiety; however, she was taken off the medication after visiting the hospital due to past history of substance abuse.  Due to patient being on numerous medications in the past, provider may consider placing patient on a benzodiazepine medication for short-term.  Provider to discuss options with the patient during follow-up.  Patient to continue taking other medications as prescribed.  Collaboration of Care: Collaboration of Care: Medication Management AEB provider managing patient's psychiatric medications, Psychiatrist AEB patient being followed by mental health provider at this facility, and Referral or follow-up with counselor/therapist AEB patient being seen by a licensed clinical social worker at this facility  Patient/Guardian was advised Release of Information must be obtained prior to any record release in order to collaborate their care with an outside provider. Patient/Guardian was advised if they have not already done so to contact the registration department to sign all necessary forms in order for [place] to release information regarding  their care.   Consent: Patient/Guardian gives verbal consent for treatment and assignment of benefits for services provided during this visit. Patient/Guardian expressed understanding and agreed to  proceed.   1. Chronic anxiety  - mirtazapine (REMERON) 7.5 MG tablet; Take 1 tablet (7.5 mg total) by mouth at bedtime.  Dispense: 30 tablet; Refill: 1 - gabapentin (NEURONTIN) 300 MG capsule; Take 2 capsules (600 mg total) by mouth 3 (three) times daily.  Dispense: 180 capsule; Refill: 1 - hydrOXYzine (ATARAX) 25 MG tablet; Take 1 tablet (25 mg total) by mouth 3 (three) times daily as needed for anxiety.  Dispense: 75 tablet; Refill: 1  2. Bipolar 1 disorder  - mirtazapine (REMERON) 7.5 MG tablet; Take 1 tablet (7.5 mg total) by mouth at bedtime.  Dispense: 30 tablet; Refill: 1 - ARIPiprazole (ABILIFY) 20 MG tablet; Take 1 tablet (20 mg total) by mouth daily.  Dispense: 30 tablet; Refill: 1 - lamoTRIgine (LAMICTAL) 100 MG tablet; Take 1 tablet (100 mg total) by mouth 2 (two) times daily.  Dispense: 60 tablet; Refill: 1  3. Chronic post-traumatic stress disorder (PTSD)  - mirtazapine (REMERON) 7.5 MG tablet; Take 1 tablet (7.5 mg total) by mouth at bedtime.  Dispense: 30 tablet; Refill: 1  Patient to follow up in 6 weeks Provider spent a total of 13 minute with the patient/reviewing the patient's chart  [name], PA 02/07/2023, 2:57 PM

## 2023-02-07 ENCOUNTER — Encounter (HOSPITAL_COMMUNITY): Payer: Self-pay | Admitting: Physician Assistant

## 2023-02-07 ENCOUNTER — Other Ambulatory Visit: Payer: Self-pay

## 2023-02-19 ENCOUNTER — Telehealth (HOSPITAL_COMMUNITY): Payer: Self-pay | Admitting: Licensed Clinical Social Worker

## 2023-02-19 ENCOUNTER — Ambulatory Visit (INDEPENDENT_AMBULATORY_CARE_PROVIDER_SITE_OTHER): Payer: No Payment, Other | Admitting: Licensed Clinical Social Worker

## 2023-02-19 DIAGNOSIS — F319 Bipolar disorder, unspecified: Secondary | ICD-10-CM

## 2023-02-19 NOTE — Telephone Encounter (Signed)
Pt requesting a call back from medication provider. She states new anxiety medications was discussed but pharmacy does not have fill request sent in

## 2023-02-19 NOTE — Progress Notes (Signed)
THERAPIST PROGRESS NOTE  Virtual Visit via Video Note  I connected with Marilea Gwynne on 02/19/23 at 11:00 AM EDT by a video enabled telemedicine application and verified that I am speaking with the correct person using two identifiers.  Location: Patient: [facility]  Provider: Providers Home    I discussed the limitations of evaluation and management by telemedicine and the availability of in person appointments. The patient expressed understanding and agreed to proceed.     I discussed the assessment and treatment plan with the patient. The patient was provided an opportunity to ask questions and all were answered. The patient agreed with the plan and demonstrated an understanding of the instructions.   The patient was advised to call back or seek an in-person evaluation if the symptoms worsen or if the condition fails to improve as anticipated.  I provided 45 minutes of non-face-to-face time during this encounter.   [name], [name]   Participation Level: Active  Behavioral Response: CasualAlertAnxious and Depressed  Type of Therapy: Individual Therapy  Treatment Goals addressed:  Active     Bipolar Disorder CCP Problem  1 bipolar 1 disorder       walk 3 x weekly or workout  (Progressing)     Start:  03/07/22    Expected End:  04/05/23         maintain 40 hour a week employment  (Completed/Met)     Start:  03/07/22    Expected End:  10/04/22    Resolved:  08/08/22      LTG: Stabilize mood and increase goal-directed behavior: decrease PHQ-9 below 10  (Not Progressing)     Start:  03/07/22    Expected End:  04/05/23         STG: [name] WILL COOPERATE WITH A PSYCHIATRIC EVALUATION ON 10/05/2020  AND DURING ALL FOLLOW-UP VISITS (Completed/Met)     Start:  03/07/22    Expected End:  10/04/22    Resolved:  03/26/22      STG: [name] WILL IDENTIFY COGNITIVE PATTERNS AND BELIEFS THAT INTERFERE WITH THERAPY (Progressing)     Start:  03/07/22     Expected End:  04/05/23       Goal Note     Cognitive distortion such as magical thinking "[name] is at daycare he is going to get hurt"           decrease GAD-7 below 5  (Not Progressing)     Start:  03/07/22    Expected End:  04/05/23               ProgressTowards Goals: Progressing  Interventions: CBT, Motivational Interviewing, and Supportive    Suicidal/Homicidal: Nowithout intent/plan  Therapist Response:   Pt was alert and oriented x 5. She was dressed casually and engaged well in therapy session. She presented with depressed and anxious mood/affect. [place] was pleasant, cooperative and maintained good eye contact.  Pt reports increase anxiety. She reports tension and worry and stating, "My anxiety is always at a 10". [name] challenged pt asking "has your anxiety ever been worse?" and Pt replied, "yes of course.". [name] then asked, "Is it really always a 10?" and Pt stated "No". [name] then educated on cognitive distortion such as "all or nothing thinking" or "Maximizing". [name] educated pt on signs and symptoms of anxiety. [name] spoke with pt on taking medications as prescribed. [name] educated pt on partializing things to help break things down into smaller bite size pieces.  Plan for  pt to journal and come up with one coping skill to help decrease anxiety other than using medications.    Plan: Return again in 3 weeks.  Diagnosis: Bipolar 1 disorder ([facility])  Collaboration of Care: Other None today   Patient/Guardian was advised Release of Information must be obtained prior to any record release in order to collaborate their care with an outside provider. Patient/Guardian was advised if they have not already done so to contact the registration department to sign all necessary forms in order for [place] to release information regarding their care.   Consent: Patient/Guardian gives verbal consent for treatment and assignment of benefits for services provided during this visit.  Patient/Guardian expressed understanding and agreed to proceed.   [name], [name] 02/19/2023

## 2023-02-20 NOTE — Telephone Encounter (Signed)
Message acknowledged and reviewed.  Provider was able to reach out to patient to discuss new anxiety medication.  Provider informed patient that the medication that she was supposed to start taking was cross reactive with one of her current medications.  Due to the cross-reactivity, provider informed patient that she could not be placed on the anxiety medication that this provider had spoke about during her last encounter.  Provider recommended placing patient on samples of a new medication (Vraylar) for the management of her depressive symptoms and anxiety.  Patient was agreeable to recommendation.  Patient to present to facility to pick up 2 weeks samples.

## 2023-02-22 ENCOUNTER — Telehealth (HOSPITAL_COMMUNITY): Payer: Self-pay | Admitting: Physician Assistant

## 2023-02-22 ENCOUNTER — Other Ambulatory Visit (HOSPITAL_COMMUNITY): Payer: Self-pay | Admitting: Physician Assistant

## 2023-02-22 DIAGNOSIS — F419 Anxiety disorder, unspecified: Secondary | ICD-10-CM

## 2023-02-22 MED ORDER — CLONAZEPAM 0.5 MG PO TABS
0.2500 mg | ORAL_TABLET | Freq: Every day | ORAL | 0 refills | Status: DC | PRN
Start: 2023-02-22 — End: 2023-03-21

## 2023-02-22 NOTE — Progress Notes (Signed)
Provider contacted this patient in regards to her anxiety.  Patient endorses worsening anxiety for the past 3 to 4 days.  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  Patient states that her anxiety has also contributed to her inability to sleep.  Patient has been on numerous medications in the past and is currently on gabapentin 600 mg 3 times daily for the management of her anxiety.  Provider to place patient on a short course of clonazepam 0.25 mg daily as needed for the management of her anxiety.  Provider to prescribe eight 0.5 mg tablets of clonazepam and patient was instructed to only take half a tablet as needed for when her anxiety is unbearable.  Patient vocalized understanding.  Patient's medication to be e-prescribed to pharmacy of choice.  Patient was recently given a 2-week sample supply of Vraylar 1.5 mg.  Patient was informed that Leafy Kindle could potentially cause irritability.  Provider informed patient that if her irritability continues to persist then she could skip Vraylar for a day and then take the medication the following day.  Patient vocalized understanding.

## 2023-02-22 NOTE — Telephone Encounter (Signed)
Message acknowledged and reviewed.  Provider was able to reach out to patient to discuss concerns regarding the medication that she was recently placed on.  Provider also discussed with patient at length about her anxiety.

## 2023-03-11 ENCOUNTER — Telehealth (HOSPITAL_COMMUNITY): Payer: Self-pay | Admitting: Licensed Clinical Social Worker

## 2023-03-11 ENCOUNTER — Ambulatory Visit (INDEPENDENT_AMBULATORY_CARE_PROVIDER_SITE_OTHER): Payer: No Payment, Other | Admitting: Licensed Clinical Social Worker

## 2023-03-11 DIAGNOSIS — F411 Generalized anxiety disorder: Secondary | ICD-10-CM | POA: Insufficient documentation

## 2023-03-11 DIAGNOSIS — F319 Bipolar disorder, unspecified: Secondary | ICD-10-CM

## 2023-03-11 NOTE — Progress Notes (Signed)
THERAPIST PROGRESS NOTE  Virtual Visit via Video Note  I connected with Karen [name] on 03/11/23 at  1:00 PM EDT by a video enabled telemedicine application and verified that I am speaking with the correct person using two identifiers.  Location: Patient: [facility]  Provider: [facility]    I discussed the limitations of evaluation and management by telemedicine and the availability of in person appointments. The patient expressed understanding and agreed to proceed.\    I discussed the assessment and treatment plan with the patient. The patient was provided an opportunity to ask questions and all were answered. The patient agreed with the plan and demonstrated an understanding of the instructions.   The patient was advised to call back or seek an in-person evaluation if the symptoms worsen or if the condition fails to improve as anticipated.  I provided 45 minutes of non-face-to-face time during this encounter.   Karen Cooks, [name]   Participation Level: Active  Behavioral Response: CasualAlertAnxious and Depressed  Type of Therapy: Individual Therapy  Treatment Goals addressed:  Active     Bipolar Disorder CCP Problem  1 bipolar 1 disorder       walk 3 x weekly or workout  (Progressing)     Start:  03/07/22    Expected End:  04/05/23         maintain 40 hour a week employment  (Completed/Met)     Start:  03/07/22    Expected End:  10/04/22    Resolved:  08/08/22      LTG: Stabilize mood and increase goal-directed behavior: decrease PHQ-9 below 10  (Not Progressing)     Start:  03/07/22    Expected End:  04/05/23         STG: Karen [name] WILL COOPERATE WITH A PSYCHIATRIC EVALUATION ON 10/05/2020  AND DURING ALL FOLLOW-UP VISITS (Completed/Met)     Start:  03/07/22    Expected End:  10/04/22    Resolved:  03/26/22      STG: Karen [name] WILL IDENTIFY COGNITIVE PATTERNS AND BELIEFS THAT INTERFERE WITH THERAPY (Progressing)     Start:  03/07/22     Expected End:  04/05/23          decrease GAD-7 below 5  (Not Progressing)     Start:  03/07/22    Expected End:  04/05/23               ProgressTowards Goals: Progressing  Interventions: CBT, Motivational Interviewing, and Supportive  Summary: Karen [name] is a 32 y.o. female who presents with anxious and depressed mood\affect.  Patient was pleasant, cooperative, maintained good eye contact.  She engaged well in therapy session was dressed casually.  Patient comes in today with reported increased anxiety for tension, worry, and panic attacks.  Patient reports that she has attempted to contact medication providers times with no callback.  Patient reports that these message has been sent to the front of staff and they stated they contacted him.  [name] reviewed chart and no phone calls were documented.  [name] went ahead and used solution focused therapy today to message provider that callback is needed to talk about increasing anxiety as well as long-term acting anxiety medications.  Patient reports primary stressors as work.  Patient reports tension due to her responsibilities at work.  Patient reports reducing hours at work have not helped.  This is due to the fact that her responsibilities continue to increase without any incentive pay.  Suicidal/Homicidal: Nowithout intent/plan  Therapist Response:     Intervention/Plan: [name] spoke with patient about "tips for setting healthy boundaries".  [name] sent worksheet to patient's email.  [name] reviewed all tips indicated in sheet and asked patient to utilize one of the steps at least 2 times weekly.  [name] also send patient coping skills for anxiety.  [name] emailed that worksheet as well to patient.  Patient to practice coping skills at least 3 times weekly.  Patient reports exercise, meditation, and journaling as primary coping skills today.  [name] psychoanalytic therapy for patient to express thoughts, feelings and emotions.  [name] supportive  therapy for praise and encouragement.  Plan: Return again in 3 weeks.  Diagnosis: GAD (generalized anxiety disorder)  Bipolar 1 disorder (HCC)  Collaboration of Care: Other None today   Patient/Guardian was advised Release of Information must be obtained prior to any record release in order to collaborate their care with an outside provider. Patient/Guardian was advised if they have not already done so to contact the registration department to sign all necessary forms in order for [place] to release information regarding their care.   Consent: Patient/Guardian gives verbal consent for treatment and assignment of benefits for services provided during this visit. Patient/Guardian expressed understanding and agreed to proceed.   Karen Cooks, [name] 03/11/2023

## 2023-03-11 NOTE — Telephone Encounter (Signed)
Pt wants call back for questions and concerns in regards to anxiety medications and self reported anxiety increase.

## 2023-03-12 ENCOUNTER — Other Ambulatory Visit (HOSPITAL_COMMUNITY): Payer: Self-pay | Admitting: Physician Assistant

## 2023-03-12 DIAGNOSIS — F419 Anxiety disorder, unspecified: Secondary | ICD-10-CM

## 2023-03-12 NOTE — Telephone Encounter (Signed)
Message acknowledged and reviewed. Will contact patient to address concerns.

## 2023-03-12 NOTE — Progress Notes (Signed)
Provider was able to reach out to patient regarding concerns over anxiety.  Patient informed provider been struggling with more anxiety than normal.  Patient reports that she is often on the verge of tears due to her anxiety.  Patient reports that her anxiety is often accompanied by uncontrollable shaking and she has had to leave work early due to her involuntary shaking.  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  Patient reports that she still has leftover tablets of her clonazepam and states that the medication has been helpful with the shaking.  Patient reports that she has tried not to take the medication daily but notes that the medication has been the only thing that has been effective in managing her symptoms.  Provider informed patient to continue taking the medication as needed for her anxiety.  Provider encouraged patient to reach out to facility when she has run out of her prescription to discuss managing her anxiety for the future.  Patient vocalized understanding.

## 2023-03-12 NOTE — Telephone Encounter (Signed)
Message acknowledged and reviewed.

## 2023-03-13 ENCOUNTER — Other Ambulatory Visit (HOSPITAL_COMMUNITY): Payer: Self-pay

## 2023-03-13 ENCOUNTER — Other Ambulatory Visit: Payer: Self-pay

## 2023-03-21 ENCOUNTER — Telehealth (HOSPITAL_COMMUNITY): Payer: Self-pay | Admitting: Physician Assistant

## 2023-03-21 ENCOUNTER — Other Ambulatory Visit (HOSPITAL_COMMUNITY): Payer: Self-pay | Admitting: Physician Assistant

## 2023-03-21 DIAGNOSIS — F419 Anxiety disorder, unspecified: Secondary | ICD-10-CM

## 2023-03-21 MED ORDER — CLONAZEPAM 0.5 MG PO TABS
0.2500 mg | ORAL_TABLET | Freq: Every day | ORAL | 0 refills | Status: DC | PRN
Start: 2023-03-21 — End: 2023-07-10

## 2023-03-21 NOTE — Telephone Encounter (Signed)
Message acknowledged and reviewed.

## 2023-03-21 NOTE — Progress Notes (Signed)
Provider was contacted by [facility] regarding patient's request for medication refill.  Provider to refill patient's prescription and sent to preferred pharmacy.

## 2023-03-26 ENCOUNTER — Ambulatory Visit (INDEPENDENT_AMBULATORY_CARE_PROVIDER_SITE_OTHER): Payer: No Payment, Other | Admitting: Licensed Clinical Social Worker

## 2023-03-26 DIAGNOSIS — F319 Bipolar disorder, unspecified: Secondary | ICD-10-CM | POA: Diagnosis not present

## 2023-03-26 NOTE — Progress Notes (Signed)
THERAPIST PROGRESS NOTE  Virtual Visit via Video Note  I connected with Karen [name] on 03/26/23 at  2:00 PM EDT by a video enabled telemedicine application and verified that I am speaking with the correct person using two identifiers.  Location: Patient: [facility]  Provider: [facility]    I discussed the limitations of evaluation and management by telemedicine and the availability of in person appointments. The patient expressed understanding and agreed to proceed.     I discussed the assessment and treatment plan with the patient. The patient was provided an opportunity to ask questions and all were answered. The patient agreed with the plan and demonstrated an understanding of the instructions.   The patient was advised to call back or seek an in-person evaluation if the symptoms worsen or if the condition fails to improve as anticipated.  I provided of non-face-to-face time during this encounter.   Karen Cooks, [name]   Participation Level: Active  Behavioral Response: CasualAlertDepressed  Type of Therapy: Individual Therapy  Treatment Goals addressed:   Active     Bipolar Disorder CCP Problem  1 bipolar 1 disorder       walk 3 x weekly or workout  (Progressing)     Start:  03/07/22    Expected End:  04/05/23         maintain 40 hour a week employment  (Completed/Met)     Start:  03/07/22    Expected End:  10/04/22    Resolved:  08/08/22      LTG: Stabilize mood and increase goal-directed behavior: decrease PHQ-9 below 10  (Not Progressing)     Start:  03/07/22    Expected End:  04/05/23         STG: Karen [name] WILL COOPERATE WITH A PSYCHIATRIC EVALUATION ON 10/05/2020  AND DURING ALL FOLLOW-UP VISITS (Completed/Met)     Start:  03/07/22    Expected End:  10/04/22    Resolved:  03/26/22      STG: Karen [name] WILL IDENTIFY COGNITIVE PATTERNS AND BELIEFS THAT INTERFERE WITH THERAPY (Progressing)     Start:  03/07/22    Expected End:   04/05/23          decrease GAD-7 below 5  (Not Progressing)     Start:  03/07/22    Expected End:  04/05/23                   ProgressTowards Goals: Progressing  Interventions: CBT, Motivational Interviewing, Supportive, and Reframing   Suicidal/Homicidal: Nowithout intent/plan  Therapist Response:    Pt was alert and oriented x 5. She was dressed casually and engaged well in therapy session. She presented with depressed and anxious mood/affect. Karen [name] was pleasant, cooperative and maintained good eye contact.   Patient reports primary stressors as her father dying from cancer.  Patient reports that she has come to terms with his eventual death.  Patient reports that there is a lot of regret in the relationship.  Patient reports that father has set things and she feels he cannot take back.  Karen [name] reports that this has been difficult because he has brain cancer and it has been hard for her to differentiate between what he feels is actually real and the things he says because of his brain tumor.  Patient reports having prior to his cancer diagnosis the relationship is strained.  This was due to just occur and her addiction.  Patient reports that she was also doing alcohol  activities with her father which did not help things.  Patient reports that she wants to spend time with him as needed she does not want to have recurrence after he passes away.  Intervention/Plan: [name] psychoanalytic therapy for patient to express thoughts, feelings and emotions.  [name] used supportive therapy for praise and encouragement.  [name] used motivational interviewing for open-ended questions, positive affirmations, and reflective listening.  [name] CBT therapy for reframing.  [name] educated patient on the stages of grief and loss.  [name] educated patient on defense mechanisms for displacement and projection.     Plan: Return again in 3 weeks.  Diagnosis: Bipolar 1 disorder (HCC)  Collaboration of Care: Other  None today   Patient/Guardian was advised Release of Information must be obtained prior to any record release in order to collaborate their care with an outside provider. Patient/Guardian was advised if they have not already done so to contact the registration department to sign all necessary forms in order for [place] to release information regarding their care.   Consent: Patient/Guardian gives verbal consent for treatment and assignment of benefits for services provided during this visit. Patient/Guardian expressed understanding and agreed to proceed.   Karen Cooks, [name] 03/26/2023

## 2023-04-19 ENCOUNTER — Other Ambulatory Visit: Payer: Self-pay

## 2023-04-23 ENCOUNTER — Ambulatory Visit (INDEPENDENT_AMBULATORY_CARE_PROVIDER_SITE_OTHER): Payer: No Payment, Other | Admitting: Licensed Clinical Social Worker

## 2023-04-23 DIAGNOSIS — F319 Bipolar disorder, unspecified: Secondary | ICD-10-CM

## 2023-04-23 NOTE — Progress Notes (Signed)
THERAPIST PROGRESS NOTE  Virtual Visit via Video Note  I connected with Karen [name] on 04/23/23 at  2:00 PM EDT by a video enabled telemedicine application and verified that I am speaking with the correct person using two identifiers.  Location: Patient: Karen [name]  Provider: Provides  Home    I discussed the limitations of evaluation and management by telemedicine and the availability of in person appointments. The patient expressed understanding and agreed to proceed.    I discussed the assessment and treatment plan with the patient. The patient was provided an opportunity to ask questions and all were answered. The patient agreed with the plan and demonstrated an understanding of the instructions.   The patient was advised to call back or seek an in-person evaluation if the symptoms worsen or if the condition fails to improve as anticipated.  I provided 30 minutes of non-face-to-face time during this encounter.   Karen Cooks, [name]   Participation Level: Active  Behavioral Response: CasualAlertAnxious and Depressed  Type of Therapy: Individual Therapy  Treatment Goals addressed:     Active     Bipolar Disorder CCP Problem  1 bipolar 1 disorder       walk 3 x weekly or workout  (Progressing)     Start:  03/07/22    Expected End:  09/06/23         maintain 40 hour a week employment  (Completed/Met)     Start:  03/07/22    Expected End:  10/04/22    Resolved:  08/08/22      LTG: Stabilize mood and increase goal-directed behavior: decrease PHQ-9 below 10  (Not Progressing)     Start:  03/07/22    Expected End:  09/06/23         STG: Karen [name] WILL COOPERATE WITH A PSYCHIATRIC EVALUATION ON 10/05/2020  AND DURING ALL FOLLOW-UP VISITS (Completed/Met)     Start:  03/07/22    Expected End:  10/04/22    Resolved:  03/26/22      STG: Karen [name] WILL IDENTIFY COGNITIVE PATTERNS AND BELIEFS THAT INTERFERE WITH THERAPY (Progressing)     Start:  03/07/22     Expected End:  09/06/23       Goal Note     Catastrophic language           decrease GAD-7 below 5  (Not Progressing)     Start:  03/07/22    Expected End:  09/06/23              ProgressTowards Goals: Progressing  Interventions: CBT, Motivational Interviewing, and Supportive   Suicidal/Homicidal: Nowithout intent/plan  Therapist Response:      Karen [name] was alert and oriented x 5. She was dressed casually and engaged well in therapy session. She presented with depressed an anxious mood/affect. She was pleasant, cooperative and maintained good eye contact.   Karen [name] reports that her father passed away after a multiple year battle with brain cancer. Karen [name] reports it was terminal and they knew he would die from it, but he fought it for so long it did not feel real when it happened. Pt reports that she has always had a complex relationship with her father. 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 Pt reports the hardest part about planning the funeral is the fact that her sister does not want to get along with pt. Pt reports that her sister had a complex relationship with their father  as well. Karen [name] states that it has been difficult to plan anything as her sister has been fighting pt and father wife every step of the way.   Intervention/Plan: [name] used psychoanalytic therapy for free association for pt to express thoughts, feelings and emotions. [name] educate pt the stages of grief/loss. [name] educated pt on partialization and breaking things down to decrease anxiety. [name] spoke with pt about closure exercises such as a "closure letter" to her father.   Plan: Return again in 3 weeks.  Diagnosis: Bipolar 1 disorder (HCC)  Collaboration of Care: Other None today   Patient/Guardian was advised Release of Information must be obtained prior to any record release in order to collaborate their care with an outside provider. Patient/Guardian was  advised if they have not already done so to contact the registration department to sign all necessary forms in order for [place] to release information regarding their care.   Consent: Patient/Guardian gives verbal consent for treatment and assignment of benefits for services provided during this visit. Patient/Guardian expressed understanding and agreed to proceed.   Karen Cooks, [name] 04/23/2023

## 2023-04-29 ENCOUNTER — Ambulatory Visit (INDEPENDENT_AMBULATORY_CARE_PROVIDER_SITE_OTHER): Payer: No Payment, Other | Admitting: Licensed Clinical Social Worker

## 2023-04-29 DIAGNOSIS — F319 Bipolar disorder, unspecified: Secondary | ICD-10-CM | POA: Diagnosis not present

## 2023-04-29 NOTE — Progress Notes (Signed)
THERAPIST PROGRESS NOTE  Virtual Visit via Video Note  I connected with Karen [name] on 04/29/23 at 10:00 AM EDT by a video enabled telemedicine application and verified that I am speaking with the correct person using two identifiers.  Location: Patient: [facility]  Provider: [facility]    I discussed the limitations of evaluation and management by telemedicine and the availability of in person appointments. The patient expressed understanding and agreed to proceed.      I discussed the assessment and treatment plan with the patient. The patient was provided an opportunity to ask questions and all were answered. The patient agreed with the plan and demonstrated an understanding of the instructions.   The patient was advised to call back or seek an in-person evaluation if the symptoms worsen or if the condition fails to improve as anticipated.  I provided 60  minutes of non-face-to-face time during this encounter.   Karen Cooks, [name]   Participation Level: Active  Behavioral Response: CasualAlertAnxious and Depressed  Type of Therapy: Individual Therapy  Treatment Goals addressed:  Active     Bipolar Disorder CCP Problem  1 bipolar 1 disorder       walk 3 x weekly or workout  (Progressing)     Start:  03/07/22    Expected End:  09/06/23         maintain 40 hour a week employment  (Completed/Met)     Start:  03/07/22    Expected End:  10/04/22    Resolved:  08/08/22      LTG: Stabilize mood and increase goal-directed behavior: decrease PHQ-9 below 10  (Not Progressing)     Start:  03/07/22    Expected End:  09/06/23         STG: Karen [name] WILL COOPERATE WITH A PSYCHIATRIC EVALUATION ON 10/05/2020  AND DURING ALL FOLLOW-UP VISITS (Completed/Met)     Start:  03/07/22    Expected End:  10/04/22    Resolved:  03/26/22      STG: Karen [name] WILL IDENTIFY COGNITIVE PATTERNS AND BELIEFS THAT INTERFERE WITH THERAPY (Progressing)     Start:   03/07/22    Expected End:  09/06/23          decrease GAD-7 below 5  (Not Progressing)     Start:  03/07/22    Expected End:  09/06/23               ProgressTowards Goals: Progressing  Interventions: CBT, Motivational Interviewing, and Supportive    Suicidal/Homicidal: Nowithout intent/plan  Therapist Response:   Karen [name] was alert and oriented x 5.  Patient was pleasant, cooperative, maintained good eye contact.  She engaged well in therapy session was dressed casually.  She presented today with depressed and anxious mood\affect.  Patient reports primary stressors as grief\loss, family conflict, work, and relationship.  Patient reports for the past 3 weeks she has been dealing with the grief and loss of her father who weakly passed away from terminal brain cancer.  Patient reports that this is added tension and sadness into her day-to-day life planning his "celebration of life".  Patient reports stressors as her sister who was very emotional throughout this process and was causing conflict.   Other stressors for patient or work as she was excused for 3 days for bereavement but took off more time due to not being ready to return to work because of her "emotional batteries".  Patient states that she has been talking with her  boyfriend Karen [name] about possibly moving in with him.  They have been discussing the logistics of the move and who would move than where.  Intervention/Plan: [name] used CBT therapy to discuss the process of handling problems versus the problems themselves.  [name] used motivational interviewing for positive affirmations, reflective listening and open-ended questions.  [name] psychoanalytic therapy for patient to express thoughts, feelings and concerns using free association.  [name] utilized reframing in session   Plan: Return again in 2 weeks.  Diagnosis: Bipolar 1 disorder (HCC)  Collaboration of Care: Other None today   Patient/Guardian was advised Release of Information  must be obtained prior to any record release in order to collaborate their care with an outside provider. Patient/Guardian was advised if they have not already done so to contact the registration department to sign all necessary forms in order for [place] to release information regarding their care.   Consent: Patient/Guardian gives verbal consent for treatment and assignment of benefits for services provided during this visit. Patient/Guardian expressed understanding and agreed to proceed.   Karen Cooks, [name] 04/29/2023

## 2023-05-08 ENCOUNTER — Ambulatory Visit (INDEPENDENT_AMBULATORY_CARE_PROVIDER_SITE_OTHER): Payer: No Payment, Other | Admitting: Licensed Clinical Social Worker

## 2023-05-08 DIAGNOSIS — F319 Bipolar disorder, unspecified: Secondary | ICD-10-CM | POA: Diagnosis not present

## 2023-05-08 NOTE — Progress Notes (Signed)
THERAPIST PROGRESS NOTE  Virtual Visit via Video Note  I connected with Wren Deberry on 05/08/23 at  9:00 AM EDT by a video enabled telemedicine application and verified that I am speaking with the correct person using two identifiers.  Location: Patient: [facility]  Provider: [facility]    I discussed the limitations of evaluation and management by telemedicine and the availability of in person appointments. The patient expressed understanding and agreed to proceed.     I discussed the assessment and treatment plan with the patient. The patient was provided an opportunity to ask questions and all were answered. The patient agreed with the plan and demonstrated an understanding of the instructions.   The patient was advised to call back or seek an in-person evaluation if the symptoms worsen or if the condition fails to improve as anticipated.  I provided 55  minutes of non-face-to-face time during this encounter.   [name], [name]   Participation Level: Active  Behavioral Response: CasualAlertDepressed  Type of Therapy: Individual Therapy  Treatment Goals addressed:  Active     Bipolar Disorder CCP Problem  1 bipolar 1 disorder       walk 3 x weekly or workout  (Progressing)     Start:  03/07/22    Expected End:  09/06/23         maintain 40 hour a week employment  (Completed/Met)     Start:  03/07/22    Expected End:  10/04/22    Resolved:  08/08/22      LTG: Stabilize mood and increase goal-directed behavior: decrease PHQ-9 below 10  (Not Progressing)     Start:  03/07/22    Expected End:  09/06/23         STG: [name] WILL COOPERATE WITH A PSYCHIATRIC EVALUATION ON 10/05/2020  AND DURING ALL FOLLOW-UP VISITS (Completed/Met)     Start:  03/07/22    Expected End:  10/04/22    Resolved:  03/26/22      STG: [name] WILL IDENTIFY COGNITIVE PATTERNS AND BELIEFS THAT INTERFERE WITH THERAPY (Progressing)     Start:  03/07/22    Expected  End:  09/06/23          decrease GAD-7 below 5  (Not Progressing)     Start:  03/07/22    Expected End:  09/06/23               ProgressTowards Goals: Progressing  Interventions: CBT, Motivational Interviewing, and Supportive  Summary: Efstathia Yaroch is a 32 y.o. female who presents with depressed and anxious mood\affect.  Patient was pleasant, cooperative, maintained good eye contact.  Shanequia was alert and oriented x 5.  Patient comes in today with primary stressors as "my future".  Patient reports that she is struggling to decrease anxiety and her work environment.  Deepa has admitted today that her job overall has increased her anxiety and she does not enjoy it anymore.  Patient reports that her father has left her enough money to be able to go back to school without having to work.  Bedie reports that she is looking at becoming an Dentist or a Recruitment consultant.  Patient states that she has enrolled in for [employer] community college.  She states  that she will be attending classes starting in the fall.  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  Suicidal/Homicidal: Nowithout intent/plan  Therapist Response:     Interventions/Plan: [name] psychoanalytic therapy for patient to express thoughts, feelings and concerns.  [name] supportive therapy for praise and encouragement.  [name] used person centered therapy for empowerment and unconditional positive regard utilizing nonjudgmental stances.  [name] reviewed cognitive distortions such as for her generalizations and disqualifying the positive.   Plan: Return again in 1 weeks.  Diagnosis: Bipolar 1 disorder (HCC)  Collaboration of Care: Other None today   Patient/Guardian was advised Release of Information must be obtained prior to any record release in order to collaborate their care with an outside provider. Patient/Guardian  was advised if they have not already done so to contact the registration department to sign all necessary forms in order for [place] to release information regarding their care.   Consent: Patient/Guardian gives verbal consent for treatment and assignment of benefits for services provided during this visit. Patient/Guardian expressed understanding and agreed to proceed.   [name], [name] 05/08/2023

## 2023-05-13 ENCOUNTER — Ambulatory Visit (INDEPENDENT_AMBULATORY_CARE_PROVIDER_SITE_OTHER): Payer: No Payment, Other | Admitting: Licensed Clinical Social Worker

## 2023-05-13 ENCOUNTER — Ambulatory Visit (HOSPITAL_COMMUNITY): Payer: No Payment, Other | Admitting: Licensed Clinical Social Worker

## 2023-05-13 DIAGNOSIS — F319 Bipolar disorder, unspecified: Secondary | ICD-10-CM | POA: Diagnosis not present

## 2023-05-13 NOTE — Progress Notes (Signed)
THERAPIST PROGRESS NOTE Virtual Visit via Video Note  I connected with Zahrya Kaaihue on 05/13/23 at  3:00 PM EDT by a video enabled telemedicine application and verified that I am speaking with the correct person using two identifiers.  Location: Patient: [facility]  Provider: [facility]    I discussed the limitations of evaluation and management by telemedicine and the availability of in person appointments. The patient expressed understanding and agreed to proceed.      I discussed the assessment and treatment plan with the patient. The patient was provided an opportunity to ask questions and all were answered. The patient agreed with the plan and demonstrated an understanding of the instructions.   The patient was advised to call back or seek an in-person evaluation if the symptoms worsen or if the condition fails to improve as anticipated.  I provided 55 minutes of non-face-to-face time during this encounter.   [name], [name]   Participation Level: Active  Behavioral Response: CasualAlertAnxious and Depressed  Type of Therapy: Individual Therapy  Treatment Goals addressed:  Active     Bipolar Disorder CCP Problem  1 bipolar 1 disorder       walk 3 x weekly or workout  (Progressing)     Start:  03/07/22    Expected End:  09/06/23         maintain 40 hour a week employment  (Completed/Met)     Start:  03/07/22    Expected End:  10/04/22    Resolved:  08/08/22      LTG: Stabilize mood and increase goal-directed behavior: decrease PHQ-9 below 10  (Not Progressing)     Start:  03/07/22    Expected End:  09/06/23         STG: [name] WILL COOPERATE WITH A PSYCHIATRIC EVALUATION ON 10/05/2020  AND DURING ALL FOLLOW-UP VISITS (Completed/Met)     Start:  03/07/22    Expected End:  10/04/22    Resolved:  03/26/22      STG: [name] WILL IDENTIFY COGNITIVE PATTERNS AND BELIEFS THAT INTERFERE WITH THERAPY (Progressing)     Start:  03/07/22     Expected End:  09/06/23          decrease GAD-7 below 5  (Not Progressing)     Start:  03/07/22    Expected End:  09/06/23               ProgressTowards Goals: Progressing  Interventions: CBT and Motivational Interviewing    Suicidal/Homicidal: Nowithout intent/plan  Therapist Response:     Ilah was alert and oriented x 5. She was dressed casually and engaged well in therapy session. She presented with depressed and anxious mood/affect. Pt was pleasant and cooperative.   Primary stressor is friendship with neighbor. Arianny reports her friend has crossed some boundaries that she is not comfortable with. Talajah reports example of her friend throwing something at her neighbor and coming to [place] house to hide out. Pt also reports this friend stopped talking to her because other people told this friend that Beren did not like her. Yaireth reports she is struggling to set healthy boundaries with her friend.   Interventions/Plan:  [name] used supportive therapy for praise and encouragement. [name] used motivational interviewing for reflective listening and positive affirmations. [name] educated pt on how setting healthy boundaries can decrease anxiety. [name] educated pt about knowing her values.   Plan: Return again in 3 weeks.  Diagnosis: Bipolar 1 disorder (HCC)  Collaboration  of Care: Other None today   Patient/Guardian was advised Release of Information must be obtained prior to any record release in order to collaborate their care with an outside provider. Patient/Guardian was advised if they have not already done so to contact the registration department to sign all necessary forms in order for [place] to release information regarding their care.   Consent: Patient/Guardian gives verbal consent for treatment and assignment of benefits for services provided during this visit. Patient/Guardian expressed understanding and agreed to proceed.   [name], [name] 05/13/2023

## 2023-05-27 ENCOUNTER — Other Ambulatory Visit (HOSPITAL_COMMUNITY): Payer: Self-pay | Admitting: Physician Assistant

## 2023-05-27 ENCOUNTER — Telehealth (HOSPITAL_COMMUNITY): Payer: Self-pay | Admitting: Licensed Clinical Social Worker

## 2023-05-27 ENCOUNTER — Other Ambulatory Visit: Payer: Self-pay

## 2023-05-27 ENCOUNTER — Ambulatory Visit (INDEPENDENT_AMBULATORY_CARE_PROVIDER_SITE_OTHER): Payer: No Payment, Other | Admitting: Licensed Clinical Social Worker

## 2023-05-27 DIAGNOSIS — F319 Bipolar disorder, unspecified: Secondary | ICD-10-CM | POA: Diagnosis not present

## 2023-05-27 NOTE — Telephone Encounter (Signed)
Pt requesting a call back due to increase manic symptoms and wanting  to talk about medication options.

## 2023-05-27 NOTE — Progress Notes (Signed)
THERAPIST PROGRESS NOTE  Virtual Visit via Video Note  I connected with Lujain Kraszewski on 05/27/23 at  2:00 PM EDT by a video enabled telemedicine application and verified that I am speaking with the correct person using two identifiers.  Location: Patient: [facility]  Provider: [facility]  I discussed the limitations of evaluation and management by telemedicine and the availability of in person appointments. The patient expressed understanding and agreed to proceed.    I discussed the assessment and treatment plan with the patient. The patient was provided an opportunity to ask questions and all were answered. The patient agreed with the plan and demonstrated an understanding of the instructions.   The patient was advised to call back or seek an in-person evaluation if the symptoms worsen or if the condition fails to improve as anticipated.  I provided 45 minutes of non-face-to-face time during this encounter.   [name], [name]   Participation Level: Active  Behavioral Response: CasualAlertAnxious  Type of Therapy: Individual Therapy  Treatment Goals addressed:  Active     Bipolar Disorder CCP Problem  1 bipolar 1 disorder       walk 3 x weekly or workout  (Progressing)     Start:  03/07/22    Expected End:  09/06/23         maintain 40 hour a week employment  (Completed/Met)     Start:  03/07/22    Expected End:  10/04/22    Resolved:  08/08/22      LTG: Stabilize mood and increase goal-directed behavior: decrease PHQ-9 below 10  (Not Progressing)     Start:  03/07/22    Expected End:  09/06/23         STG: [name] WILL COOPERATE WITH A PSYCHIATRIC EVALUATION ON 10/05/2020  AND DURING ALL FOLLOW-UP VISITS (Completed/Met)     Start:  03/07/22    Expected End:  10/04/22    Resolved:  03/26/22      STG: [name] WILL IDENTIFY COGNITIVE PATTERNS AND BELIEFS THAT INTERFERE WITH THERAPY (Progressing)     Start:  03/07/22    Expected End:   09/06/23          decrease GAD-7 below 5  (Not Progressing)     Start:  03/07/22    Expected End:  09/06/23            WORK WITH [name] TO DISCUSS RISKS AND BENEFITS OF MEDICATION TREATMENT OPTIONS FOR THIS PROBLEM AND PRESCRIBE AS INDICATED (Completed)     Start:  03/07/22    End:  03/14/22      Conduct a review of all medications to evaluate any drug/drug interactions (Completed)     Start:  03/07/22    End:  03/14/22      REVIEW WITH [name] THEIR RESPONSE TO THE PRESCRIBED MEDICATION, INCLUDING ANY SIDE EFFECTS (Completed)     Start:  03/07/22    End:  03/14/22      ENCOURAGE Preslea TO KEEP A LOG OF MEDICATION SIDE EFFECTS, QUESTIONS REGARDING MEDICATION, AND SYMPTOMS (Completed)     Start:  03/07/22    End:  03/26/22      INSTRUCT Anetta TO TAKE PSYCHOTROPIC MEDICATION AS PRESCRIBED (Completed)     Start:  03/07/22    End:  03/14/22      INSTRUCT Elinor TO COMMUNICATE EFFECTS OF PRESCRIBED MEDICATIONS (Completed)     Start:  03/07/22    End:  03/26/22      EDUCATE  Keonda ON BIPOLAR DISORDER DIAGNOSTIC CRITERIA (Completed)     Start:  03/07/22    End:  03/14/22      WORK WITH [name] TO DEVELOP A LIST OF AT LEAST 1 CONSEQUENCES OF UNTREATED BIPOLAR SYMPTOMS (Completed)     Start:  03/07/22    End:  03/14/22         ProgressTowards Goals: Progressing  Interventions: CBT and Motivational Interviewing  Summary: Bellanie Matthew is a 32 y.o. female who presents with   Suicidal/Homicidal: Nowithout intent/plan  Therapist Response:    Pt was alert and oriented x 5. She was dressed casually and engaged well in therapy session. She presented with depressed and anxious mood/affect. Dura was pleasant, cooperative, and maintained good eye contact.   Pt reports primary stressor is increase in manic symptoms for restlessness, excessive spending and euphoria increase. [name] educated pt to on advocating for herself by talking to provider for increase in  medications for bipolar disorder or changing medications. Pharrah reports that she has not done this is the past because she does not want to admit they are wrong. [name] spoke about the consequences of doing nothing such as hospitalization, loss of relationships, and financial strain.    Other stressor for pt is housing do to the fact she wants to move in with her boyfriend next spring. Pt reports increased tension and worry due to the fact she feels she has a lot on her plate with her child, starting school, work, and progressing her relationship.  Intervention/Plan: [name] used psychoanalytic therapy for pt express thoughts, feeling and concerns using free association. [name] used supportive praise and encouragement. [name] educated pt on not setting deadlines on things that do not need them. This can cause unwanted and unnecessary anxiety.    Plan: Return again in 3 weeks.  Diagnosis: Bipolar 1 disorder (HCC)  Collaboration of Care: Other None today   Patient/Guardian was advised Release of Information must be obtained prior to any record release in order to collaborate their care with an outside provider. Patient/Guardian was advised if they have not already done so to contact the registration department to sign all necessary forms in order for [place] to release information regarding their care.   Consent: Patient/Guardian gives verbal consent for treatment and assignment of benefits for services provided during this visit. Patient/Guardian expressed understanding and agreed to proceed.   [name], [name] 05/27/2023

## 2023-06-18 ENCOUNTER — Other Ambulatory Visit (HOSPITAL_COMMUNITY): Payer: Self-pay | Admitting: Physician Assistant

## 2023-06-18 DIAGNOSIS — F319 Bipolar disorder, unspecified: Secondary | ICD-10-CM

## 2023-06-20 ENCOUNTER — Ambulatory Visit (INDEPENDENT_AMBULATORY_CARE_PROVIDER_SITE_OTHER): Payer: No Payment, Other | Admitting: Licensed Clinical Social Worker

## 2023-06-20 DIAGNOSIS — F319 Bipolar disorder, unspecified: Secondary | ICD-10-CM

## 2023-06-20 NOTE — Progress Notes (Signed)
THERAPIST PROGRESS NOTE  Session Time: 24  Participation Level: Active  Behavioral Response: CasualAlertAnxious and Depressed  Type of Therapy: Individual Therapy  Treatment Goals addressed:  Active     Bipolar Disorder CCP Problem  1 bipolar 1 disorder       walk 3 x weekly or workout  (Progressing)     Start:  03/07/22    Expected End:  09/06/23         maintain 40 hour a week employment  (Completed/Met)     Start:  03/07/22    Expected End:  10/04/22    Resolved:  08/08/22      LTG: Stabilize mood and increase goal-directed behavior: decrease PHQ-9 below 10  (Not Progressing)     Start:  03/07/22    Expected End:  09/06/23         STG: Karen [name] WILL COOPERATE WITH A PSYCHIATRIC EVALUATION ON 10/05/2020  AND DURING ALL FOLLOW-UP VISITS (Completed/Met)     Start:  03/07/22    Expected End:  10/04/22    Resolved:  03/26/22      STG: Karen [name] WILL IDENTIFY COGNITIVE PATTERNS AND BELIEFS THAT INTERFERE WITH THERAPY (Progressing)     Start:  03/07/22    Expected End:  09/06/23          decrease GAD-7 below 5  (Not Progressing)     Start:  03/07/22    Expected End:  09/06/23            WORK WITH Karen [name] TO DISCUSS RISKS AND BENEFITS OF MEDICATION TREATMENT OPTIONS FOR THIS PROBLEM AND PRESCRIBE AS INDICATED (Completed)     Start:  03/07/22    End:  03/14/22      Conduct a review of all medications to evaluate any drug/drug interactions (Completed)     Start:  03/07/22    End:  03/14/22      REVIEW WITH Karen [name] THEIR RESPONSE TO THE PRESCRIBED MEDICATION, INCLUDING ANY SIDE EFFECTS (Completed)     Start:  03/07/22    End:  03/14/22      ENCOURAGE Karen [name] TO KEEP A LOG OF MEDICATION SIDE EFFECTS, QUESTIONS REGARDING MEDICATION, AND SYMPTOMS (Completed)     Start:  03/07/22    End:  03/26/22      INSTRUCT Karen [name] TO TAKE PSYCHOTROPIC MEDICATION AS PRESCRIBED (Completed)     Start:  03/07/22    End:  03/14/22      INSTRUCT Karen [name] TO COMMUNICATE EFFECTS  OF PRESCRIBED MEDICATIONS (Completed)     Start:  03/07/22    End:  03/26/22      EDUCATE Karen [name] ON BIPOLAR DISORDER DIAGNOSTIC CRITERIA (Completed)     Start:  03/07/22    End:  03/14/22      WORK WITH Karen [name] TO DEVELOP A LIST OF AT LEAST 1 CONSEQUENCES OF UNTREATED BIPOLAR SYMPTOMS (Completed)     Start:  03/07/22    End:  03/14/22         ProgressTowards Goals: Progressing  Interventions: CBT and Motivational Interviewing  Suicidal/Homicidal: Nowithout intent/plan  Therapist Response:      Pt was alert and oriented x 5. Karen [name] was dressed casually and engaged well in therapy session. She presented with anxious mood/affect. She was pleasant and cooperative.  Karen [name] reports symptoms for mania have declined she reports that she stopped taking vitamin that has been known to increase mania symptoms. She reports primary stressors as parents and ex relationship. Karen [name] states that Karen [name] behavior has got worse with  now biting, hitting and having temper tantrums. Pt reports that she has got him into a psych appointment in [facility] at [facility] for sept 10th. [name] is fearful that some of Karen [name]'s behavior may stem from him going over to his dad's house. She reports example that Karen [name] was being very bad after she took away his game and she called his dad. Father stated "I just do not understand why you can't give it to him for 15 minutes" when Karen [name] refused to give the game back as Karen [name] was still acting up father stated to Karen [name] that he could not help her and hung up the phone.  Interventions/Plan: [name] spoke with pt about behavioral modification such as negative reinforcement, punishment, and positive reinforcement. [name] used supportive therapy for praise and encouragement. [name] educated pt that all medications even over the counter should be discussed with the doctor incase there are any known side effects.     Plan: Return again in 2 weeks.  Diagnosis: Bipolar 1  disorder (HCC)  Collaboration of Care: Other None today   Patient/Guardian was advised Release of Information must be obtained prior to any record release in order to collaborate their care with an outside provider. Patient/Guardian was advised if they have not already done so to contact the registration department to sign all necessary forms in order for [place] to release information regarding their care.   Consent: Patient/Guardian gives verbal consent for treatment and assignment of benefits for services provided during this visit. Patient/Guardian expressed understanding and agreed to proceed.   Karen Cooks, [name] 06/20/2023

## 2023-06-25 ENCOUNTER — Telehealth (HOSPITAL_COMMUNITY): Payer: No Payment, Other | Admitting: Physician Assistant

## 2023-07-02 ENCOUNTER — Other Ambulatory Visit (HOSPITAL_COMMUNITY): Payer: Self-pay

## 2023-07-02 ENCOUNTER — Other Ambulatory Visit (HOSPITAL_COMMUNITY): Payer: Self-pay | Admitting: Physician Assistant

## 2023-07-02 DIAGNOSIS — F319 Bipolar disorder, unspecified: Secondary | ICD-10-CM

## 2023-07-02 MED ORDER — LAMOTRIGINE 100 MG PO TABS
100.0000 mg | ORAL_TABLET | Freq: Two times a day (BID) | ORAL | 1 refills | Status: DC
Start: 2023-07-02 — End: 2023-07-10
  Filled 2023-07-02: qty 60, 30d supply, fill #0

## 2023-07-02 MED ORDER — ARIPIPRAZOLE 20 MG PO TABS
20.0000 mg | ORAL_TABLET | Freq: Every day | ORAL | 1 refills | Status: DC
Start: 2023-07-02 — End: 2023-07-10
  Filled 2023-07-02: qty 30, 30d supply, fill #0

## 2023-07-02 NOTE — Telephone Encounter (Signed)
Message acknowledged and reviewed.

## 2023-07-03 ENCOUNTER — Other Ambulatory Visit (HOSPITAL_COMMUNITY): Payer: Self-pay

## 2023-07-04 ENCOUNTER — Ambulatory Visit (INDEPENDENT_AMBULATORY_CARE_PROVIDER_SITE_OTHER): Payer: No Payment, Other | Admitting: Licensed Clinical Social Worker

## 2023-07-04 DIAGNOSIS — F319 Bipolar disorder, unspecified: Secondary | ICD-10-CM

## 2023-07-04 NOTE — Progress Notes (Signed)
THERAPIST PROGRESS NOTE  Virtual Visit via Video Note  I connected with Tkai Tiley on 07/04/23 at 11:00 AM EDT by a video enabled telemedicine application and verified that I am speaking with the correct person using two identifiers.  Location: Patient: [facility]  Provider: [facility]    I discussed the limitations of evaluation and management by telemedicine and the availability of in person appointments. The patient expressed understanding and agreed to proceed.      I discussed the assessment and treatment plan with the patient. The patient was provided an opportunity to ask questions and all were answered. The patient agreed with the plan and demonstrated an understanding of the instructions.   The patient was advised to call back or seek an in-person evaluation if the symptoms worsen or if the condition fails to improve as anticipated.  I provided 45 minutes of non-face-to-face time during this encounter.   [name], [name]   Participation Level: Active  Behavioral Response: CasualAlertAnxious and Depressed  Type of Therapy: Individual Therapy  Treatment Goals addressed:  Active     Bipolar Disorder CCP Problem  1 bipolar 1 disorder       walk 3 x weekly or workout  (Progressing)     Start:  03/07/22    Expected End:  09/06/23         maintain 40 hour a week employment  (Completed/Met)     Start:  03/07/22    Expected End:  10/04/22    Resolved:  08/08/22      LTG: Stabilize mood and increase goal-directed behavior: decrease PHQ-9 below 10  (Not Progressing)     Start:  03/07/22    Expected End:  09/06/23         STG: [name] WILL COOPERATE WITH A PSYCHIATRIC EVALUATION ON 10/05/2020  AND DURING ALL FOLLOW-UP VISITS (Completed/Met)     Start:  03/07/22    Expected End:  10/04/22    Resolved:  03/26/22      STG: [name] WILL IDENTIFY COGNITIVE PATTERNS AND BELIEFS THAT INTERFERE WITH THERAPY (Progressing)     Start:  03/07/22     Expected End:  09/06/23          decrease GAD-7 below 5  (Not Progressing)     Start:  03/07/22    Expected End:  09/06/23            WORK WITH [name] TO DISCUSS RISKS AND BENEFITS OF MEDICATION TREATMENT OPTIONS FOR THIS PROBLEM AND PRESCRIBE AS INDICATED (Completed)     Start:  03/07/22    End:  03/14/22      Conduct a review of all medications to evaluate any drug/drug interactions (Completed)     Start:  03/07/22    End:  03/14/22      REVIEW WITH [name] THEIR RESPONSE TO THE PRESCRIBED MEDICATION, INCLUDING ANY SIDE EFFECTS (Completed)     Start:  03/07/22    End:  03/14/22      ENCOURAGE Babbie TO KEEP A LOG OF MEDICATION SIDE EFFECTS, QUESTIONS REGARDING MEDICATION, AND SYMPTOMS (Completed)     Start:  03/07/22    End:  03/26/22      INSTRUCT Marlaina TO TAKE PSYCHOTROPIC MEDICATION AS PRESCRIBED (Completed)     Start:  03/07/22    End:  03/14/22      INSTRUCT Naijah TO COMMUNICATE EFFECTS OF PRESCRIBED MEDICATIONS (Completed)     Start:  03/07/22    End:  03/26/22  EDUCATE Jemina ON BIPOLAR DISORDER DIAGNOSTIC CRITERIA (Completed)     Start:  03/07/22    End:  03/14/22      WORK WITH [name] TO DEVELOP A LIST OF AT LEAST 1 CONSEQUENCES OF UNTREATED BIPOLAR SYMPTOMS (Completed)     Start:  03/07/22    End:  03/14/22         ProgressTowards Goals: Progressing  Interventions: CBT and Supportive   Suicidal/Homicidal: Nowithout intent/plan  Therapist Response:   Pt was alert and oriented x 5. She was dressed casually and engaged well in therapy session. She presented with anxious mood/affect. She was pleasant, cooperative and maintained good eye contact.   Primary stressor for pt is school both for herself and child. Jameice states she has started 1 class in college and her son has started kindergarten. Pt reports tension, worry and restlessness. 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 Pt reports that  school for her son has gone well now that he is in a school with adequate staffing and resources for behavioral problems. Pt reports that her son has come home with more discipline and structure since starting school. Jema reports he has even walked to his class on his own.  Interventions/Plan: [name] educated pt on behavioral modifications for punishment, negative reinforcement, and positive reinforcement. [name] educated pt on symptoms for tension and worry for anxiety. [name] spoke with pt about triggers for anxiety such as school. [name] spoke with pt on coping mechanism for organization to help decrease stress and tension.      Plan: Return again in 3 weeks.  Diagnosis: Bipolar 1 disorder (HCC)  Collaboration of Care: Other None today   Patient/Guardian was advised Release of Information must be obtained prior to any record release in order to collaborate their care with an outside provider. Patient/Guardian was advised if they have not already done so to contact the registration department to sign all necessary forms in order for [place] to release information regarding their care.   Consent: Patient/Guardian gives verbal consent for treatment and assignment of benefits for services provided during this visit. Patient/Guardian expressed understanding and agreed to proceed.   [name], [name] 07/04/2023

## 2023-07-10 ENCOUNTER — Encounter (HOSPITAL_COMMUNITY): Payer: Self-pay | Admitting: Physician Assistant

## 2023-07-10 ENCOUNTER — Other Ambulatory Visit: Payer: Self-pay

## 2023-07-10 ENCOUNTER — Telehealth (INDEPENDENT_AMBULATORY_CARE_PROVIDER_SITE_OTHER): Payer: No Payment, Other | Admitting: Physician Assistant

## 2023-07-10 ENCOUNTER — Other Ambulatory Visit (HOSPITAL_COMMUNITY): Payer: Self-pay

## 2023-07-10 DIAGNOSIS — F4312 Post-traumatic stress disorder, chronic: Secondary | ICD-10-CM

## 2023-07-10 DIAGNOSIS — F319 Bipolar disorder, unspecified: Secondary | ICD-10-CM

## 2023-07-10 DIAGNOSIS — F99 Mental disorder, not otherwise specified: Secondary | ICD-10-CM

## 2023-07-10 DIAGNOSIS — F419 Anxiety disorder, unspecified: Secondary | ICD-10-CM

## 2023-07-10 DIAGNOSIS — F5105 Insomnia due to other mental disorder: Secondary | ICD-10-CM | POA: Diagnosis not present

## 2023-07-10 MED ORDER — CLONAZEPAM 0.5 MG PO TABS
0.2500 mg | ORAL_TABLET | Freq: Every day | ORAL | 0 refills | Status: DC | PRN
Start: 2023-07-10 — End: 2023-08-21

## 2023-07-10 MED ORDER — ARIPIPRAZOLE 20 MG PO TABS
20.0000 mg | ORAL_TABLET | Freq: Every day | ORAL | 1 refills | Status: DC
Start: 2023-07-10 — End: 2023-08-21
  Filled 2023-07-10: qty 30, 30d supply, fill #0

## 2023-07-10 MED ORDER — GABAPENTIN 300 MG PO CAPS
600.0000 mg | ORAL_CAPSULE | Freq: Three times a day (TID) | ORAL | 1 refills | Status: DC
  Filled 2023-07-10 (×2): qty 180, 30d supply, fill #0

## 2023-07-10 MED ORDER — LAMOTRIGINE 100 MG PO TABS
100.0000 mg | ORAL_TABLET | Freq: Two times a day (BID) | ORAL | 1 refills | Status: DC
Start: 2023-07-10 — End: 2023-08-21
  Filled 2023-07-10: qty 60, 30d supply, fill #0

## 2023-07-10 MED ORDER — RAMELTEON 8 MG PO TABS
8.0000 mg | ORAL_TABLET | Freq: Every day | ORAL | 1 refills | Status: DC
  Filled 2023-07-10 (×2): qty 30, 30d supply, fill #0

## 2023-07-10 NOTE — Progress Notes (Signed)
BH MD/PA/NP OP Progress Note  Virtual Visit via Video Note  I connected with Karen [name] on 07/10/23 at  2:30 PM EDT by a video enabled telemedicine application and verified that I am speaking with the correct person using two identifiers.  Location: Patient: Home Provider: Clinic   I discussed the limitations of evaluation and management by telemedicine and the availability of in person appointments. The patient expressed understanding and agreed to proceed.  Follow Up Instructions:  I discussed the assessment and treatment plan with the patient. The patient was provided an opportunity to ask questions and all were answered. The patient agreed with the plan and demonstrated an understanding of the instructions.   The patient was advised to call back or seek an in-person evaluation if the symptoms worsen or if the condition fails to improve as anticipated.  I provided 14 minutes of non-face-to-face time during this encounter.  Meta Hatchet, PA    07/10/2023 7:30 PM Karen [name]  MRN:  [id number]  Chief Complaint:  Chief Complaint  Patient presents with   Follow-up   Medication Management   HPI:   Karen [name] is a 32 year old, Caucasian female with a past psychiatric history significant for chronic anxiety, bipolar 1 disorder, and chronic PTSD who presents to [facility] via virtual video visit for follow-up and medication management.  Patient is currently being managed on the following psychiatric medications:  Mirtazapine 7.5 mg at bedtime Gabapentin 600 mg 3 times daily Hydroxyzine 25 mg 3 times daily as needed Lamotrigine 100 mg 2 times daily Abilify 20 mg daily  Patient presents to the encounter stating that she feels mostly fine, despite being under the weather.  She reports that she has been experiencing restless arms and legs which has been affecting her ability to sleep.  Patient reports that it starts as a  tingling feeling that spreads to her body then slowly turns into restlessness.  Patient believes that her restlessness is attributed to the use of one of her medications.  She reports that she has been on Abilify and Lamictal for an extended period of time and has never experienced these symptoms.  Patient denies overt depressive symptoms but does continue to endorse heightened anxiety.  Patient rates her anxiety an 8 out of 10 and attributes her anxiety to recently starting school, being a single mother, and her father passing away in June.  A GAD-7 screen was performed with the patient scoring an 18.  Patient is alert and oriented x 4, calm, cooperative, and engaged in conversation during the encounter.  Patient describes her mood as stressed.  Patient denies suicidal or homicidal ideations.  She further denies auditory or visual hallucinations and does not appear to be responding to internal/external stimuli.  Patient endorses fair sleep and receives on average a total of 6 hours of sleep per night.  Patient states that she often wakes up every 2 hours due to her restlessness.  Patient endorses good appetite and eats on average 3 meals per day.  Patient denies alcohol consumption, tobacco use, or illicit drug use.  Visit Diagnosis:    ICD-10-CM   1. Chronic post-traumatic stress disorder (PTSD)  F43.12     2. Bipolar 1 disorder (HCC)  F31.9 ARIPiprazole (ABILIFY) 20 MG tablet    lamoTRIgine (LAMICTAL) 100 MG tablet    3. Chronic anxiety  F41.9 gabapentin (NEURONTIN) 300 MG capsule    clonazePAM (KLONOPIN) 0.5 MG tablet  4. Insomnia due to other mental disorder  F51.05 ramelteon (ROZEREM) 8 MG tablet   F99       Past Psychiatric History:  Bipolar disorder Generalized anxiety disorder Visual hallucinations Chronic PTSD  Past Medical History:  Past Medical History:  Diagnosis Date   Anxiety    Bipolar disorder (HCC)    Chronic UTI    Depression    Endometriosis    HSV (herpes  simplex virus) anogenital infection    PTSD (post-traumatic stress disorder)     Past Surgical History:  Procedure Laterality Date   CESAREAN SECTION N/A 02/17/2018   Procedure: CESAREAN SECTION;  Surgeon: Essie Hart, MD;  Location: [facility];  Service: Obstetrics;  Laterality: N/A;   COLONOSCOPY     DIAGNOSTIC LAPAROSCOPY  11/05/2012   DILATION AND EVACUATION N/A 03/11/2015   Procedure: DILATATION AND EVACUATION;  Surgeon: Hoover Browns, MD;  Location: [facility] ORS;  Service: Gynecology;  Laterality: N/A;   DRUG INDUCED ENDOSCOPY     LAPAROSCOPY     LAPAROSCOPY N/A 06/07/2021   Procedure: LAPAROSCOPY OPERATIVE with /EXCISION OF LESIONS and lysis of adhesions;  Surgeon: Hoover Browns, MD;  Location: [facility] OR;  Service: Gynecology;  Laterality: N/A;   TONSILLECTOMY  11/05/2013    Family Psychiatric History:  Mother - Major Depressive Disorder Sister - Obsessive Compulsive Disorder Aunt (maternal) - Bipolar I Disorder Uncle (maternal) - Bipolar I Disorder  Family History:  Family History  Problem Relation Age of Onset   Rheum arthritis Mother    Cancer Father     Social History:  Social History   Socioeconomic History   Marital status: Married    Spouse name: Seri Tarpinian   Number of children: 1   Years of education: Not on file   Highest education level: Some college, no degree  Occupational History   Not on file  Tobacco Use   Smoking status: Every Day    Types: E-cigarettes   Smokeless tobacco: Never   Tobacco comments:    PT VAPES DAILY, '' I vape a lot ''   Vaping Use   Vaping status: Every Day   Substances: Nicotine, Flavoring  Substance and Sexual Activity   Alcohol use: Not Currently   Drug use: Not Currently    Types: Heroin, Cocaine, Marijuana    Comment: Clean since Aug 2015   Sexual activity: Yes    Partners: Male  Other Topics Concern   Not on file  Social History Narrative   Not on file   Social Determinants of Health   Financial Resource Strain:  Medium Risk (03/07/2022)   Overall Financial Resource Strain (CARDIA)    Difficulty of Paying Living Expenses: Somewhat hard  Food Insecurity: No Food Insecurity (03/07/2022)   Hunger Vital Sign    Worried About Running Out of Food in the Last Year: Never true    Ran Out of Food in the Last Year: Never true  Transportation Needs: No Transportation Needs (03/07/2022)   PRAPARE - Administrator, Civil Service (Medical): No    Lack of Transportation (Non-Medical): No  Physical Activity: Sufficiently Active (03/07/2022)   Exercise Vital Sign    Days of Exercise per Week: 3 days    Minutes of Exercise per Session: 60 min  Stress: Stress Concern Present (03/07/2022)   [employer] of Occupational Health - Occupational Stress Questionnaire    Feeling of Stress : Rather much  Social Connections: Moderately Isolated (03/07/2022)   Social Connection and Isolation Panel [  NHANES]    Frequency of Communication with Friends and Family: More than three times a week    Frequency of Social Gatherings with Friends and Family: More than three times a week    Attends Religious Services: Never    Database administrator or Organizations: Yes    Attends Engineer, structural: More than 4 times per year    Marital Status: Separated    Allergies:  Allergies  Allergen Reactions   Ciprofloxacin Hives   Wound Dressing Adhesive Rash    Metabolic Disorder Labs: Lab Results  Component Value Date   HGBA1C 4.6 (L) 07/02/2022   MPG 85.32 07/02/2022   MPG 85.32 09/11/2020   No results found for: "PROLACTIN" Lab Results  Component Value Date   CHOL 117 07/02/2022   TRIG 94 07/02/2022   HDL 29 (L) 07/02/2022   CHOLHDL 4.0 07/02/2022   VLDL 19 07/02/2022   LDLCALC 69 07/02/2022   LDLCALC 78 05/09/2021   Lab Results  Component Value Date   TSH 0.519 07/02/2022   TSH 0.784 09/11/2020    Therapeutic Level Labs: Lab Results  Component Value Date   LITHIUM 0.27 (L) 07/06/2022   No  results found for: "VALPROATE" No results found for: "CBMZ"  Current Medications: Current Outpatient Medications  Medication Sig Dispense Refill   ramelteon (ROZEREM) 8 MG tablet Take 1 tablet (8 mg total) by mouth at bedtime. 30 tablet 1   acetaminophen (TYLENOL) 325 MG tablet Take 2 tablets (650 mg total) by mouth every 6 (six) hours as needed for mild pain or moderate pain. 30 tablet 1   ARIPiprazole (ABILIFY) 20 MG tablet Take 1 tablet (20 mg total) by mouth daily. 30 tablet 1   clonazePAM (KLONOPIN) 0.5 MG tablet Take 0.5 tablets (0.25 mg total) by mouth daily as needed for anxiety. 15 tablet 0   gabapentin (NEURONTIN) 300 MG capsule Take 2 capsules (600 mg total) by mouth 3 (three) times daily. 180 capsule 1   hydrOXYzine (ATARAX) 25 MG tablet Take 1 tablet (25 mg total) by mouth 3 (three) times daily as needed for anxiety. 75 tablet 1   lamoTRIgine (LAMICTAL) 100 MG tablet Take 1 tablet (100 mg total) by mouth 2 (two) times daily. 60 tablet 1   medroxyPROGESTERone (DEPO-PROVERA) 150 MG/ML injection Inject 150 mg into the muscle every 3 (three) months. Next dose due 07/03/22     mirtazapine (REMERON) 7.5 MG tablet Take 1 tablet (7.5 mg total) by mouth at bedtime. 30 tablet 1   Multiple Vitamins-Minerals (MULTIVITAMIN WITH MINERALS) tablet Take 1 tablet by mouth daily.     No current facility-administered medications for this visit.     Musculoskeletal: Strength & Muscle Tone: within normal limits Gait & Station: normal Patient leans: N/A  Psychiatric Specialty Exam: Review of Systems  Psychiatric/Behavioral:  Positive for sleep disturbance. Negative for decreased concentration, dysphoric mood, hallucinations, self-injury and suicidal ideas. The patient is nervous/anxious. The patient is not hyperactive.     There were no vitals taken for this visit.There is no height or weight on file to calculate BMI.  General Appearance: Casual  Eye Contact:  Good  Speech:  Clear and Coherent  and Normal Rate  Volume:  Normal  Mood:  Anxious and Euthymic  Affect:  Congruent  Thought Process:  Coherent, Goal Directed, and Descriptions of Associations: Intact  Orientation:  Full (Time, Place, and Person)  Thought Content: WDL   Suicidal Thoughts:  No  Homicidal Thoughts:  No  Memory:  Immediate;   Good Recent;   Good Remote;   Good  Judgement:  Good  Insight:  Good  Psychomotor Activity:  Normal  Concentration:  Concentration: Good and Attention Span: Good  Recall:  Good  Fund of Knowledge: Good  Language: Good  Akathisia:  No  Handed:  Ambidextrous  AIMS (if indicated): not done  Assets:  Communication Skills Desire for Improvement Housing Social Support Vocational/Educational  ADL's:  Intact  Cognition: WNL  Sleep:  Fair   Screenings: AIMS    Flowsheet Row Ad[facility] (Discharged) [facility] Ad[facility] (Discharged[facility] from [facility] Admission (Discharged) from [facility]  AIMS Total Score 0 0 0      AUDIT    Flowsheet Row Admission (Discharged) [facility] Admission (Discharged) from [facility] Visit [facility] Admission (Discharged) from [facility]  Alcohol Use Disorder Identification Test Final Score (AUDIT) 0 0 0      GAD-7    Flowsheet Row Video Visit from 07/10/2023 in [facility] Video Visit from 02/06/2023 in [facility] Video Visit from 12/26/2022 in [facility] Video Visit from 11/13/2022 in [facility] Video Visit from 07/17/2022 in [facility]  Total GAD-7 Score  18 19 18 21 19       PHQ2-9    Flowsheet Row Video Visit from 07/10/2023 in [facility] Video Visit from 02/06/2023 in [facility] Video Visit from 12/26/2022 in [facility] Video Visit from 11/13/2022 in [facility] Counselor from 09/19/2022 in [facility]  PHQ-2 Total Score 1 2 6 6 2   PHQ-9 Total Score -- 6 16 19 6       Flowsheet Row Video Visit from 07/10/2023 in [facility] Video Visit from 02/06/2023 in [facility] Video Visit from 12/26/2022 in [facility]  C-SSRS RISK CATEGORY Low Risk Moderate Risk Moderate Risk        Assessment and Plan:   Karen [name] is a 32 year old, Caucasian female with a past psychiatric history significant for chronic anxiety, bipolar 1 disorder, and chronic PTSD who presents to [facility] via virtual video visit for follow-up and medication management.  Patient presents to the encounter stating that one of her medications is causing her to have restless arms and legs which has impacted her ability to sleep.  Provider discussed with patient that her restlessness may be attributed to her use of mirtazapine.  Provider informed patient to discontinue mirtazapine to see if her restlessness subsides.  For the management of her sleep, provider recommended ramelteon 8 mg at bedtime.  Patient was agreeable to recommendation.  Patient continues to endorse elevated anxiety attributed to stressors in her life.  Despite experiencing anxiety, she reports that her medications have been helpful in managing her symptoms.  Patient to continue taking her other medications as prescribed.  Patient's medications to be e-prescribed to pharmacy of choice.  Collaboration of Care: Collaboration of Care: Medication  Management AEB provider managing patient's psychiatric medications, Psychiatrist  AEB patient being followed by mental health provider at this facility, and Referral or follow-up with counselor/therapist AEB patient being seen by a licensed clinical social worker at this facility  Patient/Guardian was advised Release of Information must be obtained prior to any record release in order to collaborate their care with an outside provider. Patient/Guardian was advised if they have not already done so to contact the registration department to sign all necessary forms in order for [place] to release information regarding their care.   Consent: Patient/Guardian gives verbal consent for treatment and assignment of benefits for services provided during this visit. Patient/Guardian expressed understanding and agreed to proceed.   1. Bipolar 1 disorder (HCC)  - ARIPiprazole (ABILIFY) 20 MG tablet; Take 1 tablet (20 mg total) by mouth daily.  Dispense: 30 tablet; Refill: 1 - lamoTRIgine (LAMICTAL) 100 MG tablet; Take 1 tablet (100 mg total) by mouth 2 (two) times daily.  Dispense: 60 tablet; Refill: 1  2. Chronic anxiety  - gabapentin (NEURONTIN) 300 MG capsule; Take 2 capsules (600 mg total) by mouth 3 (three) times daily.  Dispense: 180 capsule; Refill: 1 - clonazePAM (KLONOPIN) 0.5 MG tablet; Take 0.5 tablets (0.25 mg total) by mouth daily as needed for anxiety.  Dispense: 15 tablet; Refill: 0  3. Chronic post-traumatic stress disorder (PTSD)   4. Insomnia due to other mental disorder  - ramelteon (ROZEREM) 8 MG tablet; Take 1 tablet (8 mg total) by mouth at bedtime.  Dispense: 30 tablet; Refill: 1  Patient to follow up in 6 weeks Provider spent a total of 14 minute with the patient/reviewing the patient's chart  Meta Hatchet, PA 07/10/2023, 7:30 PM

## 2023-07-11 ENCOUNTER — Other Ambulatory Visit (HOSPITAL_COMMUNITY): Payer: Self-pay

## 2023-07-12 ENCOUNTER — Other Ambulatory Visit: Payer: Self-pay

## 2023-07-12 ENCOUNTER — Emergency Department (HOSPITAL_BASED_OUTPATIENT_CLINIC_OR_DEPARTMENT_OTHER)
Admission: EM | Admit: 2023-07-12 | Discharge: 2023-07-12 | Disposition: A | Discharge status: Home / Self Care | Payer: Medicaid Other | Attending: Emergency Medicine | Admitting: Emergency Medicine

## 2023-07-12 ENCOUNTER — Encounter (HOSPITAL_BASED_OUTPATIENT_CLINIC_OR_DEPARTMENT_OTHER): Payer: Self-pay | Admitting: Emergency Medicine

## 2023-07-12 ENCOUNTER — Other Ambulatory Visit (HOSPITAL_BASED_OUTPATIENT_CLINIC_OR_DEPARTMENT_OTHER): Payer: Self-pay

## 2023-07-12 DIAGNOSIS — Z79899 Other long term (current) drug therapy: Secondary | ICD-10-CM | POA: Insufficient documentation

## 2023-07-12 DIAGNOSIS — R112 Nausea with vomiting, unspecified: Secondary | ICD-10-CM | POA: Insufficient documentation

## 2023-07-12 LAB — URINALYSIS, ROUTINE W REFLEX MICROSCOPIC
Bilirubin Urine: NEGATIVE
Glucose, UA: NEGATIVE mg/dL
Nitrite: NEGATIVE
Specific Gravity, Urine: 1.023 (ref 1.005–1.030)
pH: 6 (ref 5.0–8.0)

## 2023-07-12 LAB — CBC
HCT: 43.5 % (ref 36.0–46.0)
Hemoglobin: 15.5 g/dL — ABNORMAL HIGH (ref 12.0–15.0)
MCH: 30.5 pg (ref 26.0–34.0)
MCHC: 35.6 g/dL (ref 30.0–36.0)
MCV: 85.5 fL (ref 80.0–100.0)
Platelets: 313 10*3/uL (ref 150–400)
RBC: 5.09 MIL/uL (ref 3.87–5.11)
RDW: 11.5 % (ref 11.5–15.5)
WBC: 5.8 10*3/uL (ref 4.0–10.5)
nRBC: 0 % (ref 0.0–0.2)

## 2023-07-12 LAB — RAPID URINE DRUG SCREEN, HOSP PERFORMED
Amphetamines: NOT DETECTED
Barbiturates: NOT DETECTED
Benzodiazepines: NOT DETECTED
Cocaine: NOT DETECTED
Opiates: NOT DETECTED
Tetrahydrocannabinol: NOT DETECTED

## 2023-07-12 LAB — COMPREHENSIVE METABOLIC PANEL
ALT: 11 U/L (ref 0–44)
AST: 13 U/L — ABNORMAL LOW (ref 15–41)
Albumin: 4.9 g/dL (ref 3.5–5.0)
Alkaline Phosphatase: 55 U/L (ref 38–126)
Anion gap: 8 (ref 5–15)
BUN: 11 mg/dL (ref 6–20)
CO2: 26 mmol/L (ref 22–32)
Calcium: 9.5 mg/dL (ref 8.9–10.3)
Chloride: 104 mmol/L (ref 98–111)
Creatinine, Ser: 0.77 mg/dL (ref 0.44–1.00)
GFR, Estimated: >60 mL/min (ref >60)
Glucose, Bld: 108 mg/dL — ABNORMAL HIGH (ref 70–99)
Potassium: 3.9 mmol/L (ref 3.5–5.1)
Sodium: 138 mmol/L (ref 135–145)
Total Bilirubin: 0.6 mg/dL (ref 0.3–1.2)
Total Protein: 7.7 g/dL (ref 6.5–8.1)

## 2023-07-12 LAB — LIPASE, BLOOD: Lipase: 18 U/L (ref 11–51)

## 2023-07-12 LAB — PREGNANCY, URINE: Preg Test, Ur: NEGATIVE

## 2023-07-12 MED ORDER — PROMETHAZINE HCL 25 MG PO TABS
25.0000 mg | ORAL_TABLET | Freq: Four times a day (QID) | ORAL | 0 refills | Status: AC | PRN
Start: 2023-07-12 — End: ?
  Filled 2023-07-12: qty 20, 5d supply, fill #0

## 2023-07-12 MED ORDER — SODIUM CHLORIDE 0.9 % IV BOLUS
1000.0000 mL | Freq: Once | INTRAVENOUS | Status: AC
  Administered 2023-07-12: 1000 mL via INTRAVENOUS

## 2023-07-12 MED ORDER — SODIUM CHLORIDE 0.9 % IV SOLN
12.5000 mg | Freq: Once | INTRAVENOUS | Status: AC
  Administered 2023-07-12: 12.5 mg via INTRAVENOUS
  Filled 2023-07-12: qty 0.5

## 2023-07-12 MED ORDER — PROMETHAZINE HCL 25 MG/ML IJ SOLN
INTRAMUSCULAR | Status: AC
  Filled 2023-07-12: qty 1

## 2023-07-12 NOTE — ED Provider Notes (Signed)
[place] EMERGENCY DEPARTMENT AT [facility] Provider Note   CSN: [id number] Arrival date & time: 07/12/23  1153     History  No chief complaint on file.   Karen [name] is a 32 y.o. female.  The history is provided by the patient and medical records. No language interpreter was used.     32 year old female with significant history of recurrent UTI, bipolar disorder, possible use, anxiety, depression, presenting to ED with complaint of persistent nausea.  Patient report for more than a week she has had persistent nausea, occasional having some nonbloody nonbilious vomiting.  She normally has regular loose stools which is unchanged.  She does not endorse any fever or chills no chest pain or shortness of breath no urinary symptoms no significant abdominal pain or back pain no vaginal bleeding or vaginal discharge.  She is currently on a Depo injection and she has not had any recent menstruation.  She mention she was seen at urgent care center yesterday for her complaint.  She was told that her urine has trace of blood in it and therefore they were sent a culture to see if there is any signs of UTI.  She was prescribed Zofran for which she has been taking without adequate relief.  Patient does not endorse any marijuana use or drug use.  States she vapes but otherwise have not smoked tobacco in several years.  No prior abdominal surgeries.  No other medication changes.  Home Medications Prior to Admission medications   Medication Sig Start Date End Date Taking? Authorizing Provider  acetaminophen (TYLENOL) 325 MG tablet Take 2 tablets (650 mg total) by mouth every 6 (six) hours as needed for mild pain or moderate pain. 06/07/21   Hoover Browns, MD  ARIPiprazole (ABILIFY) 20 MG tablet Take 1 tablet (20 mg total) by mouth daily. 07/10/23   Nwoko, Tommas Olp, PA  clonazePAM (KLONOPIN) 0.5 MG tablet Take 0.5 tablets (0.25 mg total) by mouth daily as needed for anxiety. 07/10/23 07/09/24  Nwoko,  Tommas Olp, PA  gabapentin (NEURONTIN) 300 MG capsule Take 2 capsules (600 mg total) by mouth 3 (three) times daily. 07/10/23 07/09/24  Nwoko, Tommas Olp, PA  hydrOXYzine (ATARAX) 25 MG tablet Take 1 tablet (25 mg total) by mouth 3 (three) times daily as needed for anxiety. 02/06/23   Nwoko, Tommas Olp, PA  lamoTRIgine (LAMICTAL) 100 MG tablet Take 1 tablet (100 mg total) by mouth 2 (two) times daily. 07/10/23   Nwoko, Tommas Olp, PA  medroxyPROGESTERone (DEPO-PROVERA) 150 MG/ML injection Inject 150 mg into the muscle every 3 (three) months. Next dose due 07/03/22    [provider]  mirtazapine (REMERON) 7.5 MG tablet Take 1 tablet (7.5 mg total) by mouth at bedtime. 02/06/23   Nwoko, Tommas Olp, PA  Multiple Vitamins-Minerals (MULTIVITAMIN WITH MINERALS) tablet Take 1 tablet by mouth daily.    [provider]  ramelteon (ROZEREM) 8 MG tablet Take 1 tablet (8 mg total) by mouth at bedtime. 07/10/23   Meta Hatchet, PA      Allergies    Ciprofloxacin and Wound dressing adhesive    Review of Systems   Review of Systems  All other systems reviewed and are negative.   Physical Exam Updated Vital Signs BP (!) 107/59 (BP Location: Right Arm)   Pulse 95   Temp 97.8 F (36.6 C)   Resp 16   SpO2 100%  Physical Exam Vitals and nursing note reviewed.  Constitutional:  General: She is not in acute distress.    Appearance: She is well-developed.  HENT:     Head: Atraumatic.  Eyes:     Conjunctiva/sclera: Conjunctivae normal.  Cardiovascular:     Rate and Rhythm: Normal rate and regular rhythm.     Pulses: Normal pulses.     Heart sounds: Normal heart sounds.  Pulmonary:     Effort: Pulmonary effort is normal.  Abdominal:     Palpations: Abdomen is soft.     Tenderness: There is no abdominal tenderness. There is no right CVA tenderness or left CVA tenderness.  Musculoskeletal:     Cervical back: Neck supple.  Skin:    Findings: No rash.  Neurological:     Mental Status: She  is alert.  Psychiatric:        Mood and Affect: Mood normal.     ED Results / Procedures / Treatments   Labs (all labs ordered are listed, but only abnormal results are displayed) Labs Reviewed  COMPREHENSIVE METABOLIC PANEL - Abnormal; Notable for the following components:      Result Value   Glucose, Bld 108 (*)    AST 13 (*)    All other components within normal limits  CBC - Abnormal; Notable for the following components:   Hemoglobin 15.5 (*)    All other components within normal limits  URINALYSIS, ROUTINE W REFLEX MICROSCOPIC - Abnormal; Notable for the following components:   Hgb urine dipstick TRACE (*)    Ketones, ur TRACE (*)    Protein, ur TRACE (*)    Leukocytes,Ua MODERATE (*)    Bacteria, UA RARE (*)    All other components within normal limits  LIPASE, BLOOD  PREGNANCY, URINE  RAPID URINE DRUG SCREEN, HOSP PERFORMED  ETHANOL    EKG None  Radiology No results found.  Procedures Procedures    Medications Ordered in ED Medications  promethazine (PHENERGAN) 25 MG/ML injection (  Canceled Entry 07/12/23 1503)  sodium chloride 0.9 % bolus 1,000 mL (1,000 mLs Intravenous New Bag/Given 07/12/23 1502)  promethazine (PHENERGAN) 12.5 mg in sodium chloride 0.9 % 50 mL IVPB (12.5 mg Intravenous New Bag/Given 07/12/23 1503)    ED Course/ Medical Decision Making/ A&P                                 Medical Decision Making Amount and/or Complexity of Data Reviewed Labs: ordered.   BP 108/67   Pulse 69   Temp 97.8 F (36.6 C)   Resp 18   SpO2 100%   66:19 PM  32 year old female with significant history of recurrent UTI, bipolar disorder, possible use, anxiety, depression, presenting to ED with complaint of persistent nausea.  Patient report for more than a week she has had persistent nausea, occasional having some nonbloody nonbilious vomiting.  She normally has regular loose stools which is unchanged.  She does not endorse any fever or chills no chest pain or  shortness of breath no urinary symptoms no significant abdominal pain or back pain no vaginal bleeding or vaginal discharge.  She is currently on a Depo injection and she has not had any recent menstruation.  She mention she was seen at urgent care center yesterday for her complaint.  She was told that her urine has trace of blood in it and therefore they were sent a culture to see if there is any signs of UTI.  She was prescribed  Zofran for which she has been taking without adequate relief.  Patient does not endorse any marijuana use or drug use.  States she vapes but otherwise have not smoked tobacco in several years.  No prior abdominal surgeries.  No other medication changes.  On exam, this is a well-appearing female resting comfortably in bed appears to be in no acute discomfort.  Heart with normal rate rhythm and lungs are clear to auscultation bilaterally abdomen is soft nontender bowel sounds present in all 4 quadrant.  She does not have any focal point tenderness in her abdomen she does not have any CVA tenderness.  Vital signs overall reassuring no fever no hypoxia.  -Labs ordered, independently viewed and interpreted by me.  Labs remarkable for UA showing moderate leukocytes and rare bacteria. Normal WBC, normal electrolytes panel -The patient was maintained on a cardiac monitor.  I personally viewed and interpreted the cardiac monitored which showed an underlying rhythm of: NSR -Imaging including abd/pelvis CT considered but not performed as abd exam unremarkable -This patient presents to the ED for concern of nausea/vomit, this involves an extensive number of treatment options, and is a complaint that carries with it a high risk of complications and morbidity.  The differential diagnosis includes infection, stress, medication intolerance, SBO, UTI, pyelonephritis -Co morbidities that complicate the patient evaluation includes recurrent UTI, bipolar, substance abuse -Treatment includes NS IVF,  antiemetic -Reevaluation of the patient after these medicines showed that the patient improved -PCP office notes or outside notes reviewed -Escalation to admission/observation considered: patients feels much better, is comfortable with discharge, and will follow up with PCP -Prescription medication considered, patient comfortable with phenergan -Social Determinant of Health considered which includes tobacco use, mental illness  4:03 PM Labs overall reassuring.  After receiving IV fluid and antiemetic patient report feeling much better.  At this time I felt due to lack of urinary discomfort and patient recently had a urine culture obtained at an outside facility, she should only be treated for UTI if culture positive for infection.  Will discharge home with Phenergan as needed for nausea and I gave patient return precaution.  She is able to tolerate p.o.         Final Clinical Impression(s) / ED Diagnoses Final diagnoses:  Non-intractable vomiting with nausea    Rx / DC Orders ED Discharge Orders          Ordered    promethazine (PHENERGAN) 25 MG tablet  Every 6 hours PRN        07/12/23 1605              Fayrene Helper, PA-C 07/12/23 1606    Tegeler, Canary Brim, MD 07/15/23 [phone]

## 2023-07-12 NOTE — ED Triage Notes (Signed)
Nausea x 10 days,  Hot flashes. Seen at [facility] yesterday, given zofran and urine sent for culture No relief with zofran

## 2023-07-22 ENCOUNTER — Ambulatory Visit (INDEPENDENT_AMBULATORY_CARE_PROVIDER_SITE_OTHER): Payer: No Payment, Other | Admitting: Licensed Clinical Social Worker

## 2023-07-22 DIAGNOSIS — F4312 Post-traumatic stress disorder, chronic: Secondary | ICD-10-CM | POA: Diagnosis not present

## 2023-07-22 DIAGNOSIS — F319 Bipolar disorder, unspecified: Secondary | ICD-10-CM

## 2023-07-22 NOTE — Progress Notes (Signed)
THERAPIST PROGRESS NOTE  Virtual Visit via Video Note  I connected with [facility] on 07/22/23 at  3:00 PM EDT by a video enabled telemedicine application and verified that I am speaking with the correct person using two identifiers.  Location: Patient: [facility] Provider: Providers Home    I discussed the limitations of evaluation and management by telemedicine and the availability of in person appointments. The patient expressed understanding and agreed to proceed.     I discussed the assessment and treatment plan with the patient. The patient was provided an opportunity to ask questions and all were answered. The patient agreed with the plan and demonstrated an understanding of the instructions.   The patient was advised to call back or seek an in-person evaluation if the symptoms worsen or if the condition fails to improve as anticipated.  I provided 55 minutes of non-face-to-face time during this encounter.   [name], [name]   Participation Level: Active  Behavioral Response: CasualAlertAnxious and Depressed  Type of Therapy: Individual Therapy  Treatment Goals addressed:  Active     Bipolar Disorder CCP Problem  1 bipolar 1 disorder       walk 3 x weekly or workout  (Progressing)     Start:  03/07/22    Expected End:  09/06/23         maintain 40 hour a week employment  (Completed/Met)     Start:  03/07/22    Expected End:  10/04/22    Resolved:  08/08/22      LTG: Stabilize mood and increase goal-directed behavior: decrease PHQ-9 below 10  (Not Progressing)     Start:  03/07/22    Expected End:  09/06/23         STG: [name] WILL COOPERATE WITH A PSYCHIATRIC EVALUATION ON 10/05/2020  AND DURING ALL FOLLOW-UP VISITS (Completed/Met)     Start:  03/07/22    Expected End:  10/04/22    Resolved:  03/26/22      STG: [name] WILL IDENTIFY COGNITIVE PATTERNS AND BELIEFS THAT INTERFERE WITH THERAPY (Progressing)     Start:  03/07/22     Expected End:  09/06/23          decrease GAD-7 below 5  (Not Progressing)     Start:  03/07/22    Expected End:  09/06/23            WORK WITH [name] TO DISCUSS RISKS AND BENEFITS OF MEDICATION TREATMENT OPTIONS FOR THIS PROBLEM AND PRESCRIBE AS INDICATED (Completed)     Start:  03/07/22    End:  03/14/22      Conduct a review of all medications to evaluate any drug/drug interactions (Completed)     Start:  03/07/22    End:  03/14/22      REVIEW WITH [name] THEIR RESPONSE TO THE PRESCRIBED MEDICATION, INCLUDING ANY SIDE EFFECTS (Completed)     Start:  03/07/22    End:  03/14/22      ENCOURAGE Ambrosia TO KEEP A LOG OF MEDICATION SIDE EFFECTS, QUESTIONS REGARDING MEDICATION, AND SYMPTOMS (Completed)     Start:  03/07/22    End:  03/26/22      INSTRUCT Geniece TO TAKE PSYCHOTROPIC MEDICATION AS PRESCRIBED (Completed)     Start:  03/07/22    End:  03/14/22      INSTRUCT Sheralee TO COMMUNICATE EFFECTS OF PRESCRIBED MEDICATIONS (Completed)     Start:  03/07/22    End:  03/26/22  EDUCATE Vonnie ON BIPOLAR DISORDER DIAGNOSTIC CRITERIA (Completed)     Start:  03/07/22    End:  03/14/22      WORK WITH [name] TO DEVELOP A LIST OF AT LEAST 1 CONSEQUENCES OF UNTREATED BIPOLAR SYMPTOMS (Completed)     Start:  03/07/22    End:  03/14/22         ProgressTowards Goals: Progressing  Interventions: CBT and Motivational Interviewing   Suicidal/Homicidal: Nowithout intent/plan  Therapist Response:   Pt was alert and oriented x 5. She was dressed casually and engaged well in therapy session. She was pleasant, cooperative and maintained good eye contact. Kameela presented with depressed and anxious mood/affect.   Primary stressor for pt is son [name]. She reports he was Dx with a impulse control disorder. He will be following up with a children's play therapist to help find out proper intervention on how to help her sons' impulse control. Cyndee reports she feels judged  by parents and teachers because of previous experiences she has had with [name] old school and parents she has interacted with. Aiyla reports that she wants to get him into extracurricular activities to help [name] with discipline such as Karate.  Interventions/Plan: [name] explained to pt the difference between consequences and positive reinforcement. [name] used supportive therapy for praise and encouragement. [name] used motivational interviewing for positive affirmations, reflective listening and open ended questions.      Plan: Return again in 3 weeks.  Diagnosis: Bipolar 1 disorder (HCC)  Chronic post-traumatic stress disorder (PTSD)  Collaboration of Care: Other none today   Patient/Guardian was advised Release of Information must be obtained prior to any record release in order to collaborate their care with an outside provider. Patient/Guardian was advised if they have not already done so to contact the registration department to sign all necessary forms in order for [place] to release information regarding their care.   Consent: Patient/Guardian gives verbal consent for treatment and assignment of benefits for services provided during this visit. Patient/Guardian expressed understanding and agreed to proceed.   [name], [name] 07/22/2023

## 2023-07-28 ENCOUNTER — Encounter (HOSPITAL_COMMUNITY): Payer: Self-pay | Admitting: Family

## 2023-08-12 ENCOUNTER — Ambulatory Visit (INDEPENDENT_AMBULATORY_CARE_PROVIDER_SITE_OTHER): Payer: No Payment, Other | Admitting: Licensed Clinical Social Worker

## 2023-08-12 DIAGNOSIS — F319 Bipolar disorder, unspecified: Secondary | ICD-10-CM | POA: Diagnosis not present

## 2023-08-12 NOTE — Progress Notes (Signed)
THERAPIST PROGRESS NOTE  Virtual Visit via Video Note  I connected with Karen [name] on 08/12/23 at  3:00 PM EDT by a video enabled telemedicine application and verified that I am speaking with the correct person using two identifiers.  Location: Patient: Karen [name]  Provider: Providers Home    I discussed the limitations of evaluation and management by telemedicine and the availability of in person appointments. The patient expressed understanding and agreed to proceed.   I discussed the assessment and treatment plan with the patient. The patient was provided an opportunity to ask questions and all were answered. The patient agreed with the plan and demonstrated an understanding of the instructions.   The patient was advised to call back or seek an in-person evaluation if the symptoms worsen or if the condition fails to improve as anticipated.  I provided 55 minutes of non-face-to-face time during this encounter.   Karen Cooks, [name]   Participation Level: Active  Behavioral Response: CasualAlertAnxious and Depressed  Type of Therapy: Individual Therapy  Treatment Goals addressed:  Active     Bipolar Disorder CCP Problem  1 bipolar 1 disorder       walk 3 x weekly or workout  (Progressing)     Start:  03/07/22    Expected End:  09/06/23         maintain 40 hour a week employment  (Completed/Met)     Start:  03/07/22    Expected End:  10/04/22    Resolved:  08/08/22      LTG: Stabilize mood and increase goal-directed behavior: decrease PHQ-9 below 10  (Not Progressing)     Start:  03/07/22    Expected End:  09/06/23         STG: Karen [name] WILL COOPERATE WITH A PSYCHIATRIC EVALUATION ON 10/05/2020  AND DURING ALL FOLLOW-UP VISITS (Completed/Met)     Start:  03/07/22    Expected End:  10/04/22    Resolved:  03/26/22      STG: Karen [name] WILL IDENTIFY COGNITIVE PATTERNS AND BELIEFS THAT INTERFERE WITH THERAPY (Progressing)     Start:  03/07/22     Expected End:  09/06/23          decrease GAD-7 below 5  (Not Progressing)     Start:  03/07/22    Expected End:  09/06/23            WORK WITH Karen [name] TO DISCUSS RISKS AND BENEFITS OF MEDICATION TREATMENT OPTIONS FOR THIS PROBLEM AND PRESCRIBE AS INDICATED (Completed)     Start:  03/07/22    End:  03/14/22      Conduct a review of all medications to evaluate any drug/drug interactions (Completed)     Start:  03/07/22    End:  03/14/22      REVIEW WITH Karen [name] THEIR RESPONSE TO THE PRESCRIBED MEDICATION, INCLUDING ANY SIDE EFFECTS (Completed)     Start:  03/07/22    End:  03/14/22      ENCOURAGE Karen [name] TO KEEP A LOG OF MEDICATION SIDE EFFECTS, QUESTIONS REGARDING MEDICATION, AND SYMPTOMS (Completed)     Start:  03/07/22    End:  03/26/22      INSTRUCT Karen [name] TO TAKE PSYCHOTROPIC MEDICATION AS PRESCRIBED (Completed)     Start:  03/07/22    End:  03/14/22      INSTRUCT Karen [name] TO COMMUNICATE EFFECTS OF PRESCRIBED MEDICATIONS (Completed)     Start:  03/07/22    End:  03/26/22  EDUCATE Karen [name] ON BIPOLAR DISORDER DIAGNOSTIC CRITERIA (Completed)     Start:  03/07/22    End:  03/14/22      WORK WITH Karen [name] TO DEVELOP A LIST OF AT LEAST 1 CONSEQUENCES OF UNTREATED BIPOLAR SYMPTOMS (Completed)     Start:  03/07/22    End:  03/14/22            ProgressTowards Goals: Progressing  Interventions: CBT, Motivational Interviewing, and Supportive   Suicidal/Homicidal: Nowithout intent/plan  Therapist Response:     Pt was alert and oriented x 5. She was dressed casually and engaged well in therapy session. She presented with depressed and anxious mood/affect. She was pleasant, cooperative and maintained good eye contact.  Pt comes in today with stressors for school and her child. She reports she is attempting to balance school, being a mom, and work. Karen [name] endorses symptoms for tension and worry. She reports that she is struggling with organization. [name]  states she has made her classes for Tuesdays and Thursdays which has helped and then working Wed and Fridays. Karen [name] states she has got Karen [name] into therapy which has helped relieved some of her stress. Pt reports she has also got him involved in sports for Hockey to help with discipline.  Interventions/Plan: [name] used psychoanalytic therapy for pt to express, thoughts, feelings and emotions. [name] used supportive therapy for praise and encouragement. [name] educated pt on the use of a planner. [name] used motivational interviewing for open ended questions and reflective listening.   Plan: Return again in 3 weeks.  Diagnosis: Bipolar 1 disorder (HCC)  Collaboration of Care: Other None today   Patient/Guardian was advised Release of Information must be obtained prior to any record release in order to collaborate their care with an outside provider. Patient/Guardian was advised if they have not already done so to contact the registration department to sign all necessary forms in order for [place] to release information regarding their care.   Consent: Patient/Guardian gives verbal consent for treatment and assignment of benefits for services provided during this visit. Patient/Guardian expressed understanding and agreed to proceed.   Karen Cooks, [name] 08/12/2023

## 2023-08-21 ENCOUNTER — Telehealth (HOSPITAL_COMMUNITY): Payer: No Payment, Other | Admitting: Physician Assistant

## 2023-08-21 ENCOUNTER — Encounter (HOSPITAL_COMMUNITY): Payer: Self-pay | Admitting: Physician Assistant

## 2023-08-21 DIAGNOSIS — F419 Anxiety disorder, unspecified: Secondary | ICD-10-CM

## 2023-08-21 DIAGNOSIS — F319 Bipolar disorder, unspecified: Secondary | ICD-10-CM

## 2023-08-21 DIAGNOSIS — F4312 Post-traumatic stress disorder, chronic: Secondary | ICD-10-CM | POA: Diagnosis not present

## 2023-08-21 DIAGNOSIS — F5105 Insomnia due to other mental disorder: Secondary | ICD-10-CM | POA: Diagnosis not present

## 2023-08-21 MED ORDER — LAMOTRIGINE 100 MG PO TABS: 100.0000 mg | ORAL_TABLET | Freq: Two times a day (BID) | ORAL | 1 refills | Status: DC

## 2023-08-21 MED ORDER — CLONAZEPAM 0.5 MG PO TABS: 0.5000 mg | ORAL_TABLET | Freq: Every day | ORAL | 0 refills | Status: AC | PRN

## 2023-08-21 MED ORDER — RAMELTEON 8 MG PO TABS
8.0000 mg | ORAL_TABLET | Freq: Every day | ORAL | 1 refills | Status: DC
Start: 2023-08-21 — End: 2023-10-09

## 2023-08-21 MED ORDER — ARIPIPRAZOLE 20 MG PO TABS
20.0000 mg | ORAL_TABLET | Freq: Every day | ORAL | 1 refills | Status: DC
Start: 2023-08-21 — End: 2023-10-09

## 2023-08-21 MED ORDER — GABAPENTIN 300 MG PO CAPS
600.0000 mg | ORAL_CAPSULE | Freq: Three times a day (TID) | ORAL | 1 refills | Status: DC
Start: 2023-08-21 — End: 2023-10-09

## 2023-08-21 NOTE — Progress Notes (Signed)
BH MD/PA/NP OP Progress Note  Virtual Visit via Video Note  I connected with Karen [name] on 08/21/23 at  2:30 PM EDT by a video enabled telemedicine application and verified that I am speaking with the correct person using two identifiers.  Location: Patient: Home Provider: Clinic   I discussed the limitations of evaluation and management by telemedicine and the availability of in person appointments. The patient expressed understanding and agreed to proceed.  Follow Up Instructions:  I discussed the assessment and treatment plan with the patient. The patient was provided an opportunity to ask questions and all were answered. The patient agreed with the plan and demonstrated an understanding of the instructions.   The patient was advised to call back or seek an in-person evaluation if the symptoms worsen or if the condition fails to improve as anticipated.  I provided 11 minutes of non-face-to-face time during this encounter.  Meta Hatchet, PA    08/21/2023 5:51 PM Karen [name]  MRN:  [id number]  Chief Complaint:  Chief Complaint  Patient presents with   Follow-up   Medication Management   HPI:   Karen [name] is a 32 year old, Caucasian female with a past psychiatric history significant for chronic anxiety, bipolar 1 disorder, and chronic PTSD who presents to [facility] via virtual video visit for follow-up and medication management.  Patient is currently being managed on the following psychiatric medications:  Ramelteon 8 mg at bedtime Gabapentin 600 mg 3 times daily Lamotrigine 100 mg 2 times daily Abilify 20 mg daily Clonazepam 0.25 mg daily as needed  Patient presents to the encounter stating that she has been having a stressful time as of late.  She reports that her dog recently had surgery on his back legs and she is providing around-the-clock care as he recovers.  Patient reports that she is also working,  being a single mother, and taking 3 difficult courses for class.  In regards to her mood, patient reports that it is stable.  She denies feeling depressed or experiencing any manic symptoms.  She does report that her anxiety has gotten worse but states that it is mostly situational.  Patient rates her anxiety an 8 out of 10 and states that she has been trying to not rely on her use of clonazepam to manage her symptoms while utilizing coping skills.  A stressor that has been bothering the patient is that her child just recently got diagnosed with impulse control and anxiety.  At times, she reports that her anxiety and her child's anxiety often bounce off each other.  She reports that she is trying to appear less anxious in front of her child so as not to worsen her child's anxiety.  A GAD-7 screen was performed with the patient scoring a 14.  Patient is alert and oriented x 4, calm, cooperative, and fully engaged in conversation during the encounter.  Patient endorses anxious mood.  Patient denies suicidal or homicidal ideations.  She further denies auditory or visual hallucinations and does not appear to be responding to internal/external stimuli.  Patient endorses good sleep and receives on average 10 hours of sleep per night.  Patient endorses fair appetite and eats on average 2 meals per day.  Patient denies alcohol consumption, tobacco use, or illicit drug use.  Visit Diagnosis:    ICD-10-CM   1. Insomnia due to other mental disorder  F51.05 ramelteon (ROZEREM) 8 MG tablet   F99  2. Chronic anxiety  F41.9 gabapentin (NEURONTIN) 300 MG capsule    clonazePAM (KLONOPIN) 0.5 MG tablet    3. Bipolar 1 disorder (HCC)  F31.9 ARIPiprazole (ABILIFY) 20 MG tablet    lamoTRIgine (LAMICTAL) 100 MG tablet       Past Psychiatric History:  Bipolar disorder Generalized anxiety disorder Visual hallucinations Chronic PTSD  Past Medical History:  Past Medical History:  Diagnosis Date   Anxiety     Bipolar disorder (HCC)    Chronic UTI    Depression    Endometriosis    HSV (herpes simplex virus) anogenital infection    PTSD (post-traumatic stress disorder)     Past Surgical History:  Procedure Laterality Date   CESAREAN SECTION N/A 02/17/2018   Procedure: CESAREAN SECTION;  Surgeon: Essie Hart, MD;  Location: [facility];  Service: Obstetrics;  Laterality: N/A;   COLONOSCOPY     DIAGNOSTIC LAPAROSCOPY  11/05/2012   DILATION AND EVACUATION N/A 03/11/2015   Procedure: DILATATION AND EVACUATION;  Surgeon: Hoover Browns, MD;  Location: [facility] ORS;  Service: Gynecology;  Laterality: N/A;   DRUG INDUCED ENDOSCOPY     LAPAROSCOPY     LAPAROSCOPY N/A 06/07/2021   Procedure: LAPAROSCOPY OPERATIVE with /EXCISION OF LESIONS and lysis of adhesions;  Surgeon: Hoover Browns, MD;  Location: [facility] OR;  Service: Gynecology;  Laterality: N/A;   TONSILLECTOMY  11/05/2013    Family Psychiatric History:  Mother - Major Depressive Disorder Sister - Obsessive Compulsive Disorder Aunt (maternal) - Bipolar I Disorder Uncle (maternal) - Bipolar I Disorder  Family History:  Family History  Problem Relation Age of Onset   Rheum arthritis Mother    Cancer Father     Social History:  Social History   Socioeconomic History   Marital status: Married    Spouse name: Jaymie Mckiddy   Number of children: 1   Years of education: Not on file   Highest education level: Some college, no degree  Occupational History   Not on file  Tobacco Use   Smoking status: Every Day    Types: E-cigarettes   Smokeless tobacco: Never   Tobacco comments:    PT VAPES DAILY, '' I vape a lot ''   Vaping Use   Vaping status: Every Day   Substances: Nicotine, Flavoring  Substance and Sexual Activity   Alcohol use: Not Currently   Drug use: Not Currently    Types: Heroin, Cocaine, Marijuana    Comment: Clean since Aug 2015   Sexual activity: Yes    Partners: Male  Other Topics Concern   Not on file  Social History  Narrative   Not on file   Social Determinants of Health   Financial Resource Strain: Medium Risk (03/07/2022)   Overall Financial Resource Strain (CARDIA)    Difficulty of Paying Living Expenses: Somewhat hard  Food Insecurity: No Food Insecurity (03/07/2022)   Hunger Vital Sign    Worried About Running Out of Food in the Last Year: Never true    Ran Out of Food in the Last Year: Never true  Transportation Needs: No Transportation Needs (03/07/2022)   PRAPARE - Administrator, Civil Service (Medical): No    Lack of Transportation (Non-Medical): No  Physical Activity: Sufficiently Active (03/07/2022)   Exercise Vital Sign    Days of Exercise per Week: 3 days    Minutes of Exercise per Session: 60 min  Stress: Stress Concern Present (03/07/2022)   [employer] of Occupational Health - Occupational  Stress Questionnaire    Feeling of Stress : Rather much  Social Connections: Moderately Isolated (03/07/2022)   Social Connection and Isolation Panel [NHANES]    Frequency of Communication with Friends and Family: More than three times a week    Frequency of Social Gatherings with Friends and Family: More than three times a week    Attends Religious Services: Never    Database administrator or Organizations: Yes    Attends Engineer, structural: More than 4 times per year    Marital Status: Separated    Allergies:  Allergies  Allergen Reactions   Ciprofloxacin Hives   Wound Dressing Adhesive Rash    Metabolic Disorder Labs: Lab Results  Component Value Date   HGBA1C 4.6 (L) 07/02/2022   MPG 85.32 07/02/2022   MPG 85.32 09/11/2020   No results found for: "PROLACTIN" Lab Results  Component Value Date   CHOL 117 07/02/2022   TRIG 94 07/02/2022   HDL 29 (L) 07/02/2022   CHOLHDL 4.0 07/02/2022   VLDL 19 07/02/2022   LDLCALC 69 07/02/2022   LDLCALC 78 05/09/2021   Lab Results  Component Value Date   TSH 0.519 07/02/2022   TSH 0.784 09/11/2020     Therapeutic Level Labs: Lab Results  Component Value Date   LITHIUM 0.27 (L) 07/06/2022   No results found for: "VALPROATE" No results found for: "CBMZ"  Current Medications: Current Outpatient Medications  Medication Sig Dispense Refill   acetaminophen (TYLENOL) 325 MG tablet Take 2 tablets (650 mg total) by mouth every 6 (six) hours as needed for mild pain or moderate pain. 30 tablet 1   ARIPiprazole (ABILIFY) 20 MG tablet Take 1 tablet (20 mg total) by mouth daily. 30 tablet 1   clonazePAM (KLONOPIN) 0.5 MG tablet Take 1 tablet (0.5 mg total) by mouth daily as needed for anxiety. 30 tablet 0   gabapentin (NEURONTIN) 300 MG capsule Take 2 capsules (600 mg total) by mouth 3 (three) times daily. 180 capsule 1   hydrOXYzine (ATARAX) 25 MG tablet Take 1 tablet (25 mg total) by mouth 3 (three) times daily as needed for anxiety. 75 tablet 1   lamoTRIgine (LAMICTAL) 100 MG tablet Take 1 tablet (100 mg total) by mouth 2 (two) times daily. 60 tablet 1   medroxyPROGESTERone (DEPO-PROVERA) 150 MG/ML injection Inject 150 mg into the muscle every 3 (three) months. Next dose due 07/03/22     mirtazapine (REMERON) 7.5 MG tablet Take 1 tablet (7.5 mg total) by mouth at bedtime. 30 tablet 1   Multiple Vitamins-Minerals (MULTIVITAMIN WITH MINERALS) tablet Take 1 tablet by mouth daily.     promethazine (PHENERGAN) 25 MG tablet Take 1 tablet (25 mg total) by mouth every 6 (six) hours as needed for nausea or vomiting. 20 tablet 0   ramelteon (ROZEREM) 8 MG tablet Take 1 tablet (8 mg total) by mouth at bedtime. 30 tablet 1   No current facility-administered medications for this visit.     Musculoskeletal: Strength & Muscle Tone: within normal limits Gait & Station: normal Patient leans: N/A  Psychiatric Specialty Exam: Review of Systems  Psychiatric/Behavioral:  Negative for decreased concentration, dysphoric mood, hallucinations, self-injury, sleep disturbance and suicidal ideas. The patient is  nervous/anxious. The patient is not hyperactive.     There were no vitals taken for this visit.There is no height or weight on file to calculate BMI.  General Appearance: Casual  Eye Contact:  Good  Speech:  Clear and Coherent and Normal  Rate  Volume:  Normal  Mood:  Anxious  Affect:  Congruent  Thought Process:  Coherent, Goal Directed, and Descriptions of Associations: Intact  Orientation:  Full (Time, Place, and Person)  Thought Content: WDL   Suicidal Thoughts:  No  Homicidal Thoughts:  No  Memory:  Immediate;   Good Recent;   Good Remote;   Good  Judgement:  Good  Insight:  Good  Psychomotor Activity:  Normal  Concentration:  Concentration: Good and Attention Span: Good  Recall:  Good  Fund of Knowledge: Good  Language: Good  Akathisia:  No  Handed:  Ambidextrous  AIMS (if indicated): not done  Assets:  Communication Skills Desire for Improvement Housing Social Support Vocational/Educational  ADL's:  Intact  Cognition: WNL  Sleep:  Good   Screenings: AIMS    Flowsheet Row Ad[facility] (Discharged) [facility] Ad[facility] (Discharged[facility] from [facility] Ad[facility] (Discharged) from [facility]  AIMS Total Score 0 0 0      AUDIT    Flowsheet Row Admission (Discharged) [facility] Admission (Discharged[facility] from [facility] Admission (Discharged) from [facility]  Alcohol Use Disorder Identification Test Final Score (AUDIT) 0 0 0      GAD-7    Flowsheet Row Video Visit from 08/21/2023 in [facility] Video Visit from 07[facility]10/2023 in [facility] Video  Visit from 02/06/2023 in [facility] Video Visit from 12/26/2022 in [facility] Video Visit from 11/13/2022 in [facility]  Total GAD-7 Score 14 18 19 18 21       PHQ2-9    Flowsheet Row Video Visit from 08/21/2023 in [facility] Video Visit from 07/10/2023 in [facility] Video Visit from 02/06/2023 in [facility] Video Visit from 12/26/2022 in [facility] Video Visit from 11/13/2022 in [facility]  PHQ-2 Total Score 0 1 2 6 6   PHQ-9 Total Score -- -- 6 16 19       Flowsheet Row Video Visit from 08/21/2023 in [facility] ED from 07/12/2023 in [facility] Video Visit from 07/10/2023 in [facility]  C-SSRS RISK CATEGORY High Risk No Risk Low Risk        Assessment and Plan:   Karen [name] is a 32 year old, Caucasian female with a past psychiatric history significant for chronic anxiety, bipolar 1 disorder, and chronic PTSD who presents to [facility] via virtual video visit for follow-up and medication management.  Patient presents to the encounter endorsing several life stressors that have been contributing to her elevated anxiety.  Patient endorses the following stressors: dog recently having surgery, being a single mother, and taking 3 difficult courses for school.  Although patient endorses elevated anxiety, she states that her mood has been mostly stable and denies feeling depressed or experiencing manic symptoms.  She reports that she has tried to rely less on using clonazepam for managing her anxiety while leaning more heavily towards utilizing coping skills.  Patient is currently taking  clonazepam 0.25 mg as needed for the management  of her anxiety.   Due to patient's stressors in the setting of her worsening anxiety, provider to adjust patient's clonazepam dosage from 0.25 mg to 0.5 mg as needed for the management of her worsening anxiety.  Provider discussed with patient that her newly adjusted clonazepam with only be for a short period of time until her generalized stressors have become more tolerable.  Patient vocalized understanding.  Patient to continue taking all other medications as prescribed.  Patient's medications to be e-prescribed to pharmacy of choice.  Collaboration of Care: Collaboration of Care: Medication Management AEB provider managing patient's psychiatric medications, Psychiatrist AEB patient being followed by mental health provider at this facility, and Referral or follow-up with counselor/therapist AEB patient being seen by a licensed clinical social worker at this facility  Patient/Guardian was advised Release of Information must be obtained prior to any record release in order to collaborate their care with an outside provider. Patient/Guardian was advised if they have not already done so to contact the registration department to sign all necessary forms in order for [place] to release information regarding their care.   Consent: Patient/Guardian gives verbal consent for treatment and assignment of benefits for services provided during this visit. Patient/Guardian expressed understanding and agreed to proceed.   1. Insomnia due to other mental disorder  - ramelteon (ROZEREM) 8 MG tablet; Take 1 tablet (8 mg total) by mouth at bedtime.  Dispense: 30 tablet; Refill: 1  2. Chronic anxiety  - gabapentin (NEURONTIN) 300 MG capsule; Take 2 capsules (600 mg total) by mouth 3 (three) times daily.  Dispense: 180 capsule; Refill: 1 - clonazePAM (KLONOPIN) 0.5 MG tablet; Take 1 tablet (0.5 mg total) by mouth daily as needed for anxiety.  Dispense: 30 tablet; Refill: 0  3. Bipolar 1 disorder (HCC)  - ARIPiprazole  (ABILIFY) 20 MG tablet; Take 1 tablet (20 mg total) by mouth daily.  Dispense: 30 tablet; Refill: 1 - lamoTRIgine (LAMICTAL) 100 MG tablet; Take 1 tablet (100 mg total) by mouth 2 (two) times daily.  Dispense: 60 tablet; Refill: 1  4. Chronic post-traumatic stress disorder (PTSD)  Patient to follow up in 6 weeks Provider spent a total of 11 minute with the patient/reviewing the patient's chart  Meta Hatchet, PA 08/21/2023, 5:51 PM

## 2023-09-02 ENCOUNTER — Ambulatory Visit (INDEPENDENT_AMBULATORY_CARE_PROVIDER_SITE_OTHER): Payer: No Payment, Other | Admitting: Licensed Clinical Social Worker

## 2023-09-02 DIAGNOSIS — F319 Bipolar disorder, unspecified: Secondary | ICD-10-CM

## 2023-09-02 DIAGNOSIS — F411 Generalized anxiety disorder: Secondary | ICD-10-CM

## 2023-09-02 NOTE — Progress Notes (Unsigned)
THERAPIST PROGRESS NOTE  Virtual Visit via Video Note  I connected with Elantra Bianconi on 09/03/23 at  4:00 PM EDT by a video enabled telemedicine application and verified that I am speaking with the correct person using two identifiers.  Location: Patient: [facility]  Provider: Providers Home    I discussed the limitations of evaluation and management by telemedicine and the availability of in person appointments. The patient expressed understanding and agreed to proceed.  I discussed the assessment and treatment plan with the patient. The patient was provided an opportunity to ask questions and all were answered. The patient agreed with the plan and demonstrated an understanding of the instructions.    The patient was advised to call back or seek an in-person evaluation if the symptoms worsen or if the condition fails to improve as anticipated.  I provided 60 minutes of non-face-to-face time during this encounter.  [name], [name]   Participation Level: Active  Behavioral Response: CasualAlertAnxious and Depressed  Type of Therapy: Individual Therapy  Treatment Goals addressed:  Active     Bipolar Disorder CCP Problem  1 bipolar 1 disorder       walk 3 x weekly or workout  (Progressing)     Start:  03/07/22    Expected End:  09/06/23         maintain 40 hour a week employment  (Completed/Met)     Start:  03/07/22    Expected End:  10/04/22    Resolved:  08/08/22      LTG: Stabilize mood and increase goal-directed behavior: decrease PHQ-9 below 10  (Not Progressing)     Start:  03/07/22    Expected End:  09/06/23         STG: [name] WILL COOPERATE WITH A PSYCHIATRIC EVALUATION ON 10/05/2020  AND DURING ALL FOLLOW-UP VISITS (Completed/Met)     Start:  03/07/22    Expected End:  10/04/22    Resolved:  03/26/22      STG: [name] WILL IDENTIFY COGNITIVE PATTERNS AND BELIEFS THAT INTERFERE WITH THERAPY (Progressing)     Start:  03/07/22     Expected End:  09/06/23          decrease GAD-7 below 5  (Not Progressing)     Start:  03/07/22    Expected End:  09/06/23            WORK WITH [name] TO DISCUSS RISKS AND BENEFITS OF MEDICATION TREATMENT OPTIONS FOR THIS PROBLEM AND PRESCRIBE AS INDICATED (Completed)     Start:  03/07/22    End:  03/14/22      Conduct a review of all medications to evaluate any drug/drug interactions (Completed)     Start:  03/07/22    End:  03/14/22      REVIEW WITH [name] THEIR RESPONSE TO THE PRESCRIBED MEDICATION, INCLUDING ANY SIDE EFFECTS (Completed)     Start:  03/07/22    End:  03/14/22      ENCOURAGE Koya TO KEEP A LOG OF MEDICATION SIDE EFFECTS, QUESTIONS REGARDING MEDICATION, AND SYMPTOMS (Completed)     Start:  03/07/22    End:  03/26/22      INSTRUCT Terra TO TAKE PSYCHOTROPIC MEDICATION AS PRESCRIBED (Completed)     Start:  03/07/22    End:  03/14/22      INSTRUCT Michaila TO COMMUNICATE EFFECTS OF PRESCRIBED MEDICATIONS (Completed)     Start:  03/07/22    End:  03/26/22  EDUCATE Ameriah ON BIPOLAR DISORDER DIAGNOSTIC CRITERIA (Completed)     Start:  03/07/22    End:  03/14/22      WORK WITH [name] TO DEVELOP A LIST OF AT LEAST 1 CONSEQUENCES OF UNTREATED BIPOLAR SYMPTOMS (Completed)     Start:  03/07/22    End:  03/14/22         ProgressTowards Goals: Progressing  Interventions: CBT, Motivational Interviewing, and Supportive  Suicidal/Homicidal: Nowithout intent/plan  Patient was alert and oriented x 5.  Patient was pleasant, cooperative, maintained good eye contact.  She engaged well in therapy session was dressed casually.  Marlisha was alert and oriented x 5.  Patient reports primary stressors as school, work, and family.  Patient reports that she is currently balancing her degree along with her other duties of work and her son.  Patient reports that things have been going "overall well".  But she is still overall stressed due to her Class load.   Avagail reports struggling in classes that do not apply to her degree but are needed for general requirements.  Interventions\plan: [name] psycho analytic therapy for patient to express thoughts, feelings and emotions and nonjudgmental environment.  [name] supportive therapy for praise and encouragement.  [name] used motivational interviewing for positive affirmations.  [name] educated patient on how organization can decrease stress and tension. Plan: Return again in 3 weeks.  Diagnosis: Bipolar 1 disorder (HCC)  GAD (generalized anxiety disorder)  Collaboration of Care: Other None today   Patient/Guardian was advised Release of Information must be obtained prior to any record release in order to collaborate their care with an outside provider. Patient/Guardian was advised if they have not already done so to contact the registration department to sign all necessary forms in order for [place] to release information regarding their care.   Consent: Patient/Guardian gives verbal consent for treatment and assignment of benefits for services provided during this visit. Patient/Guardian expressed understanding and agreed to proceed.   [name], [name] 09/03/2023

## 2023-09-17 ENCOUNTER — Telehealth (HOSPITAL_COMMUNITY): Payer: Self-pay | Admitting: Physician Assistant

## 2023-09-17 ENCOUNTER — Other Ambulatory Visit (HOSPITAL_COMMUNITY): Payer: Self-pay | Admitting: Physician Assistant

## 2023-09-17 DIAGNOSIS — F419 Anxiety disorder, unspecified: Secondary | ICD-10-CM

## 2023-09-17 MED ORDER — ESCITALOPRAM OXALATE 10 MG PO TABS
10.0000 mg | ORAL_TABLET | Freq: Every day | ORAL | 1 refills | Status: DC
Start: 2023-09-17 — End: 2023-10-09

## 2023-09-17 NOTE — Progress Notes (Signed)
Provider was contacted by Karen [name] regarding patient's concerns over her current medication regimen.  Provider was able to reach out to patient to discuss concerns.  Patient informed provider that she had stopped taking her Klonopin because it was not effective in managing her anxiety.  She reports that her anxiety is usually a 10 out of 10 with occasions where she may lower it to an 8 or 9 out of 10.   Due to her anxiety, patient reports that she has been unable to function.  She reports that she has difficulty paying attention in class due to her anxiety and states that she is failing some classes.  She also reports that she gets easily frustrated with her child due to her anxiety.  Patient reports that she stopped taking her Klonopin because it made her more tired as opposed to relieving her anxiety.  She reports that she tried using gabapentin to help with her anxiety, which she found minimally effective.  Patient endorses constant impending doom and excessive worrying.  She has tried meditation and coping skills to help alleviate her symptoms but nothing appears to be effective.  Patient discussed with provider about being placed on medications that she has used in the past for the management of her anxiety.  Provider discussed with patient about placing her back on Lexapro.  It should be noted that patient experienced dissociations while on the medication; however, patient was agreeable to start the medication if it was helpful in managing her anxiety.  Patient to be placed on Lexapro 10 mg daily for the management of her anxiety.  Prior to the conclusion of the encounter, provider discussed with patient about using Topamax as an off-label agent for the management of her anxiety.  Provider also discussed potentially placing her on Loreev (long-acting lorazepam) for the management of her anxiety.  We will determine if patient will be placed on either of these medications if Lexapro is ineffective in  managing her anxiety.

## 2023-09-17 NOTE — Telephone Encounter (Signed)
Message acknowledged and reviewed.  Provider was able to reach out to patient to discuss medication options.  Patient was placed on Lexapro 10 mg daily for the management of her anxiety.

## 2023-09-23 ENCOUNTER — Telehealth (HOSPITAL_COMMUNITY): Payer: Self-pay | Admitting: Physician Assistant

## 2023-09-26 ENCOUNTER — Ambulatory Visit (HOSPITAL_COMMUNITY): Payer: No Payment, Other | Admitting: Licensed Clinical Social Worker

## 2023-09-26 DIAGNOSIS — F319 Bipolar disorder, unspecified: Secondary | ICD-10-CM

## 2023-09-26 DIAGNOSIS — F411 Generalized anxiety disorder: Secondary | ICD-10-CM

## 2023-09-26 NOTE — Progress Notes (Signed)
THERAPIST PROGRESS NOTE  Virtual Visit via Video Note  I connected with Karen [name] on 09/26/23 at  2:00 PM EST by a video enabled telemedicine application and verified that I am speaking with the correct person using two identifiers.  Location: Patient: [name]  Provider: Providers Home   I discussed the limitations of evaluation and management by telemedicine and the availability of in person appointments. The patient expressed understanding and agreed to proceed.  I discussed the assessment and treatment plan with the patient. The patient was provided an opportunity to ask questions and all were answered. The patient agreed with the plan and demonstrated an understanding of the instructions.   The patient was advised to call back or seek an in-person evaluation if the symptoms worsen or if the condition fails to improve as anticipated.  I provided 45 minutes of non-face-to-face time during this encounter.  [name], [name]   Participation Level: Active  Behavioral Response: CasualAlertAnxious and Depressed  Type of Therapy: Individual Therapy  Treatment Goals addressed:  Active     Bipolar Disorder CCP Problem  1 bipolar 1 disorder       walk 3 x weekly or workout  (Not Progressing)     Start:  03/07/22    Expected End:  01/31/24         maintain 40 hour a week employment  (Completed/Met)     Start:  03/07/22    Expected End:  10/04/22    Resolved:  08/08/22      LTG: Stabilize mood and increase goal-directed behavior: decrease PHQ-9 below 10  (Progressing)     Start:  03/07/22    Expected End:  01/31/24         STG: Karen [name] WILL COOPERATE WITH A PSYCHIATRIC EVALUATION ON 10/05/2020  AND DURING ALL FOLLOW-UP VISITS (Completed/Met)     Start:  03/07/22    Expected End:  10/04/22    Resolved:  03/26/22      STG: Karen [name] WILL IDENTIFY COGNITIVE PATTERNS AND BELIEFS THAT INTERFERE WITH THERAPY (Progressing)     Start:  03/07/22    Expected  End:  01/31/24          decrease GAD-7 below 5  (Not Progressing)     Start:  03/07/22    Expected End:  01/31/24            WORK WITH Karen [name] TO DISCUSS RISKS AND BENEFITS OF MEDICATION TREATMENT OPTIONS FOR THIS PROBLEM AND PRESCRIBE AS INDICATED (Completed)     Start:  03/07/22    End:  03/14/22      Conduct a review of all medications to evaluate any drug/drug interactions (Completed)     Start:  03/07/22    End:  03/14/22      REVIEW WITH Karen [name] THEIR RESPONSE TO THE PRESCRIBED MEDICATION, INCLUDING ANY SIDE EFFECTS (Completed)     Start:  03/07/22    End:  03/14/22      ENCOURAGE Karen [name] TO KEEP A LOG OF MEDICATION SIDE EFFECTS, QUESTIONS REGARDING MEDICATION, AND SYMPTOMS (Completed)     Start:  03/07/22    End:  03/26/22      INSTRUCT Karen [name] TO TAKE PSYCHOTROPIC MEDICATION AS PRESCRIBED (Completed)     Start:  03/07/22    End:  03/14/22      INSTRUCT Karen [name] TO COMMUNICATE EFFECTS OF PRESCRIBED MEDICATIONS (Completed)     Start:  03/07/22    End:  03/26/22      EDUCATE  Karen [name] ON BIPOLAR DISORDER DIAGNOSTIC CRITERIA (Completed)     Start:  03/07/22    End:  03/14/22      WORK WITH Karen [name] TO DEVELOP A LIST OF AT LEAST 1 CONSEQUENCES OF UNTREATED BIPOLAR SYMPTOMS (Completed)     Start:  03/07/22    End:  03/14/22         ProgressTowards Goals: Progressing  Interventions: CBT, Motivational Interviewing, and Supportive   Suicidal/Homicidal: Nowithout intent/plan  Therapist Response:    Was alert and oriented x 5.  She was pleasant, cooperative, maintained good eye contact.  She engaged well in therapy session was dressed casually.  She presented today with anxious mood\affect.  Patient reports primary stressors as her son.  Karen [name] reports her son Karen [name] is having behavioral issues at school with sharing.  [name] reports that this may be due to the fact that she is trying to interact with her boyfriend's son with Karen [name] has a plan on moving in  together.  Ailany reports that because they have not completely moved and yet Karen [name] does not currently have a space of his own.  Other stressors for patient are time management.   Patient reports tension and worry due to work, school, and moving into new housing within the next 6 months.  Patient reports "drama" at work with colleagues due to her being friends with one of the managers.  Patient reports "I do not make enough money at this place to deal with that kind of drama and reactions they want From me."   Intervention/Plan: [name] supportive therapy for praise and encouragement.  [name] educated patient on taking medications as prescribed.  [name] spoke with patient about the importance of summarization's at the end of therapy sessions and medication management.  [name] did this due to the fact that she stopped taking her medications that she was not supposed to stop taking. Patient reports increased anxiety due to stoppage.  Patient reports since starting back up on her medication regiment that she should have been on anxiety has become more manageable.    Plan: Return again in 2 weeks.  Diagnosis: Bipolar 1 disorder (HCC)  GAD (generalized anxiety disorder)  Collaboration of Care: Other None today   Patient/Guardian was advised Release of Information must be obtained prior to any record release in order to collaborate their care with an outside provider. Patient/Guardian was advised if they have not already done so to contact the registration department to sign all necessary forms in order for [place] to release information regarding their care.   Consent: Patient/Guardian gives verbal consent for treatment and assignment of benefits for services provided during this visit. Patient/Guardian expressed understanding and agreed to proceed.   [name], [name] 09/26/2023

## 2023-10-02 ENCOUNTER — Encounter (HOSPITAL_COMMUNITY): Payer: Self-pay | Admitting: Physician Assistant

## 2023-10-02 NOTE — Telephone Encounter (Signed)
Message acknowledged and reviewed. Provider to provide a work note for patient.

## 2023-10-07 ENCOUNTER — Ambulatory Visit (INDEPENDENT_AMBULATORY_CARE_PROVIDER_SITE_OTHER): Payer: No Payment, Other | Admitting: Licensed Clinical Social Worker

## 2023-10-07 DIAGNOSIS — F319 Bipolar disorder, unspecified: Secondary | ICD-10-CM | POA: Diagnosis not present

## 2023-10-07 NOTE — Progress Notes (Signed)
THERAPIST PROGRESS NOTE  Virtual Visit via Video Note  I connected with Karen [name] on 10/07/23 at  3:00 PM EST by a video enabled telemedicine application and verified that I am speaking with the correct person using two identifiers.  Location: Patient: [facility]  Provider: Provider Home    I discussed the limitations of evaluation and management by telemedicine and the availability of in person appointments. The patient expressed understanding and agreed to proceed.    I discussed the assessment and treatment plan with the patient. The patient was provided an opportunity to ask questions and all were answered. The patient agreed with the plan and demonstrated an understanding of the instructions.   The patient was advised to call back or seek an in-person evaluation if the symptoms worsen or if the condition fails to improve as anticipated.  I provided 50 minutes of non-face-to-face time during this encounter.   Karen Cooks, [name]   Participation Level: Active  Behavioral Response: CasualAlertAnxious and Depressed  Type of Therapy: Individual Therapy  Treatment Goals addressed:  Active     Bipolar Disorder CCP Problem  1 bipolar 1 disorder       walk 3 x weekly or workout  (Not Progressing)     Start:  03/07/22    Expected End:  01/31/24         maintain 40 hour a week employment  (Completed/Met)     Start:  03/07/22    Expected End:  10/04/22    Resolved:  08/08/22      LTG: Stabilize mood and increase goal-directed behavior: decrease PHQ-9 below 10  (Progressing)     Start:  03/07/22    Expected End:  01/31/24         STG: Karen [name] WILL COOPERATE WITH A PSYCHIATRIC EVALUATION ON 10/05/2020  AND DURING ALL FOLLOW-UP VISITS (Completed/Met)     Start:  03/07/22    Expected End:  10/04/22    Resolved:  03/26/22      STG: Karen [name] WILL IDENTIFY COGNITIVE PATTERNS AND BELIEFS THAT INTERFERE WITH THERAPY (Progressing)     Start:  03/07/22     Expected End:  01/31/24          decrease GAD-7 below 5  (Not Progressing)     Start:  03/07/22    Expected End:  01/31/24            WORK WITH Karen [name] TO DISCUSS RISKS AND BENEFITS OF MEDICATION TREATMENT OPTIONS FOR THIS PROBLEM AND PRESCRIBE AS INDICATED (Completed)     Start:  03/07/22    End:  03/14/22      Conduct a review of all medications to evaluate any drug/drug interactions (Completed)     Start:  03/07/22    End:  03/14/22      REVIEW WITH Karen [name] THEIR RESPONSE TO THE PRESCRIBED MEDICATION, INCLUDING ANY SIDE EFFECTS (Completed)     Start:  03/07/22    End:  03/14/22      ENCOURAGE Karen [name] TO KEEP A LOG OF MEDICATION SIDE EFFECTS, QUESTIONS REGARDING MEDICATION, AND SYMPTOMS (Completed)     Start:  03/07/22    End:  03/26/22      INSTRUCT Karen [name] TO TAKE PSYCHOTROPIC MEDICATION AS PRESCRIBED (Completed)     Start:  03/07/22    End:  03/14/22      INSTRUCT Karen [name] TO COMMUNICATE EFFECTS OF PRESCRIBED MEDICATIONS (Completed)     Start:  03/07/22    End:  03/26/22  EDUCATE Karen [name] ON BIPOLAR DISORDER DIAGNOSTIC CRITERIA (Completed)     Start:  03/07/22    End:  03/14/22      WORK WITH Karen [name] TO DEVELOP A LIST OF AT LEAST 1 CONSEQUENCES OF UNTREATED BIPOLAR SYMPTOMS (Completed)     Start:  03/07/22    End:  03/14/22         ProgressTowards Goals: Progressing  Interventions: CBT, Motivational Interviewing, and Supportive    Suicidal/Homicidal: Nowithout intent/plan  Therapist Response:     Karen [name] was alert and oriented x 5.  She was pleasant, cooperative, and maintained good eye contact.  She engaged well in therapy session and was dressed casually.  She presented today with anxious mood\affect.   Patient comes in today with primary stressors as work, school, and transition.  Patient reports that she continues to take part-time classes at [employer].  Patient reports that it has been difficult to balance work, family, and school life.   Patient reports that this was especially hard coming off of Thanksgiving break.   Other stressors for patient include transition.  Patient reports that her and her son are going to move in with her boyfriend and his son.  Patient reports that their primary goal is to make sure that the transition is as smooth as possible for their children.  [name] and patient spoke today about tools to expose children for proper transitioning.  This included speaking about team building exercises for the kids, behavioral modification charge for positive reinforcement, and having open and honest conversations with the children about expectations.  Karen [name] reports another stressor for work.  Patient reports that the maturity level at her work is not what she would like it to be.  Patient reports that there are a lot of rumors going around and "pettiness".   Interventions/plan: [name] utilized supportive therapy for praise and encouragement.  [name] used person centered therapy for empowerment and unconditional positive regard utilizing nonjudgmental stance.  [name] utilized CBT for reframing and restructuring.  [name] utilized psycho analytic therapy for patient to express thoughts, feelings and concerns in session.    Plan: Return again in 3 weeks.  Diagnosis: Bipolar 1 disorder (HCC)  Collaboration of Care: Other None today   Patient/Guardian was advised Release of Information must be obtained prior to any record release in order to collaborate their care with an outside provider. Patient/Guardian was advised if they have not already done so to contact the registration department to sign all necessary forms in order for [place] to release information regarding their care.   Consent: Patient/Guardian gives verbal consent for treatment and assignment of benefits for services provided during this visit. Patient/Guardian expressed understanding and agreed to proceed.   Karen Cooks, [name] 10/07/2023

## 2023-10-09 ENCOUNTER — Encounter (HOSPITAL_COMMUNITY): Payer: Self-pay | Admitting: Physician Assistant

## 2023-10-09 ENCOUNTER — Telehealth (HOSPITAL_COMMUNITY): Payer: No Payment, Other | Admitting: Physician Assistant

## 2023-10-09 DIAGNOSIS — F319 Bipolar disorder, unspecified: Secondary | ICD-10-CM | POA: Diagnosis not present

## 2023-10-09 DIAGNOSIS — F5105 Insomnia due to other mental disorder: Secondary | ICD-10-CM

## 2023-10-09 DIAGNOSIS — F4312 Post-traumatic stress disorder, chronic: Secondary | ICD-10-CM

## 2023-10-09 DIAGNOSIS — F99 Mental disorder, not otherwise specified: Secondary | ICD-10-CM

## 2023-10-09 DIAGNOSIS — F419 Anxiety disorder, unspecified: Secondary | ICD-10-CM

## 2023-10-09 MED ORDER — RAMELTEON 8 MG PO TABS
8.0000 mg | ORAL_TABLET | Freq: Every day | ORAL | 1 refills | Status: DC
Start: 2023-10-09 — End: 2023-11-24

## 2023-10-09 MED ORDER — ARIPIPRAZOLE 20 MG PO TABS
20.0000 mg | ORAL_TABLET | Freq: Every day | ORAL | 1 refills | Status: DC
Start: 2023-10-09 — End: 2023-11-24

## 2023-10-09 MED ORDER — GABAPENTIN 300 MG PO CAPS
600.0000 mg | ORAL_CAPSULE | Freq: Three times a day (TID) | ORAL | 1 refills | Status: DC
Start: 2023-10-09 — End: 2023-11-24

## 2023-10-09 MED ORDER — ESCITALOPRAM OXALATE 10 MG PO TABS
10.0000 mg | ORAL_TABLET | Freq: Every day | ORAL | 1 refills | Status: DC
Start: 2023-10-09 — End: 2023-11-24

## 2023-10-09 MED ORDER — LAMOTRIGINE 100 MG PO TABS: 100.0000 mg | ORAL_TABLET | Freq: Two times a day (BID) | ORAL | 1 refills | Status: DC

## 2023-10-09 NOTE — Progress Notes (Unsigned)
BH MD/PA/NP OP Progress Note  Virtual Visit via Video Note  I connected with Karen [name] on 10/09/23 at  4:30 PM EST by a video enabled telemedicine application and verified that I am speaking with the correct person using two identifiers.  Location: Patient: Home Provider: Clinic   I discussed the limitations of evaluation and management by telemedicine and the availability of in person appointments. The patient expressed understanding and agreed to proceed.  Follow Up Instructions:  I discussed the assessment and treatment plan with the patient. The patient was provided an opportunity to ask questions and all were answered. The patient agreed with the plan and demonstrated an understanding of the instructions.   The patient was advised to call back or seek an in-person evaluation if the symptoms worsen or if the condition fails to improve as anticipated.  I provided 12 minutes of non-face-to-face time during this encounter.  Meta Hatchet, PA    10/09/2023 9:23 PM Karen [name]  MRN:  [id number]  Chief Complaint:  Chief Complaint  Patient presents with   Follow-up   Medication Refill   HPI:   Karen [name] is a 32 year old, Caucasian female with a past psychiatric history significant for chronic anxiety, bipolar 1 disorder, and chronic PTSD who presents to [facility] via virtual video visit for follow-up and medication management.  Patient is currently being managed on the following psychiatric medications:  Ramelteon 8 mg at bedtime Gabapentin 600 mg 3 times daily Lamotrigine 100 mg 2 times daily Abilify 20 mg daily Clonazepam 0.5 mg daily as needed Escitalopram 10 mg daily  Patient presents to the encounter stating that she is doing better.  She reports that she is in the process of moving in with her boyfriend which allows her to be more financially stable.  She reports that her job was given her a difficult  time so she recently put in her 2 weeks notice.  She reports that her anxiety has been manageable since starting Lexapro and reports that she has not needed to use her as needed medications as frequently.  Patient reports that she still has stressors that she is dealing with but states that her anxiety has gone down.  Patient rates her anxiety a 5 out of 10.  Patient denies depressive episodes at this time.  Patient's main stressor revolves around moving.  A GAD-7 screen was performed with the patient scoring a 10.  Patient is alert and oriented x 4, calm, cooperative, and fully engaged in conversation during the encounter.  Patient endorses content mood.  Patient denies suicidal or homicidal ideations.  She further denies auditory or visual hallucinations and does not appear to be responding to internal/external stimuli.  Patient endorses good sleep and receives on average 8 hours of sleep per night.  Patient endorses good appetite and eats on average 2 meals per day.  Patient denies alcohol consumption, tobacco use, or illicit drug use.  Visit Diagnosis:    ICD-10-CM   1. Insomnia due to other mental disorder  F51.05 ramelteon (ROZEREM) 8 MG tablet   F99     2. Chronic anxiety  F41.9 gabapentin (NEURONTIN) 300 MG capsule    escitalopram (LEXAPRO) 10 MG tablet    3. Bipolar 1 disorder (HCC)  F31.9 ARIPiprazole (ABILIFY) 20 MG tablet    lamoTRIgine (LAMICTAL) 100 MG tablet      Past Psychiatric History:  Bipolar disorder Generalized anxiety disorder Visual hallucinations Chronic PTSD  Past  Medical History:  Past Medical History:  Diagnosis Date   Anxiety    Bipolar disorder (HCC)    Chronic UTI    Depression    Endometriosis    HSV (herpes simplex virus) anogenital infection    PTSD (post-traumatic stress disorder)     Past Surgical History:  Procedure Laterality Date   CESAREAN SECTION N/A 02/17/2018   Procedure: CESAREAN SECTION;  Surgeon: Essie Hart, MD;  Location: [facility]  BIRTHING SUITES;  Service: Obstetrics;  Laterality: N/A;   COLONOSCOPY     DIAGNOSTIC LAPAROSCOPY  11/05/2012   DILATION AND EVACUATION N/A 03/11/2015   Procedure: DILATATION AND EVACUATION;  Surgeon: Hoover Browns, MD;  Location: [facility];  Service: Gynecology;  Laterality: N/A;   DRUG INDUCED ENDOSCOPY     LAPAROSCOPY     LAPAROSCOPY N/A 06/07/2021   Procedure: LAPAROSCOPY OPERATIVE with /EXCISION OF LESIONS and lysis of adhesions;  Surgeon: Hoover Browns, MD;  Location: [facility] OR;  Service: Gynecology;  Laterality: N/A;   TONSILLECTOMY  11/05/2013    Family Psychiatric History:  Mother - Major Depressive Disorder Sister - Obsessive Compulsive Disorder Aunt (maternal) - Bipolar I Disorder Uncle (maternal) - Bipolar I Disorder  Family History:  Family History  Problem Relation Age of Onset   Rheum arthritis Mother    Cancer Father     Social History:  Social History   Socioeconomic History   Marital status: Married    Spouse name: Kahley Pinkerton   Number of children: 1   Years of education: Not on file   Highest education level: Some college, no degree  Occupational History   Not on file  Tobacco Use   Smoking status: Every Day    Types: E-cigarettes   Smokeless tobacco: Never   Tobacco comments:    PT VAPES DAILY, '' I vape a lot ''   Vaping Use   Vaping status: Every Day   Substances: Nicotine, Flavoring  Substance and Sexual Activity   Alcohol use: Not Currently   Drug use: Not Currently    Types: Heroin, Cocaine, Marijuana    Comment: Clean since Aug 2015   Sexual activity: Yes    Partners: Male  Other Topics Concern   Not on file  Social History Narrative   Not on file   Social Determinants of Health   Financial Resource Strain: Medium Risk (03/07/2022)   Overall Financial Resource Strain (CARDIA)    Difficulty of Paying Living Expenses: Somewhat hard  Food Insecurity: No Food Insecurity (03/07/2022)   Hunger Vital Sign    Worried About Running Out of Food in the Last  Year: Never true    Ran Out of Food in the Last Year: Never true  Transportation Needs: No Transportation Needs (03/07/2022)   PRAPARE - Administrator, Civil Service (Medical): No    Lack of Transportation (Non-Medical): No  Physical Activity: Sufficiently Active (03/07/2022)   Exercise Vital Sign    Days of Exercise per Week: 3 days    Minutes of Exercise per Session: 60 min  Stress: Stress Concern Present (03/07/2022)   [employer] of Occupational Health - Occupational Stress Questionnaire    Feeling of Stress : Rather much  Social Connections: Moderately Isolated (03/07/2022)   Social Connection and Isolation Panel [NHANES]    Frequency of Communication with Friends and Family: More than three times a week    Frequency of Social Gatherings with Friends and Family: More than three times a week    Attends  Religious Services: Never    Active Member of Clubs or Organizations: Yes    Attends Banker Meetings: More than 4 times per year    Marital Status: Separated    Allergies:  Allergies  Allergen Reactions   Ciprofloxacin Hives   Wound Dressing Adhesive Rash    Metabolic Disorder Labs: Lab Results  Component Value Date   HGBA1C 4.6 (L) 07/02/2022   MPG 85.32 07/02/2022   MPG 85.32 09/11/2020   No results found for: "PROLACTIN" Lab Results  Component Value Date   CHOL 117 07/02/2022   TRIG 94 07/02/2022   HDL 29 (L) 07/02/2022   CHOLHDL 4.0 07/02/2022   VLDL 19 07/02/2022   LDLCALC 69 07/02/2022   LDLCALC 78 05/09/2021   Lab Results  Component Value Date   TSH 0.519 07/02/2022   TSH 0.784 09/11/2020    Therapeutic Level Labs: Lab Results  Component Value Date   LITHIUM 0.27 (L) 07/06/2022   No results found for: "VALPROATE" No results found for: "CBMZ"  Current Medications: Current Outpatient Medications  Medication Sig Dispense Refill   acetaminophen (TYLENOL) 325 MG tablet Take 2 tablets (650 mg total) by mouth every 6 (six)  hours as needed for mild pain or moderate pain. 30 tablet 1   ARIPiprazole (ABILIFY) 20 MG tablet Take 1 tablet (20 mg total) by mouth daily. 30 tablet 1   clonazePAM (KLONOPIN) 0.5 MG tablet Take 1 tablet (0.5 mg total) by mouth daily as needed for anxiety. 30 tablet 0   escitalopram (LEXAPRO) 10 MG tablet Take 1 tablet (10 mg total) by mouth daily. 30 tablet 1   gabapentin (NEURONTIN) 300 MG capsule Take 2 capsules (600 mg total) by mouth 3 (three) times daily. 180 capsule 1   hydrOXYzine (ATARAX) 25 MG tablet Take 1 tablet (25 mg total) by mouth 3 (three) times daily as needed for anxiety. 75 tablet 1   lamoTRIgine (LAMICTAL) 100 MG tablet Take 1 tablet (100 mg total) by mouth 2 (two) times daily. 60 tablet 1   medroxyPROGESTERone (DEPO-PROVERA) 150 MG/ML injection Inject 150 mg into the muscle every 3 (three) months. Next dose due 07/03/22     Multiple Vitamins-Minerals (MULTIVITAMIN WITH MINERALS) tablet Take 1 tablet by mouth daily.     promethazine (PHENERGAN) 25 MG tablet Take 1 tablet (25 mg total) by mouth every 6 (six) hours as needed for nausea or vomiting. 20 tablet 0   ramelteon (ROZEREM) 8 MG tablet Take 1 tablet (8 mg total) by mouth at bedtime. 30 tablet 1   No current facility-administered medications for this visit.     Musculoskeletal: Strength & Muscle Tone: within normal limits Gait & Station: normal Patient leans: N/A  Psychiatric Specialty Exam: Review of Systems  Psychiatric/Behavioral:  Negative for decreased concentration, dysphoric mood, hallucinations, self-injury, sleep disturbance and suicidal ideas. The patient is nervous/anxious. The patient is not hyperactive.     There were no vitals taken for this visit.There is no height or weight on file to calculate BMI.  General Appearance: Casual  Eye Contact:  Good  Speech:  Clear and Coherent and Normal Rate  Volume:  Normal  Mood:  Euthymic  Affect:  Appropriate  Thought Process:  Coherent, Goal Directed, and  Descriptions of Associations: Intact  Orientation:  Full (Time, Place, and Person)  Thought Content: WDL   Suicidal Thoughts:  No  Homicidal Thoughts:  No  Memory:  Immediate;   Good Recent;   Good Remote;   Good  Judgement:  Good  Insight:  Good  Psychomotor Activity:  Normal  Concentration:  Concentration: Good and Attention Span: Good  Recall:  Good  Fund of Knowledge: Good  Language: Good  Akathisia:  No  Handed:  Ambidextrous  AIMS (if indicated): not done  Assets:  Communication Skills Desire for Improvement Housing Social Support Vocational/Educational  ADL's:  Intact  Cognition: WNL  Sleep:  Good   Screenings: AIMS    Flowsheet Row Ad[facility] (Discharged) [facility] Ad[facility] (Discharged[facility] from [facility] Admission (Discharged) from [facility]  AIMS Total Score 0 0 0      AUDIT    Flowsheet Row Admission (Discharged) [facility] Admission (Discharged[facility] from [facility] Admission (Discharged) from [facility]  Alcohol Use Disorder Identification Test Final Score (AUDIT) 0 0 0      GAD-7    Flowsheet Row Video Visit from 10/09/2023 in [facility] Video Visit from 08/21/2023 in [facility] Video Visit from 07[facility]10/2023 in [facility] Video Visit from 02/06/2023 in [facility] Visit from 12/26/2022 in [facility]  Total GAD-7 Score 10 14 18 19 18       PHQ2-9    Flowsheet Row Video Visit from 10/09/2023 in [facility] Video  Visit from 08/21/2023 in [facility] Video Visit from 07/10/2023 in [facility] Video Visit from 02/06/2023 in [facility] Video Visit from 12/26/2022 in [facility]  PHQ-2 Total Score 1 0 1 2 6   PHQ-9 Total Score -- -- -- 6 16      Flowsheet Row Video Visit from 10/09/2023 in [facility] Video Visit from 08/21/2023 in [facility] ED from 07/12/2023 in [facility]  C-SSRS RISK CATEGORY Moderate Risk High Risk No Risk        Assessment and Plan:   Karen [name] is a 32 year old, Caucasian female with a past psychiatric history significant for chronic anxiety, bipolar 1 disorder, and chronic PTSD who presents to [facility] via virtual video visit for follow-up and medication management.  Patient presents to the encounter stating that she has been doing much better since the last encounter.  She reports that she is moving in with her boyfriend which will allow her to be more financially stable.  She endorses that her main stressor revolves around moving but states that her anxiety has lowered since being placed on Lexapro.  Patient denies overt depressive symptoms at this time.  Patient would like to continue taking her medications as prescribed.  Patient's medications to be e- her prescribed through pharmacy of choice.  Collaboration of Care: Collaboration of Care: Medication Management AEB provider managing patient's psychiatric medications, Psychiatrist AEB patient being followed by mental health provider at this facility, and Referral or follow-up with counselor/therapist AEB patient being seen by a licensed clinical social worker at this facility  Patient/Guardian was advised Release of Information must be obtained prior to any record release in  order  to collaborate their care with an outside provider. Patient/Guardian was advised if they have not already done so to contact the registration department to sign all necessary forms in order for [place] to release information regarding their care.   Consent: Patient/Guardian gives verbal consent for treatment and assignment of benefits for services provided during this visit. Patient/Guardian expressed understanding and agreed to proceed.   1. Insomnia due to other mental disorder  - ramelteon (ROZEREM) 8 MG tablet; Take 1 tablet (8 mg total) by mouth at bedtime.  Dispense: 30 tablet; Refill: 1  2. Chronic anxiety  - gabapentin (NEURONTIN) 300 MG capsule; Take 2 capsules (600 mg total) by mouth 3 (three) times daily.  Dispense: 180 capsule; Refill: 1 - escitalopram (LEXAPRO) 10 MG tablet; Take 1 tablet (10 mg total) by mouth daily.  Dispense: 30 tablet; Refill: 1  3. Bipolar 1 disorder (HCC)  - ARIPiprazole (ABILIFY) 20 MG tablet; Take 1 tablet (20 mg total) by mouth daily.  Dispense: 30 tablet; Refill: 1 - lamoTRIgine (LAMICTAL) 100 MG tablet; Take 1 tablet (100 mg total) by mouth 2 (two) times daily.  Dispense: 60 tablet; Refill: 1  4. Chronic post-traumatic stress disorder (PTSD)  Patient to follow up in 6 weeks Provider spent a total of 11 minute with the patient/reviewing the patient's chart  Meta Hatchet, PA 10/09/2023, 9:23 PM

## 2023-10-21 ENCOUNTER — Ambulatory Visit (HOSPITAL_COMMUNITY): Payer: No Payment, Other | Admitting: Licensed Clinical Social Worker

## 2023-10-21 ENCOUNTER — Ambulatory Visit (INDEPENDENT_AMBULATORY_CARE_PROVIDER_SITE_OTHER): Payer: No Payment, Other | Admitting: Licensed Clinical Social Worker

## 2023-10-21 DIAGNOSIS — F319 Bipolar disorder, unspecified: Secondary | ICD-10-CM

## 2023-10-21 NOTE — Progress Notes (Signed)
THERAPIST PROGRESS NOTE  Virtual Visit via Video Note  I connected with Carlye Komar on 10/21/23 at  2:00 PM EST by a video enabled telemedicine application and verified that I am speaking with the correct person using two identifiers.  Location: Patient: [facility]  Provider: Providers Home    I discussed the limitations of evaluation and management by telemedicine and the availability of in person appointments. The patient expressed understanding and agreed to proceed.   I discussed the assessment and treatment plan with the patient. The patient was provided an opportunity to ask questions and all were answered. The patient agreed with the plan and demonstrated an understanding of the instructions.   The patient was advised to call back or seek an in-person evaluation if the symptoms worsen or if the condition fails to improve as anticipated.  I provided 55 minutes of non-face-to-face time during this encounter.   [name], [name]   Participation Level: Active  Behavioral Response: CasualAlertAnxious and Depressed  Type of Therapy: Individual Therapy  Treatment Goals addressed:  Active     Bipolar Disorder CCP Problem  1 bipolar 1 disorder       walk 3 x weekly or workout  (Progressing)     Start:  03/07/22    Expected End:  01/31/24         maintain 40 hour a week employment  (Completed/Met)     Start:  03/07/22    Expected End:  10/04/22    Resolved:  08/08/22      LTG: Stabilize mood and increase goal-directed behavior: decrease PHQ-9 below 10  (Progressing)     Start:  03/07/22    Expected End:  01/31/24         STG: [name] WILL COOPERATE WITH A PSYCHIATRIC EVALUATION ON 10/05/2020  AND DURING ALL FOLLOW-UP VISITS (Completed/Met)     Start:  03/07/22    Expected End:  10/04/22    Resolved:  03/26/22      STG: [name] WILL IDENTIFY COGNITIVE PATTERNS AND BELIEFS THAT INTERFERE WITH THERAPY (Progressing)     Start:  03/07/22    Expected  End:  01/31/24          decrease GAD-7 below 5  (Not Progressing)     Start:  03/07/22    Expected End:  01/31/24            WORK WITH [name] TO DISCUSS RISKS AND BENEFITS OF MEDICATION TREATMENT OPTIONS FOR THIS PROBLEM AND PRESCRIBE AS INDICATED (Completed)     Start:  03/07/22    End:  03/14/22      Conduct a review of all medications to evaluate any drug/drug interactions (Completed)     Start:  03/07/22    End:  03/14/22      REVIEW WITH [name] THEIR RESPONSE TO THE PRESCRIBED MEDICATION, INCLUDING ANY SIDE EFFECTS (Completed)     Start:  03/07/22    End:  03/14/22      ENCOURAGE Schylar TO KEEP A LOG OF MEDICATION SIDE EFFECTS, QUESTIONS REGARDING MEDICATION, AND SYMPTOMS (Completed)     Start:  03/07/22    End:  03/26/22      INSTRUCT Ersilia TO TAKE PSYCHOTROPIC MEDICATION AS PRESCRIBED (Completed)     Start:  03/07/22    End:  03/14/22      INSTRUCT Keely TO COMMUNICATE EFFECTS OF PRESCRIBED MEDICATIONS (Completed)     Start:  03/07/22    End:  03/26/22  EDUCATE Aiya ON BIPOLAR DISORDER DIAGNOSTIC CRITERIA (Completed)     Start:  03/07/22    End:  03/14/22      WORK WITH [name] TO DEVELOP A LIST OF AT LEAST 1 CONSEQUENCES OF UNTREATED BIPOLAR SYMPTOMS (Completed)     Start:  03/07/22    End:  03/14/22         ProgressTowards Goals: Progressing  Interventions: CBT, Motivational Interviewing, and Supportive   Suicidal/Homicidal: Nowithout intent/plan  Therapist Response:     Pt was alert and oriented x 5.  She was pleasant, cooperative, maintained good eye contact.  She engaged well in therapy session was dressed casually.  Makaylynn presented today with euthymic mood\affect.   Patient comes in today with primary stressors for work and transition for moving.  She reports that she had to recently quit her job due to the drama at her work.  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  Patient reports that she quit and gave her reasons for quitting but did not give a 2-week notice.  Other stressors for patient are transition from moving.  Patient reports that she is moving in with her significant other to [facility] [place].  She reports that she will still keep her residence in [place] as a backup.  Patient reports that she is excited about the move but transitioning the kids and marching in the household with her son and her significant other son has some challenges.  Zykerria reports that the house needs some work as well which is going to be getting done in the next week or 2.  Patient reports that she is gradually moving her stuff over and this has helped decrease her anxiety.  Intervention/plan: [name] supportive therapy for praise and encouragement.  [name] used person centered therapy for empowerment and unconditional positive regard utilizing nonjudgmental stance.  [name] educated patient on partilization and breaking things down to help decrease anxiety.  [name] used motivational interviewing for open-ended questions, reflective listening, and positive affirmations. [name] educated on the Benefits of exercise ti help decrease anxiety and depression   Plan: Return again in 3 weeks.  Diagnosis: Bipolar 1 disorder (HCC)  Collaboration of Care: Other None today   Patient/Guardian was advised Release of Information must be obtained prior to any record release in order to collaborate their care with an outside provider. Patient/Guardian was advised if they have not already done so to contact the registration department to sign all necessary forms in order for [place] to release information regarding their care.   Consent: Patient/Guardian gives verbal consent for treatment and assignment of benefits for services provided during this visit. Patient/Guardian  expressed understanding and agreed to proceed.   [name], [name] 10/21/2023

## 2023-11-18 ENCOUNTER — Ambulatory Visit (HOSPITAL_COMMUNITY): Payer: No Payment, Other | Admitting: Licensed Clinical Social Worker

## 2023-11-22 ENCOUNTER — Telehealth (INDEPENDENT_AMBULATORY_CARE_PROVIDER_SITE_OTHER): Payer: No Payment, Other | Admitting: Physician Assistant

## 2023-11-22 DIAGNOSIS — F319 Bipolar disorder, unspecified: Secondary | ICD-10-CM | POA: Diagnosis not present

## 2023-11-22 DIAGNOSIS — F419 Anxiety disorder, unspecified: Secondary | ICD-10-CM

## 2023-11-22 DIAGNOSIS — F99 Mental disorder, not otherwise specified: Secondary | ICD-10-CM

## 2023-11-22 DIAGNOSIS — F5105 Insomnia due to other mental disorder: Secondary | ICD-10-CM

## 2023-11-24 ENCOUNTER — Encounter (HOSPITAL_COMMUNITY): Payer: Self-pay | Admitting: Physician Assistant

## 2023-11-24 MED ORDER — ESCITALOPRAM OXALATE 10 MG PO TABS
10.0000 mg | ORAL_TABLET | Freq: Every day | ORAL | 2 refills | Status: DC
Start: 2023-11-24 — End: 2024-01-17

## 2023-11-24 MED ORDER — GABAPENTIN 300 MG PO CAPS
600.0000 mg | ORAL_CAPSULE | Freq: Three times a day (TID) | ORAL | 2 refills | Status: DC
Start: 2023-11-24 — End: 2024-01-17

## 2023-11-24 MED ORDER — RAMELTEON 8 MG PO TABS
8.0000 mg | ORAL_TABLET | Freq: Every day | ORAL | 2 refills | Status: DC
Start: 2023-11-24 — End: 2024-01-17

## 2023-11-24 MED ORDER — ARIPIPRAZOLE 20 MG PO TABS: 20.0000 mg | ORAL_TABLET | Freq: Every day | ORAL | 2 refills | Status: DC

## 2023-11-24 NOTE — Progress Notes (Signed)
BH MD/PA/NP OP Progress Note  Virtual Visit via Video Note  I connected with Karen [name] on 11/22/23 at  4:30 PM EST by a video enabled telemedicine application and verified that I am speaking with the correct person using two identifiers.  Location: Patient: Home Provider: Clinic   I discussed the limitations of evaluation and management by telemedicine and the availability of in person appointments. The patient expressed understanding and agreed to proceed.  Follow Up Instructions:  I discussed the assessment and treatment plan with the patient. The patient was provided an opportunity to ask questions and all were answered. The patient agreed with the plan and demonstrated an understanding of the instructions.   The patient was advised to call back or seek an in-person evaluation if the symptoms worsen or if the condition fails to improve as anticipated.  I provided 9 minutes of non-face-to-face time during this encounter.  Meta Hatchet, PA    11/22/2023 8:16 PM Karen [name]  MRN:  [id number]  Chief Complaint:  Chief Complaint  Patient presents with   Follow-up   Medication Refill   HPI:   Karen [name] is a 33 year old, Caucasian female with a past psychiatric history significant for chronic anxiety, bipolar 1 disorder, and chronic PTSD who presents to [facility] via virtual video visit for follow-up and medication management.  Patient is currently being managed on the following psychiatric medications:  Ramelteon 8 mg at bedtime Gabapentin 600 mg 3 times daily Lamotrigine 100 mg 2 times daily Abilify 20 mg daily Escitalopram 10 mg daily  Patient presents to the encounter stating that she has been taking her medications regularly.  Patient reports no issues or concerns regarding her current medication regimen.  Patient reports that her Lexapro has been helpful in managing her anxiety.  Patient informed provider  that she is currently being seen by endocrinology due to her thyroid.  She reports that her endocrinologist stated that her anxiety may be attributed to thyroid issues.  She reports that she recently had a TSH level ran which was found to be decreased.  She reports that her T3 and T4 levels were within normal range.  She reports that she will be seen by [facility] on the 21st and 23rd of this month for further assessment of her thyroid.  Patient denies overt depressive symptoms at this time.  In regards to her anxiety, she reports that some days are better than others, but overall, her anxiety is much more manageable.  Patient denies instances of mania at this time.  Patient denies any new stressors at this time.  A GAD-7 screen was performed with the patient scoring a 9.  Patient is alert and oriented x 4, calm, cooperative, and fully engaged in conversation during the encounter.  Patient endorses stable mood.  Patient exhibits euthymic mood with congruent affect.  Patient denies suicidal or homicidal ideations.  She further denies auditory or visual hallucinations and does not appear to be responding to internal/external stimuli.  Patient endorses good sleep and receives on average 9 hours of sleep per night.  Patient endorses good appetite and eats on average 3 meals per day.  Patient denies alcohol consumption or illicit drug use.  Patient denies tobacco use but does engage in vaping.  Visit Diagnosis:    ICD-10-CM   1. Insomnia due to other mental disorder  F51.05 ramelteon (ROZEREM) 8 MG tablet   F99     2. Chronic anxiety  F41.9 gabapentin (NEURONTIN) 300 MG capsule    escitalopram (LEXAPRO) 10 MG tablet    3. Bipolar 1 disorder (HCC)  F31.9 ARIPiprazole (ABILIFY) 20 MG tablet       Past Psychiatric History:  Bipolar disorder Generalized anxiety disorder Visual hallucinations Chronic PTSD  Past Medical History:  Past Medical History:  Diagnosis Date   Anxiety    Bipolar disorder (HCC)     Chronic UTI    Depression    Endometriosis    HSV (herpes simplex virus) anogenital infection    PTSD (post-traumatic stress disorder)     Past Surgical History:  Procedure Laterality Date   CESAREAN SECTION N/A 02/17/2018   Procedure: CESAREAN SECTION;  Surgeon: Essie Hart, MD;  Location: [facility]ING [facility];  Service: Obstetrics;  Laterality: N/A;   COLONOSCOPY     DIAGNOSTIC LAPAROSCOPY  11/05/2012   DILATION AND EVACUATION N/A 03/11/2015   Procedure: DILATATION AND EVACUATION;  Surgeon: Hoover Browns, MD;  Location: [facility] ORS;  Service: Gynecology;  Laterality: N/A;   DRUG INDUCED ENDOSCOPY     LAPAROSCOPY     LAPAROSCOPY N/A 06/07/2021   Procedure: LAPAROSCOPY OPERATIVE with /EXCISION OF LESIONS and lysis of adhesions;  Surgeon: Hoover Browns, MD;  Location: [facility] OR;  Service: Gynecology;  Laterality: N/A;   TONSILLECTOMY  11/05/2013    Family Psychiatric History:  Mother - Major Depressive Disorder Sister - Obsessive Compulsive Disorder Aunt (maternal) - Bipolar I Disorder Uncle (maternal) - Bipolar I Disorder  Family History:  Family History  Problem Relation Age of Onset   Rheum arthritis Mother    Cancer Father     Social History:  Social History   Socioeconomic History   Marital status: Married    Spouse name: Franci Melnyk   Number of children: 1   Years of education: Not on file   Highest education level: Some college, no degree  Occupational History   Not on file  Tobacco Use   Smoking status: Every Day    Types: E-cigarettes   Smokeless tobacco: Never   Tobacco comments:    PT VAPES DAILY, '' I vape a lot ''   Vaping Use   Vaping status: Every Day   Substances: Nicotine, Flavoring  Substance and Sexual Activity   Alcohol use: Not Currently   Drug use: Not Currently    Types: Heroin, Cocaine, Marijuana    Comment: Clean since Aug 2015   Sexual activity: Yes    Partners: Male  Other Topics Concern   Not on file  Social History Narrative   Not on file    Social Drivers of Health   Financial Resource Strain: Low Risk  (11/08/2023)   Received from [facility]   Overall Financial Resource Strain (CARDIA)    Difficulty of Paying Living Expenses: Not hard at all  Food Insecurity: No Food Insecurity (11/08/2023)   Received from [facility]   Hunger Vital Sign    Worried About Running Out of Food in the Last Year: Never true    Ran Out of Food in the Last Year: Never true  Transportation Needs: No Transportation Needs (11/08/2023)   Received from [facility] - Transportation    Lack of Transportation (Medical): No    Lack of Transportation (Non-Medical): No  Physical Activity: Insufficiently Active (11/08/2023)   Received from [facility]   Exercise Vital Sign    Days of Exercise per Week: 1 day    Minutes of Exercise per Session: 60 min  Stress: Stress Concern Present (11/08/2023)   Received from [facility] - Occupational Stress Questionnaire    Feeling of Stress : To some extent  Social Connections: Socially Integrated (11/08/2023)   Received from [facility]   Social Network    How would you rate your social network (family, work, friends)?: Good participation with social networks    Allergies:  Allergies  Allergen Reactions   Ciprofloxacin Hives   Wound Dressing Adhesive Rash    Metabolic Disorder Labs: Lab Results  Component Value Date   HGBA1C 4.6 (L) 07/02/2022   MPG 85.32 07/02/2022   MPG 85.32 09/11/2020   No results found for: "PROLACTIN" Lab Results  Component Value Date   CHOL 117 07/02/2022   TRIG 94 07/02/2022   HDL 29 (L) 07/02/2022   CHOLHDL 4.0 07/02/2022   VLDL 19 07/02/2022   LDLCALC 69 07/02/2022   LDLCALC 78 05/09/2021   Lab Results  Component Value Date   TSH 0.519 07/02/2022   TSH 0.784 09/11/2020    Therapeutic Level Labs: Lab Results  Component Value Date   LITHIUM 0.27 (L) 07/06/2022   No results found for: "VALPROATE" No  results found for: "CBMZ"  Current Medications: Current Outpatient Medications  Medication Sig Dispense Refill   acetaminophen (TYLENOL) 325 MG tablet Take 2 tablets (650 mg total) by mouth every 6 (six) hours as needed for mild pain or moderate pain. 30 tablet 1   ARIPiprazole (ABILIFY) 20 MG tablet Take 1 tablet (20 mg total) by mouth daily. 30 tablet 2   clonazePAM (KLONOPIN) 0.5 MG tablet Take 1 tablet (0.5 mg total) by mouth daily as needed for anxiety. 30 tablet 0   escitalopram (LEXAPRO) 10 MG tablet Take 1 tablet (10 mg total) by mouth daily. 30 tablet 2   gabapentin (NEURONTIN) 300 MG capsule Take 2 capsules (600 mg total) by mouth 3 (three) times daily. 180 capsule 2   hydrOXYzine (ATARAX) 25 MG tablet Take 1 tablet (25 mg total) by mouth 3 (three) times daily as needed for anxiety. 75 tablet 1   lamoTRIgine (LAMICTAL) 100 MG tablet Take 1 tablet (100 mg total) by mouth 2 (two) times daily. 60 tablet 1   medroxyPROGESTERone (DEPO-PROVERA) 150 MG/ML injection Inject 150 mg into the muscle every 3 (three) months. Next dose due 07/03/22     Multiple Vitamins-Minerals (MULTIVITAMIN WITH MINERALS) tablet Take 1 tablet by mouth daily.     promethazine (PHENERGAN) 25 MG tablet Take 1 tablet (25 mg total) by mouth every 6 (six) hours as needed for nausea or vomiting. 20 tablet 0   ramelteon (ROZEREM) 8 MG tablet Take 1 tablet (8 mg total) by mouth at bedtime. 30 tablet 2   No current facility-administered medications for this visit.     Musculoskeletal: Strength & Muscle Tone: within normal limits Gait & Station: normal Patient leans: N/A  Psychiatric Specialty Exam: Review of Systems  Psychiatric/Behavioral:  Negative for decreased concentration, dysphoric mood, hallucinations, self-injury, sleep disturbance and suicidal ideas. The patient is nervous/anxious. The patient is not hyperactive.     There were no vitals taken for this visit.There is no height or weight on file to calculate  BMI.  General Appearance: Casual  Eye Contact:  Good  Speech:  Clear and Coherent and Normal Rate  Volume:  Normal  Mood:  Euthymic  Affect:  Appropriate  Thought Process:  Coherent, Goal Directed, and Descriptions of Associations: Intact  Orientation:  Full (Time,  Place, and Person)  Thought Content: WDL   Suicidal Thoughts:  No  Homicidal Thoughts:  No  Memory:  Immediate;   Good Recent;   Good Remote;   Good  Judgement:  Good  Insight:  Good  Psychomotor Activity:  Normal  Concentration:  Concentration: Good and Attention Span: Good  Recall:  Good  Fund of Knowledge: Good  Language: Good  Akathisia:  No  Handed:  Ambidextrous  AIMS (if indicated): not done  Assets:  Communication Skills Desire for Improvement Housing Social Support Vocational/Educational  ADL's:  Intact  Cognition: WNL  Sleep:  Good   Screenings: AIMS    Flowsheet Row Ad[facility] (Discharged) [facility] Ad[facility] (Discharged[facility] from [facility] Admission (Discharged) from [facility]  AIMS Total Score 0 0 0      AUDIT    Flowsheet Row Admission (Discharged) [facility] Admission (Discharged) from [facility] Admission (Discharged) from [facility]  Alcohol Use Disorder Identification Test Final Score (AUDIT) 0 0 0      GAD-7    Flowsheet Row Video Visit from 11/22/2023 in [facility] Video Visit from 10/09/2023 in [facility] Video Visit from 08/21/2023 in [facility] Video Visit from 07/10/2023 in [facility] Video Visit  from 02/06/2023 in [facility]  Total GAD-7 Score 9 10 14 18 19       PHQ2-9    Flowsheet Row Video Visit from 11/22/2023 in [facility] Video Visit from 10/09/2023 in [facility] Video Visit from 08/21/2023 in [facility] Video Visit from 07/10/2023 in [facility] Video Visit from 02/06/2023 in [facility]  PHQ-2 Total Score 1 1 0 1 2  PHQ-9 Total Score -- -- -- -- 6      Flowsheet Row Video Visit from 11/22/2023 in [facility] Video Visit from 10/09/2023 in [facility] Video Visit from 08/21/2023 in [facility]  C-SSRS RISK CATEGORY Moderate Risk Moderate Risk High Risk        Assessment and Plan:   Karen [name] is a 33 year old, Caucasian female with a past psychiatric history significant for chronic anxiety, bipolar 1 disorder, and chronic PTSD who presents to [facility] via virtual video visit for follow-up and medication management.  Patient presents to encounter stating that she continues to take her medications regularly and denies experiencing any adverse side effects.  Since starting Lexapro, patient reports that her anxiety has been much more manageable.  Patient denies experiencing any manic symptoms at this time.  Patient reports that she is currently being seen by endocrinology for the assessment of her thyroid.  She reports that her thyroid issues may be contributing to her anxiety and has appointment scheduled for later this month.  Patient denies overt depressive symptoms and states that her anxiety is much more manageable.  Patient endorses stability on her current medication regimen and would like to continue taking her medications as prescribed.  Due to patient's use of  Abilify,  provider to obtain labs from the patient during her next encounter.  Patient vocalized understanding.  Patient's PDMP was reviewed following the conclusion of the encounter.  Collaboration of Care: Collaboration of Care: Medication Management AEB provider managing patient's psychiatric medications, Psychiatrist AEB patient being followed by mental health provider at this facility, and Referral or follow-up with counselor/therapist AEB patient being seen by a licensed clinical social worker at this facility  Patient/Guardian was advised Release of Information must be obtained prior to any record release in order to collaborate their care with an outside provider. Patient/Guardian was advised if they have not already done so to contact the registration department to sign all necessary forms in order for [place] to release information regarding their care.   Consent: Patient/Guardian gives verbal consent for treatment and assignment of benefits for services provided during this visit. Patient/Guardian expressed understanding and agreed to proceed.   1. Insomnia due to other mental disorder  - ramelteon (ROZEREM) 8 MG tablet; Take 1 tablet (8 mg total) by mouth at bedtime.  Dispense: 30 tablet; Refill: 2  2. Chronic anxiety  - gabapentin (NEURONTIN) 300 MG capsule; Take 2 capsules (600 mg total) by mouth 3 (three) times daily.  Dispense: 180 capsule; Refill: 2 - escitalopram (LEXAPRO) 10 MG tablet; Take 1 tablet (10 mg total) by mouth daily.  Dispense: 30 tablet; Refill: 2  3. Bipolar 1 disorder (HCC)  - ARIPiprazole (ABILIFY) 20 MG tablet; Take 1 tablet (20 mg total) by mouth daily.  Dispense: 30 tablet; Refill: 2  Patient to follow up in 6 weeks Provider spent a total of 9 minute with the patient/reviewing the patient's chart  Meta Hatchet, PA 11/22/2023, 8:16 PM

## 2023-11-26 ENCOUNTER — Ambulatory Visit (HOSPITAL_COMMUNITY): Payer: No Payment, Other | Admitting: Licensed Clinical Social Worker

## 2023-11-26 ENCOUNTER — Encounter (HOSPITAL_COMMUNITY): Payer: Self-pay

## 2023-12-05 ENCOUNTER — Ambulatory Visit (HOSPITAL_COMMUNITY): Payer: No Payment, Other | Admitting: Licensed Clinical Social Worker

## 2023-12-05 DIAGNOSIS — F319 Bipolar disorder, unspecified: Secondary | ICD-10-CM | POA: Diagnosis not present

## 2023-12-05 DIAGNOSIS — F411 Generalized anxiety disorder: Secondary | ICD-10-CM | POA: Diagnosis not present

## 2023-12-05 NOTE — Progress Notes (Signed)
THERAPIST PROGRESS NOTE  Virtual Visit via Video Note  I connected with Karen [name] on 12/05/23 at  2:00 PM EST by a video enabled telemedicine application and verified that I am speaking with the correct person using two identifiers.  Location: Patient: [facility]  Provider: Providers Home    I discussed the limitations of evaluation and management by telemedicine and the availability of in person appointments. The patient expressed understanding and agreed to proceed.   I discussed the assessment and treatment plan with the patient. The patient was provided an opportunity to ask questions and all were answered. The patient agreed with the plan and demonstrated an understanding of the instructions.   The patient was advised to call back or seek an in-person evaluation if the symptoms worsen or if the condition fails to improve as anticipated.  I provided 60 minutes of non-face-to-face time during this encounter.   [name], [name]   Participation Level: Active  Behavioral Response: CasualAlertAnxious and Depressed  Type of Therapy: Individual Therapy  Treatment Goals addressed:  Active     Bipolar Disorder CCP Problem  1 bipolar 1 disorder       walk 3 x weekly or workout  (Progressing)     Start:  03/07/22    Expected End:  01/31/24         maintain 40 hour a week employment  (Completed/Met)     Start:  03/07/22    Expected End:  10/04/22    Resolved:  08/08/22      LTG: Stabilize mood and increase goal-directed behavior: decrease PHQ-9 below 10  (Progressing)     Start:  03/07/22    Expected End:  01/31/24         STG: Karen [name] WILL COOPERATE WITH A PSYCHIATRIC EVALUATION ON 10/05/2020  AND DURING ALL FOLLOW-UP VISITS (Completed/Met)     Start:  03/07/22    Expected End:  10/04/22    Resolved:  03/26/22      STG: Karen [name] WILL IDENTIFY COGNITIVE PATTERNS AND BELIEFS THAT INTERFERE WITH THERAPY (Progressing)     Start:  03/07/22    Expected  End:  01/31/24          decrease GAD-7 below 5  (Not Progressing)     Start:  03/07/22    Expected End:  01/31/24            WORK WITH Karen [name] TO DISCUSS RISKS AND BENEFITS OF MEDICATION TREATMENT OPTIONS FOR THIS PROBLEM AND PRESCRIBE AS INDICATED (Completed)     Start:  03/07/22    End:  03/14/22      Conduct a review of all medications to evaluate any drug/drug interactions (Completed)     Start:  03/07/22    End:  03/14/22      REVIEW WITH Karen [name] THEIR RESPONSE TO THE PRESCRIBED MEDICATION, INCLUDING ANY SIDE EFFECTS (Completed)     Start:  03/07/22    End:  03/14/22      ENCOURAGE Karen [name] TO KEEP A LOG OF MEDICATION SIDE EFFECTS, QUESTIONS REGARDING MEDICATION, AND SYMPTOMS (Completed)     Start:  03/07/22    End:  03/26/22      INSTRUCT Karen [name] TO TAKE PSYCHOTROPIC MEDICATION AS PRESCRIBED (Completed)     Start:  03/07/22    End:  03/14/22      INSTRUCT Karen [name] TO COMMUNICATE EFFECTS OF PRESCRIBED MEDICATIONS (Completed)     Start:  03/07/22    End:  03/26/22  EDUCATE Karen [name] ON BIPOLAR DISORDER DIAGNOSTIC CRITERIA (Completed)     Start:  03/07/22    End:  03/14/22      WORK WITH Karen [name] TO DEVELOP A LIST OF AT LEAST 1 CONSEQUENCES OF UNTREATED BIPOLAR SYMPTOMS (Completed)     Start:  03/07/22    End:  03/14/22        ProgressTowards Goals: Progressing  Interventions: CBT, Motivational Interviewing, and Supportive  Suicidal/Homicidal: Nowithout intent/plan  Therapist Response:   Patient was alert and oriented x 5.  She was pleasant, cooperative, maintained good eye contact.  Karen [name] presented with anxious mood\affect.  She engaged well in therapy session was dressed casually.  Patient comes in today with primary stressors at school.  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  She was trying to become a radiology tech but had to take  anatomy classes which were becoming very hard for her to pass adequately.   Patient reports by switching into healthcare billing and coding she can skip the anatomy classes and focus on memorization of the actual codes themselves.  Patient does report some stress for financials as she did not pull out of the class prior to the refund date but she was still able to withdraw from the class but will have to pay for it.  Other stressors for patient are family conflict.  She reports that she moved in with her significant other and his son.  Patient reports that overall everything has been going well but integrating two 32-year-old boys can have a challenges.  Karen [name] reports that they get along very well but they are still trying to figure out what needs and responsibilities at home.   Intervention/plan: [name] utilized supportive therapy for praise and encouragement.  [name] utilized person centered therapy for empowerment.  [name] psycho analytic therapy for patient to express thoughts, feelings and emotions and nonjudgmental environment.  [name] used motivational interviewing for open-ended questions, reflective listening, and positive affirmations.    Plan: Return again in 3 weeks.  Diagnosis: Bipolar 1 disorder (HCC)  GAD (generalized anxiety disorder)  Collaboration of Care: Other None today   Patient/Guardian was advised Release of Information must be obtained prior to any record release in order to collaborate their care with an outside provider. Patient/Guardian was advised if they have not already done so to contact the registration department to sign all necessary forms in order for [place] to release information regarding their care.   Consent: Patient/Guardian gives verbal consent for treatment and assignment of benefits for services provided during this visit. Patient/Guardian expressed understanding and agreed to proceed.   [name], [name] 12/05/2023

## 2023-12-19 ENCOUNTER — Ambulatory Visit (INDEPENDENT_AMBULATORY_CARE_PROVIDER_SITE_OTHER): Payer: No Payment, Other | Admitting: Licensed Clinical Social Worker

## 2023-12-19 DIAGNOSIS — F319 Bipolar disorder, unspecified: Secondary | ICD-10-CM

## 2023-12-19 NOTE — Progress Notes (Signed)
THERAPIST PROGRESS NOTE Virtual Visit via Video Note  I connected with Chasey Dull on 12/19/23 at  9:00 AM EST by a video enabled telemedicine application and verified that I am speaking with the correct person using two identifiers.  Location: Patient: [facility]  Provider: [facility] Home    I discussed the limitations of evaluation and management by telemedicine and the availability of in person appointments. The patient expressed understanding and agreed to proceed.     I discussed the assessment and treatment plan with the patient. The patient was provided an opportunity to ask questions and all were answered. The patient agreed with the plan and demonstrated an understanding of the instructions.   The patient was advised to call back or seek an in-person evaluation if the symptoms worsen or if the condition fails to improve as anticipated.  I provided 45 minutes of non-face-to-face time during this encounter.   [name], [name]   Participation Level: Active  Behavioral Response: CasualAlertAnxious and Depressed  Type of Therapy: Individual Therapy  Treatment Goals addressed:  Active     Bipolar Disorder CCP Problem  1 bipolar 1 disorder       walk 3 x weekly or workout  (Progressing)     Start:  03/07/22    Expected End:  01/31/24         maintain 40 hour a week employment  (Completed/Met)     Start:  03/07/22    Expected End:  10/04/22    Resolved:  08/08/22      LTG: Stabilize mood and increase goal-directed behavior: decrease PHQ-9 below 10  (Progressing)     Start:  03/07/22    Expected End:  01/31/24         STG: [name] WILL COOPERATE WITH A PSYCHIATRIC EVALUATION ON 10/05/2020  AND DURING ALL FOLLOW-UP VISITS (Completed/Met)     Start:  03/07/22    Expected End:  10/04/22    Resolved:  03/26/22      STG: [name] WILL IDENTIFY COGNITIVE PATTERNS AND BELIEFS THAT INTERFERE WITH THERAPY (Progressing)     Start:  03/07/22    Expected  End:  01/31/24          decrease GAD-7 below 5  (Not Progressing)     Start:  03/07/22    Expected End:  01/31/24            WORK WITH [name] TO DISCUSS RISKS AND BENEFITS OF MEDICATION TREATMENT OPTIONS FOR THIS PROBLEM AND PRESCRIBE AS INDICATED (Completed)     Start:  03/07/22    End:  03/14/22      Conduct a review of all medications to evaluate any drug/drug interactions (Completed)     Start:  03/07/22    End:  03/14/22      REVIEW WITH [name] THEIR RESPONSE TO THE PRESCRIBED MEDICATION, INCLUDING ANY SIDE EFFECTS (Completed)     Start:  03/07/22    End:  03/14/22      ENCOURAGE Amenda TO KEEP A LOG OF MEDICATION SIDE EFFECTS, QUESTIONS REGARDING MEDICATION, AND SYMPTOMS (Completed)     Start:  03/07/22    End:  03/26/22      INSTRUCT Jeily TO TAKE PSYCHOTROPIC MEDICATION AS PRESCRIBED (Completed)     Start:  03/07/22    End:  03/14/22      INSTRUCT Norrine TO COMMUNICATE EFFECTS OF PRESCRIBED MEDICATIONS (Completed)     Start:  03/07/22    End:  03/26/22  EDUCATE Fiora ON BIPOLAR DISORDER DIAGNOSTIC CRITERIA (Completed)     Start:  03/07/22    End:  03/14/22      WORK WITH [name] TO DEVELOP A LIST OF AT LEAST 1 CONSEQUENCES OF UNTREATED BIPOLAR SYMPTOMS (Completed)     Start:  03/07/22    End:  03/14/22         ProgressTowards Goals: Progressing  Interventions: CBT, Motivational Interviewing, and Supportive  Suicidal/Homicidal: Nowithout intent/plan  Therapist Response:   Modest was alert and oriented x 5.  She was pleasant, cooperative, maintained good eye contact.  She engaged well in therapy session was dressed casually.  She presented today with anxious mood\affect.  Patient started off session with good news that she has recently gotten engaged to her longtime boyfriend of over 1 year.  She reports that she was very surprised and excited that he asked.  Patient reports that they will have a long engagement due to her wanting to  finish schooling.    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  She is hopeful that she will finish up school in the next 12 to 18 months.  Patient reports by volunteering she hopes that this can open up a job opportunity for her after she graduates.    Patient reports some struggles for her son.  She reports that he has a problem with "lying".  She states that this is not at all the time thing but when he does fly it can spiral.  Patient reports that she continues to advocate for her son through her therapy sessions at [facility] and is having him seen multiple times monthly.   Intervention/plan: [name] utilized supportive therapy for praise and encouragement.  [name] utilized psycho analytic therapy for patient to express thoughts, feelings and emotions and nonjudgmental environment.  [name] spoke today with patient about triggers for depression and anxiety such as being a single mother and school.  Patient reports that she continues to utilize coping measures such as taking medications consistently and journaling.    Plan: Return again in 3 weeks.  Diagnosis: Bipolar 1 disorder (HCC)  Collaboration of Care: Other None today   Patient/Guardian was advised Release of Information must be obtained prior to any record release in order to collaborate their care with an outside provider. Patient/Guardian was advised if they have not already done so to contact the registration department to sign all necessary forms in order for [place] to release information regarding their care.   Consent: Patient/Guardian gives verbal consent for treatment and assignment of benefits for services provided during this visit. Patient/Guardian expressed understanding and agreed to proceed.    [name], [name] 12/19/2023

## 2023-12-23 ENCOUNTER — Telehealth (HOSPITAL_COMMUNITY): Payer: Self-pay | Admitting: Licensed Clinical Social Worker

## 2023-12-23 NOTE — Telephone Encounter (Signed)
Spoke with patient to reschedule appointment March 27th to April 3 at 9 AM.  Patient was agreeable to reschedule.

## 2024-01-10 ENCOUNTER — Ambulatory Visit (HOSPITAL_COMMUNITY): Payer: No Payment, Other | Admitting: Licensed Clinical Social Worker

## 2024-01-10 DIAGNOSIS — F319 Bipolar disorder, unspecified: Secondary | ICD-10-CM

## 2024-01-10 NOTE — Progress Notes (Signed)
 THERAPIST PROGRESS NOTE  Virtual Visit via Video Note  I connected with Yazhini Mcaulay on 01/10/24 at  9:00 AM EST by a video enabled telemedicine application and verified that I am speaking with the correct person using two identifiers.  Location: Patient: [facility]  Provider: [facility]    I discussed the limitations of evaluation and management by telemedicine and the availability of in person appointments. The patient expressed understanding and agreed to proceed.  I discussed the assessment and treatment plan with the patient. The patient was provided an opportunity to ask questions and all were answered. The patient agreed with the plan and demonstrated an understanding of the instructions.   The patient was advised to call back or seek an in-person evaluation if the symptoms worsen or if the condition fails to improve as anticipated.  I provided 45 minutes of non-face-to-face time during this encounter.  [name], [name]  Participation Level: Active  Behavioral Response: CasualAlertAnxious and Depressed  Type of Therapy: Individual Therapy  Treatment Goals addressed:  Active     Bipolar Disorder CCP Problem  1 bipolar 1 disorder       walk 3 x weekly or workout  (Progressing)     Start:  03/07/22    Expected End:  01/31/24         maintain 40 hour a week employment  (Completed/Met)     Start:  03/07/22    Expected End:  10/04/22    Resolved:  08/08/22      LTG: Stabilize mood and increase goal-directed behavior: decrease PHQ-9 below 10  (Progressing)     Start:  03/07/22    Expected End:  01/31/24         STG: [name] WILL COOPERATE WITH A PSYCHIATRIC EVALUATION ON 10/05/2020  AND DURING ALL FOLLOW-UP VISITS (Completed/Met)     Start:  03/07/22    Expected End:  10/04/22    Resolved:  03/26/22      STG: [name] WILL IDENTIFY COGNITIVE PATTERNS AND BELIEFS THAT INTERFERE WITH THERAPY (Progressing)     Start:  03/07/22    Expected End:   01/31/24          decrease GAD-7 below 5  (Not Progressing)     Start:  03/07/22    Expected End:  01/31/24            WORK WITH [name] TO DISCUSS RISKS AND BENEFITS OF MEDICATION TREATMENT OPTIONS FOR THIS PROBLEM AND PRESCRIBE AS INDICATED (Completed)     Start:  03/07/22    End:  03/14/22      Conduct a review of all medications to evaluate any drug/drug interactions (Completed)     Start:  03/07/22    End:  03/14/22      REVIEW WITH [name] THEIR RESPONSE TO THE PRESCRIBED MEDICATION, INCLUDING ANY SIDE EFFECTS (Completed)     Start:  03/07/22    End:  03/14/22      ENCOURAGE Onedia TO KEEP A LOG OF MEDICATION SIDE EFFECTS, QUESTIONS REGARDING MEDICATION, AND SYMPTOMS (Completed)     Start:  03/07/22    End:  03/26/22      INSTRUCT Tonesha TO TAKE PSYCHOTROPIC MEDICATION AS PRESCRIBED (Completed)     Start:  03/07/22    End:  03/14/22      INSTRUCT Lamia TO COMMUNICATE EFFECTS OF PRESCRIBED MEDICATIONS (Completed)     Start:  03/07/22    End:  03/26/22      EDUCATE [name]  ON BIPOLAR DISORDER DIAGNOSTIC CRITERIA (Completed)     Start:  03/07/22    End:  03/14/22      WORK WITH [name] TO DEVELOP A LIST OF AT LEAST 1 CONSEQUENCES OF UNTREATED BIPOLAR SYMPTOMS (Completed)     Start:  03/07/22    End:  03/14/22         ProgressTowards Goals: Progressing  Interventions: CBT, Motivational Interviewing, and Supportive  Suicidal/Homicidal: Nowithout intent/plan  Therapist Response:     Kerrin was alert and oriented x 5.  She was pleasant, cooperative, maintained good eye contact.  She engaged well in therapy session and was dressed casually.  Sreya presented with euthymic mood\affect.  Patient comes in today with primary stressors as family stress.  Patient reports that she is trying to find a balance between moving, school, and family.  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  Patient states that this is all  while she is trying to move in with her fianc.  Azalea also reports the stress of planning a wedding.  Patient reports today that overall the transition to the new home with her fianc has been good but does not come without stress.  Patient reports stresses for moving such as integrating her fianc's child with her child, combining each other stuff, and overall communication.   Lajoya also reports she is trying to manage the custody arrangement between herself and her son Arlow father.  This has been a struggle since [place] moved 30 minutes away from her son's father.  Edithe endorses symptoms overall for tension, worry, and restlessness.  Zarie reports as long as she is taking her medications her anxiety is manageable.  This is as evidenced by if she skips dosages for more than 1 day per anxiety increases.  Intervention/plan: [name] use interventions for psychoanalytic therapy, supportive therapy, person Center therapy, and cognitive behavioral therapy.  Interventions utilized for each therapy was cognitive restructuring, praise, encouragement, empowerment, and expressing self in session and nonjudgmental environment.  Plan for patient is to continue to decrease stress by utilizing coping skills for organization, prioritization, and working out.  Plan: Return again in 4 weeks.  Diagnosis: Bipolar 1 disorder (HCC)  Collaboration of Care: Other None today   Patient/Guardian was advised Release of Information must be obtained prior to any record release in order to collaborate their care with an outside provider. Patient/Guardian was advised if they have not already done so to contact the registration department to sign all necessary forms in order for [place] to release information regarding their care.   Consent: Patient/Guardian gives verbal consent for treatment and assignment of benefits for services provided during this visit. Patient/Guardian expressed understanding and agreed to proceed.   [name], [name] 01/10/2024

## 2024-01-17 ENCOUNTER — Encounter (HOSPITAL_COMMUNITY): Payer: Self-pay | Admitting: Physician Assistant

## 2024-01-17 ENCOUNTER — Telehealth (HOSPITAL_COMMUNITY): Payer: No Payment, Other | Admitting: Physician Assistant

## 2024-01-17 DIAGNOSIS — F5105 Insomnia due to other mental disorder: Secondary | ICD-10-CM | POA: Diagnosis not present

## 2024-01-17 DIAGNOSIS — F99 Mental disorder, not otherwise specified: Secondary | ICD-10-CM | POA: Diagnosis not present

## 2024-01-17 DIAGNOSIS — F319 Bipolar disorder, unspecified: Secondary | ICD-10-CM

## 2024-01-17 DIAGNOSIS — F419 Anxiety disorder, unspecified: Secondary | ICD-10-CM

## 2024-01-17 MED ORDER — RAMELTEON 8 MG PO TABS
8.0000 mg | ORAL_TABLET | Freq: Every day | ORAL | 2 refills | Status: DC
Start: 2024-01-17 — End: 2024-03-20

## 2024-01-17 MED ORDER — GABAPENTIN 300 MG PO CAPS: 600.0000 mg | ORAL_CAPSULE | Freq: Three times a day (TID) | ORAL | 2 refills | Status: DC

## 2024-01-17 MED ORDER — LAMOTRIGINE 100 MG PO TABS
100.0000 mg | ORAL_TABLET | Freq: Two times a day (BID) | ORAL | 2 refills | Status: DC
Start: 2024-01-17 — End: 2024-03-20

## 2024-01-17 MED ORDER — ESCITALOPRAM OXALATE 5 MG PO TABS
5.0000 mg | ORAL_TABLET | Freq: Every day | ORAL | 2 refills | Status: DC
Start: 2024-01-17 — End: 2024-03-20

## 2024-01-17 MED ORDER — ARIPIPRAZOLE 20 MG PO TABS
20.0000 mg | ORAL_TABLET | Freq: Every day | ORAL | 2 refills | Status: DC
Start: 2024-01-17 — End: 2024-03-20

## 2024-01-17 NOTE — Progress Notes (Cosign Needed Addendum)
 BH MD/PA/NP OP Progress Note  Virtual Visit via Video Note  I connected with Karen [name] on 01/17/24 at  4:00 PM EDT by a video enabled telemedicine application and verified that I am speaking with the correct person using two identifiers.  Location: Patient: Home Provider: Clinic   I discussed the limitations of evaluation and management by telemedicine and the availability of in person appointments. The patient expressed understanding and agreed to proceed.  Follow Up Instructions:  I discussed the assessment and treatment plan with the patient. The patient was provided an opportunity to ask questions and all were answered. The patient agreed with the plan and demonstrated an understanding of the instructions.   The patient was advised to call back or seek an in-person evaluation if the symptoms worsen or if the condition fails to improve as anticipated.  I provided 8 minutes of non-face-to-face time during this encounter.  Meta Hatchet, PA    01/17/2024 9:48 PM Maresa Morash  MRN:  [id number]  Chief Complaint:  Chief Complaint  Patient presents with   Follow-up   Medication Management   HPI:   Karen [name] is a 33 year old, Caucasian female with a past psychiatric history significant for chronic anxiety, bipolar 1 disorder, and chronic PTSD who presents to [facility] via virtual video visit for follow-up and medication management.  Patient is currently being managed on the following psychiatric medications:  Ramelteon 8 mg at bedtime Gabapentin 600 mg 3 times daily Lamotrigine 100 mg 2 times daily Abilify 20 mg daily Escitalopram 10 mg daily  Patient reports that her use of her medications have been going well.  She believes that her Lexapro has been causing her to be tired and is requesting adjustments.  Patient denies overt depression and states that her anxiety has been manageable.  Patient stressors include  school and planning her wedding.  A PHQ-9 screen was performed with the patient scoring an 8.  A GAD-7 screen was performed with the patient scoring a 11.  Patient is alert and oriented x4, pleasant, calm, cooperative, and fully engaged in conversation during the encounter.  Patient describes her mood as tired.  Patient exhibits euthymic mood with appropriate affect.  Patient denies suicidal or homicidal ideations.  She further denies auditory or visual hallucinations and does not appear to be responding to internal/external stimuli.  Patient endorses hypersomnia and sleeps roughly 12 hours a day as well as napping.  Patient endorses fair appetite and eats on average 2 meals per day.  Patient denies alcohol as a function or illicit drug use.  Patient denies tobacco use but does engage in vaping.  Visit Diagnosis:  No diagnosis found.   Past Psychiatric History:  Bipolar disorder Generalized anxiety disorder Visual hallucinations Chronic PTSD  Past Medical History:  Past Medical History:  Diagnosis Date   Anxiety    Bipolar disorder (HCC)    Chronic UTI    Depression    Endometriosis    HSV (herpes simplex virus) anogenital infection    PTSD (post-traumatic stress disorder)     Past Surgical History:  Procedure Laterality Date   CESAREAN SECTION N/A 02/17/2018   Procedure: CESAREAN SECTION;  Surgeon: Essie Hart, MD;  Location: [facility];  Service: Obstetrics;  Laterality: N/A;   COLONOSCOPY     DIAGNOSTIC LAPAROSCOPY  11/05/2012   DILATION AND EVACUATION N/A 03/11/2015   Procedure: DILATATION AND EVACUATION;  Surgeon: Hoover Browns, MD;  Location: [facility] OR[facility];  Service: Gynecology;  Laterality: N/A;   DRUG INDUCED ENDOSCOPY     LAPAROSCOPY     LAPAROSCOPY N/A 06/07/2021   Procedure: LAPAROSCOPY OPERATIVE with /EXCISION OF LESIONS and lysis of adhesions;  Surgeon: Hoover Browns, MD;  Location: [facility] OR;  Service: Gynecology;  Laterality: N/A;   TONSILLECTOMY  11/05/2013    Family  Psychiatric History:  Mother - Major Depressive Disorder Sister - Obsessive Compulsive Disorder Aunt (maternal) - Bipolar I Disorder Uncle (maternal) - Bipolar I Disorder  Family History:  Family History  Problem Relation Age of Onset   Rheum arthritis Mother    Cancer Father     Social History:  Social History   Socioeconomic History   Marital status: Married    Spouse name: Gaylen Pereira   Number of children: 1   Years of education: Not on file   Highest education level: Some college, no degree  Occupational History   Not on file  Tobacco Use   Smoking status: Every Day    Types: E-cigarettes   Smokeless tobacco: Never   Tobacco comments:    PT VAPES DAILY, '' I vape a lot ''   Vaping Use   Vaping status: Every Day   Substances: Nicotine, Flavoring  Substance and Sexual Activity   Alcohol use: Not Currently   Drug use: Not Currently    Types: Heroin, Cocaine, Marijuana    Comment: Clean since Aug 2015   Sexual activity: Yes    Partners: Male  Other Topics Concern   Not on file  Social History Narrative   Not on file   Social Drivers of Health   Financial Resource Strain: Low Risk  (11/08/2023)   Received from [facility]   Overall Financial Resource Strain (CARDIA)    Difficulty of Paying Living Expenses: Not hard at all  Food Insecurity: No Food Insecurity (11/08/2023)   Received from [facility]   Hunger Vital Sign    Worried About Running Out of Food in the Last Year: Never true    Ran Out of Food in the Last Year: Never true  Transportation Needs: No Transportation Needs (11/08/2023)   Received from [facility] - Transportation    Lack of Transportation (Medical): No    Lack of Transportation (Non-Medical): No  Physical Activity: Insufficiently Active (11/08/2023)   Received from [facility]   Exercise Vital Sign    Days of Exercise per Week: 1 day    Minutes of Exercise per Session: 60 min  Stress: Stress Concern Present (11/08/2023)    Received from [facility] - Occupational Stress Questionnaire    Feeling of Stress : To some extent  Social Connections: Socially Integrated (11/08/2023)   [facility] from [facility]   Social Network    How would you rate your social network (family, work, friends)?: Good participation with social networks    Allergies:  Allergies  Allergen Reactions   Ciprofloxacin Hives   Wound Dressing Adhesive Rash    Metabolic Disorder Labs: Lab Results  Component Value Date   HGBA1C 4.6 (L) 07/02/2022   MPG 85.32 07/02/2022   MPG 85.32 09/11/2020   No results found for: "PROLACTIN" Lab Results  Component Value Date   CHOL 117 07/02/2022   TRIG 94 07/02/2022   HDL 29 (L) 07/02/2022   CHOLHDL 4.0 07/02/2022   VLDL 19 07/02/2022   LDLCALC 69 07/02/2022   LDLCALC 78 05/09/2021   Lab Results  Component  Value Date   TSH 0.519 07/02/2022   TSH 0.784 09/11/2020    Therapeutic Level Labs: Lab Results  Component Value Date   LITHIUM 0.27 (L) 07/06/2022   No results found for: "VALPROATE" No results found for: "CBMZ"  Current Medications: Current Outpatient Medications  Medication Sig Dispense Refill   acetaminophen (TYLENOL) 325 MG tablet Take 2 tablets (650 mg total) by mouth every 6 (six) hours as needed for mild pain or moderate pain. 30 tablet 1   ARIPiprazole (ABILIFY) 20 MG tablet Take 1 tablet (20 mg total) by mouth daily. 30 tablet 2   clonazePAM (KLONOPIN) 0.5 MG tablet Take 1 tablet (0.5 mg total) by mouth daily as needed for anxiety. 30 tablet 0   escitalopram (LEXAPRO) 10 MG tablet Take 1 tablet (10 mg total) by mouth daily. 30 tablet 2   gabapentin (NEURONTIN) 300 MG capsule Take 2 capsules (600 mg total) by mouth 3 (three) times daily. 180 capsule 2   hydrOXYzine (ATARAX) 25 MG tablet Take 1 tablet (25 mg total) by mouth 3 (three) times daily as needed for anxiety. 75 tablet 1   lamoTRIgine (LAMICTAL) 100 MG tablet Take 1  tablet (100 mg total) by mouth 2 (two) times daily. 60 tablet 1   medroxyPROGESTERone (DEPO-PROVERA) 150 MG/ML injection Inject 150 mg into the muscle every 3 (three) months. Next dose due 07/03/22     Multiple Vitamins-Minerals (MULTIVITAMIN WITH MINERALS) tablet Take 1 tablet by mouth daily.     promethazine (PHENERGAN) 25 MG tablet Take 1 tablet (25 mg total) by mouth every 6 (six) hours as needed for nausea or vomiting. 20 tablet 0   ramelteon (ROZEREM) 8 MG tablet Take 1 tablet (8 mg total) by mouth at bedtime. 30 tablet 2   No current facility-administered medications for this visit.     Musculoskeletal: Strength & Muscle Tone: within normal limits Gait & Station: normal Patient leans: N/A  Psychiatric Specialty Exam: Review of Systems  Psychiatric/Behavioral:  Positive for sleep disturbance. Negative for decreased concentration, dysphoric mood, hallucinations, self-injury and suicidal ideas. The patient is not nervous/anxious and is not hyperactive.     There were no vitals taken for this visit.There is no height or weight on file to calculate BMI.  General Appearance: Casual  Eye Contact:  Good  Speech:  Clear and Coherent and Normal Rate  Volume:  Normal  Mood:  Euthymic  Affect:  Appropriate  Thought Process:  Coherent, Goal Directed, and Descriptions of Associations: Intact  Orientation:  Full (Time, Place, and Person)  Thought Content: WDL   Suicidal Thoughts:  No  Homicidal Thoughts:  No  Memory:  Immediate;   Good Recent;   Good Remote;   Good  Judgement:  Good  Insight:  Good  Psychomotor Activity:  Normal  Concentration:  Concentration: Good and Attention Span: Good  Recall:  Good  Fund of Knowledge: Good  Language: Good  Akathisia:  No  Handed:  Ambidextrous  AIMS (if indicated): not done  Assets:  Communication Skills Desire for Improvement Housing Social Support Vocational/Educational  ADL's:  Intact  Cognition: WNL  Sleep:  Fair    Screenings: AIMS    Flowsheet Row Ad[facility] (Discharged) [facility] Ad[facility] (Discharged) from [facility] Admission (Discharged) from [facility]  AIMS Total Score 0 0 0  AUDIT    Flowsheet Row Admission (Discharged) [facility] Admission (Discharged) from [facility] Admission (Discharged) from [facility]  Alcohol Use Disorder Identification Test Final Score (AUDIT) 0 0 0      GAD-7    Flowsheet Row Video Visit from 01/17/2024 in [facility] Video Visit from 11/22/2023 in [facility] Video Visit from 10/09/2023 in [facility] Video Visit from 08/21/2023 in [facility] Video Visit from 07/10/2023 in [facility]  Total GAD-7 Score 11 9 10 14 18       PHQ2-9    Flowsheet Row Video Visit from 01/17/2024 in [facility] Video Visit from 11/22/2023 in [facility] Video Visit from 10/09/2023 in [facility] Video Visit from 08/21/2023 in [facility] Video Visit from 07/10/2023 in [facility]  PHQ-2 Total Score 2 1 1  0 1  PHQ-9 Total Score 8 -- -- -- --      Flowsheet Row Video Visit from 01/17/2024 in [facility] Video Visit from 11/22/2023 in [facility] Video Visit from 10/09/2023 in [facility]  C-SSRS RISK CATEGORY Moderate Risk Moderate Risk Moderate  Risk        Assessment and Plan:   Karen [name] is a 33 year old, Caucasian female with a past psychiatric history significant for chronic anxiety, bipolar 1 disorder, and chronic PTSD who presents to [facility] via virtual video visit for follow-up and medication management.  Patient presents today encounter stating that she has been taking her medications regularly.  She does report that her use of Lexapro has caused her to be more tired and is requesting for her dosage to be adjusted.  Provider recommended patient Lexapro dosage to be adjusted from 10 mg to 5 mg daily for the management of her depression and anxiety.  Patient was agreeable to recommendation.  Patient to continue taking all other medications as prescribed.  Patient's medications to be e-prescribed to pharmacy of choice.  Collaboration of Care: Collaboration of Care: Medication Management AEB provider managing patient's psychiatric medications, Psychiatrist AEB patient being followed by mental health provider at this facility, and Referral or follow-up with counselor/therapist AEB patient being seen by a licensed clinical social worker at this facility  Patient/Guardian was advised Release of Information must be obtained prior to any record release in order to collaborate their care with an outside provider. Patient/Guardian was advised if they have not already done so to contact the registration department to sign all necessary forms in order for [place] to release information regarding their care.   Consent: Patient/Guardian gives verbal consent for treatment and assignment of benefits for services provided during this visit. Patient/Guardian expressed understanding and agreed to proceed.   1. Bipolar 1 disorder (HCC)  - ARIPiprazole (ABILIFY) 20 MG tablet; Take 1 tablet (20 mg total) by mouth daily.  Dispense: 30 tablet; Refill: 2 - lamoTRIgine (LAMICTAL) 100 MG tablet; Take 1 tablet  (100 mg total) by mouth 2 (two) times daily.  Dispense: 60 tablet; Refill: 2  2. Chronic anxiety  - gabapentin (NEURONTIN) 300 MG capsule; Take 2 capsules (600 mg total) by mouth 3 (  three) times daily.  Dispense: 180 capsule; Refill: 2 - escitalopram (LEXAPRO) 5 MG tablet; Take 1 tablet (5 mg total) by mouth daily.  Dispense: 30 tablet; Refill: 2  3. Insomnia due to other mental disorder  - ramelteon (ROZEREM) 8 MG tablet; Take 1 tablet (8 mg total) by mouth at bedtime.  Dispense: 30 tablet; Refill: 2  Patient to follow up in 2 months Provider spent a total of 8 minute with the patient/reviewing the patient's chart  Meta Hatchet, PA 01/17/2024 9:48 PM

## 2024-01-30 ENCOUNTER — Ambulatory Visit (HOSPITAL_COMMUNITY): Payer: No Payment, Other | Admitting: Licensed Clinical Social Worker

## 2024-02-04 ENCOUNTER — Telehealth (HOSPITAL_COMMUNITY): Payer: Self-pay | Admitting: *Deleted

## 2024-02-04 NOTE — Telephone Encounter (Signed)
 Hello,   Blood work - liver enzymes are really high - PCP wants to make sure no medications from provider here are interacting with liver enzymes. Please reach out to PT when time allows to discuss  Screened by nursing staff        JNL

## 2024-02-06 ENCOUNTER — Ambulatory Visit (HOSPITAL_COMMUNITY): Payer: No Payment, Other | Admitting: Licensed Clinical Social Worker

## 2024-02-06 DIAGNOSIS — F319 Bipolar disorder, unspecified: Secondary | ICD-10-CM | POA: Diagnosis not present

## 2024-02-06 NOTE — Progress Notes (Signed)
 THERAPIST PROGRESS NOTE  Virtual Visit via Telephone Note  I connected with Karen [name] on 02/06/24 at  9:00 AM EDT by telephone and verified that I am speaking with the correct person using two identifiers.  Location: Patient: Karen [name]  Provider: Provider Home    I discussed the limitations, risks, security and privacy concerns of performing an evaluation and management service by telephone and the availability of in person appointments. I also discussed with the patient that there may be a patient responsible charge related to this service. The patient expressed understanding and agreed to proceed.   I discussed the assessment and treatment plan with the patient. The patient was provided an opportunity to ask questions and all were answered. The patient agreed with the plan and demonstrated an understanding of the instructions.   The patient was advised to call back or seek an in-person evaluation if the symptoms worsen or if the condition fails to improve as anticipated.  I provided 52 minutes of non-face-to-face time during this encounter.   [name], [name]   Participation Level: Active  Behavioral Response: CasualAlertAnxious and Depressed  Type of Therapy: Individual Therapy  Treatment Goals addressed:  Active     Bipolar Disorder CCP Problem  1 bipolar 1 disorder       walk 3 x weekly or workout  (Progressing)     Start:  03/07/22    Expected End:  09/04/24         maintain 40 hour a week employment  (Completed/Met)     Start:  03/07/22    Expected End:  10/04/22    Resolved:  08/08/22      LTG: Stabilize mood and increase goal-directed behavior: decrease PHQ-9 below 10  (Progressing)     Start:  03/07/22    Expected End:  09/04/24         STG: Karen [name] WILL COOPERATE WITH A PSYCHIATRIC EVALUATION ON 10/05/2020  AND DURING ALL FOLLOW-UP VISITS (Completed/Met)     Start:  03/07/22    Expected End:  10/04/22    Resolved:  03/26/22       STG: Karen [name] WILL IDENTIFY COGNITIVE PATTERNS AND BELIEFS THAT INTERFERE WITH THERAPY (Progressing)     Start:  03/07/22    Expected End:  09/04/24          decrease GAD-7 below 5  (Not Progressing)     Start:  03/07/22    Expected End:  09/04/24            WORK WITH Karen [name] TO DISCUSS RISKS AND BENEFITS OF MEDICATION TREATMENT OPTIONS FOR THIS PROBLEM AND PRESCRIBE AS INDICATED (Completed)     Start:  03/07/22    End:  03/14/22      Conduct a review of all medications to evaluate any drug/drug interactions (Completed)     Start:  03/07/22    End:  03/14/22      REVIEW WITH Karen [name] THEIR RESPONSE TO THE PRESCRIBED MEDICATION, INCLUDING ANY SIDE EFFECTS (Completed)     Start:  03/07/22    End:  03/14/22      ENCOURAGE Karen [name] TO KEEP A LOG OF MEDICATION SIDE EFFECTS, QUESTIONS REGARDING MEDICATION, AND SYMPTOMS (Completed)     Start:  03/07/22    End:  03/26/22      INSTRUCT Karen [name] TO TAKE PSYCHOTROPIC MEDICATION AS PRESCRIBED (Completed)     Start:  03/07/22    End:  03/14/22      INSTRUCT Karen [name] TO COMMUNICATE  EFFECTS OF PRESCRIBED MEDICATIONS (Completed)     Start:  03/07/22    End:  03/26/22      EDUCATE Karen [name] ON BIPOLAR DISORDER DIAGNOSTIC CRITERIA (Completed)     Start:  03/07/22    End:  03/14/22      WORK WITH Karen [name] TO DEVELOP A LIST OF AT LEAST 1 CONSEQUENCES OF UNTREATED BIPOLAR SYMPTOMS (Completed)     Start:  03/07/22    End:  03/14/22         ProgressTowards Goals: Progressing  Interventions: CBT and Supportive  Suicidal/Homicidal: Nowithout intent/plan  Therapist Response:     Karen [name] was alert and oriented x 5.  She was pleasant, cooperative, maintained good eye contact.  She engaged well in therapy session was dressed casually.  She presented today with anxious mood\affect.  Patient reports primary stressors as parenting, ex relationship, housing, and illness.  Patient reports today that she is currently navigating how to coparent  with her fianc.  Karen [name] states that they have recently moved in together and having a stepson has proven to be difficult with disciplining.  Patient reports that he just does not listen to her and at discipline type way.  Patient also reports that there is some behavioral issues with her biological son Karen [name] where he did a kid at school and will now have to engage in in-school therapy.  Other stressors for patient include housing.  Patient reports that she has recently moved in with her fianc.  She reports that there is some dispute on whether to sell the house after it is paid off for just upgrade it with renovations.  Karen [name] reports that she wants to upgrade the house rather than sell it but her partner does not necessarily agree and thinks he will be more of a headache.  Final stressor for patient is ex relationship and illness.  Patient reports that her ex-partner is moving away and she is fearful that he is going to disengage with her son are low.  Patient reports that this could be frustrating and could be overwhelming for her son.  Patient also reports stressors for illness as her liver enzymes are high they cannot figure out why.  Intervention/plan: [name] utilized psychoanalytic therapy for patient to express thoughts, feelings and emotions [name] utilized cognitive behavioral therapy for cognitive restructuring and reframing.  [name] utilized motivational interviewing for open-ended questions and reflective listening.  [name] spoke with patient today about behavioral modifications for her stepchild and son.  [name] educated patient on communicating with current fianc and ex-partner to decrease tension and worry and communicate plans with one another.  Plan: Return again in 3 weeks.  Diagnosis: Bipolar 1 disorder (HCC)  Collaboration of Care: Other None today   Patient/Guardian was advised Release of Information must be obtained prior to any record release in order to collaborate their care with an  outside provider. Patient/Guardian was advised if they have not already done so to contact the registration department to sign all necessary forms in order for [place] to release information regarding their care.   Consent: Patient/Guardian gives verbal consent for treatment and assignment of benefits for services provided during this visit. Patient/Guardian expressed understanding and agreed to proceed.   [name], [name] 02/06/2024

## 2024-02-25 ENCOUNTER — Ambulatory Visit (INDEPENDENT_AMBULATORY_CARE_PROVIDER_SITE_OTHER): Admitting: Licensed Clinical Social Worker

## 2024-02-25 DIAGNOSIS — F319 Bipolar disorder, unspecified: Secondary | ICD-10-CM

## 2024-02-25 DIAGNOSIS — F411 Generalized anxiety disorder: Secondary | ICD-10-CM | POA: Diagnosis not present

## 2024-02-25 NOTE — Progress Notes (Signed)
 THERAPIST PROGRESS NOTE  Virtual Visit via Video Note  I connected with Rebeckah Masih on 02/25/24 at 11:00 AM EDT by a video enabled telemedicine application and verified that I am speaking with the correct person using two identifiers.  Location: Patient: [facility]  Provider: [facility]    I discussed the limitations of evaluation and management by telemedicine and the availability of in person appointments. The patient expressed understanding and agreed to proceed.     I discussed the assessment and treatment plan with the patient. The patient was provided an opportunity to ask questions and all were answered. The patient agreed with the plan and demonstrated an understanding of the instructions.   The patient was advised to call back or seek an in-person evaluation if the symptoms worsen or if the condition fails to improve as anticipated.  I provided 45  minutes of non-face-to-face time during this encounter.   Maryagnes Small, [name]   Participation Level: Active  Behavioral Response: CasualAlertAnxious and Depressed  Type of Therapy: Individual Therapy  Treatment Goals addressed:  Active     Bipolar Disorder CCP Problem  1 bipolar 1 disorder       walk 3 x weekly or workout  (Progressing)     Start:  03/07/22    Expected End:  09/04/24         maintain 40 hour a week employment  (Completed/Met)     Start:  03/07/22    Expected End:  10/04/22    Resolved:  08/08/22      LTG: Stabilize mood and increase goal-directed behavior: decrease PHQ-9 below 10  (Progressing)     Start:  03/07/22    Expected End:  09/04/24         STG: [name] WILL COOPERATE WITH A PSYCHIATRIC EVALUATION ON 10/05/2020  AND DURING ALL FOLLOW-UP VISITS (Completed/Met)     Start:  03/07/22    Expected End:  10/04/22    Resolved:  03/26/22      STG: [name] WILL IDENTIFY COGNITIVE PATTERNS AND BELIEFS THAT INTERFERE WITH THERAPY (Progressing)     Start:  03/07/22     Expected End:  09/04/24          decrease GAD-7 below 5  (Progressing)     Start:  03/07/22    Expected End:  09/04/24            WORK WITH [name] TO DISCUSS RISKS AND BENEFITS OF MEDICATION TREATMENT OPTIONS FOR THIS PROBLEM AND PRESCRIBE AS INDICATED (Completed)     Start:  03/07/22    End:  03/14/22      Conduct a review of all medications to evaluate any drug/drug interactions (Completed)     Start:  03/07/22    End:  03/14/22      REVIEW WITH [name] THEIR RESPONSE TO THE PRESCRIBED MEDICATION, INCLUDING ANY SIDE EFFECTS (Completed)     Start:  03/07/22    End:  03/14/22      ENCOURAGE Solina TO KEEP A LOG OF MEDICATION SIDE EFFECTS, QUESTIONS REGARDING MEDICATION, AND SYMPTOMS (Completed)     Start:  03/07/22    End:  03/26/22      INSTRUCT Joss TO TAKE PSYCHOTROPIC MEDICATION AS PRESCRIBED (Completed)     Start:  03/07/22    End:  03/14/22      INSTRUCT Ardith TO COMMUNICATE EFFECTS OF PRESCRIBED MEDICATIONS (Completed)     Start:  03/07/22    End:  03/26/22  EDUCATE Juanette ON BIPOLAR DISORDER DIAGNOSTIC CRITERIA (Completed)     Start:  03/07/22    End:  03/14/22      WORK WITH [name] TO DEVELOP A LIST OF AT LEAST 1 CONSEQUENCES OF UNTREATED BIPOLAR SYMPTOMS (Completed)     Start:  03/07/22    End:  03/14/22         ProgressTowards Goals: Progressing  Interventions: CBT, Motivational Interviewing, and Supportive   Suicidal/Homicidal: Nowithout intent/plan  Therapist Response:   Samreet was alert and oriented x 5.  She was pleasant, cooperative, maintained good eye contact.  She engaged well in therapy session was dressed casually.  Patient states today that she is struggling with communication with her partner and blinding her partner's family with hers.  Patient reports that she has recently moved in with her boyfriend\fianc.  Patient reports that they are learning how to plan their 2 families together as he has a son and she has a  son.  Lorrinda reports that she is having struggles with this as they both have different viewpoints on how to parent.  Rasheen acknowledges that she can be overbearing at times.  Patient reports that she knew that this was going to be a struggle but did not think that it would take this along to transition.    Intervention/plan: [name] utilized psychoanalytic therapy for patient to express thoughts, feelings and concerns and nonjudgmental environment.  [name] utilize supportive therapy for praise and encouragement.  [name] spoke with patient about communication techniques with fianc.  Communication techniques include exchanging each other's values of parenting with 1 another and exchanging 3 things that each other can improve on.  Patient to do these exercises with her significant other 1 to 2 weeks apart   Plan: Return again in 3 weeks.  Diagnosis: GAD (generalized anxiety disorder)  Bipolar depression (HCC)  Collaboration of Care: Other None today   Patient/Guardian was advised Release of Information must be obtained prior to any record release in order to collaborate their care with an outside provider. Patient/Guardian was advised if they have not already done so to contact the registration department to sign all necessary forms in order for us  to release information regarding their care.   Consent: Patient/Guardian gives verbal consent for treatment and assignment of benefits for services provided during this visit. Patient/Guardian expressed understanding and agreed to proceed.   Maryagnes Small, [name] 02/25/2024

## 2024-02-28 ENCOUNTER — Ambulatory Visit (HOSPITAL_COMMUNITY): Admitting: Licensed Clinical Social Worker

## 2024-03-19 ENCOUNTER — Ambulatory Visit (INDEPENDENT_AMBULATORY_CARE_PROVIDER_SITE_OTHER): Admitting: Licensed Clinical Social Worker

## 2024-03-19 DIAGNOSIS — F319 Bipolar disorder, unspecified: Secondary | ICD-10-CM | POA: Diagnosis not present

## 2024-03-19 NOTE — Progress Notes (Signed)
 THERAPIST PROGRESS NOTE  Virtual Visit via Video Note  I connected with Edelle Schley on 03/19/24 at  2:00 PM EDT by a video enabled telemedicine application and verified that I am speaking with the correct person using two identifiers.  Location: Patient: [facility]  Provider: [facility]    I discussed the limitations of evaluation and management by telemedicine and the availability of in person appointments. The patient expressed understanding and agreed to proceed.    I discussed the assessment and treatment plan with the patient. The patient was provided an opportunity to ask questions and all were answered. The patient agreed with the plan and demonstrated an understanding of the instructions.   The patient was advised to call back or seek an in-person evaluation if the symptoms worsen or if the condition fails to improve as anticipated.  I provided 30 minutes of non-face-to-face time during this encounter.   Maryagnes Small, [name]   Participation Level: Active  Behavioral Response: CasualAlertAnxious and Depressed  Type of Therapy: Individual Therapy  Treatment Goals addressed:  Active     Bipolar Disorder CCP Problem  1 bipolar 1 disorder       walk 3 x weekly or workout  (Progressing)     Start:  03/07/22    Expected End:  09/04/24         maintain 40 hour a week employment  (Completed/Met)     Start:  03/07/22    Expected End:  10/04/22    Resolved:  08/08/22      LTG: Stabilize mood and increase goal-directed behavior: decrease PHQ-9 below 10  (Progressing)     Start:  03/07/22    Expected End:  09/04/24         STG: [name] WILL COOPERATE WITH A PSYCHIATRIC EVALUATION ON 10/05/2020  AND DURING ALL FOLLOW-UP VISITS (Completed/Met)     Start:  03/07/22    Expected End:  10/04/22    Resolved:  03/26/22      STG: [name] WILL IDENTIFY COGNITIVE PATTERNS AND BELIEFS THAT INTERFERE WITH THERAPY (Progressing)     Start:  03/07/22    Expected  End:  09/04/24          decrease GAD-7 below 5  (Progressing)     Start:  03/07/22    Expected End:  09/04/24            WORK WITH [name] TO DISCUSS RISKS AND BENEFITS OF MEDICATION TREATMENT OPTIONS FOR THIS PROBLEM AND PRESCRIBE AS INDICATED (Completed)     Start:  03/07/22    End:  03/14/22      Conduct a review of all medications to evaluate any drug/drug interactions (Completed)     Start:  03/07/22    End:  03/14/22      REVIEW WITH [name] THEIR RESPONSE TO THE PRESCRIBED MEDICATION, INCLUDING ANY SIDE EFFECTS (Completed)     Start:  03/07/22    End:  03/14/22      ENCOURAGE Jameisha TO KEEP A LOG OF MEDICATION SIDE EFFECTS, QUESTIONS REGARDING MEDICATION, AND SYMPTOMS (Completed)     Start:  03/07/22    End:  03/26/22      INSTRUCT Martrice TO TAKE PSYCHOTROPIC MEDICATION AS PRESCRIBED (Completed)     Start:  03/07/22    End:  03/14/22      INSTRUCT Jennalee TO COMMUNICATE EFFECTS OF PRESCRIBED MEDICATIONS (Completed)     Start:  03/07/22    End:  03/26/22  EDUCATE Malikah ON BIPOLAR DISORDER DIAGNOSTIC CRITERIA (Completed)     Start:  03/07/22    End:  03/14/22      WORK WITH [name] TO DEVELOP A LIST OF AT LEAST 1 CONSEQUENCES OF UNTREATED BIPOLAR SYMPTOMS (Completed)     Start:  03/07/22    End:  03/14/22         ProgressTowards Goals: Progressing  Interventions: CBT, Motivational Interviewing, and Supportive   Suicidal/Homicidal: Nowithout intent/plan  Therapist Response:    Patient was alert and oriented x 5.  She was pleasant, cooperative, maintained good eye contact.  Patient was 12 minutes late for session stating that she fell asleep and thought appointment was not until 330.  Kylynne reports that she has not been sleeping well due to restless leg syndrome.  She also reports irregular labs that she needs to follow up with a specialist for, but patient reports she cannot afford it as she is uninsured.  Kaitlan states that she has  investigated Medicaid, but it only qualifies for family planning Medicaid.  She also reports that she has investigated financial assistance through multiple hospital organizations but that she is not eligible for it due to an IRA that her father left her.  Laquasha states that she is not able to take too much money out of the IRA due to tax purposes.    Jesi endorses symptoms for insomnia, restless leg syndrome, irritability, mood swings, lack of motivation, and poor motivation.  She reports that she feels guilty for putting more pressure on her partner and not being able to do her regular responsibilities that she has.  Yara states that currently her and her significant other are okay, but she reports "how long would outlast for it discontinues".  Intervention/plan: [name] utilized psychoanalytic therapy for patient to express thoughts, feelings and emotions and nonjudgmental environment.  [name] validated feelings in session.  [name] utilized cognitive behavioral therapy for reframing.  [name] utilized gratitude exercises such as appreciating 3 things about herself daily no matter how big or small.  [name] spoke with patient about realistic expectations based on how she is feeling.  [name] spoke with patient about communicating with her partner about how she is feeling to externalize these thoughts and concerns while still attempting to do when she can.  [name] message for medication provider about restless leg syndrome.    Plan: Return again in 3 weeks.  Diagnosis: Bipolar depression (HCC)  Collaboration of Care: Other None today   Patient/Guardian was advised Release of Information must be obtained prior to any record release in order to collaborate their care with an outside provider. Patient/Guardian was advised if they have not already done so to contact the registration department to sign all necessary forms in order for us  to release information regarding their care.   Consent: Patient/Guardian gives  verbal consent for treatment and assignment of benefits for services provided during this visit. Patient/Guardian expressed understanding and agreed to proceed.   Maryagnes Small, [name] 03/19/2024

## 2024-03-20 ENCOUNTER — Encounter (HOSPITAL_COMMUNITY): Payer: Self-pay | Admitting: Physician Assistant

## 2024-03-20 ENCOUNTER — Telehealth (INDEPENDENT_AMBULATORY_CARE_PROVIDER_SITE_OTHER): Admitting: Physician Assistant

## 2024-03-20 DIAGNOSIS — F419 Anxiety disorder, unspecified: Secondary | ICD-10-CM | POA: Diagnosis not present

## 2024-03-20 DIAGNOSIS — F319 Bipolar disorder, unspecified: Secondary | ICD-10-CM

## 2024-03-20 DIAGNOSIS — F99 Mental disorder, not otherwise specified: Secondary | ICD-10-CM

## 2024-03-20 DIAGNOSIS — F5105 Insomnia due to other mental disorder: Secondary | ICD-10-CM | POA: Diagnosis not present

## 2024-03-20 DIAGNOSIS — Z79899 Other long term (current) drug therapy: Secondary | ICD-10-CM

## 2024-03-20 DIAGNOSIS — Z5181 Encounter for therapeutic drug level monitoring: Secondary | ICD-10-CM

## 2024-03-20 MED ORDER — RAMELTEON 8 MG PO TABS
8.0000 mg | ORAL_TABLET | Freq: Every day | ORAL | 1 refills | Status: DC
Start: 2024-03-20 — End: 2024-05-01

## 2024-03-20 MED ORDER — ESCITALOPRAM OXALATE 5 MG PO TABS
5.0000 mg | ORAL_TABLET | Freq: Every day | ORAL | 1 refills | Status: DC
Start: 2024-03-20 — End: 2024-06-26

## 2024-03-20 MED ORDER — LAMOTRIGINE 100 MG PO TABS: 100.0000 mg | ORAL_TABLET | Freq: Two times a day (BID) | ORAL | 1 refills | Status: DC

## 2024-03-20 MED ORDER — ARIPIPRAZOLE 10 MG PO TABS: 10.0000 mg | ORAL_TABLET | Freq: Every day | ORAL | 1 refills | Status: DC

## 2024-03-20 MED ORDER — GABAPENTIN 300 MG PO CAPS
600.0000 mg | ORAL_CAPSULE | Freq: Three times a day (TID) | ORAL | 1 refills | Status: DC
Start: 2024-03-20 — End: 2024-05-01

## 2024-03-20 NOTE — Progress Notes (Signed)
 [facility] MD/PA/NP OP Progress Note  Virtual Visit via Video Note  I connected with Karen [name] on 03/20/24 at  3:30 PM EDT by a video enabled telemedicine application and verified that I am speaking with the correct person using two identifiers.  Location: Patient: Home Provider: Clinic   I discussed the limitations of evaluation and management by telemedicine and the availability of in person appointments. The patient expressed understanding and agreed to proceed.  Follow Up Instructions:  I discussed the assessment and treatment plan with the patient. The patient was provided an opportunity to ask questions and all were answered. The patient agreed with the plan and demonstrated an understanding of the instructions.   The patient was advised to call back or seek an in-person evaluation if the symptoms worsen or if the condition fails to improve as anticipated.  I provided 23 minutes of non-face-to-face time during this encounter.  Gates Kasal, PA    03/20/2024 4:56 PM Karen [name]  MRN:  [id number]  Chief Complaint:  Chief Complaint  Patient presents with   Follow-up   Medication Management   HPI:   Karen [name] is a 33 year old, Caucasian female with a past psychiatric history significant for chronic anxiety, bipolar 1 disorder, and chronic PTSD who presents to [facility] via virtual video visit for follow-up and medication management.  Patient is currently being managed on the following psychiatric medications:  Ramelteon  8 mg at bedtime Gabapentin  600 mg 3 times daily Lamotrigine  100 mg 2 times daily Abilify  20 mg daily Escitalopram  5 mg daily  Patient presents to the encounter stating that she has been experiencing restless legs at night and has not been able to sleep more than an hour at a time for the past 5 days.  She reports that she has been experiencing restless leg for a couple of weeks.  Patient also  endorses involuntary twitching.  Patient reports that she feels like jumping out of her skin at times and reports that she always feels restlessness in her arms and legs.  Patient denies pacing but does report that she occasionally rocks back and forth, shifts her posture frequently, and has the urge to move her legs.  She reports that she sleeps most of the day due to not receiving sleep the night before.  Patient reports that she has been feeling mostly depressed and rates her depression as 7 out of 10 with 10 being most severe.  Patient endorses depressive episodes every day with symptoms lasting the whole day.  She reports there are days where her mood may improve but states that those days are few and far between.  Patient endorses the following depressive symptoms: feelings of sadness, lack of motivation, decreased energy, feelings of guilt/worthlessness, hopelessness, and crying spells.  She reports that she occasionally experiences manic symptoms characterized by improved mood, hyperactivity, and has since of euphoria.  She denies engaging in risky behavior.  Patient also endorses anxiety and rates her anxiety at 9 out of 10.  She endorses anxiety due to not getting things done during the day but denies experiencing panic attacks.  In addition to depression and anxiety, patient reports that she has not felt well and has been experiencing vomiting daily.  She reports that her liver enzymes were recently checked and were found to be elevated.  Patient is alert and oriented x4, pleasant, calm, cooperative, and fully engaged in conversation during the encounter.  Patient endorses low mood.  Patient exhibits depressed mood with congruent affect.  Patient denies suicidal or homicidal ideations.  She further denies auditory or visual hallucinations and does not appear to be responding to internal/external stimuli.  Patient endorses poor sleep and receives on average 3 to 4 hours of sleep per night.  Patient  endorses fair appetite needs on average 1-2 meals per day.  Patient denies alcohol consumption, tobacco use, or illicit drug use.  Visit Diagnosis:    ICD-10-CM   1. Long term current use of antipsychotic medication  Z79.899 Lipid panel    HgB A1c    Comprehensive Metabolic Panel (CMET)    CBC with Differential/Platelet    Ambulatory referral to Cardiology    2. Bipolar 1 disorder (HCC)  F31.9 lamoTRIgine  (LAMICTAL ) 100 MG tablet    ARIPiprazole  (ABILIFY ) 10 MG tablet    3. Chronic anxiety  F41.9 escitalopram  (LEXAPRO ) 5 MG tablet    gabapentin  (NEURONTIN ) 300 MG capsule    4. Insomnia due to other mental disorder  F51.05 ramelteon  (ROZEREM ) 8 MG tablet   F99       Past Psychiatric History:  Bipolar disorder Generalized anxiety disorder Visual hallucinations Chronic PTSD  Past Medical History:  Past Medical History:  Diagnosis Date   Anxiety    Bipolar disorder (HCC)    Chronic UTI    Depression    Endometriosis    HSV (herpes simplex virus) anogenital infection    PTSD (post-traumatic stress disorder)     Past Surgical History:  Procedure Laterality Date   CESAREAN SECTION N/A 02/17/2018   Procedure: CESAREAN SECTION;  Surgeon: Artemisa Bile, MD;  Location: [facility];  Service: Obstetrics;  Laterality: N/A;   COLONOSCOPY     DIAGNOSTIC LAPAROSCOPY  11/05/2012   DILATION AND EVACUATION N/A 03/11/2015   Procedure: DILATATION AND EVACUATION;  Surgeon: Vernal Gold, MD;  Location: [facility] ORS;  Service: Gynecology;  Laterality: N/A;   DRUG INDUCED ENDOSCOPY     LAPAROSCOPY     LAPAROSCOPY N/A 06/07/2021   Procedure: LAPAROSCOPY OPERATIVE with /EXCISION OF LESIONS and lysis of adhesions;  Surgeon: Vernal Gold, MD;  Location: [facility] OR;  Service: Gynecology;  Laterality: N/A;   TONSILLECTOMY  11/05/2013    Family Psychiatric History:  Mother - Major Depressive Disorder Sister - Obsessive Compulsive Disorder Aunt (maternal) - Bipolar I Disorder Uncle (maternal) - Bipolar I  Disorder  Family History:  Family History  Problem Relation Age of Onset   Rheum arthritis Mother    Cancer Father     Social History:  Social History   Socioeconomic History   Marital status: Married    Spouse name: Wenona [place]   Number of children: 1   Years of education: Not on file   Highest education level: Some college, no degree  Occupational History   Not on file  Tobacco Use   Smoking status: Every Day    Types: E-cigarettes   Smokeless tobacco: Never   Tobacco comments:    PT VAPES DAILY, '' I vape a lot ''   Vaping Use   Vaping status: Every Day   Substances: Nicotine , Flavoring  Substance and Sexual Activity   Alcohol use: Not Currently   Drug use: Not Currently    Types: Heroin, Cocaine, Marijuana    Comment: Clean since Aug 2015   Sexual activity: Yes    Partners: Male  Other Topics Concern   Not on file  Social History Narrative   Not on file   Social Drivers  of Health   Financial Resource Strain: Low Risk  (11/08/2023)   Received from [facility]   Overall Financial Resource Strain (CARDIA)    Difficulty of Paying Living Expenses: Not hard at all  Food Insecurity: No Food Insecurity (11/08/2023)   [facility]   Hunger Vital Sign    Worried About Running Out of Food in the Last Year: Never true    Ran Out of Food in the Last Year: Never true  Transportation Needs: No Transportation Needs (11/08/2023)   Received from [facility] - Transportation    Lack of Transportation (Medical): No    Lack of Transportation (Non-Medical): No  Physical Activity: Insufficiently Active (11/08/2023)   Received from [facility]   Exercise Vital Sign    Days of Exercise per Week: 1 day    Minutes of Exercise per Session: 60 min  Stress: Stress Concern Present (11/08/2023)   Received from [facility] - Occupational Stress Questionnaire    Feeling of Stress : To some extent  Social  Connections: Socially Integrated (11/08/2023)   Received from [facility]   Social Network    How would you rate your social network (family, work, friends)?: Good participation with social networks    Allergies:  Allergies  Allergen Reactions   Ciprofloxacin Hives   Wound Dressing Adhesive Rash    Metabolic Disorder Labs: Lab Results  Component Value Date   HGBA1C 4.6 (L) 07/02/2022   MPG 85.32 07/02/2022   MPG 85.32 09/11/2020   No results found for: "PROLACTIN" Lab Results  Component Value Date   CHOL 117 07/02/2022   TRIG 94 07/02/2022   HDL 29 (L) 07/02/2022   CHOLHDL 4.0 07/02/2022   VLDL 19 07/02/2022   LDLCALC 69 07/02/2022   LDLCALC 78 05/09/2021   Lab Results  Component Value Date   TSH 0.519 07/02/2022   TSH 0.784 09/11/2020    Therapeutic Level Labs: Lab Results  Component Value Date   LITHIUM  0.27 (L) 07/06/2022   No results found for: "VALPROATE" No results found for: "CBMZ"  Current Medications: Current Outpatient Medications  Medication Sig Dispense Refill   acetaminophen  (TYLENOL ) 325 MG tablet Take 2 tablets (650 mg total) by mouth every 6 (six) hours as needed for mild pain or moderate pain. 30 tablet 1   ARIPiprazole  (ABILIFY ) 10 MG tablet Take 1 tablet (10 mg total) by mouth daily. 30 tablet 1   clonazePAM  (KLONOPIN ) 0.5 MG tablet Take 1 tablet (0.5 mg total) by mouth daily as needed for anxiety. 30 tablet 0   escitalopram  (LEXAPRO ) 5 MG tablet Take 1 tablet (5 mg total) by mouth daily. 30 tablet 1   gabapentin  (NEURONTIN ) 300 MG capsule Take 2 capsules (600 mg total) by mouth 3 (three) times daily. 180 capsule 1   hydrOXYzine  (ATARAX ) 25 MG tablet Take 1 tablet (25 mg total) by mouth 3 (three) times daily as needed for anxiety. 75 tablet 1   lamoTRIgine  (LAMICTAL ) 100 MG tablet Take 1 tablet (100 mg total) by mouth 2 (two) times daily. 60 tablet 1   medroxyPROGESTERone  (DEPO-PROVERA ) 150 MG/ML injection Inject 150 mg into the muscle every 3  (three) months. Next dose due 07/03/22     Multiple Vitamins-Minerals (MULTIVITAMIN WITH MINERALS) tablet Take 1 tablet by mouth daily.     promethazine  (PHENERGAN ) 25 MG tablet Take 1 tablet (25 mg total) by mouth every 6 (six) hours as needed for nausea  or vomiting. 20 tablet 0   ramelteon  (ROZEREM ) 8 MG tablet Take 1 tablet (8 mg total) by mouth at bedtime. 30 tablet 1   No current facility-administered medications for this visit.     Musculoskeletal: Strength & Muscle Tone: within normal limits Gait & Station: normal Patient leans: N/A  Psychiatric Specialty Exam: Review of Systems  Psychiatric/Behavioral:  Positive for dysphoric mood and sleep disturbance. Negative for decreased concentration, hallucinations, self-injury and suicidal ideas. The patient is nervous/anxious. The patient is not hyperactive.     There were no vitals taken for this visit.There is no height or weight on file to calculate BMI.  General Appearance: Casual  Eye Contact:  Good  Speech:  Clear and Coherent and Normal Rate  Volume:  Normal  Mood:  Anxious and Depressed  Affect:  Congruent  Thought Process:  Coherent, Goal Directed, and Descriptions of Associations: Intact  Orientation:  Full (Time, Place, and Person)  Thought Content: WDL   Suicidal Thoughts:  No  Homicidal Thoughts:  No  Memory:  Immediate;   Good Recent;   Good Remote;   Good  Judgement:  Good  Insight:  Good  Psychomotor Activity:  Normal  Concentration:  Concentration: Good and Attention Span: Good  Recall:  Good  Fund of Knowledge: Good  Language: Good  Akathisia:  Yes  Handed:  Ambidextrous  AIMS (if indicated): not done  Assets:  Communication Skills Desire for Improvement Housing Social Support Vocational/Educational  ADL's:  Intact  Cognition: WNL  Sleep:  Fair   Screenings: AIMS    Flowsheet Row Video [facility] from [facility]20/20[facility] Ad[facility] (Discharged) from OP [facility] from OP Visit [facility] (Discharged) from OP [facility]  AIMS Total Score 9 0 0 0      AUDIT    Flowsheet Row Admission (Discharged) from [facility] (Discharged) from OP Visit from [facility] Ad[facility] (Discharged) from OP Visit [facility]  Alcohol Use Disorder Identification Test Final Score (AUDIT) 0 0 0      GAD-7    Flowsheet Row Video Visit from 03/20/2024 in [facility] Video Visit from 01/17/2024 in [facility] Video Visit from 11/22/2023 in [facility] Video Visit from 10/09/2023 in [facility] Video Visit from 08/21/2023 in [facility]  Total GAD-7 Score 14 11 9 10 14       PHQ2-9    Flowsheet Row Video Visit from 03/20/2024 in [facility] Video Visit from 01/17/2024 in [facility] Video Visit from 11/22/2023 in [facility] Video Visit from 10/09/2023 in [facility] Video Visit from 08/21/2023 in [facility]  PHQ-2 Total Score 6 2 1 1  0  PHQ-9 Total Score 20 8 -- -- --      Flowsheet Row Video Visit from 03/20/2024 in [facility] Video Visit from 01/17/2024 in [facility] Video Visit from 11/22/2023 in [facility]  C-SSRS RISK CATEGORY Moderate Risk Moderate Risk Moderate Risk  Assessment and Plan:   Karen [name] is a 33 year old,  Caucasian female with a past psychiatric history significant for chronic anxiety, bipolar 1 disorder, and chronic PTSD who presents to [facility] via virtual video visit for follow-up and medication management.  Patient presents to the encounter stating that she has been experiencing restlessness/restless leg for the past 2 weeks.  Due to her restless leg, patient reports that she has been having difficulty sleeping for the past 5 days.  She reports that her restless leg has been going on for a couple of weeks.  In addition to restless leg, patient reports that she occasionally feels like she is jumping out of her skin at times.  She denies pacing but states that she often shifts her posture frequently, rocks back and forth, and has the urge to move her legs.  An aims assessment was performed with the patient scoring a 9.  Based on my assessment, patient presents with appears to be akathisia attributed to her use of Abilify .  Provider recommended patient decrease her dosage of Abilify  from 20 mg to 10 mg daily to decrease the severity of her akathisia.  Patient was agreeable to recommendation.  Patient also informed provider that her liver enzymes were recently found to be elevated.  Patient also notes that she has been vomiting more frequently lately.  Patient denies any other changes in her medication management.  Patient's elevated enzymes may be attributed to her use of Abilify .  Patient informed provider that her liver enzymes would be checked by her primary care provider within the next week.  Patient continues to endorse worsening depressive symptoms and anxiety.  On occasion, she reports that she experiences manic symptoms that do not last for long.  Patient was agreeable to having her Abilify  dosage be adjusted from 20 mg to 10 mg daily for the management of her akathisia.  Provider to continue to assess patient's mood after her Abilify  dosage has been  adjusted.  Patient to continue taking all of her other medications as prescribed.  Patient's medications to be prescribed to pharmacy of choice.  Provider discussed with patient about obtaining the following labs from her primary care provider: Lipid profile, hemoglobin A1c, complete blood count with differential, and comprehensive metabolic panel.  Patient also informed provider that she would get an up-to-date EKG from her primary care provider.  A [place] Suicide Severity Rating Scale was performed with the patient being considered moderate risk.  Patient denies suicidal ideations and is able to contract for safety following the conclusion of the encounter.  Collaboration of Care: Collaboration of Care: Medication Management AEB provider managing patient's psychiatric medications, Psychiatrist AEB patient being followed by mental health provider at this facility, and Referral or follow-up with counselor/therapist AEB patient being seen by a licensed clinical social worker at this facility  Patient/Guardian was advised Release of Information must be obtained prior to any record release in order to collaborate their care with an outside provider. Patient/Guardian was advised if they have not already done so to contact the registration department to sign all necessary forms in order for us  to release information regarding their care.   Consent: Patient/Guardian gives verbal consent for treatment and assignment of benefits for services provided during this visit. Patient/Guardian expressed understanding and agreed to proceed.   1. Long term current use of antipsychotic medication (Primary) Labs to be obtained from patient's primary care office at [facility] EKG to be obtained from patient's  primary care office  2. Bipolar 1 disorder (HCC)  - lamoTRIgine  (LAMICTAL ) 100 MG tablet; Take 1 tablet (100 mg total) by mouth 2 (two) times daily.  Dispense: 60 tablet; Refill: 1 - ARIPiprazole  (ABILIFY )  10 MG tablet; Take 1 tablet (10 mg total) by mouth daily.  Dispense: 30 tablet; Refill: 1  3. Chronic anxiety  - escitalopram  (LEXAPRO ) 5 MG tablet; Take 1 tablet (5 mg total) by mouth daily.  Dispense: 30 tablet; Refill: 1 - gabapentin  (NEURONTIN ) 300 MG capsule; Take 2 capsules (600 mg total) by mouth 3 (three) times daily.  Dispense: 180 capsule; Refill: 1  4. Insomnia due to other mental disorder  - ramelteon  (ROZEREM ) 8 MG tablet; Take 1 tablet (8 mg total) by mouth at bedtime.  Dispense: 30 tablet; Refill: 1  Patient to follow up in 2 months Provider spent a total of 23 minute with the patient/reviewing the patient's chart  Gates Kasal, PA 03/20/2024, 4:56 PM

## 2024-03-23 ENCOUNTER — Telehealth (HOSPITAL_COMMUNITY): Payer: Self-pay | Admitting: Physician Assistant

## 2024-03-31 ENCOUNTER — Other Ambulatory Visit (HOSPITAL_COMMUNITY): Payer: Self-pay | Admitting: Physician Assistant

## 2024-03-31 DIAGNOSIS — T50905A Adverse effect of unspecified drugs, medicaments and biological substances, initial encounter: Secondary | ICD-10-CM

## 2024-03-31 MED ORDER — BENZTROPINE MESYLATE 0.5 MG PO TABS
0.5000 mg | ORAL_TABLET | Freq: Every day | ORAL | 1 refills | Status: DC
Start: 2024-03-31 — End: 2024-04-29

## 2024-03-31 NOTE — Telephone Encounter (Signed)
 Message acknowledged and reviewed. Provider was able to reach out to patient regarding concerns over her medication adjustment. Patient's concerns were addressed and her medication was readjusted accordingly. See note.

## 2024-03-31 NOTE — Telephone Encounter (Signed)
 Message acknowledged and reviewed.

## 2024-03-31 NOTE — Progress Notes (Signed)
 Provider was able to reach out to patient regarding concerns over her medication adjustments (Abilify ).  Patient reports that since adjusting her Abilify , she has been experiencing episodes of crying spells as well as elevated mood.  Patient also endorses fluctuations in her mood.  She states that her restlessness is still occurring but states that it occurs more so in the day and less at night.  Provider recommended adjusting patient's Abilify  dosage from 10 mg to 20 mg daily for mood stability.  Provider recommended placing patient on benztropine 0.5 mg daily for the management of her restlessness (akathisia).  Patient was agreeable to recommendation.  Patient's medication to be e-prescribed to pharmacy of choice.

## 2024-04-10 ENCOUNTER — Ambulatory Visit (INDEPENDENT_AMBULATORY_CARE_PROVIDER_SITE_OTHER): Admitting: Licensed Clinical Social Worker

## 2024-04-10 DIAGNOSIS — F319 Bipolar disorder, unspecified: Secondary | ICD-10-CM | POA: Diagnosis not present
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
## 2024-04-24 ENCOUNTER — Other Ambulatory Visit (HOSPITAL_COMMUNITY): Payer: Self-pay | Admitting: Physician Assistant

## 2024-04-24 DIAGNOSIS — T50905A Adverse effect of unspecified drugs, medicaments and biological substances, initial encounter: Secondary | ICD-10-CM

## 2024-04-30 ENCOUNTER — Ambulatory Visit (INDEPENDENT_AMBULATORY_CARE_PROVIDER_SITE_OTHER): Admitting: Licensed Clinical Social Worker

## 2024-04-30 DIAGNOSIS — F319 Bipolar disorder, unspecified: Secondary | ICD-10-CM | POA: Diagnosis not present
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
## 2024-05-01 ENCOUNTER — Telehealth (INDEPENDENT_AMBULATORY_CARE_PROVIDER_SITE_OTHER): Admitting: Physician Assistant

## 2024-05-01 ENCOUNTER — Encounter (HOSPITAL_COMMUNITY): Payer: Self-pay | Admitting: Physician Assistant

## 2024-05-01 DIAGNOSIS — F99 Mental disorder, not otherwise specified: Secondary | ICD-10-CM

## 2024-05-01 DIAGNOSIS — F419 Anxiety disorder, unspecified: Secondary | ICD-10-CM

## 2024-05-01 DIAGNOSIS — Z79899 Other long term (current) drug therapy: Secondary | ICD-10-CM

## 2024-05-01 DIAGNOSIS — F17298 Nicotine dependence, other tobacco product, with other nicotine-induced disorders: Secondary | ICD-10-CM | POA: Diagnosis not present

## 2024-05-01 DIAGNOSIS — F319 Bipolar disorder, unspecified: Secondary | ICD-10-CM | POA: Diagnosis not present

## 2024-05-01 DIAGNOSIS — F5105 Insomnia due to other mental disorder: Secondary | ICD-10-CM

## 2024-05-01 MED ORDER — GABAPENTIN 300 MG PO CAPS: 600.0000 mg | ORAL_CAPSULE | Freq: Three times a day (TID) | ORAL | 1 refills | Status: DC

## 2024-05-01 MED ORDER — ARIPIPRAZOLE 20 MG PO TABS: 20.0000 mg | ORAL_TABLET | Freq: Every day | ORAL | 1 refills | Status: DC

## 2024-05-01 MED ORDER — LAMOTRIGINE 100 MG PO TABS: 100.0000 mg | ORAL_TABLET | Freq: Two times a day (BID) | ORAL | 1 refills | Status: DC

## 2024-05-01 MED ORDER — RAMELTEON 8 MG PO TABS: 8.0000 mg | ORAL_TABLET | Freq: Every day | ORAL | 1 refills | Status: DC

## 2024-05-01 MED ORDER — NICOTINE POLACRILEX 2 MG MT GUM
2.0000 mg | CHEWING_GUM | OROMUCOSAL | 0 refills | Status: AC | PRN
Start: 2024-05-01 — End: ?

## 2024-05-01 NOTE — Progress Notes (Unsigned)
 BH MD/PA/NP OP Progress Note  Virtual Visit via Video Note  I connected with Karen [name] on 05/01/24 at  3:30 PM EDT by a video enabled telemedicine application and verified that I am speaking with the correct person using two identifiers.  Location: Patient: Home Provider: Clinic   I discussed the limitations of evaluation and management by telemedicine and the availability of in person appointments. The patient expressed understanding and agreed to proceed.  Follow Up Instructions:  I discussed the assessment and treatment plan with the patient. The patient was provided an opportunity to ask questions and all were answered. The patient agreed with the plan and demonstrated an understanding of the instructions.   The patient was advised to call back or seek an in-person evaluation if the symptoms worsen or if the condition fails to improve as anticipated.  I provided 23 minutes of non-face-to-face time during this encounter.  Karen FORBES Bolster, PA    05/01/2024 3:30 PM Karen [name]  MRN:  [id number]  Chief Complaint:  Chief Complaint  Patient presents with   Follow-up   Medication Management   HPI:   Karen [name] is a 33 year old, Caucasian female with a past psychiatric history significant for chronic anxiety, bipolar 1 disorder, and chronic PTSD who presents to [facility] via virtual video visit for follow-up and medication management.  Patient is currently being managed on the following psychiatric medications:  Ramelteon  8 mg at bedtime Gabapentin  600 mg 3 times daily Lamotrigine  100 mg 2 times daily Abilify  20 mg daily Escitalopram  5 mg daily  Patient reports that things have been good for her lately.  She does report that she has been struggling with heat intolerance and read somewhere that SSRIs can cause some intolerance to the heat.  Patient reports that her mood has improved since readjusting her Abilify  back  to her previous dosage.  She reports that benztropine  has not been helpful with managing her restlessness/restless leg syndrome.  She reports that her restless leg has worsened.  Patient endorses depression and rates her depression a 3 out of 10 with 10 being most severe.  Patient endorses depressive episodes 1 day/week.  Patient endorses the following depressive symptoms: lack of motivation and neglecting her activities of daily living.  Patient endorses anxiety and rates her anxiety a 6 out of 10.  Patient attributes her anxiety to having a really stressful week.  Patient's current stressors include her upcoming wedding date, school, taking care of her kids during the summer, her dog being sick, and having a new puppy.  A PHQ-9 screen was performed with the patient scoring a 10.  A GAD-7 screen was also performed with the patient scoring a 16.  Patient is alert and oriented x4, pleasant, calm, cooperative, and fully engaged in conversation during the encounter.  Patient endorses irritable mood and states that her irritability is attributed to situational factors.  Patient exhibits anxious mood with appropriate affect.  Patient denies suicidal or homicidal ideations.  She further denies auditory or visual hallucinations and does not appear to be responding to internal/external stimuli.  Patient endorses fair sleep and receives on average 9 hours of intermittent sleep.  Patient endorses okay appetite.  Patient denies alcohol consumption or illicit drug use.  Patient denies tobacco use but does engage in vaping.  Visit Diagnosis:    ICD-10-CM   1. 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 Long term current use of antipsychotic medication  Z79.899     3. Bipolar 1 disorder (HCC)  F31.9 ARIPiprazole  (ABILIFY ) 20 MG tablet    lamoTRIgine  (LAMICTAL ) 100 MG tablet    4. Chronic anxiety  F41.9 gabapentin  (NEURONTIN )  300 MG capsule    5. Insomnia due to other mental disorder  F51.05 ramelteon  (ROZEREM ) 8 MG tablet   F99       Past Psychiatric History:  Bipolar disorder Generalized anxiety disorder Visual hallucinations Chronic PTSD  Past Medical History:  Past Medical History:  Diagnosis Date   Anxiety    Bipolar disorder (HCC)    Chronic UTI    Depression    Endometriosis    HSV (herpes simplex virus) anogenital infection    PTSD (post-traumatic stress disorder)     Past Surgical History:  Procedure Laterality Date   CESAREAN SECTION N/A 02/17/2018   Procedure: CESAREAN SECTION;  Surgeon: Bettina Muskrat, MD;  Location: [facility];  Service: Obstetrics;  Laterality: N/A;   COLONOSCOPY     DIAGNOSTIC LAPAROSCOPY  11/05/2012   DILATION AND EVACUATION N/A 03/11/2015   Procedure: DILATATION AND EVACUATION;  Surgeon: Jerolyn Foil, MD;  Location: [facility] ORS;  Service: Gynecology;  Laterality: N/A;   DRUG INDUCED ENDOSCOPY     LAPAROSCOPY     LAPAROSCOPY N/A 06/07/2021   Procedure: LAPAROSCOPY OPERATIVE with /EXCISION OF LESIONS and lysis of adhesions;  Surgeon: Foil Jerolyn, MD;  Location: [facility] OR;  Service: Gynecology;  Laterality: N/A;   TONSILLECTOMY  11/05/2013    Family Psychiatric History:  Mother - Major Depressive Disorder Sister - Obsessive Compulsive Disorder Aunt (maternal) - Bipolar I Disorder Uncle (maternal) - Bipolar I Disorder  Family History:  Family History  Problem Relation Age of Onset   Rheum arthritis Mother    Cancer Father     Social History:  Social History   Socioeconomic History   Marital status: Married    Spouse name: Paiden Caraveo   Number of children: 1   Years of education: Not on file   Highest education level: Some college, no degree  Occupational History   Not on file  Tobacco Use   Smoking status: Every Day    Types: E-cigarettes   Smokeless tobacco: Never   Tobacco comments:    PT VAPES DAILY, '' I vape a lot ''   Vaping Use   Vaping  status: Every Day   Substances: Nicotine , Flavoring  Substance and Sexual Activity   Alcohol use: Not Currently   Drug use: Not Currently    Types: Heroin, Cocaine, Marijuana    Comment: Clean since Aug 2015   Sexual activity: Yes    Partners: Male  Other Topics Concern   Not on file  Social History Narrative   Not on file   Social Drivers of Health   Financial Resource Strain: Low Risk  (11/08/2023)   Received from [facility]   Overall Financial Resource Strain (CARDIA)    Difficulty of Paying Living Expenses: Not hard at all  Food Insecurity: No Food Insecurity (11/08/2023)   Received from [facility]   Hunger Vital Sign    Within the past 12 months, you worried that your food would run out before you got the money to buy more.: Never true    Within the past 12 months, the food you bought just didn't last and you didn't have money to get more.: Never true  Transportation Needs: No Transportation Needs (11/08/2023)   Received  from [facility]   PRAPARE - Transportation    Lack of Transportation (Medical): No    Lack of Transportation (Non-Medical): No  Physical Activity: Insufficiently Active (11/08/2023)   Received from [facility]   Exercise Vital Sign    On average, how many days per week do you engage in moderate to strenuous exercise (like a brisk walk)?: 1 day    On average, how many minutes do you engage in exercise at this level?: 60 min  Stress: Stress Concern Present (11/08/2023)   Received from [facility] - Occupational Stress Questionnaire    Feeling of Stress : To some extent  Social Connections: Socially Integrated (11/08/2023)   Received from [facility]   Social Network    How would you rate your social network (family, work, friends)?: Good participation with social networks    Allergies:  Allergies  Allergen Reactions   Ciprofloxacin Hives   Wound Dressing Adhesive Rash    Metabolic Disorder Labs: Lab  Results  Component Value Date   HGBA1C 4.6 (L) 07/02/2022   MPG 85.32 07/02/2022   MPG 85.32 09/11/2020   No results found for: PROLACTIN Lab Results  Component Value Date   CHOL 117 07/02/2022   TRIG 94 07/02/2022   HDL 29 (L) 07/02/2022   CHOLHDL 4.0 07/02/2022   VLDL 19 07/02/2022   LDLCALC 69 07/02/2022   LDLCALC 78 05/09/2021   Lab Results  Component Value Date   TSH 0.519 07/02/2022   TSH 0.784 09/11/2020    Therapeutic Level Labs: Lab Results  Component Value Date   LITHIUM  0.27 (L) 07/06/2022   No results found for: VALPROATE No results found for: CBMZ  Current Medications: Current Outpatient Medications  Medication Sig Dispense Refill   nicotine  polacrilex (NICORETTE ) 2 MG gum Take 1 each (2 mg total) by mouth as needed for smoking cessation. 100 tablet 0   acetaminophen  (TYLENOL ) 325 MG tablet Take 2 tablets (650 mg total) by mouth every 6 (six) hours as needed for mild pain or moderate pain. 30 tablet 1   ARIPiprazole  (ABILIFY ) 20 MG tablet Take 1 tablet (20 mg total) by mouth daily. 30 tablet 1   benztropine  (COGENTIN ) 0.5 MG tablet TAKE 1 TABLET BY MOUTH EVERY DAY 90 tablet 1   clonazePAM  (KLONOPIN ) 0.5 MG tablet Take 1 tablet (0.5 mg total) by mouth daily as needed for anxiety. 30 tablet 0   escitalopram  (LEXAPRO ) 5 MG tablet Take 1 tablet (5 mg total) by mouth daily. 30 tablet 1   gabapentin  (NEURONTIN ) 300 MG capsule Take 2 capsules (600 mg total) by mouth 3 (three) times daily. 180 capsule 1   hydrOXYzine  (ATARAX ) 25 MG tablet Take 1 tablet (25 mg total) by mouth 3 (three) times daily as needed for anxiety. 75 tablet 1   lamoTRIgine  (LAMICTAL ) 100 MG tablet Take 1 tablet (100 mg total) by mouth 2 (two) times daily. 60 tablet 1   medroxyPROGESTERone  (DEPO-PROVERA ) 150 MG/ML injection Inject 150 mg into the muscle every 3 (three) months. Next dose due 07/03/22     Multiple Vitamins-Minerals (MULTIVITAMIN WITH MINERALS) tablet Take 1 tablet by mouth  daily.     promethazine  (PHENERGAN ) 25 MG tablet Take 1 tablet (25 mg total) by mouth every 6 (six) hours as needed for nausea or vomiting. 20 tablet 0   ramelteon  (ROZEREM ) 8 MG tablet Take 1 tablet (8 mg total) by mouth at bedtime. 30 tablet 1   No current  facility-administered medications for this visit.     Musculoskeletal: Strength & Muscle Tone: within normal limits Gait & Station: normal Patient leans: N/A  Psychiatric Specialty Exam: Review of Systems  Psychiatric/Behavioral:  Positive for dysphoric mood and sleep disturbance. Negative for decreased concentration, hallucinations, self-injury and suicidal ideas. The patient is nervous/anxious. The patient is not hyperactive.     There were no vitals taken for this visit.There is no height or weight on file to calculate BMI.  General Appearance: Casual  Eye Contact:  Good  Speech:  Clear and Coherent and Normal Rate  Volume:  Normal  Mood:  Anxious and Depressed  Affect:  Congruent  Thought Process:  Coherent, Goal Directed, and Descriptions of Associations: Intact  Orientation:  Full (Time, Place, and Person)  Thought Content: WDL   Suicidal Thoughts:  No  Homicidal Thoughts:  No  Memory:  Immediate;   Good Recent;   Good Remote;   Good  Judgement:  Good  Insight:  Good  Psychomotor Activity:  Normal  Concentration:  Concentration: Good and Attention Span: Good  Recall:  Good  Fund of Knowledge: Good  Language: Good  Akathisia:  Yes  Handed:  Ambidextrous  AIMS (if indicated): not done  Assets:  Communication Skills Desire for Improvement Housing Social Support Vocational/Educational  ADL's:  Intact  Cognition: WNL  Sleep:  Fair   Screenings: AIMS    Flowsheet Row Video Visit from 05/01/2024 in [facility] Video Visit from 03/20/2024 in [facility] Admission (Discharged) from OP [facility]  Admission (Discharged[facility] from OP [facility] Admission (Discharged) from OP [facility]  AIMS Total Score 8 9 0 0 0   AUDIT    Flowsheet Row Admission (Discharged) from OP [facility] Admission (Discharged) from OP Visit from [facility]/10[facility] Admission (Discharged[facility] from OP Visit from [facility]  Alcohol Use Disorder Identification Test Final Score (AUDIT) 0 0 0   GAD-7    Flowsheet Row Video Visit from 05/01/2024 in [facility] Video Visit from 03/20/2024 in [facility] Video Visit from 01/17/2024 in [facility] Video Visit from 11/22/2023 in [facility] Video Visit from 10/09/2023 in [facility]  Total GAD-7 Score 16 14 11 9 10    PHQ2-9    Flowsheet Row Video Visit from 05/01/2024 in [facility] Video Visit from 03/20/2024 in [facility] Video Visit from 01/17/2024 in [facility] Video Visit from 11/22/2023 in [facility] Video Visit from 10/09/2023 in [facility]  PHQ-2 Total Score 2 6 2 1 1   PHQ-9 Total Score 10 20 8  -- --   Flowsheet Row Video Visit from 05/01/2024 in [facility] Video Visit from 03/20/2024 in [facility] Video Visit from 01/17/2024 in [facility]  C-SSRS RISK CATEGORY Moderate Risk Moderate Risk Moderate Risk     Assessment and Plan:   Rion Schnitzer is a 33 year old, Caucasian female with a past psychiatric history significant for chronic anxiety,  bipolar 1 disorder, and chronic PTSD who presents to  [facility] via virtual video visit for follow-up and medication management.  Patient presents to the encounter stating that she continues to take her medications regularly.  An aims assessment was performed with the patient scoring an 8.  Patient reports that she experiences restlessness in her upper and lower extremities as well as movements in her jaw.  Patient does not appear to be exhibiting any involuntary movements indicative of tardive dyskinesia.  It appears that patient's movements may be attributed to restlessness experienced from the use of one of her medications.  Provider most recently tapered patient's Abilify  dosage to determine if her restlessness would stop; however, patient's restlessness worsened.  Patient believes that the use of her escitalopram  is causing her to have heat intolerance.  She expresses concerns over the use of her SSRI since she will be going on vacation where she will be out in the sun a lot.  Patient reports that her depression is minimal at this time and that her biggest concerns is in regards to her anxiety.  Patient endorses several different stressors in her life at this time.  A PHQ-9 screen was performed with the patient scoring of 10.  A GAD-7 screen was also performed with the patient scoring a 16.  Patient current concerns are over the use of her escitalopram  causing heat intolerance, restlessness, and excessive vaping.  Provider informed patient that escitalopram  can cause restlessness/restless leg syndrome.  Due to her concerns over her SSRI causing heat intolerance during her vacation, provider recommended patient discontinue her use of the medication to see if her restlessness/restless leg stops and to avoid being affected by the heat intolerance.  Patient was agreeable to recommendation.  Provider also recommended patient be given Nicorette  gum to help decrease her  vaping.  Patient was agreeable to recommendation.  Patient to continue taking all other medications as prescribed.  Patient's medications to be e-prescribed to pharmacy of choice.  A [place] Suicide Severity Rating Scale was performed with the patient being considered moderate risk.  Patient denies suicidal ideations and is able to contract for safety following the conclusion of the encounter.  Collaboration of Care: Collaboration of Care: Medication Management AEB provider managing patient's psychiatric medications, Psychiatrist AEB patient being followed by mental health provider at this facility, and Referral or follow-up with counselor/therapist AEB patient being seen by a licensed clinical social worker at this facility  Patient/Guardian was advised Release of Information must be obtained prior to any record release in order to collaborate their care with an outside provider. Patient/Guardian was advised if they have not already done so to contact the registration department to sign all necessary forms in order for us  to release information regarding their care.   Consent: Patient/Guardian gives verbal consent for treatment and assignment of benefits for services provided during this visit. Patient/Guardian expressed understanding and agreed to proceed.   1. 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  Dispense: 100 tablet; Refill: 0  2. Long term current use of antipsychotic medication Labs pending EKG pending Patient to have labs drawn and EKG performed at primary care office  3. Bipolar 1 disorder (HCC)  - ARIPiprazole  (ABILIFY ) 20 MG tablet; Take 1 tablet (20 mg total) by mouth daily.  Dispense: 30 tablet; Refill: 1 - lamoTRIgine  (LAMICTAL ) 100 MG tablet; Take 1 tablet (100 mg total) by  mouth 2 (two) times daily.  Dispense: 60  tablet; Refill: 1  4. Chronic anxiety  - gabapentin  (NEURONTIN ) 300 MG capsule; Take 2 capsules (600 mg total) by mouth 3 (three) times daily.  Dispense: 180 capsule; Refill: 1  5. Insomnia due to other mental disorder  - ramelteon  (ROZEREM ) 8 MG tablet; Take 1 tablet (8 mg total) by mouth at bedtime.  Dispense: 30 tablet; Refill: 1  Patient to follow up in 7 weeks Provider spent a total of 27 minute with the patient/reviewing the patient's chart  Karen FORBES Bolster, PA 05/01/2024, 3:30 PM

## 2024-05-21 ENCOUNTER — Ambulatory Visit (HOSPITAL_COMMUNITY): Admitting: Licensed Clinical Social Worker

## 2024-05-21 DIAGNOSIS — F319 Bipolar disorder, unspecified: Secondary | ICD-10-CM | POA: Diagnosis not present
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
## 2024-06-08 ENCOUNTER — Ambulatory Visit (HOSPITAL_COMMUNITY): Admitting: Licensed Clinical Social Worker

## 2024-06-18 ENCOUNTER — Ambulatory Visit (INDEPENDENT_AMBULATORY_CARE_PROVIDER_SITE_OTHER): Admitting: Licensed Clinical Social Worker

## 2024-06-18 DIAGNOSIS — F319 Bipolar disorder, unspecified: Secondary | ICD-10-CM | POA: Diagnosis not present

## 2024-06-18 NOTE — Progress Notes (Signed)
 THERAPIST PROGRESS NOTE  Virtual Visit via Video Note  I connected with Karen [name] on 06/18/24 at 10:00 AM EDT by a video enabled telemedicine application and verified that I am speaking with the correct person using two identifiers.  Location: Patient: [name]  Provider: Providers Home    I discussed the limitations of evaluation and management by telemedicine and the availability of in person appointments. The patient expressed understanding and agreed to proceed.     I discussed the assessment and treatment plan with the patient. The patient was provided an opportunity to ask questions and all were answered. The patient agreed with the plan and demonstrated an understanding of the instructions.   The patient was advised to call back or seek an in-person evaluation if the symptoms worsen or if the condition fails to improve as anticipated.  I provided 55 minutes of non-face-to-face time during this encounter.   Karen GORMAN Patee, [name]   Participation Level: Active  Behavioral Response: CasualAlertAnxious and Depressed  Type of Therapy: Individual Therapy  Treatment Goals addressed:  Active     Bipolar Disorder CCP Problem  1 bipolar 1 disorder       walk 3 x weekly or workout  (Progressing)     Start:  03/07/22    Expected End:  09/04/24         maintain 40 hour a week employment  (Completed/Met)     Start:  03/07/22    Expected End:  10/04/22    Resolved:  08/08/22      LTG: Stabilize mood and increase goal-directed behavior: decrease PHQ-9 below 10  (Progressing)     Start:  03/07/22    Expected End:  09/04/24         STG: Karen [name] WILL COOPERATE WITH A PSYCHIATRIC EVALUATION ON 10/05/2020  AND DURING ALL FOLLOW-UP VISITS (Completed/Met)     Start:  03/07/22    Expected End:  10/04/22    Resolved:  03/26/22      STG: Karen [name] WILL IDENTIFY COGNITIVE PATTERNS AND BELIEFS THAT INTERFERE WITH THERAPY (Progressing)     Start:  03/07/22     Expected End:  09/04/24          decrease GAD-7 below 5  (Progressing)     Start:  03/07/22    Expected End:  09/04/24            WORK WITH Karen [name] TO DISCUSS RISKS AND BENEFITS OF MEDICATION TREATMENT OPTIONS FOR THIS PROBLEM AND PRESCRIBE AS INDICATED (Completed)     Start:  03/07/22    End:  03/14/22      Conduct a review of all medications to evaluate any drug/drug interactions (Completed)     Start:  03/07/22    End:  03/14/22      REVIEW WITH Karen [name] THEIR RESPONSE TO THE PRESCRIBED MEDICATION, INCLUDING ANY SIDE EFFECTS (Completed)     Start:  03/07/22    End:  03/14/22      ENCOURAGE Karen [name] TO KEEP A LOG OF MEDICATION SIDE EFFECTS, QUESTIONS REGARDING MEDICATION, AND SYMPTOMS (Completed)     Start:  03/07/22    End:  03/26/22      INSTRUCT Karen [name] TO TAKE PSYCHOTROPIC MEDICATION AS PRESCRIBED (Completed)     Start:  03/07/22    End:  03/14/22      INSTRUCT Karen [name] TO COMMUNICATE EFFECTS OF PRESCRIBED MEDICATIONS (Completed)     Start:  03/07/22    End:  03/26/22  EDUCATE Karen [name] ON BIPOLAR DISORDER DIAGNOSTIC CRITERIA (Completed)     Start:  03/07/22    End:  03/14/22      WORK WITH Karen [name] TO DEVELOP A LIST OF AT LEAST 1 CONSEQUENCES OF UNTREATED BIPOLAR SYMPTOMS (Completed)     Start:  03/07/22    End:  03/14/22         ProgressTowards Goals: Progressing  Interventions: CBT, Motivational Interviewing, and Supportive   Suicidal/Homicidal: Nowithout intent/plan  Therapist Response:    Karen [name] was alert and oriented x 5.  Karen [name] was pleasant, cooperative, maintained good eye contact.  Karen [name] engaged well in therapy session and was dressed casually.  Patient presented with anxious mood\affect.  Stressors for patient are her son Karen [name] for behavioral issues.  Karen [name] has been advocating for her son as evidenced by him starting intensive in-home therapy as well as being referred to a psychiatrist for his behavioral issues at home.  Patient reports that  when he gets overwhelmed he tends to be destructive with property.  Karen [name] states that Karen [name] has tried behavioral modifications but it has work on a limited basis over the summer.  Karen [name] is hopeful with the intensive in-home therapy as well as medication management behaviors should improve.  Karen [name] reports self-neglect as evidenced by not prioritizing herself or her self-care.  Patient reports that Karen [name] feels selfish if Karen [name] is not do things for other people.  Patient notes that Karen [name] severely underestimated moving in with a partner with a child and integrating everyone schedule.  Karen [name] has been feeling overwhelmed between to first grade children, new engagement, wedding planning, and a new puppy.  Intervention/plan: [name] utilized psychoanalytic therapy for patient to express thoughts, feelings and emotions and nonjudgmental environment.  [name] utilized supportive therapy for praise and encouragement.  [name] validated feelings in session.  [name] utilize open-ended questions.  [name] educated patient on the use of organization such as the utilization of a planner.  [name] educated patient on prioritization of self as evidenced by setting healthy boundaries.  Examples provided is to normalizing now.    Plan: Return again in 8 weeks.  Diagnosis: Bipolar 1 disorder (HCC)  Collaboration of Care: Other Continued individual therapy   Patient/Guardian was advised Release of Information must be obtained prior to any record release in order to collaborate their care with an outside provider. Patient/Guardian was advised if they have not already done so to contact the registration department to sign all necessary forms in order for us  to release information regarding their care.   Consent: Patient/Guardian gives verbal consent for treatment and assignment of benefits for services provided during this visit. Patient/Guardian expressed understanding and agreed to proceed.   Karen GORMAN Patee, [name] 06/18/2024

## 2024-06-26 ENCOUNTER — Telehealth (INDEPENDENT_AMBULATORY_CARE_PROVIDER_SITE_OTHER): Payer: Self-pay | Admitting: Physician Assistant

## 2024-06-26 ENCOUNTER — Encounter (HOSPITAL_COMMUNITY): Payer: Self-pay | Admitting: Physician Assistant

## 2024-06-26 DIAGNOSIS — F5105 Insomnia due to other mental disorder: Secondary | ICD-10-CM

## 2024-06-26 DIAGNOSIS — F419 Anxiety disorder, unspecified: Secondary | ICD-10-CM

## 2024-06-26 DIAGNOSIS — F99 Mental disorder, not otherwise specified: Secondary | ICD-10-CM

## 2024-06-26 DIAGNOSIS — Z79899 Other long term (current) drug therapy: Secondary | ICD-10-CM

## 2024-06-26 DIAGNOSIS — F319 Bipolar disorder, unspecified: Secondary | ICD-10-CM

## 2024-06-26 MED ORDER — ARIPIPRAZOLE 20 MG PO TABS
20.0000 mg | ORAL_TABLET | Freq: Every day | ORAL | 1 refills | Status: DC
Start: 2024-06-26 — End: 2024-06-26

## 2024-06-26 MED ORDER — ESCITALOPRAM OXALATE 10 MG PO TABS: 10.0000 mg | ORAL_TABLET | Freq: Every day | ORAL | 1 refills | Status: DC

## 2024-06-26 MED ORDER — ESCITALOPRAM OXALATE 5 MG PO TABS: ORAL_TABLET | ORAL | 0 refills | Status: DC

## 2024-06-26 MED ORDER — HYDROXYZINE HCL 25 MG PO TABS: 25.0000 mg | ORAL_TABLET | Freq: Three times a day (TID) | ORAL | 1 refills | Status: DC | PRN

## 2024-06-26 MED ORDER — RAMELTEON 8 MG PO TABS: 8.0000 mg | ORAL_TABLET | Freq: Every day | ORAL | 1 refills | Status: DC

## 2024-06-26 MED ORDER — GABAPENTIN 300 MG PO CAPS: 600.0000 mg | ORAL_CAPSULE | Freq: Three times a day (TID) | ORAL | 1 refills | Status: DC

## 2024-06-26 MED ORDER — LAMOTRIGINE 100 MG PO TABS: 100.0000 mg | ORAL_TABLET | Freq: Two times a day (BID) | ORAL | 1 refills | Status: DC

## 2024-06-26 NOTE — Progress Notes (Signed)
 [facility] MD/PA/NP OP Progress Note  Virtual Visit via Video Note  I connected with Karen [name] on 06/26/24 at  4:00 PM EDT by a video enabled telemedicine application and verified that I am speaking with the correct person using two identifiers.  Location: Patient: Home Provider: Clinic   I discussed the limitations of evaluation and management by telemedicine and the availability of in person appointments. The patient expressed understanding and agreed to proceed.  Follow Up Instructions:  I discussed the assessment and treatment plan with the patient. The patient was provided an opportunity to ask questions and all were answered. The patient agreed with the plan and demonstrated an understanding of the instructions.   The patient was advised to call back or seek an in-person evaluation if the symptoms worsen or if the condition fails to improve as anticipated.  I provided 31 minutes of non-face-to-face time during this encounter.  [name], PA    06/26/2024 4:00 PM Lyndsee Casa  MRN:  [id number]  Chief Complaint:  No chief complaint on file.  HPI:   Karen [name] is a 33 year old,  Caucasian female with a past psychiatric history significant for chronic anxiety, bipolar 1 disorder, and chronic PTSD who presents to [facility] via virtual video visit for follow-up and medication management.  Patient is currently being managed on the following psychiatric medications:  Ramelteon  8 mg at bedtime Gabapentin  600 mg 3 times daily Lamotrigine  100 mg 2 times daily Abilify  20 mg daily Escitalopram  5 mg daily  Patient presents to the encounter stating that she is stressed out due to circumstances in her life.  Since discontinuing her Lexapro , patient has noticed a huge difference in her mood.  Patient notes that she has been more irritable and emotional lately.  She also endorses having a lot of anxiety.  She reports that her  shaking has also gotten worse especially when anxious.  She reports that she experiences shaking in her hands and arms as if she were having a tremor.  Patient endorses depression attributed to her irritability.  She reports that things are not as enjoyable because everything irritates her.  Patient rates her depression a 4 out of 10 with 10 being most severe.  Patient endorses depressive episodes 2 to 3 days out of the week.  Patient endorses the following depressive symptoms: feelings of sadness, lack of motivation, decreased concentration, decreased energy, and irritability.  Patient denies feelings of guilt/worthlessness or hopelessness.  A PHQ-9 screening was performed with the patient scoring an 8.  A GAD-7 screen was also performed with the patient scoring a 21.  Patient is alert and oriented x4, pleasant, calm, cooperative, and fully engaged in conversation during the encounter.  Patient endorses anxious mood.  Patient exhibits depressed and anxious mood with congruent affect.  Patient denies suicidal or homicidal ideations.  She further denies auditory or visual hallucinations and does not appear to be responding to internal/external stimuli.  Patient endorses good sleep and receives on average 8 hours of sleep per night.  Patient endorses good appetite and eats on average 2-3 meals per day.  Patient denies alcohol consumption, tobacco use, or illicit drug use.  Visit Diagnosis:    ICD-10-CM   1. Bipolar 1 disorder (HCC)  F31.9 lamoTRIgine  (LAMICTAL ) 100 MG tablet    ARIPiprazole  (ABILIFY ) 20 MG tablet    2. Chronic anxiety  F41.9 hydrOXYzine  (ATARAX ) 25 MG tablet    escitalopram  (LEXAPRO ) 5 MG tablet  escitalopram  (LEXAPRO ) 10 MG tablet    gabapentin  (NEURONTIN ) 300 MG capsule    3. Insomnia due to other mental disorder  F51.05 ramelteon  (ROZEREM ) 8 MG tablet   F99       Past Psychiatric History:  Bipolar disorder Generalized anxiety disorder Visual hallucinations Chronic  PTSD  Past Medical History:  Past Medical History:  Diagnosis Date   Anxiety    Bipolar disorder (HCC)    Chronic UTI    Depression    Endometriosis    HSV (herpes simplex virus) anogenital infection    PTSD (post-traumatic stress disorder)     Past Surgical History:  Procedure Laterality Date   CESAREAN SECTION N/A 02/17/2018   Procedure: CESAREAN SECTION;  Surgeon: Bettina Muskrat, MD;  Location: [facility];  Service: Obstetrics;  Laterality: N/A;   COLONOSCOPY     DIAGNOSTIC LAPAROSCOPY  11/05/2012   DILATION AND EVACUATION N/A 03/11/2015   Procedure: DILATATION AND EVACUATION;  Surgeon: Jerolyn Foil, MD;  Location: [facility] ORS;  Service: Gynecology;  Laterality: N/A;   DRUG INDUCED ENDOSCOPY     LAPAROSCOPY     LAPAROSCOPY N/A 06/07/2021   Procedure: LAPAROSCOPY OPERATIVE with /EXCISION OF LESIONS and lysis of adhesions;  Surgeon: Foil Jerolyn, MD;  Location: [facility] OR;  Service: Gynecology;  Laterality: N/A;   TONSILLECTOMY  11/05/2013    Family Psychiatric History:  Mother - Major Depressive Disorder Sister - Obsessive Compulsive Disorder Aunt (maternal) - Bipolar I Disorder Uncle (maternal) - Bipolar I Disorder  Family History:  Family History  Problem Relation Age of Onset   Rheum arthritis Mother    Cancer Father     Social History:  Social History   Socioeconomic History   Marital status: Married    Spouse name: Cooper Stamp   Number of children: 1   Years of education: Not on file   Highest education level: Some college, no degree  Occupational History   Not on file  Tobacco Use   Smoking status: Every Day    Types: E-cigarettes   Smokeless tobacco: Never   Tobacco comments:    PT VAPES DAILY, '' I vape a lot ''   Vaping Use   Vaping status: Every Day   Substances: Nicotine , Flavoring  Substance and Sexual Activity   Alcohol use: Not Currently   Drug use: Not Currently    Types: Heroin, Cocaine, Marijuana    Comment: Clean since Aug 2015   Sexual  activity: Yes    Partners: Male  Other Topics Concern   Not on file  Social History Narrative   Not on file   Social Drivers of Health   Financial Resource Strain: Low Risk  (11/08/2023)   Received from [facility]   Overall Financial Resource Strain (CARDIA)    Difficulty of Paying Living Expenses: Not hard at all  Food Insecurity: No Food Insecurity (11/08/2023)   Received from [facility]   Hunger Vital Sign    Within the past 12 months, you worried that your food would run out before you got the money to buy more.: Never true    Within the past 12 months, the food you bought just didn't last and you didn't have money to get more.: Never true  Transportation Needs: No Transportation Needs (11/08/2023)   Received from [facility] - Transportation    Lack of Transportation (Medical): No    Lack of Transportation (Non-Medical): No  Physical Activity: Insufficiently Active (11/08/2023)   Received from  [facility]   Exercise Vital Sign    On average, how many days per week do you engage in moderate to strenuous exercise (like a brisk walk)?: 1 day    On average, how many minutes do you engage in exercise at this level?: 60 min  Stress: Stress Concern Present (11/08/2023)   Received from [facility] - Occupational Stress Questionnaire    Feeling of Stress : To some extent  Social Connections: Socially Integrated (11/08/2023)   Received from [facility]   Social Network    How would you rate your social network (family, work, friends)?: Good participation with social networks    Allergies:  Allergies  Allergen Reactions   Ciprofloxacin Hives   Wound Dressing Adhesive Rash    Metabolic Disorder Labs: Lab Results  Component Value Date   HGBA1C 4.6 (L) 07/02/2022   MPG 85.32 07/02/2022   MPG 85.32 09/11/2020   No results found for: PROLACTIN Lab Results  Component Value Date   CHOL 117 07/02/2022   TRIG 94  07/02/2022   HDL 29 (L) 07/02/2022   CHOLHDL 4.0 07/02/2022   VLDL 19 07/02/2022   LDLCALC 69 07/02/2022   LDLCALC 78 05/09/2021   Lab Results  Component Value Date   TSH 0.519 07/02/2022   TSH 0.784 09/11/2020    Therapeutic Level Labs: Lab Results  Component Value Date   LITHIUM  0.27 (L) 07/06/2022   No results found for: VALPROATE No results found for: CBMZ  Current Medications: Current Outpatient Medications  Medication Sig Dispense Refill   escitalopram  (LEXAPRO ) 10 MG tablet Take 1 tablet (10 mg total) by mouth daily. 30 tablet 1   acetaminophen  (TYLENOL ) 325 MG tablet Take 2 tablets (650 mg total) by mouth every 6 (six) hours as needed for mild pain or moderate pain. 30 tablet 1   ARIPiprazole  (ABILIFY ) 20 MG tablet Take 1 tablet (20 mg total) by mouth daily. 30 tablet 1   benztropine  (COGENTIN ) 0.5 MG tablet TAKE 1 TABLET BY MOUTH EVERY DAY 90 tablet 1   clonazePAM  (KLONOPIN ) 0.5 MG tablet Take 1 tablet (0.5 mg total) by mouth daily as needed for anxiety. 30 tablet 0   escitalopram  (LEXAPRO ) 5 MG tablet Patient to take Lexapro  5 mg for 6 days then continue taking 10 mg daily. 6 tablet 0   gabapentin  (NEURONTIN ) 300 MG capsule Take 2 capsules (600 mg total) by mouth 3 (three) times daily. 180 capsule 1   hydrOXYzine  (ATARAX ) 25 MG tablet Take 1 tablet (25 mg total) by mouth 3 (three) times daily as needed for anxiety. 75 tablet 1   lamoTRIgine  (LAMICTAL ) 100 MG tablet Take 1 tablet (100 mg total) by mouth 2 (two) times daily. 60 tablet 1   medroxyPROGESTERone  (DEPO-PROVERA ) 150 MG/ML injection Inject 150 mg into the muscle every 3 (three) months. Next dose due 07/03/22     Multiple Vitamins-Minerals (MULTIVITAMIN WITH MINERALS) tablet Take 1 tablet by mouth daily.     nicotine  polacrilex (NICORETTE ) 2 MG gum Take 1 each (2 mg total) by mouth as needed for smoking cessation. 100 tablet 0   promethazine  (PHENERGAN ) 25 MG tablet Take 1 tablet (25 mg total) by mouth every 6  (six) hours as needed for nausea or vomiting. 20 tablet 0   ramelteon  (ROZEREM ) 8 MG tablet Take 1 tablet (8 mg total) by mouth at bedtime. 30 tablet 1   No current facility-administered medications for this visit.  Musculoskeletal: Strength & Muscle Tone: within normal limits Gait & Station: normal Patient leans: N/A  Psychiatric Specialty Exam: Review of Systems  Psychiatric/Behavioral:  Positive for dysphoric mood and sleep disturbance. Negative for decreased concentration, hallucinations, self-injury and suicidal ideas. The patient is nervous/anxious. The patient is not hyperactive.     There were no vitals taken for this visit.There is no height or weight on file to calculate BMI.  General Appearance: Casual  Eye Contact:  Good  Speech:  Clear and Coherent and Normal Rate  Volume:  Normal  Mood:  Anxious and Depressed  Affect:  Congruent  Thought Process:  Coherent, Goal Directed, and Descriptions of Associations: Intact  Orientation:  Full (Time, Place, and Person)  Thought Content: WDL   Suicidal Thoughts:  No  Homicidal Thoughts:  No  Memory:  Immediate;   Good Recent;   Good Remote;   Good  Judgement:  Good  Insight:  Good  Psychomotor Activity:  Normal  Concentration:  Concentration: Good and Attention Span: Good  Recall:  Good  Fund of Knowledge: Good  Language: Good  Akathisia:  Yes  Handed:  Ambidextrous  AIMS (if indicated): not done  Assets:  Communication Skills Desire for Improvement Housing Social Support Vocational/Educational  ADL's:  Intact  Cognition: WNL  Sleep:  Fair   Screenings: AIMS    Flowsheet Row Video Visit from 06/26/2024 in [facility] Video Visit from 05/01/2024 in [facility] Video Visit from 03/20/2024 in [facility] Ad[facility] (Discharged) from OP [facility] ([facility] from  [facility]  AIMS Total Score 6 8 9  0 0   AUDIT    Flowsheet Row Admission (Discharged) from OP [facility] (Discharged) from OP Visit [facility] (Dis[facility] from [facility]  Alcohol Use Disorder Identification Test Final Score (AUDIT) 0 0 0   GAD-7    Flowsheet Row Video Visit from 06/26/2024 in [facility] Video Visit from 05/01/2024 in [facility] Video Visit from 03/20/2024 in [facility] Video Visit from 01/17/2024 in [facility] Video Visit from 11/22/2023 in [facility]  Total GAD-7 Score 21 16 14 11 9    PHQ2-9    Flowsheet Row Video Visit from 06/26/2024 in [facility] Video Visit from 05/01/2024 in [facility] Video Visit from 03/20/2024 in [facility] Video Visit from 01/17/2024 in [facility] Video Visit from 11/22/2023 in [facility]  PHQ-2 Total Score 2 2 6 2 1   PHQ-9 Total Score 8 10 20 8  --   Flowsheet Row Video Visit from 06/26/2024 in [facility] Video Visit from 05/01/2024 in [facility] Video Visit from 03/20/2024 in [facility]  C-SSRS RISK CATEGORY Moderate Risk Moderate Risk Moderate Risk     Assessment and Plan:   Alizay Bronkema is a 33 year old, Caucasian female with a past psychiatric history significant for chronic anxiety, bipolar 1 disorder, and chronic PTSD who presents to [facility] via virtual video visit for  follow-up and  medication management.  Patient presents to the encounter stating that her mood and anxiety have worsened since discontinuing Lexapro .  She notes that she has been more irritable and emotional lately.  Patient also reports that she has been experiencing shaking in her hands, arms, and legs as if she has a tremor.  An aims assessment was performed with the patient scoring a 6.  Patient reports that she experiences shaking in her hands, arms, and legs when she is extremely anxious.  She reports that the shaking in her hands can be so severe that she is unable to write.  She reports that other people around her have noticed the shaking as well.  It is uncertain if patient's shaking is attributed to anxiety or her use of Abilify .  If this is true tardive dyskinesia, then patient's tremor may still be present even with the discontinuation of Abilify .  Provider to consider placing patient on Ingrezza or Austedo to determine if shaking is due to tardive dyskinesia.  Patient also continues to endorse worsening mood that seems to have worsened since discontinuing her Lexapro .  Provider recommended patient to continue taking Lexapro  5 mg for 6 days, followed by 10 mg daily for the management of her depressive symptoms and anxiety.  Provider recommended patient be placed on Caplyta for mood stability.  Provider informed patient that samples could be provided to her.  Provider informed patient that once she were able to get the Caplyta prescription, she would need to taper off Abilify .  Patient vocalized understanding.  Patient was instructed to continue taking Abilify  until she is able to receive Caplyta.  Patient vocalized understanding.  Patient informed provider that she found an old prescription of hydroxyzine  and has been using it for the management of her anxiety.  Provider to place patient on hydroxyzine  25 mg 3 times daily as needed for the management of her anxiety.  Patient vocalized  understanding.  Patient's medications to be e-prescribed to pharmacy of choice.  A [place] Suicide Severity Rating Scale was performed with the patient being considered moderate risk.  Patient denies suicidal ideations and is able to contract for safety at this time.  Safety planning was discussed with the patient prior to the conclusion of the encounter.  - Patient was instructed to contact 911 in the event of a mental health crisis. - Patient was instructed to contact [facility] in the event of a mental health crisis. - Patient was instructed to present to [facility] in the event of a mental health crisis.  Collaboration of Care: Collaboration of Care: Medication Management AEB provider managing patient's psychiatric medications, Psychiatrist AEB patient being followed by mental health provider at this facility, and Referral or follow-up with counselor/therapist AEB patient being seen by a licensed clinical social worker at this facility  Patient/Guardian was advised Release of Information must be obtained prior to any record release in order to collaborate their care with an outside provider. Patient/Guardian was advised if they have not already done so to contact the registration department to sign all necessary forms in order for us  to release information regarding their care.   Consent: Patient/Guardian gives verbal consent for treatment and assignment of benefits for services provided during this visit. Patient/Guardian expressed understanding and agreed to proceed.   1. Bipolar 1 disorder (HCC) Patient to be started on Caplyta 42 mg daily for mood stability.  Provider to provide patient with samples. Once patient has samples of the  medication, patient to taper off Abilify  while taking Caplyta.  - lamoTRIgine  (LAMICTAL ) 100 MG tablet; Take 1 tablet (100 mg total) by mouth 2 (two) times daily.  Dispense: 60 tablet; Refill: 1  2.  Chronic anxiety  - hydrOXYzine  (ATARAX ) 25 MG tablet; Take 1 tablet (25 mg total) by mouth 3 (three) times daily as needed for anxiety.  Dispense: 75 tablet; Refill: 1 - escitalopram  (LEXAPRO ) 5 MG tablet; Patient to take Lexapro  5 mg for 6 days then continue taking 10 mg daily.  Dispense: 6 tablet; Refill: 0 - escitalopram  (LEXAPRO ) 10 MG tablet; Take 1 tablet (10 mg total) by mouth daily.  Dispense: 30 tablet; Refill: 1 - gabapentin  (NEURONTIN ) 300 MG capsule; Take 2 capsules (600 mg total) by mouth 3 (three) times daily.  Dispense: 180 capsule; Refill: 1  3. Insomnia due to other mental disorder  - ramelteon  (ROZEREM ) 8 MG tablet; Take 1 tablet (8 mg total) by mouth at bedtime.  Dispense: 30 tablet; Refill: 1  4. Long term current use of antipsychotic medication (Primary) Labs pending EKG pending Patient to have labs drawn and EKG performed at primary care office  Patient to follow up in 6 weeks Provider spent a total of 31 minute with the patient/reviewing the patient's chart  [name], PA 06/26/2024, 4:00 PM

## 2024-08-07 ENCOUNTER — Telehealth (HOSPITAL_COMMUNITY): Admitting: Physician Assistant

## 2024-08-07 ENCOUNTER — Encounter (HOSPITAL_COMMUNITY): Payer: Self-pay | Admitting: Physician Assistant

## 2024-08-07 DIAGNOSIS — F99 Mental disorder, not otherwise specified: Secondary | ICD-10-CM

## 2024-08-07 DIAGNOSIS — T50905A Adverse effect of unspecified drugs, medicaments and biological substances, initial encounter: Secondary | ICD-10-CM

## 2024-08-07 DIAGNOSIS — R112 Nausea with vomiting, unspecified: Secondary | ICD-10-CM

## 2024-08-07 DIAGNOSIS — G2589 Other specified extrapyramidal and movement disorders: Secondary | ICD-10-CM | POA: Diagnosis not present

## 2024-08-07 DIAGNOSIS — G2581 Restless legs syndrome: Secondary | ICD-10-CM | POA: Diagnosis not present

## 2024-08-07 DIAGNOSIS — F5105 Insomnia due to other mental disorder: Secondary | ICD-10-CM

## 2024-08-07 DIAGNOSIS — Z79899 Other long term (current) drug therapy: Secondary | ICD-10-CM

## 2024-08-07 DIAGNOSIS — F419 Anxiety disorder, unspecified: Secondary | ICD-10-CM | POA: Diagnosis not present

## 2024-08-07 DIAGNOSIS — F319 Bipolar disorder, unspecified: Secondary | ICD-10-CM

## 2024-08-07 MED ORDER — HYDROXYZINE HCL 25 MG PO TABS: 25.0000 mg | ORAL_TABLET | Freq: Three times a day (TID) | ORAL | 1 refills | Status: DC | PRN

## 2024-08-07 MED ORDER — ESCITALOPRAM OXALATE 10 MG PO TABS: 10.0000 mg | ORAL_TABLET | Freq: Every day | ORAL | 1 refills | Status: AC

## 2024-08-07 MED ORDER — LAMOTRIGINE 100 MG PO TABS
100.0000 mg | ORAL_TABLET | Freq: Two times a day (BID) | ORAL | 1 refills | Status: DC
Start: 2024-08-07 — End: 2024-09-25

## 2024-08-07 MED ORDER — GABAPENTIN 300 MG PO CAPS: 600.0000 mg | ORAL_CAPSULE | Freq: Three times a day (TID) | ORAL | 1 refills | Status: DC

## 2024-08-07 MED ORDER — RAMELTEON 8 MG PO TABS: 8.0000 mg | ORAL_TABLET | Freq: Every day | ORAL | 1 refills | Status: AC

## 2024-08-07 MED ORDER — ARIPIPRAZOLE 5 MG PO TABS: ORAL_TABLET | ORAL | 0 refills | Status: AC

## 2024-08-07 MED ORDER — ONDANSETRON HCL 4 MG PO TABS: 4.0000 mg | ORAL_TABLET | ORAL | 0 refills | Status: AC | PRN

## 2024-08-07 MED ORDER — ARIPIPRAZOLE 20 MG PO TABS: 20.0000 mg | ORAL_TABLET | Freq: Every day | ORAL | 1 refills | Status: DC

## 2024-08-07 MED ORDER — ROPINIROLE HCL 0.25 MG PO TABS: 0.2500 mg | ORAL_TABLET | Freq: Every evening | ORAL | 1 refills | Status: DC

## 2024-08-07 NOTE — Progress Notes (Signed)
 [facility] MD/PA/NP OP Progress Note  Virtual Visit via Video Note  I connected with Karen [name] on 08/07/24 at  4:00 PM EDT by a video enabled telemedicine application and verified that I am speaking with the correct person using two identifiers.  Location: Patient: Home Provider: Clinic   I discussed the limitations of evaluation and management by telemedicine and the availability of in person appointments. The patient expressed understanding and agreed to proceed.  Follow Up Instructions:  I discussed the assessment and treatment plan with the patient. The patient was provided an opportunity to ask questions and all were answered. The patient agreed with the plan and demonstrated an understanding of the instructions.   The patient was advised to call back or seek an in-person evaluation if the symptoms worsen or if the condition fails to improve as anticipated.  I provided 21 minutes of non-face-to-face time during this encounter.  [name], PA    08/07/2024 4:00 PM Karen [name]  MRN:  [id number]  Chief Complaint:  No chief complaint on file.  HPI:   Karen [name] is a 33 year old, Caucasian female with a past psychiatric history significant for chronic anxiety, bipolar 1 disorder, and chronic PTSD who presents to [facility] via virtual video visit for follow-up and medication management.  Patient is currently being managed on the following psychiatric medications:  Ramelteon  8 mg at bedtime Gabapentin  600 mg 3 times daily Lamotrigine  100 mg 2 times daily Caplyta 42 mg daily Escitalopram  10 mg daily Hydroxyzine  25 mg 2 times daily as needed  Since the last encounter, patient reports that she has been extremely depressed and denies any contributing factors to her depression.  Since struggling with her ongoing depression, patient reports that she refuses to eat which causes her to feel nauseated.  For the past 48 hours,  patient reports that she has been unable to keep her medications down.  Prior to this encounter, patient reports that she developed a bad allergic reaction to Caplyta characterized by the feeling that her skin was on fire, hives, headache, and extreme sedation.  Patient has since discontinued her use of Caplyta.  Patient also reports that her sleep schedule has been messed up due to her restless legs that she has been experiencing.  Due to not receiving any sleep at night, patient reports that she has been sleeping during the day.  Patient reports that she occasionally experiences restless legs throughout the day and states that her symptoms occur at night.  Patient rates her depression an 8 out of 10 with 10 being most severe.  Despite her depression, patient denies suicidal ideations.  Patient endorses depressive episodes every day stating that she often does not leave the house unless taking her children to school.  Patient endorses the following depressive symptoms: feelings of sadness, self-isolation, lack of motivation, and lack of interest in activities or hobbies.  Patient also endorses worsening anxiety and rates her anxiety an 8 out of 10.  Patient's main stressor revolves around her son going through a major mental health crisis.  PHQ-9 screen was performed with the patient scoring a 20.  A GAD-7 screen was also performed the patient scoring a 15.  Patient is alert and oriented x4, calm, cooperative, and fully engaged in conversation during the encounter.  Patient endorses low mood.  Patient exhibits depressed mood with congruent affect.  Patient denies suicidal or homicidal ideations.  She further denies auditory or visual hallucinations and  does not appear to be responding to internal/external stimuli.  Patient endorses poor sleep and receives roughly 2 hours of sleep at night.  She reports that she often sleeps 4 to 8 hours during the day.  Patient endorses poor appetite stating that she has been  unable to keep her food or medication down.  Patient denies alcohol consumption or illicit drug use.  Patient denies tobacco use but does engage in vaping.  Visit Diagnosis:    ICD-10-CM   1. Restless leg syndrome  G25.81 rOPINIRole (REQUIP) 0.25 MG tablet    2. 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 Chronic anxiety  F41.9 escitalopram  (LEXAPRO ) 10 MG tablet    hydrOXYzine  (ATARAX ) 25 MG tablet    gabapentin  (NEURONTIN ) 300 MG capsule    4. Bipolar 1 disorder (HCC)  F31.9 lamoTRIgine  (LAMICTAL ) 100 MG tablet    ARIPiprazole  (ABILIFY ) 5 MG tablet    ARIPiprazole  (ABILIFY ) 20 MG tablet    5. Insomnia due to other mental disorder  F51.05 ramelteon  (ROZEREM ) 8 MG tablet   F99     6. Nausea and vomiting, unspecified vomiting type  R11.2 ondansetron  (ZOFRAN ) 4 MG tablet      Past Psychiatric History:  Bipolar disorder Generalized anxiety disorder Visual hallucinations Chronic PTSD  Past Medical History:  Past Medical History:  Diagnosis Date   Anxiety    Bipolar disorder (HCC)    Chronic UTI    Depression    Endometriosis    HSV (herpes simplex virus) anogenital infection    PTSD (post-traumatic stress disorder)     Past Surgical History:  Procedure Laterality Date   CESAREAN SECTION N/A 02/17/2018   Procedure: CESAREAN SECTION;  Surgeon: Bettina Muskrat, MD;  Location: [facility];  Service: Obstetrics;  Laterality: N/A;   COLONOSCOPY     DIAGNOSTIC LAPAROSCOPY  11/05/2012   DILATION AND EVACUATION N/A 03/11/2015   Procedure: DILATATION AND EVACUATION;  Surgeon: Jerolyn Foil, MD;  Location: [facility] ORS;  Service: Gynecology;  Laterality: N/A;   DRUG INDUCED ENDOSCOPY     LAPAROSCOPY     LAPAROSCOPY N/A 06/07/2021   Procedure: LAPAROSCOPY OPERATIVE with /EXCISION OF LESIONS and lysis of adhesions;  Surgeon: Foil Jerolyn, MD;  Location: [facility] OR;  Service: Gynecology;  Laterality: N/A;   TONSILLECTOMY  11/05/2013    Family Psychiatric History:   Mother - Major Depressive Disorder Sister - Obsessive Compulsive Disorder Aunt (maternal) - Bipolar I Disorder Uncle (maternal) - Bipolar I Disorder  Family History:  Family History  Problem Relation Age of Onset   Rheum arthritis Mother    Cancer Father     Social History:  Social History   Socioeconomic History   Marital status: Married    Spouse name: Shantella Blubaugh   Number of children: 1   Years of education: Not on file   Highest education level: Some college, no degree  Occupational History   Not on file  Tobacco Use   Smoking status: Every Day    Types: E-cigarettes   Smokeless tobacco: Never   Tobacco comments:    PT VAPES DAILY, '' I vape a lot ''   Vaping Use   Vaping status: Every Day   Substances: Nicotine , Flavoring  Substance and Sexual Activity   Alcohol use: Not Currently   Drug use: Not Currently    Types: Heroin, Cocaine, Marijuana    Comment: Clean since Aug 2015   Sexual activity: Yes  Partners: Male  Other Topics Concern   Not on file  Social History Narrative   Not on file   Social Drivers of Health   Financial Resource Strain: Low Risk  (11/08/2023)   [facility]   Overall Financial Resource Strain (CARDIA)    Difficulty of Paying Living Expenses: Not hard at all  Food Insecurity: No Food Insecurity (11/08/2023)   Received from [facility]   Hunger Vital Sign    Within the past 12 months, you worried that your food would run out before you got the money to buy more.: Never true    Within the past 12 months, the food you bought just didn't last and you didn't have money to get more.: Never true  Transportation Needs: No Transportation Needs (11/08/2023)   Received from [facility] - Transportation    Lack of Transportation (Medical): No    Lack of Transportation (Non-Medical): No  Physical Activity: Insufficiently Active (11/08/2023)   Received from [facility]   Exercise Vital Sign    On average, how many  days per week do you engage in moderate to strenuous exercise (like a brisk walk)?: 1 day    On average, how many minutes do you engage in exercise at this level?: 60 min  Stress: Stress Concern Present (11/08/2023)   Received from [facility] - Occupational Stress Questionnaire    Feeling of Stress : To some extent  Social Connections: Socially Integrated (11/08/2023)   Received from [facility]   Social Network    How would you rate your social network (family, work, friends)?: Good participation with social networks    Allergies:  Allergies  Allergen Reactions   Ciprofloxacin Hives   Wound Dressing Adhesive Rash    Metabolic Disorder Labs: Lab Results  Component Value Date   HGBA1C 4.6 (L) 07/02/2022   MPG 85.32 07/02/2022   MPG 85.32 09/11/2020   No results found for: PROLACTIN Lab Results  Component Value Date   CHOL 117 07/02/2022   TRIG 94 07/02/2022   HDL 29 (L) 07/02/2022   CHOLHDL 4.0 07/02/2022   VLDL 19 07/02/2022   LDLCALC 69 07/02/2022   LDLCALC 78 05/09/2021   Lab Results  Component Value Date   TSH 0.519 07/02/2022   TSH 0.784 09/11/2020    Therapeutic Level Labs: Lab Results  Component Value Date   LITHIUM  0.27 (L) 07/06/2022   No results found for: VALPROATE No results found for: CBMZ  Current Medications: Current Outpatient Medications  Medication Sig Dispense Refill   ARIPiprazole  (ABILIFY ) 20 MG tablet Take 1 tablet (20 mg total) by mouth daily. 30 tablet 1   ARIPiprazole  (ABILIFY ) 5 MG tablet Take 1 tablet (5 mg total) by mouth daily for 6 days, THEN 2 tablets (10 mg total) daily for 6 days. 18 tablet 0   ondansetron  (ZOFRAN ) 4 MG tablet Take 1 tablet (4 mg total) by mouth as needed for nausea or vomiting. 30 tablet 0   rOPINIRole (REQUIP) 0.25 MG tablet Take 1 tablet (0.25 mg total) by mouth every evening. 30 tablet 1   acetaminophen  (TYLENOL ) 325 MG tablet Take 2 tablets (650 mg total)  by mouth every 6 (six) hours as needed for mild pain or moderate pain. 30 tablet 1   benztropine  (COGENTIN ) 0.5 MG tablet TAKE 1 TABLET BY MOUTH EVERY DAY 90 tablet 1   clonazePAM  (KLONOPIN ) 0.5 MG tablet Take 1 tablet (0.5 mg  total) by mouth daily as needed for anxiety. 30 tablet 0   escitalopram  (LEXAPRO ) 10 MG tablet Take 1 tablet (10 mg total) by mouth daily. 30 tablet 1   gabapentin  (NEURONTIN ) 300 MG capsule Take 2 capsules (600 mg total) by mouth 3 (three) times daily. 180 capsule 1   hydrOXYzine  (ATARAX ) 25 MG tablet Take 1 tablet (25 mg total) by mouth 3 (three) times daily as needed for anxiety. 75 tablet 1   lamoTRIgine  (LAMICTAL ) 100 MG tablet Take 1 tablet (100 mg total) by mouth 2 (two) times daily. 60 tablet 1   medroxyPROGESTERone  (DEPO-PROVERA ) 150 MG/ML injection Inject 150 mg into the muscle every 3 (three) months. Next dose due 07/03/22     Multiple Vitamins-Minerals (MULTIVITAMIN WITH MINERALS) tablet Take 1 tablet by mouth daily.     nicotine  polacrilex (NICORETTE ) 2 MG gum Take 1 each (2 mg total) by mouth as needed for smoking cessation. 100 tablet 0   promethazine  (PHENERGAN ) 25 MG tablet Take 1 tablet (25 mg total) by mouth every 6 (six) hours as needed for nausea or vomiting. 20 tablet 0   ramelteon  (ROZEREM ) 8 MG tablet Take 1 tablet (8 mg total) by mouth at bedtime. 30 tablet 1   No current facility-administered medications for this visit.     Musculoskeletal: Strength & Muscle Tone: within normal limits Gait & Station: normal Patient leans: N/A  Psychiatric Specialty Exam: Review of Systems  Psychiatric/Behavioral:  Positive for dysphoric mood and sleep disturbance. Negative for decreased concentration, hallucinations, self-injury and suicidal ideas. The patient is nervous/anxious. The patient is not hyperactive.     There were no vitals taken for this visit.There is no height or weight on file to calculate BMI.  General Appearance: Casual  Eye Contact:  Good   Speech:  Clear and Coherent and Normal Rate  Volume:  Normal  Mood:  Anxious and Depressed  Affect:  Congruent  Thought Process:  Coherent, Goal Directed, and Descriptions of Associations: Intact  Orientation:  Full (Time, Place, and Person)  Thought Content: WDL   Suicidal Thoughts:  No  Homicidal Thoughts:  No  Memory:  Immediate;   Good Recent;   Good Remote;   Good  Judgement:  Good  Insight:  Good  Psychomotor Activity:  Normal  Concentration:  Concentration: Good and Attention Span: Good  Recall:  Good  Fund of Knowledge: Good  Language: Good  Akathisia:  Yes  Handed:  Ambidextrous  AIMS (if indicated): not done  Assets:  Communication Skills Desire for Improvement Housing Social Support Vocational/Educational  ADL's:  Intact  Cognition: WNL  Sleep:  Fair   Screenings: AIMS    Flowsheet Row Video Visit from 08/07/2024 in [facility] Video Visit from 06/26/2024 in [facility] Video Visit from 05/01/2024 in [facility] Video Visit from 03/20/2024 in [facility] Admission (Discharged) from [facility]  AIMS Total Score 6 6 8 9  0   AUDIT    Flowsheet Row Admission (Discharged) from OP [facility] Admission (Discharged) from OP Visit from [facility] Admission (Discharged[facility] from OP [facility]  Alcohol Use Disorder Identification Test Final Score (AUDIT) 0 0 0   GAD-7    Flowsheet Row Video Visit from 08/07/2024 in [facility] Video Visit from 06/26/2024 in [facility] Video Visit from 05/01/2024 in [facility] Video Visit from 03/20/2024 in [facility] Video Visit from 01/17/2024 in [facility]  Total GAD-7 Score 15 21 16 14 11    PHQ2-9    Flowsheet Row Video Visit from 08/07/2024 in [facility] Video Visit from 06/26/2024 in [facility] Video Visit from 05/01/2024 in [facility] Video Visit from 03/20/2024 in [facility] Video Visit from 01/17/2024 in [facility]  PHQ-2 Total Score 5 2 2 6 2   PHQ-9 Total Score 20 8 10 20 8    Flowsheet Row Video Visit from 08/07/2024 in [facility] Video Visit from 06/26/2024 in [facility] Video Visit from 05/01/2024 in [facility]  C-SSRS RISK CATEGORY Moderate Risk Moderate Risk Moderate Risk     Assessment and Plan:   Karen [name] is a 33 year old, Caucasian female with a past psychiatric history significant for chronic anxiety, bipolar 1 disorder, and chronic PTSD who presents to [facility] via virtual video visit for follow-up and medication management.  An aims assessment was performed with the patient scoring a 6.  Patient endorses movements in her upper and lower extremities.  Unsure if these movements are attributed to tardive dyskinesia or restless leg syndrome.  Provider to manage patient's restlessness through the use of ropinirole 0.25 mg in the evening.  Patient was agreeable to recommendation.  Since the last encounter, patient reports that she has been struggling with worsening depression and anxiety.  Patient reports that she has experienced decreased appetite resulting in nausea.  Due to her nausea, patient reports that she has been unable to keep food or her medications down.  Patient notes that she has also discontinued her use of Caplyta citing that she experienced adverse  side effects while taking the medication.  PHQ-9 screen was performed with the patient scoring a 20.  A GAD-7 screen was also performed the patient scoring a 15.  Due to patient experiencing problems results from using Abilify , provider to place patient on Abilify  5 mg daily for 6 days, followed by 10 mg for 6 more days, then continue taking 20 mg daily for mood stability.  Patient was agreeable to recommendation.  For the management of her nausea, provider recommended patient take Zofran  4 mg as needed.  Patient to continue taking all other medications as prescribed.  Patient's medications to be e-prescribed to pharmacy of choice.  A [place] Suicide Severity Rating Scale was performed with the patient being considered moderate risk.  Patient denies suicidal ideations and is able to contract for safety at this time.  Safety planning was discussed with the patient prior to the conclusion of the encounter.  - Patient was instructed to contact 911 in the event of a mental health crisis. - Patient was instructed to contact [address] Su[facility] [employer] Crisis [employer] in the event of a mental health crisis. - Patient was instructed to present to [facility] in the event of a mental health crisis.  Collaboration of Care: Collaboration of Care: Medication Management AEB provider managing patient's psychiatric medications, Psychiatrist AEB patient being followed by mental health provider at this facility, and Referral or follow-up with counselor/therapist AEB patient being seen by a licensed clinical social  worker at this facility  Patient/Guardian was advised Release of Information must be obtained prior to any record release in order to collaborate their care with an outside provider. Patient/Guardian was advised if they have not already done so to contact the registration department to sign all necessary forms in order for us  to release information regarding their care.   Consent:  Patient/Guardian gives verbal consent for treatment and assignment of benefits for services provided during this visit. Patient/Guardian expressed understanding and agreed to proceed.   1. Restless leg syndrome (Primary)  - rOPINIRole (REQUIP) 0.25 MG tablet; Take 1 tablet (0.25 mg total) by mouth every evening.  Dispense: 30 tablet; Refill: 1  2. Extrapyramidal movement disorder, drug-induced  3. Chronic anxiety  - escitalopram  (LEXAPRO ) 10 MG tablet; Take 1 tablet (10 mg total) by mouth daily.  Dispense: 30 tablet; Refill: 1 - hydrOXYzine  (ATARAX ) 25 MG tablet; Take 1 tablet (25 mg total) by mouth 3 (three) times daily as needed for anxiety.  Dispense: 75 tablet; Refill: 1 - gabapentin  (NEURONTIN ) 300 MG capsule; Take 2 capsules (600 mg total) by mouth 3 (three) times daily.  Dispense: 180 capsule; Refill: 1  4. Bipolar 1 disorder (HCC)  - lamoTRIgine  (LAMICTAL ) 100 MG tablet; Take 1 tablet (100 mg total) by mouth 2 (two) times daily.  Dispense: 60 tablet; Refill: 1 - ARIPiprazole  (ABILIFY ) 5 MG tablet; Take 1 tablet (5 mg total) by mouth daily for 6 days, THEN 2 tablets (10 mg total) daily for 6 days.  Dispense: 18 tablet; Refill: 0 - ARIPiprazole  (ABILIFY ) 20 MG tablet; Take 1 tablet (20 mg total) by mouth daily.  Dispense: 30 tablet; Refill: 1  5. Insomnia due to other mental disorder  - ramelteon  (ROZEREM ) 8 MG tablet; Take 1 tablet (8 mg total) by mouth at bedtime.  Dispense: 30 tablet; Refill: 1  6. Nausea and vomiting, unspecified vomiting type  - ondansetron  (ZOFRAN ) 4 MG tablet; Take 1 tablet (4 mg total) by mouth as needed for nausea or vomiting.  Dispense: 30 tablet; Refill: 0  7. Long term current use of antipsychotic medication Labs pending EKG pending Patient to have labs drawn and EKG performed at primary care office  Patient to follow up in 7 weeks Provider spent a total of 21 minute with the patient/reviewing the patient's chart  [name], PA 08/07/2024,  4:00 PM

## 2024-08-20 ENCOUNTER — Ambulatory Visit (INDEPENDENT_AMBULATORY_CARE_PROVIDER_SITE_OTHER): Admitting: Licensed Clinical Social Worker

## 2024-08-20 DIAGNOSIS — F319 Bipolar disorder, unspecified: Secondary | ICD-10-CM | POA: Diagnosis not present

## 2024-08-20 DIAGNOSIS — F4312 Post-traumatic stress disorder, chronic: Secondary | ICD-10-CM

## 2024-08-20 DIAGNOSIS — F411 Generalized anxiety disorder: Secondary | ICD-10-CM | POA: Diagnosis not present

## 2024-08-20 NOTE — Progress Notes (Signed)
 THERAPIST PROGRESS NOTE  Virtual Visit via Video Note  I connected with Karen [name] on 08/20/24 at  9:00 AM EDT by a video enabled telemedicine application and verified that I am speaking with the correct person using two identifiers.  Location: Patient: [facility]  Provider: [facility]iders Home    I discussed the limitations of evaluation and management by telemedicine and the availability of in person appointments. The patient expressed understanding and agreed to proceed.    I discussed the assessment and treatment plan with the patient. The patient was provided an opportunity to ask questions and all were answered. The patient agreed with the plan and demonstrated an understanding of the instructions.   The patient was advised to call back or seek an in-person evaluation if the symptoms worsen or if the condition fails to improve as anticipated.  I provided 60 minutes of non-face-to-face time during this encounter.   Juliene GORMAN Patee, [name]   Participation Level: Active  Behavioral Response: CasualAlertAnxious and Depressed  Type of Therapy: Individual Therapy  Treatment Goals addressed:  Active     Bipolar Disorder CCP Problem  1 bipolar 1 disorder       walk 3 x weekly or workout  (Progressing)     Start:  03/07/22    Expected End:  09/04/24         maintain 40 hour a week employment  (Completed/Met)     Start:  03/07/22    Expected End:  10/04/22    Resolved:  08/08/22      LTG: Stabilize mood and increase goal-directed behavior: decrease PHQ-9 below 10  (Progressing)     Start:  03/07/22    Expected End:  09/04/24         STG: Karen [name] WILL COOPERATE WITH A PSYCHIATRIC EVALUATION ON 10/05/2020  AND DURING ALL FOLLOW-UP VISITS (Completed/Met)     Start:  03/07/22    Expected End:  10/04/22    Resolved:  03/26/22      STG: Karen [name] WILL IDENTIFY COGNITIVE PATTERNS AND BELIEFS THAT INTERFERE WITH THERAPY (Progressing)     Start:  03/07/22    Expected  End:  09/04/24          decrease GAD-7 below 5  (Progressing)     Start:  03/07/22    Expected End:  09/04/24            WORK WITH Karen [name] TO DISCUSS RISKS AND BENEFITS OF MEDICATION TREATMENT OPTIONS FOR THIS PROBLEM AND PRESCRIBE AS INDICATED (Completed)     Start:  03/07/22    End:  03/14/22      Conduct a review of all medications to evaluate any drug/drug interactions (Completed)     Start:  03/07/22    End:  03/14/22      REVIEW WITH Karen [name] THEIR RESPONSE TO THE PRESCRIBED MEDICATION, INCLUDING ANY SIDE EFFECTS (Completed)     Start:  03/07/22    End:  03/14/22      ENCOURAGE Karen [name] TO KEEP A LOG OF MEDICATION SIDE EFFECTS, QUESTIONS REGARDING MEDICATION, AND SYMPTOMS (Completed)     Start:  03/07/22    End:  03/26/22      INSTRUCT [name] TO TAKE PSYCHOTROPIC MEDICATION AS PRESCRIBED (Completed)     Start:  03/07/22    End:  03/14/22      INSTRUCT Karen [name] TO COMMUNICATE EFFECTS OF PRESCRIBED MEDICATIONS (Completed)     Start:  03/07/22    End:  03/26/22  EDUCATE Karen [name] ON BIPOLAR DISORDER DIAGNOSTIC CRITERIA (Completed)     Start:  03/07/22    End:  03/14/22      WORK WITH Karen [name] TO DEVELOP A LIST OF AT LEAST 1 CONSEQUENCES OF UNTREATED BIPOLAR SYMPTOMS (Completed)     Start:  03/07/22    End:  03/14/22         ProgressTowards Goals: Progressing  Interventions: CBT, Motivational Interviewing, and Supportive  Summary: Karen [name] is a 33 y.o. female who presents Karen [name] was alert and oriented x 5.  She was pleasant, cooperative, maintained good eye contact.  Patient engaged well in therapy session was dressed casually.  She presented with anxious mood\affect. Patient comes in today with primary stressors as ex relationship conflict.  Karen [name] has been struggling with her relationship with her son's father and his girlfriend.  Karen [name] states that she has been struggling with Karen [name]'s treatment and what is best for him.  She used the example of  a medication that the psychiatrist put him on that Karen [name]'s father was against along with his girlfriend.  Patient reports that he has advocated for Karen [name] not to take on medications.  Karen [name] states that it has been hard as he wants to please both his girlfriend and him.  Further frustrations are that she has offered for them to come to appointments for the medications to be discussed but they have not shown up.  Karen [name] does not fully understand her rights as they do not have a custody agreement in place, and it is her understanding that her and him have 50-50 custody.  [name] advised that [name] is not a Clinical research associate and that it would be recommended to discuss legal options with a lawyer to fully understand her rights.      Suicidal/Homicidal: Nowithout intent/plan  Therapist Response:    Intervention/Plan: [name] and patient spoke about today setting healthy boundaries and only engaging minimally with Karen [name]'s girlfriend with communication going through Karen [name] to help minimize stress. [name] encourage pt to focus on the positives in her life and she admits in session she tends to focus on the negatives.  [name] used psychoanalytic therapy for pt to express thoughts, feelings, and emotions in nonjudgmental environment.   Plan: [name] and patient discussed plan moving forward will be for her to transfer with [name] to [facility] office.  [name] let patient know as of December 15 he will be transferring to [facility] health psychiatric office.  Patient agreeable to plan  Plan: Return again in 4 weeks.  Diagnosis: Bipolar depression (HCC)  Chronic post-traumatic stress disorder (PTSD)  GAD (generalized anxiety disorder)  Collaboration of Care: Other continued individual therapy and medication mgnt at [facility]    Patient/Guardian was advised Release of Information must be obtained prior to any record release in order to collaborate their care with an outside provider. Patient/Guardian was advised if  they have not already done so to contact the registration department to sign all necessary forms in order for us  to release information regarding their care.   Consent: Patient/Guardian gives verbal consent for treatment and assignment of benefits for services provided during this visit. Patient/Guardian expressed understanding and agreed to proceed.   Juliene GORMAN Patee, [name] 08/20/2024

## 2024-09-07 ENCOUNTER — Ambulatory Visit (INDEPENDENT_AMBULATORY_CARE_PROVIDER_SITE_OTHER): Admitting: Licensed Clinical Social Worker

## 2024-09-07 DIAGNOSIS — F411 Generalized anxiety disorder: Secondary | ICD-10-CM

## 2024-09-07 DIAGNOSIS — F332 Major depressive disorder, recurrent severe without psychotic features: Secondary | ICD-10-CM

## 2024-09-07 DIAGNOSIS — F319 Bipolar disorder, unspecified: Secondary | ICD-10-CM

## 2024-09-07 NOTE — Progress Notes (Signed)
 THERAPIST PROGRESS NOTE Virtual Visit via Video Note  I connected with Karen [name] on 09/07/24 at  1:00 PM EST by a video enabled telemedicine application and verified that I am speaking with the correct person using two identifiers.  Location: Patient: Karen [name]  Provider: [facility]iders Home    I discussed the limitations of evaluation and management by telemedicine and the availability of in person appointments. The patient expressed understanding and agreed to proceed.   I discussed the assessment and treatment plan with the patient. The patient was provided an opportunity to ask questions and all were answered. The patient agreed with the plan and demonstrated an understanding of the instructions.   The patient was advised to call back or seek an in-person evaluation if the symptoms worsen or if the condition fails to improve as anticipated.  I provided 45 minutes of non-face-to-face time during this encounter.   Juliene GORMAN Patee, [name]   Participation Level: Active  Behavioral Response: CasualAlertAnxious and Depressed  Type of Therapy: Individual Therapy  Treatment Goals addressed:  Active     Bipolar Disorder CCP Problem  1 bipolar 1 disorder       walk 3 x weekly or workout  (Progressing)     Start:  03/07/22    Expected End:  02/05/25         maintain 40 hour a week employment  (Completed/Met)     Start:  03/07/22    Expected End:  10/04/22    Resolved:  08/08/22      LTG: Stabilize mood and increase goal-directed behavior: decrease PHQ-9 below 10  (Progressing)     Start:  03/07/22    Expected End:  02/05/25         STG: Karen [name] WILL COOPERATE WITH A PSYCHIATRIC EVALUATION ON 10/05/2020  AND DURING ALL FOLLOW-UP VISITS (Completed/Met)     Start:  03/07/22    Expected End:  10/04/22    Resolved:  03/26/22      STG: Karen [name] WILL IDENTIFY COGNITIVE PATTERNS AND BELIEFS THAT INTERFERE WITH THERAPY (Progressing)     Start:  03/07/22    Expected  End:  02/05/25          decrease GAD-7 below 5  (Progressing)     Start:  03/07/22    Expected End:  02/05/25            WORK WITH Karen [name] TO DISCUSS RISKS AND BENEFITS OF MEDICATION TREATMENT OPTIONS FOR THIS PROBLEM AND PRESCRIBE AS INDICATED (Completed)     Start:  03/07/22    End:  03/14/22      Conduct a review of all medications to evaluate any drug/drug interactions (Completed)     Start:  03/07/22    End:  03/14/22      REVIEW WITH Karen [name] THEIR RESPONSE TO THE PRESCRIBED MEDICATION, INCLUDING ANY SIDE EFFECTS (Completed)     Start:  03/07/22    End:  03/14/22      ENCOURAGE Karen [name] TO KEEP A LOG OF MEDICATION SIDE EFFECTS, QUESTIONS REGARDING MEDICATION, AND SYMPTOMS (Completed)     Start:  03/07/22    End:  03/26/22      INSTRUCT Karen [name] TO TAKE PSYCHOTROPIC MEDICATION AS PRESCRIBED (Completed)     Start:  03/07/22    End:  03/14/22      INSTRUCT Karen [name] TO COMMUNICATE EFFECTS OF PRESCRIBED MEDICATIONS (Completed)     Start:  03/07/22    End:  03/26/22      EDUCATE  Karen [name] ON BIPOLAR DISORDER DIAGNOSTIC CRITERIA (Completed)     Start:  03/07/22    End:  03/14/22      WORK WITH Karen [name] TO DEVELOP A LIST OF AT LEAST 1 CONSEQUENCES OF UNTREATED BIPOLAR SYMPTOMS (Completed)     Start:  03/07/22    End:  03/14/22         ProgressTowards Goals: Progressing  Interventions: CBT, Motivational Interviewing, and Supportive  Summary: Karen [name] is a 33 y.o. female who presents with   Suicidal/Homicidal: Nowithout intent/plan  Therapist Response:     S: Subjective Karen [name] reports ongoing stress related to her relationship with her fianc. She identifies difficulty setting healthy boundaries, particularly around caregiving responsibilities for the children. Blonnie feels that her fianc expects her to always say yes because she is currently unemployed. She acknowledges that when she becomes frustrated or angry, she often says hurtful things she  does not mean and would like to learn how to communicate more effectively without becoming irritable.  O: Objective Karen [name] was alert and oriented 5. She appeared pleasant, cooperative, and casually dressed, maintaining good eye contact throughout the session. She engaged well in therapy and was receptive to feedback and discussion.  A: Assessment Karen [name] is experiencing interpersonal stress related to boundary-setting and communication within her relationship. Her difficulty maintaining emotional regulation during conflict contributes to relational tension. She demonstrates insight into her behavior and motivation to improve communication patterns.  P: Plan  Continue to explore healthy boundary-setting and assertive communication strategies.  Review and practice "fair fighting rules" worksheet in upcoming sessions.  Utilize motivational interviewing and reflective listening to enhance communication skills.  Encourage Karen [name] to apply assertive communication techniques with her fianc and reflect on outcomes in next session.  Provide ongoing validation and support as Karen [name] works to regulate emotional responses and strengthen relationship skills.  Plan: Return again in 3 weeks.  Diagnosis: Bipolar 1 disorder (HCC)  Severe episode of recurrent major depressive disorder, without psychotic features (HCC)  GAD (generalized anxiety disorder)  Collaboration of Care: Other [employer]   Patient/Guardian was advised Release of Information must be obtained prior to any record release in order to collaborate their care with an outside provider. Patient/Guardian was advised if they have not already done so to contact the registration department to sign all necessary forms in order for us  to release information regarding their care.   Consent: Patient/Guardian gives verbal consent for treatment and assignment of benefits for services provided during this visit. Patient/Guardian expressed  understanding and agreed to proceed.   Juliene GORMAN Patee, [name] 09/07/2024

## 2024-09-25 ENCOUNTER — Telehealth (HOSPITAL_COMMUNITY): Admitting: Physician Assistant

## 2024-09-25 DIAGNOSIS — F319 Bipolar disorder, unspecified: Secondary | ICD-10-CM

## 2024-09-25 DIAGNOSIS — G2581 Restless legs syndrome: Secondary | ICD-10-CM

## 2024-09-25 DIAGNOSIS — F419 Anxiety disorder, unspecified: Secondary | ICD-10-CM | POA: Diagnosis not present

## 2024-09-25 MED ORDER — HYDROXYZINE HCL 25 MG PO TABS: 25.0000 mg | ORAL_TABLET | Freq: Three times a day (TID) | ORAL | 1 refills | Status: AC | PRN

## 2024-09-25 MED ORDER — ROPINIROLE HCL 0.25 MG PO TABS: 0.2500 mg | ORAL_TABLET | Freq: Every evening | ORAL | 1 refills | Status: DC

## 2024-09-25 MED ORDER — ARIPIPRAZOLE 20 MG PO TABS: 20.0000 mg | ORAL_TABLET | Freq: Every day | ORAL | 1 refills | Status: DC

## 2024-09-25 MED ORDER — GABAPENTIN 300 MG PO CAPS: 600.0000 mg | ORAL_CAPSULE | Freq: Three times a day (TID) | ORAL | 1 refills | Status: AC

## 2024-09-25 MED ORDER — LAMOTRIGINE 100 MG PO TABS: 100.0000 mg | ORAL_TABLET | Freq: Two times a day (BID) | ORAL | 1 refills | Status: DC
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
## 2024-09-28 ENCOUNTER — Ambulatory Visit (HOSPITAL_COMMUNITY): Admitting: Licensed Clinical Social Worker

## 2024-09-28 ENCOUNTER — Encounter (HOSPITAL_COMMUNITY): Payer: Self-pay

## 2024-09-28 ENCOUNTER — Encounter (HOSPITAL_COMMUNITY): Payer: Self-pay | Admitting: Physician Assistant

## 2024-10-12 ENCOUNTER — Ambulatory Visit (INDEPENDENT_AMBULATORY_CARE_PROVIDER_SITE_OTHER): Admitting: Licensed Clinical Social Worker

## 2024-10-12 DIAGNOSIS — F319 Bipolar disorder, unspecified: Secondary | ICD-10-CM

## 2024-10-12 NOTE — Progress Notes (Signed)
 THERAPIST PROGRESS NOTE  Virtual Visit via Video Note  I connected with Karen [name] on 10/12/24 at  2:00 PM EST by a video enabled telemedicine application and verified that I am speaking with the correct person using two identifiers.  Location: Patient: [facility]  Provider: Providers Home    I discussed the limitations of evaluation and management by telemedicine and the availability of in person appointments. The patient expressed understanding and agreed to proceed.     I discussed the assessment and treatment plan with the patient. The patient was provided an opportunity to ask questions and all were answered. The patient agreed with the plan and demonstrated an understanding of the instructions.   The patient was advised to call back or seek an in-person evaluation if the symptoms worsen or if the condition fails to improve as anticipated.  I provided 45 minutes of non-face-to-face time during this encounter.   Juliene GORMAN Patee, [name]   Participation Level: Active  Behavioral Response: CasualAlertAnxious and Depressed  Type of Therapy: Individual Therapy  Treatment Goals addressed:  Active     Bipolar Disorder CCP Problem  1 bipolar 1 disorder       walk 3 x weekly or workout  (Progressing)     Start:  03/07/22    Expected End:  02/05/25         maintain 40 hour a week employment  (Completed/Met)     Start:  03/07/22    Expected End:  10/04/22    Resolved:  08/08/22      LTG: Stabilize mood and increase goal-directed behavior: decrease PHQ-9 below 10  (Progressing)     Start:  03/07/22    Expected End:  02/05/25         STG: Karen [name] WILL COOPERATE WITH A PSYCHIATRIC EVALUATION ON 10/05/2020  AND DURING ALL FOLLOW-UP VISITS (Completed/Met)     Start:  03/07/22    Expected End:  10/04/22    Resolved:  03/26/22      STG: Karen [name] WILL IDENTIFY COGNITIVE PATTERNS AND BELIEFS THAT INTERFERE WITH THERAPY (Progressing)     Start:  03/07/22     Expected End:  02/05/25          decrease GAD-7 below 5  (Progressing)     Start:  03/07/22    Expected End:  02/05/25            WORK WITH Karen [name] TO DISCUSS RISKS AND BENEFITS OF MEDICATION TREATMENT OPTIONS FOR THIS PROBLEM AND PRESCRIBE AS INDICATED (Completed)     Start:  03/07/22    End:  03/14/22      Conduct a review of all medications to evaluate any drug/drug interactions (Completed)     Start:  03/07/22    End:  03/14/22      REVIEW WITH Karen [name] THEIR RESPONSE TO THE PRESCRIBED MEDICATION, INCLUDING ANY SIDE EFFECTS (Completed)     Start:  03/07/22    End:  03/14/22      ENCOURAGE Karen [name] TO KEEP A LOG OF MEDICATION SIDE EFFECTS, QUESTIONS REGARDING MEDICATION, AND SYMPTOMS (Completed)     Start:  03/07/22    End:  03/26/22      INSTRUCT Karen [name] TO TAKE PSYCHOTROPIC MEDICATION AS PRESCRIBED (Completed)     Start:  03/07/22    End:  03/14/22      INSTRUCT Karen [name] TO COMMUNICATE EFFECTS OF PRESCRIBED MEDICATIONS (Completed)     Start:  03/07/22    End:  03/26/22  EDUCATE Karen [name] ON BIPOLAR DISORDER DIAGNOSTIC CRITERIA (Completed)     Start:  03/07/22    End:  03/14/22      WORK WITH Karen [name] TO DEVELOP A LIST OF AT LEAST 1 CONSEQUENCES OF UNTREATED BIPOLAR SYMPTOMS (Completed)     Start:  03/07/22    End:  03/14/22         ProgressTowards Goals: Progressing  Interventions: CBT, Motivational Interviewing, and Supportive    Suicidal/Homicidal: Nowithout intent/plan  Therapist Response:   S: Subjective Karen [name] reports multiple stressors related to school, wedding planning, and family conflict. She states she is feeling very stressed about her upcoming wedding, which is in two weeks. She reports ongoing stress related to her fianc's side of the family and shared an example of attending her fianc's cousin's wedding where guests were smoking while walking down the aisle (outdoors), and the bride's father walked down the aisle with his shirt open.  Karen [name] reports she does not want to be a "bridezilla," but she does have certain expectations for her own wedding.  She states she advocated for herself by hiring a same-day wedding coordinator to assist with organizing the wedding day. She reports wanting to delegate tasks so she can enjoy her day with her future husband and their son.  Additional stressors include exam week, which is one week away, and needing to complete her finals before her wedding. [name] states she is trying to prioritize herself but often feels compelled to fix other people's problems. She reports struggling with emotional regulation and endorses symptoms of hopelessness, irritability, fatigue, and changes in energy. She denies suicidal or homicidal ideation.  O: Objective  Patient is alert, oriented, and engaged throughout the session.  Mood appears stressed but motivated; affect congruent.  Patient demonstrates good insight into stressors and emotional patterns.  No signs of acute safety concerns or psychosis observed.  A: Assessment Karen [name] is experiencing heightened stress related to academic demands, upcoming wedding planning, and interpersonal family conflict. Symptoms align with difficulty in emotional regulation and increased anxiety during a high-pressure period. She appears to be in the action stage of change, demonstrating willingness to apply coping strategies and delegate tasks. No current SI/HI reported.  P: Plan / Intervention  [name] utilized psychodynamic therapy to allow patient space to express thoughts, feelings, and emotions in a nonjudgmental environment.  Provided supportive therapy with praise and encouragement.  Validated patient's emotional experience using empathetic communication.  Educated patient on clarifying personal values and distinguishing between what she can and cannot control.  Reviewed the DBT distress-tolerance acronym TIPP (Temperature, Intense exercise, Paced breathing,  Progressive muscle relaxation).  Practiced examples of each TIPP skill in session.  Sent patient a worksheet containing TIPP strategies and examples.  Encouraged consistent medication adherence as prescribed.  Next appointment scheduled for December 24 at 9:00 AM (virtual).  Patient remains appropriate for outpatient therapy.    Plan: Return again in 2 weeks.  Diagnosis: Bipolar 1 disorder (HCC)    Patient/Guardian was advised Release of Information must be obtained prior to any record release in order to collaborate their care with an outside provider. Patient/Guardian was advised if they have not already done so to contact the registration department to sign all necessary forms in order for us  to release information regarding their care.   Consent: Patient/Guardian gives verbal consent for treatment and assignment of benefits for services provided during this visit. Patient/Guardian expressed understanding and agreed to proceed.   Juliene GORMAN Patee, [name] 10/12/2024

## 2024-10-28 ENCOUNTER — Ambulatory Visit (HOSPITAL_COMMUNITY): Admitting: Licensed Clinical Social Worker

## 2024-10-28 DIAGNOSIS — F411 Generalized anxiety disorder: Secondary | ICD-10-CM | POA: Diagnosis not present

## 2024-10-28 DIAGNOSIS — F319 Bipolar disorder, unspecified: Secondary | ICD-10-CM

## 2024-10-28 NOTE — Progress Notes (Signed)
 "  THERAPIST PROGRESS NOTE  Virtual Visit via Video Note  I connected with Karen [name] on 10/28/2024 at  9:00 AM EST by a video enabled telemedicine application and verified that I am speaking with the correct person using two identifiers.  Location: Patient: Karen [name]  Provider: Providers Home    I discussed the limitations of evaluation and management by telemedicine and the availability of in person appointments. The patient expressed understanding and agreed to proceed.   I discussed the assessment and treatment plan with the patient. The patient was provided an opportunity to ask questions and all were answered. The patient agreed with the plan and demonstrated an understanding of the instructions.   The patient was advised to call back or seek an in-person evaluation if the symptoms worsen or if the condition fails to improve as anticipated.  I provided 45 minutes of non-face-to-face time during this encounter.   Juliene GORMAN Patee, [name]   Participation Level: Active  Behavioral Response: CasualAlertAnxious and Depressed  Type of Therapy: Individual Therapy  Treatment Goals addressed:  Active     Bipolar Disorder CCP Problem  1 bipolar 1 disorder       walk 3 x weekly or workout  (Progressing)     Start:  03/07/22    Expected End:  02/05/25         maintain 40 hour a week employment  (Completed/Met)     Start:  03/07/22    Expected End:  10/04/22    Resolved:  08/08/22      LTG: Stabilize mood and increase goal-directed behavior: decrease PHQ-9 below 10  (Progressing)     Start:  03/07/22    Expected End:  02/05/25         STG: Karen [name] WILL COOPERATE WITH A PSYCHIATRIC EVALUATION ON 10/05/2020  AND DURING ALL FOLLOW-UP VISITS (Completed/Met)     Start:  03/07/22    Expected End:  10/04/22    Resolved:  03/26/22      STG: Karen [name] WILL IDENTIFY COGNITIVE PATTERNS AND BELIEFS THAT INTERFERE WITH THERAPY (Progressing)     Start:  03/07/22     Expected End:  02/05/25          decrease GAD-7 below 5  (Progressing)     Start:  03/07/22    Expected End:  02/05/25            WORK WITH Karen [name] TO DISCUSS RISKS AND BENEFITS OF MEDICATION TREATMENT OPTIONS FOR THIS PROBLEM AND PRESCRIBE AS INDICATED (Completed)     Start:  03/07/22    End:  03/14/22      Conduct a review of all medications to evaluate any drug/drug interactions (Completed)     Start:  03/07/22    End:  03/14/22      REVIEW WITH Karen [name] THEIR RESPONSE TO THE PRESCRIBED MEDICATION, INCLUDING ANY SIDE EFFECTS (Completed)     Start:  03/07/22    End:  03/14/22      ENCOURAGE Karen [name] TO KEEP A LOG OF MEDICATION SIDE EFFECTS, QUESTIONS REGARDING MEDICATION, AND SYMPTOMS (Completed)     Start:  03/07/22    End:  03/26/22      INSTRUCT Karen [name] TO TAKE PSYCHOTROPIC MEDICATION AS PRESCRIBED (Completed)     Start:  03/07/22    End:  03/14/22      INSTRUCT Karen [name] TO COMMUNICATE EFFECTS OF PRESCRIBED MEDICATIONS (Completed)     Start:  03/07/22    End:  03/26/22  EDUCATE Karen [name] ON BIPOLAR DISORDER DIAGNOSTIC CRITERIA (Completed)     Start:  03/07/22    End:  03/14/22      WORK WITH Karen [name] TO DEVELOP A LIST OF AT LEAST 1 CONSEQUENCES OF UNTREATED BIPOLAR SYMPTOMS (Completed)     Start:  03/07/22    End:  03/14/22         ProgressTowards Goals: Progressing  Interventions: CBT, Motivational Interviewing, and Supportive   Suicidal/Homicidal: Nowithout intent/plan  Therapist Response:     S - Subjective: Karen [name] reports increased emotional distress related to recent interpersonal conflict with her spouse. She identifies stressors including adjustment to a new marriage, hosting an ill family member, academic demands related to final exams, and the holiday season. She reports feelings of frustration and difficulty managing emotional responses during a recent disagreement involving family boundaries. Karen [name] reports symptoms of fatigue,  irritability, anger, tension, excessive worry, and restlessness. She reports insight into the situation and states she could have communicated her thoughts and emotions more effectively. She denies that her emotional responses are solely related to mental health symptoms and reports ongoing medication adjustments under psychiatric supervision. No suicidal or homicidal ideation reported.  O - Objective: Karen [name] was alert and oriented 5. She was pleasant, cooperative, and maintained appropriate eye contact throughout the session. Appearance was casual with appropriate hygiene. She was engaged and participatory in the therapeutic session. Mood appeared irritable and anxious with flat affect. Thought process was logical and goal-directed. No psychotic symptoms observed.  A - Assessment: Karen [name] presents with anxiety- and mood-related symptoms that are impacting emotional regulation and interpersonal functioning. Symptoms appear exacerbated by recent life transitions, cumulative stressors, and reduced coping capacity. Patient demonstrates insight, motivation for treatment, and ability to reflect on behavior. Current risk level assessed as low.  P - Plan / Interventions: [name] provided individual psychotherapy utilizing psychodynamic interventions to facilitate expression of thoughts and emotions within a nonjudgmental therapeutic environment. Supportive therapy was utilized to reinforce strengths and encourage adaptive coping. Psychoeducation was provided regarding effective communication strategies, including awareness of tone of voice and nonverbal communication. Emotional regulation strategies were reviewed and practiced to support symptom management. Treatment will continue to focus on improving coping skills, communication, and emotional regulation to reduce symptom severity and improve daily functioning.  Plan: Return again in 3  weeks.  Diagnosis: Bipolar 1 disorder (HCC)  GAD (generalized anxiety  disorder)  Collaboration of Care: Other Continued medication mgmt and therapy services through [facility].   Patient/Guardian was advised Release of Information must be obtained prior to any record release in order to collaborate their care with an outside provider. Patient/Guardian was advised if they have not already done so to contact the registration department to sign all necessary forms in order for us  to release information regarding their care.   Consent: Patient/Guardian gives verbal consent for treatment and assignment of benefits for services provided during this visit. Patient/Guardian expressed understanding and agreed to proceed.   Juliene GORMAN Patee, [name] 10/28/2024  "

## 2024-11-06 ENCOUNTER — Telehealth (HOSPITAL_COMMUNITY): Admitting: Physician Assistant

## 2024-11-06 DIAGNOSIS — F319 Bipolar disorder, unspecified: Secondary | ICD-10-CM | POA: Diagnosis not present

## 2024-11-06 DIAGNOSIS — G2581 Restless legs syndrome: Secondary | ICD-10-CM

## 2024-11-06 DIAGNOSIS — F419 Anxiety disorder, unspecified: Secondary | ICD-10-CM

## 2024-11-10 ENCOUNTER — Ambulatory Visit (INDEPENDENT_AMBULATORY_CARE_PROVIDER_SITE_OTHER): Admitting: Licensed Clinical Social Worker

## 2024-11-10 DIAGNOSIS — F319 Bipolar disorder, unspecified: Secondary | ICD-10-CM | POA: Diagnosis not present

## 2024-11-10 DIAGNOSIS — F411 Generalized anxiety disorder: Secondary | ICD-10-CM | POA: Diagnosis not present

## 2024-11-10 NOTE — Progress Notes (Signed)
 "  THERAPIST PROGRESS NOTE  Virtual Visit via Video Note  I connected with Karen [name] on 11/10/2024 at  2:00 PM EST by a video enabled telemedicine application and verified that I am speaking with the correct person using two identifiers.  Location: Patient: Pt home  Provider: Providers home office    I discussed the limitations of evaluation and management by telemedicine and the availability of in person appointments. The patient expressed understanding and agreed to proceed.    I discussed the assessment and treatment plan with the patient. The patient was provided an opportunity to ask questions and all were answered. The patient agreed with the plan and demonstrated an understanding of the instructions.   The patient was advised to call back or seek an in-person evaluation if the symptoms worsen or if the condition fails to improve as anticipated.  I provided 55 minutes of non-face-to-face time during this encounter.   Juliene GORMAN Patee, Karen [name]   Participation Level: Active  Behavioral Response: CasualAlertAnxious and Depressed  Type of Therapy: Individual Therapy  Treatment Goals addressed:  Active     Bipolar Disorder CCP Problem  1 bipolar 1 disorder       walk 3 x weekly or workout  (Progressing)     Start:  03/07/22    Expected End:  02/05/25         maintain 40 hour a week employment  (Completed/Met)     Start:  03/07/22    Expected End:  10/04/22    Resolved:  08/08/22      LTG: Stabilize mood and increase goal-directed behavior: decrease PHQ-9 below 10  (Progressing)     Start:  03/07/22    Expected End:  02/05/25         STG: Karen [name] WILL COOPERATE WITH A PSYCHIATRIC EVALUATION ON 10/05/2020  AND DURING ALL FOLLOW-UP VISITS (Completed/Met)     Start:  03/07/22    Expected End:  10/04/22    Resolved:  03/26/22      STG: Karen [name] WILL IDENTIFY COGNITIVE PATTERNS AND BELIEFS THAT INTERFERE WITH THERAPY (Progressing)     Start:  03/07/22    Expected  End:  02/05/25          decrease GAD-7 below 5  (Progressing)     Start:  03/07/22    Expected End:  02/05/25            WORK WITH Karen [name] TO DISCUSS RISKS AND BENEFITS OF MEDICATION TREATMENT OPTIONS FOR THIS PROBLEM AND PRESCRIBE AS INDICATED (Completed)     Start:  03/07/22    End:  03/14/22      Conduct a review of all medications to evaluate any drug/drug interactions (Completed)     Start:  03/07/22    End:  03/14/22      REVIEW WITH Karen [name] THEIR RESPONSE TO THE PRESCRIBED MEDICATION, INCLUDING ANY SIDE EFFECTS (Completed)     Start:  03/07/22    End:  03/14/22      ENCOURAGE Karen [name] TO KEEP A LOG OF MEDICATION SIDE EFFECTS, QUESTIONS REGARDING MEDICATION, AND SYMPTOMS (Completed)     Start:  03/07/22    End:  03/26/22      INSTRUCT Karen [name] TO TAKE PSYCHOTROPIC MEDICATION AS PRESCRIBED (Completed)     Start:  03/07/22    End:  03/14/22      INSTRUCT Karen [name] TO COMMUNICATE EFFECTS OF PRESCRIBED MEDICATIONS (Completed)     Start:  03/07/22    End:  03/26/22  EDUCATE Karen [name] ON BIPOLAR DISORDER DIAGNOSTIC CRITERIA (Completed)     Start:  03/07/22    End:  03/14/22      WORK WITH Karen [name] TO DEVELOP A LIST OF AT LEAST 1 CONSEQUENCES OF UNTREATED BIPOLAR SYMPTOMS (Completed)     Start:  03/07/22    End:  03/14/22         ProgressTowards Goals: Progressing  Interventions: CBT, Motivational Interviewing, and Supportive   Suicidal/Homicidal: Nowithout intent/plan  Therapist Response:    S (Subjective):  Karen [name] endorsed symptoms of tension, excessive worry, fatigue, restlessness, and changes in energy level. She related these symptoms to recent stressors, including discovering that guinea pigs she was fostering were pregnant without her knowledge. She reported waking up to find four to five additional guinea pigs that now require care. Karen [name] stated that this has created increased stress, as her husband is unhappy with the situation and they had  previously agreed not to take on additional animals.  Karen [name] also reported a positive development, noting that her dog has completed an initial course of psychiatric service dog training and will continue training until approximately [age] years of age, at which time she plans to take the certification test.  Additional stressors include her mothers ongoing illness. Karen [name] reported that her mother contacts her multiple times per week to complain, which she finds overwhelming. She stated that she feels guilt and remorse when she does not engage with her mother, as she feels she is her mothers primary source of emotional support.  O (Objective):  Patient was alert and oriented 4. Karen [name] was pleasant, cooperative, and maintained good eye contact. She was casually dressed and engaged appropriately throughout the therapy session. Mood appeared anxious with congruent affect.  A (Assessment):  Karen [name] presents with anxiety symptoms related to situational stressors, including unexpected caregiving responsibilities, marital tension, and emotional burden associated with her mothers illness. Difficulty setting boundaries and guilt appear to contribute to emotional distress.  P (Plan / Intervention):  Karen [name] provided interventions focused on developing and maintaining healthy boundaries, particularly in relation to family dynamics. Karen [name] encouraged continuation of psychiatric service dog training as a supportive coping resource. Karen [name] utilized supportive therapy to provide praise and reinforce Karen [name] efforts to manage multiple stressors. Karen [name] utilized psychodynamic therapy to facilitate expression of thoughts, feelings, and emotions in a safe, nonjudgmental environment. Karen [name] validated Karen [name] emotional experiences through empathic responses.  Plan: Return again in 1 weeks.  Diagnosis: Bipolar 1 disorder (HCC)  GAD (generalized anxiety disorder)  Collaboration of Care: Other     Patient/Guardian was advised Release of Information must be obtained prior to any record release in order to collaborate their care with an outside provider. Patient/Guardian was advised if they have not already done so to contact the registration department to sign all necessary forms in order for us  to release information regarding their care.   Consent: Patient/Guardian gives verbal consent for treatment and assignment of benefits for services provided during this visit. Patient/Guardian expressed understanding and agreed to proceed.   Juliene GORMAN Patee, Karen [name] 11/10/2024  "

## 2024-11-18 ENCOUNTER — Ambulatory Visit (HOSPITAL_COMMUNITY): Admitting: Licensed Clinical Social Worker

## 2024-11-18 DIAGNOSIS — F319 Bipolar disorder, unspecified: Secondary | ICD-10-CM | POA: Diagnosis not present

## 2024-11-18 DIAGNOSIS — F411 Generalized anxiety disorder: Secondary | ICD-10-CM

## 2024-11-18 NOTE — Progress Notes (Signed)
 "  THERAPIST PROGRESS NOTE  Virtual Visit via Video Note  I connected with Lileigh Fahringer on 11/18/2024 at 10:00 AM EST by a video enabled telemedicine application and verified that I am speaking with the correct person using two identifiers.  Location: Patient: Pt home  Provider: Providers Home office    I discussed the limitations of evaluation and management by telemedicine and the availability of in person appointments. The patient expressed understanding and agreed to proceed.   I discussed the assessment and treatment plan with the patient. The patient was provided an opportunity to ask questions and all were answered. The patient agreed with the plan and demonstrated an understanding of the instructions.   The patient was advised to call back or seek an in-person evaluation if the symptoms worsen or if the condition fails to improve as anticipated.  I provided 45 minutes of non-face-to-face time during this encounter.   Juliene GORMAN Patee, [name]   Participation Level: Active  Behavioral Response: CasualAlertAnxious  Type of Therapy: Individual Therapy  Treatment Goals addressed:  Active     Bipolar Disorder CCP Problem  1 bipolar 1 disorder       walk 3 x weekly or workout  (Progressing)     Start:  03/07/22    Expected End:  02/05/25         maintain 40 hour a week employment  (Completed/Met)     Start:  03/07/22    Expected End:  10/04/22    Resolved:  08/08/22      LTG: Stabilize mood and increase goal-directed behavior: decrease PHQ-9 below 10  (Progressing)     Start:  03/07/22    Expected End:  02/05/25         STG: [name] WILL COOPERATE WITH A PSYCHIATRIC EVALUATION ON 10/05/2020  AND DURING ALL FOLLOW-UP VISITS (Completed/Met)     Start:  03/07/22    Expected End:  10/04/22    Resolved:  03/26/22      STG: [name] WILL IDENTIFY COGNITIVE PATTERNS AND BELIEFS THAT INTERFERE WITH THERAPY (Progressing)     Start:  03/07/22    Expected End:  02/05/25           decrease GAD-7 below 5  (Progressing)     Start:  03/07/22    Expected End:  02/05/25            WORK WITH [name] TO DISCUSS RISKS AND BENEFITS OF MEDICATION TREATMENT OPTIONS FOR THIS PROBLEM AND PRESCRIBE AS INDICATED (Completed)     Start:  03/07/22    End:  03/14/22      Conduct a review of all medications to evaluate any drug/drug interactions (Completed)     Start:  03/07/22    End:  03/14/22      REVIEW WITH [name] THEIR RESPONSE TO THE PRESCRIBED MEDICATION, INCLUDING ANY SIDE EFFECTS (Completed)     Start:  03/07/22    End:  03/14/22      ENCOURAGE [place] TO KEEP A LOG OF MEDICATION SIDE EFFECTS, QUESTIONS REGARDING MEDICATION, AND SYMPTOMS (Completed)     Start:  03/07/22    End:  03/26/22      INSTRUCT Kailin TO TAKE PSYCHOTROPIC MEDICATION AS PRESCRIBED (Completed)     Start:  03/07/22    End:  03/14/22      INSTRUCT Murrell TO COMMUNICATE EFFECTS OF PRESCRIBED MEDICATIONS (Completed)     Start:  03/07/22    End:  03/26/22      EDUCATE  Delailah ON BIPOLAR DISORDER DIAGNOSTIC CRITERIA (Completed)     Start:  03/07/22    End:  03/14/22      WORK WITH [name] TO DEVELOP A LIST OF AT LEAST 1 CONSEQUENCES OF UNTREATED BIPOLAR SYMPTOMS (Completed)     Start:  03/07/22    End:  03/14/22         ProgressTowards Goals: Progressing  Interventions: CBT, Motivational Interviewing, and Reframing  Suicidal/Homicidal: Nowithout intent/plan  Therapist Response:   S (Subjective):  Daleah presented alert and oriented 5. She was pleasant, cooperative, and maintained good eye contact throughout the session. She was casually dressed and engaged well in therapy. She presented with a depressed and anxious mood and affect.  Kenza reported increased depressive symptoms, including low motivation, feelings of worthlessness, and hopelessness. She described ongoing communication difficulties with her husband, [name], stating that when he just wants to  talk, she often becomes annoyed. She reflected that this reaction may be related to childhood trauma, reporting experiences in which her father would shut her out, leading her to associate conversations with emotional overwhelm.  Jaclyne reported a strong need for personal space, stating that when she is not addressing others needs, she desires time alone without interaction. She described feeling overwhelmed by the number of responsibilities she manages, including coordinating her childs schedule, her husbands schedule, her husbands childs schedule, and her own. She acknowledged awareness that these challenges are part of the ongoing adjustment as she and her husband continue to merge their families following their wedding last month, noting that they have been living together for over one year.  O (Objective): Eletha was attentive and engaged during the session. Affect was congruent with reported mood. Thought processes were logical and goal-directed. No psychomotor agitation or perceptual disturbances were observed.  A (Assessment): Eleaner presents with symptoms of depression and anxiety related to role overload, adjustment to blended family dynamics, and unresolved childhood trauma impacting communication patterns. Insight is present, as evidenced by her ability to identify contributing factors and willingness to work toward change. Protective factors include engagement in therapy, marital commitment, and goal-directed planning.  P (Plan): [name] utilized psychodynamic therapy to provide a nonjudgmental environment for exploration of thoughts, feelings, and emotional patterns. Supportive therapy was utilized to provide encouragement and reinforce strengths. Psychoeducation was provided on organizational strategies, including the use of a planner to help manage schedules, reduce anxiety, and prevent overwhelm. Meagon acknowledged that although a shared calendar exists, she has not been  consistently using it.  Nadiyah identified goals for the next session, including making a conscious effort to utilize a planner and prioritizing intentional communication with her partner. [name] provided education on effective communication strategies, including reducing distractions by turning off technology (cell phones, television, and laptops) during conversations. [name] also encouraged the use of a timer to set boundaries around communication time to prevent overwhelm and to allow Guiselle to mentally prepare prior to discussions with her partner.    Plan: Return again in 2 weeks.  Diagnosis: Bipolar 1 disorder (HCC)  GAD (generalized anxiety disorder)  Collaboration of Care: Other None today   Patient/Guardian was advised Release of Information must be obtained prior to any record release in order to collaborate their care with an outside provider. Patient/Guardian was advised if they have not already done so to contact the registration department to sign all necessary forms in order for us  to release information regarding their care.   Consent: Patient/Guardian gives verbal consent for treatment and assignment of benefits  for services provided during this visit. Patient/Guardian expressed understanding and agreed to proceed.   Juliene GORMAN Patee, [name] 11/18/2024  "

## 2024-12-01 ENCOUNTER — Encounter (HOSPITAL_COMMUNITY): Payer: Self-pay | Admitting: Physician Assistant

## 2024-12-01 MED ORDER — ROPINIROLE HCL 0.25 MG PO TABS: 0.2500 mg | ORAL_TABLET | Freq: Every evening | ORAL | 1 refills | Status: AC

## 2024-12-01 MED ORDER — LAMOTRIGINE 100 MG PO TABS: 100.0000 mg | ORAL_TABLET | Freq: Two times a day (BID) | ORAL | 1 refills | Status: AC

## 2024-12-01 MED ORDER — ARIPIPRAZOLE 20 MG PO TABS: 20.0000 mg | ORAL_TABLET | Freq: Every day | ORAL | 1 refills | Status: AC

## 2024-12-01 NOTE — Progress Notes (Cosign Needed)
 BH MD/PA/NP OP Progress Note  Virtual Visit via Video Note  I connected with Karen [name] on 11/06/24 at  5:00 PM EST by a video enabled telemedicine application and verified that I am speaking with the correct person using two identifiers.  Location: Patient: Home Provider: Clinic   I discussed the limitations of evaluation and management by telemedicine and the availability of in person appointments. The patient expressed understanding and agreed to proceed.  Follow Up Instructions:  I discussed the assessment and treatment plan with the patient. The patient was provided an opportunity to ask questions and all were answered. The patient agreed with the plan and demonstrated an understanding of the instructions.   The patient was advised to call back or seek an in-person evaluation if the symptoms worsen or if the condition fails to improve as anticipated.  I provided 15 minutes of non-face-to-face time during this encounter.  Yunique Dearcos E Demitris Pokorny, PA    11/06/2024 5:00 PM Karen [name]  MRN:  [id number]  Chief Complaint:  Chief Complaint  Patient presents with   Follow-up   Medication Refill   HPI:   Karen [name] is a 34 year old, Caucasian female with a past psychiatric history significant for chronic anxiety, bipolar 1 disorder, and chronic PTSD who presents to [facility] via virtual video visit for follow-up and medication management.  Patient is currently being managed on the following psychiatric medications:  Gabapentin  600 mg 3 times daily Lamotrigine  100 mg 2 times daily Abilify  20 mg daily Hydroxyzine  25 mg 3 times daily as needed Ropinirole  25 mg at bedtime  Patient reports that use of her medications have been going well.  She reports that her anxiety has improved but states that she has not been taking gabapentin  or hydroxyzine  as frequently.  She reports that she experiences occasional mood swings but states  that they have been tolerable.  Patient denies overt depressive symptoms.  She continues to endorse anxiety and rates her anxiety a 7 out of 10.  Patient attributes her anxiety to situational stressors stating that there is a lot going on.  Patient denies recent episodes of mania.  Patient reports no other issues or concerns at this time.  A PHQ-9 screen was performed with the patient scoring a 15.  A GAD-7 screen was also performed with the patient scoring a 17.  Patient is alert and oriented x4, calm, cooperative, and fully engaged in conversation during the encounter.  Patient describes her mood as stressed.  Patient exhibits anxious mood with congruent affect.  Patient denies suicidal or homicidal ideations.  She further denies auditory or visual hallucinations and does not appear to be responding to internal/external stimuli.  Patient endorses good sleep and receives on average 8 hours of sleep per night.  Patient endorses good appetite and eats on average 2-3 meals per day.  Patient denies alcohol consumption, tobacco use, or illicit drug use.  Visit Diagnosis:    ICD-10-CM   1. Restless leg syndrome  G25.81 rOPINIRole  (REQUIP ) 0.25 MG tablet    2. Bipolar 1 disorder (HCC)  F31.9 lamoTRIgine  (LAMICTAL ) 100 MG tablet    ARIPiprazole  (ABILIFY ) 20 MG tablet    3. Chronic anxiety  F41.9       Past Psychiatric History:  Bipolar disorder Generalized anxiety disorder Visual hallucinations Chronic PTSD  Past Medical History:  Past Medical History:  Diagnosis Date   Anxiety    Bipolar disorder (HCC)    Chronic UTI  Depression    Endometriosis    HSV (herpes simplex virus) anogenital infection    PTSD (post-traumatic stress disorder)     Past Surgical History:  Procedure Laterality Date   CESAREAN SECTION N/A 02/17/2018   Procedure: CESAREAN SECTION;  Surgeon: Bettina Muskrat, MD;  Location: [facility] SU[facility];  Service: Obstetrics;  Laterality: N/A;   COLONOSCOPY     DIAGNOSTIC  LAPAROSCOPY  11/05/2012   DILATION AND EVACUATION N/A 03/11/2015   Procedure: DILATATION AND EVACUATION;  Surgeon: Jerolyn Foil, MD;  Location: [facility] ORS;  Service: Gynecology;  Laterality: N/A;   DRUG INDUCED ENDOSCOPY     LAPAROSCOPY     LAPAROSCOPY N/A 06/07/2021   Procedure: LAPAROSCOPY OPERATIVE with /EXCISION OF LESIONS and lysis of adhesions;  Surgeon: Foil Jerolyn, MD;  Location: [facility] OR;  Service: Gynecology;  Laterality: N/A;   TONSILLECTOMY  11/05/2013    Family Psychiatric History:  Mother - Major Depressive Disorder Sister - Obsessive Compulsive Disorder Aunt (maternal) - Bipolar I Disorder Uncle (maternal) - Bipolar I Disorder  Family History:  Family History  Problem Relation Age of Onset   Rheum arthritis Mother    Cancer Father     Social History:  Social History   Socioeconomic History   Marital status: Married    Spouse name: Cyrena Kuchenbecker   Number of children: 1   Years of education: Not on file   Highest education level: Some college, no degree  Occupational History   Not on file  Tobacco Use   Smoking status: Every Day    Types: E-cigarettes   Smokeless tobacco: Never   Tobacco comments:    PT VAPES DAILY, '' I vape a lot ''   Vaping Use   Vaping status: Every Day   Substances: Nicotine , Flavoring  Substance and Sexual Activity   Alcohol use: Not Currently   Drug use: Not Currently    Types: Heroin, Cocaine, Marijuana    Comment: Clean since Aug 2015   Sexual activity: Yes    Partners: Male  Other Topics Concern   Not on file  Social History Narrative   Not on file   Social Drivers of Health   Tobacco Use: High Risk (12/01/2024)   Patient History    Smoking Tobacco Use: Every Day    Smokeless Tobacco Use: Never    Passive Exposure: Not on file  Financial Resource Strain: Medium Risk (12/01/2024)   Received from [facility]   Overall Financial Resource Strain (CARDIA)    How hard is it for you to pay for the very basics like food, housing,  medical care, and heating?: Somewhat hard  Food Insecurity: No Food Insecurity (12/01/2024)   Received from [facility]   Epic    Within the past 12 months, you worried that your food would run out before you got the money to buy more.: Never true    Within the past 12 months, the food you bought just didn't last and you didn't have money to get more.: Never true  Transportation Needs: No Transportation Needs (12/01/2024)   Received from [facility]    In the past 12 months, has lack of transportation kept you from medical appointments or from getting medications?: No    In the past 12 months, has lack of transportation kept you from meetings, work, or from getting things needed for daily living?: No  Physical Activity: Insufficiently Active (12/01/2024)   Received from [facility]   Exercise Vital Sign  On average, how many days per week do you engage in moderate to strenuous exercise (like a brisk walk)?: 1 day    On average, how many minutes do you engage in exercise at this level?: 30 min  Stress: Stress Concern Present (12/01/2024)   Received from [facility] of Occupational Health - Occupational Stress Questionnaire    Do you feel stress - tense, restless, nervous, or anxious, or unable to sleep at night because your mind is troubled all the time - these days?: To some extent  Social Connections: Socially Integrated (12/01/2024)   Received from [facility]   Social Network    How would you rate your social network (family, work, friends)?: Good participation with social networks  Depression (PHQ2-9): High Risk (11/06/2024)   Depression (PHQ2-9)    PHQ-2 Score: 15  Alcohol Screen: Low Risk (07/03/2022)   Alcohol Screen    Last Alcohol Screening Score (AUDIT): 0  Housing: Low Risk (12/01/2024)   Received from [facility]    In the last 12 months, was there a time when you were not able to pay the mortgage or rent on time?: No    In the past  12 months, how many times have you moved where you were living?: 1    At any time in the past 12 months, were you homeless or living in a shelter (including now)?: No  Utilities: Not At Risk (12/01/2024)   Received from [facility]    In the past 12 months has the electric, gas, oil, or water company threatened to shut off services in your home?: No  Health Literacy: Adequate Health Literacy (06/20/2023)   B1300 Health Literacy    Frequency of need for help with medical instructions: Never    Allergies:  Allergies  Allergen Reactions   Ciprofloxacin Hives   Wound Dressing Adhesive Rash    Metabolic Disorder Labs: Lab Results  Component Value Date   HGBA1C 4.6 (L) 07/02/2022   MPG 85.32 07/02/2022   MPG 85.32 09/11/2020   No results found for: PROLACTIN Lab Results  Component Value Date   CHOL 117 07/02/2022   TRIG 94 07/02/2022   HDL 29 (L) 07/02/2022   CHOLHDL 4.0 07/02/2022   VLDL 19 07/02/2022   LDLCALC 69 07/02/2022   LDLCALC 78 05/09/2021   Lab Results  Component Value Date   TSH 0.519 07/02/2022   TSH 0.784 09/11/2020    Therapeutic Level Labs: Lab Results  Component Value Date   LITHIUM  0.27 (L) 07/06/2022   No results found for: VALPROATE No results found for: CBMZ  Current Medications: Current Outpatient Medications  Medication Sig Dispense Refill   acetaminophen  (TYLENOL ) 325 MG tablet Take 2 tablets (650 mg total) by mouth every 6 (six) hours as needed for mild pain or moderate pain. 30 tablet 1   ARIPiprazole  (ABILIFY ) 20 MG tablet Take 1 tablet (20 mg total) by mouth daily. 30 tablet 1   ARIPiprazole  (ABILIFY ) 5 MG tablet Take 1 tablet (5 mg total) by mouth daily for 6 days, THEN 2 tablets (10 mg total) daily for 6 days. 18 tablet 0   benztropine  (COGENTIN ) 0.5 MG tablet TAKE 1 TABLET BY MOUTH EVERY DAY 90 tablet 1   clonazePAM  (KLONOPIN ) 0.5 MG tablet Take 1 tablet (0.5 mg total) by mouth daily as needed for anxiety. 30 tablet 0    escitalopram  (LEXAPRO ) 10 MG tablet Take 1 tablet (10 mg total)  by mouth daily. 30 tablet 1   gabapentin  (NEURONTIN ) 300 MG capsule Take 2 capsules (600 mg total) by mouth 3 (three) times daily. 180 capsule 1   hydrOXYzine  (ATARAX ) 25 MG tablet Take 1 tablet (25 mg total) by mouth 3 (three) times daily as needed for anxiety. 75 tablet 1   lamoTRIgine  (LAMICTAL ) 100 MG tablet Take 1 tablet (100 mg total) by mouth 2 (two) times daily. 60 tablet 1   medroxyPROGESTERone  (DEPO-PROVERA ) 150 MG/ML injection Inject 150 mg into the muscle every 3 (three) months. Next dose due 07/03/22     Multiple Vitamins-Minerals (MULTIVITAMIN WITH MINERALS) tablet Take 1 tablet by mouth daily.     nicotine  polacrilex (NICORETTE ) 2 MG gum Take 1 each (2 mg total) by mouth as needed for smoking cessation. 100 tablet 0   ondansetron  (ZOFRAN ) 4 MG tablet Take 1 tablet (4 mg total) by mouth as needed for nausea or vomiting. 30 tablet 0   promethazine  (PHENERGAN ) 25 MG tablet Take 1 tablet (25 mg total) by mouth every 6 (six) hours as needed for nausea or vomiting. 20 tablet 0   ramelteon  (ROZEREM ) 8 MG tablet Take 1 tablet (8 mg total) by mouth at bedtime. 30 tablet 1   rOPINIRole  (REQUIP ) 0.25 MG tablet Take 1 tablet (0.25 mg total) by mouth every evening. 30 tablet 1   No current facility-administered medications for this visit.     Musculoskeletal: Strength & Muscle Tone: within normal limits Gait & Station: normal Patient leans: N/A  Psychiatric Specialty Exam: Review of Systems  Psychiatric/Behavioral:  Negative for decreased concentration, dysphoric mood, hallucinations, self-injury, sleep disturbance and suicidal ideas. The patient is nervous/anxious. The patient is not hyperactive.     There were no vitals taken for this visit.There is no height or weight on file to calculate BMI.  General Appearance: Casual  Eye Contact:  Good  Speech:  Clear and Coherent and Normal Rate  Volume:  Normal  Mood:  Euthymic   Affect:  Appropriate  Thought Process:  Coherent, Goal Directed, and Descriptions of Associations: Intact  Orientation:  Full (Time, Place, and Person)  Thought Content: WDL   Suicidal Thoughts:  No  Homicidal Thoughts:  No  Memory:  Immediate;   Good Recent;   Good Remote;   Good  Judgement:  Good  Insight:  Good  Psychomotor Activity:  Normal  Concentration:  Concentration: Good and Attention Span: Good  Recall:  Good  Fund of Knowledge: Good  Language: Good  Akathisia:  Yes  Handed:  Ambidextrous  AIMS (if indicated): not done  Assets:  Communication Skills Desire for Improvement Housing Social Support Vocational/Educational  ADL's:  Intact  Cognition: WNL  Sleep:  Good   Screenings: AIMS    Flowsheet Row Video Visit from 11/06/2024 in [facility] Video Visit from 09/25/2024 in [facility] Video Visit from 08/07/2024 in [facility] Video Visit from 06/26/2024 in [facility] Video Visit from 05/01/2024 in [facility]  AIMS Total Score 2 1 6 6 8    AUDIT    Flowsheet Row Ad[facility] (Discharged) [facility] (Discharged) from [facility] Ad[facility] (Discharged[facility] from [facility]  Alcohol Use Disorder Identification Test Final Score (AUDIT) 0 0 0   GAD-7  Flowsheet Row Video Visit from 11/06/2024 in [facility] Video Visit from 09/25/2024 in [facility] Video Visit from 08/07/2024 in [facility] Video Visit from 06/26/2024 in [facility] Video Visit from 05/01/2024 in [facility]  Total GAD-7 Score 17 11 15  21 16    PHQ2-9    Flowsheet Row Video Visit from 11/06/2024 in [facility] Video Visit from 09/25/2024 in [facility] Video Visit from 08/07/2024 in [facility] Video Visit from 06/26/2024 in [facility] Video Visit from 05/01/2024 in [facility]  PHQ-2 Total Score 2 2 5 2 2   PHQ-9 Total Score 15 4 20 8 10    Flowsheet Row Video Visit from 11/06/2024 in [facility] Video Visit from 09/25/2024 in [facility] Video Visit from 08/07/2024 in [facility]  C-SSRS RISK CATEGORY Moderate Risk Moderate Risk Moderate Risk     Assessment and Plan:   Karen [name] is a 34 year old, Caucasian female with a past psychiatric history significant for chronic anxiety, bipolar 1 disorder, and chronic PTSD who presents to [facility] via virtual video visit for follow-up and medication management.  Patient presents to the encounter stating that she has been tolerating her medications well; however, she reports that she has not been taking her gabapentin  or hydroxyzine  as frequently.  Patient denies overt depressive symptoms but she does continue to endorse elevated anxiety attributed to situational stressors in her life. A PHQ-9 screen was performed with the patient scoring a 15.  A GAD-7 screen was also performed with the patient scoring a 17.  Despite her elevated anxiety, patient would like to continue taking her Abilify , ropinirole , and lamotrigine .  She would like to discontinue taking gabapentin  and hydroxyzine  at this time.  Patient endorses stability on her current medication regimen.  Patient's medications to be e-prescribed to pharmacy of choice.  Patient informed provider that her primary care provider obtain labs from her as well as got an  EKG on her.   A [place] Suicide Severity Rating Scale was performed with the patient being considered moderate risk.  Patient denies suicidal ideations and is able to contract for safety at this time.  Safety planning was discussed with the patient prior to the conclusion of the encounter.  - Patient was instructed to contact 911 in the event of a mental health crisis. - Patient was instructed to contact [employer] in the event of a mental health crisis. - Patient was instructed to present to [facility] in the event of a mental health crisis.  Collaboration of Care: Collaboration of Care: Medication Management AEB provider managing patient's psychiatric medications, Psychiatrist AEB patient being followed by mental health provider at this facility, and Referral or follow-up with counselor/therapist AEB patient being seen by a licensed clinical social worker at this facility  Patient/Guardian was advised Release of Information must be obtained prior to any record release in order to collaborate their care with an outside provider. Patient/Guardian was advised if they have not already done so to contact the registration department to sign all necessary forms in order for us  to release information regarding their care.   Consent: Patient/Guardian gives verbal consent for treatment and assignment of benefits for services provided during this visit.  Patient/Guardian expressed understanding and agreed to proceed.   1. Restless leg syndrome  - rOPINIRole  (REQUIP ) 0.25 MG tablet; Take 1 tablet (0.25 mg total) by mouth every evening.  Dispense: 30 tablet; Refill: 1  2. Bipolar 1 disorder (HCC)  - lamoTRIgine  (LAMICTAL ) 100 MG tablet; Take 1 tablet (100 mg total) by mouth 2 (two) times daily.  Dispense: 60 tablet; Refill: 1 - ARIPiprazole  (ABILIFY ) 20 MG tablet; Take 1 tablet (20 mg total) by mouth daily.  Dispense: 30 tablet; Refill: 1  3. Chronic  anxiety  Patient to follow up in 7 weeks Provider spent a total of 15 minute with the patient/reviewing the patient's chart  [name], PA 11/06/2024, 4:30 PM

## 2024-12-02 ENCOUNTER — Ambulatory Visit (HOSPITAL_COMMUNITY): Admitting: Licensed Clinical Social Worker

## 2024-12-02 DIAGNOSIS — F411 Generalized anxiety disorder: Secondary | ICD-10-CM

## 2024-12-02 DIAGNOSIS — F319 Bipolar disorder, unspecified: Secondary | ICD-10-CM
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
## 2024-12-16 ENCOUNTER — Ambulatory Visit (HOSPITAL_COMMUNITY): Admitting: Licensed Clinical Social Worker

## 2024-12-30 ENCOUNTER — Ambulatory Visit (HOSPITAL_COMMUNITY): Admitting: Licensed Clinical Social Worker

## 2025-01-01 ENCOUNTER — Telehealth (HOSPITAL_COMMUNITY): Admitting: Physician Assistant

## 2025-01-06 ENCOUNTER — Ambulatory Visit (HOSPITAL_COMMUNITY): Admitting: Licensed Clinical Social Worker
